# Patient Record
Sex: Male | Born: 1960
Health system: Southern US, Community
[De-identification: ages and names within clinical notes are randomized; demographics above are authoritative.]

## PROBLEM LIST (undated history)

## (undated) DIAGNOSIS — K219 Gastro-esophageal reflux disease without esophagitis: Secondary | ICD-10-CM

## (undated) DIAGNOSIS — M654 Radial styloid tenosynovitis [de Quervain]: Secondary | ICD-10-CM

## (undated) DIAGNOSIS — G43909 Migraine, unspecified, not intractable, without status migrainosus: Secondary | ICD-10-CM

## (undated) DIAGNOSIS — I739 Peripheral vascular disease, unspecified: Secondary | ICD-10-CM

## (undated) DIAGNOSIS — E785 Hyperlipidemia, unspecified: Secondary | ICD-10-CM

## (undated) DIAGNOSIS — N529 Male erectile dysfunction, unspecified: Secondary | ICD-10-CM

## (undated) DIAGNOSIS — I10 Essential (primary) hypertension: Secondary | ICD-10-CM

## (undated) DIAGNOSIS — S42302A Unspecified fracture of shaft of humerus, left arm, initial encounter for closed fracture: Secondary | ICD-10-CM

## (undated) DIAGNOSIS — G4730 Sleep apnea, unspecified: Secondary | ICD-10-CM

## (undated) HISTORY — DX: Unspecified fracture of shaft of humerus, left arm, initial encounter for closed fracture: S42.302A

## (undated) HISTORY — DX: Male erectile dysfunction, unspecified: N52.9

## (undated) HISTORY — DX: Migraine, unspecified, not intractable, without status migrainosus: G43.909

## (undated) HISTORY — DX: Hyperlipidemia, unspecified: E78.5

## (undated) HISTORY — DX: Sleep apnea, unspecified: G47.30

## (undated) HISTORY — DX: Radial styloid tenosynovitis (de quervain): M65.4

## (undated) HISTORY — PX: FRACTURE SURGERY: SHX138

## (undated) HISTORY — DX: Essential (primary) hypertension: I10

---

## 1998-08-11 ENCOUNTER — Encounter: Payer: Self-pay | Admitting: Internal Medicine

## 2002-04-07 HISTORY — PX: CARPAL TUNNEL RELEASE: SHX101

## 2002-08-07 HISTORY — PX: WRIST SURGERY: SHX841

## 2004-05-20 ENCOUNTER — Other Ambulatory Visit: Payer: Self-pay

## 2004-05-23 ENCOUNTER — Ambulatory Visit: Payer: Self-pay | Admitting: Orthopaedic Surgery

## 2004-06-13 ENCOUNTER — Ambulatory Visit: Payer: Self-pay | Admitting: Internal Medicine

## 2004-08-10 ENCOUNTER — Ambulatory Visit: Payer: Self-pay | Admitting: Family Medicine

## 2004-08-23 ENCOUNTER — Ambulatory Visit: Payer: Self-pay | Admitting: Internal Medicine

## 2004-08-24 ENCOUNTER — Ambulatory Visit: Payer: Self-pay | Admitting: Internal Medicine

## 2004-12-05 HISTORY — PX: OTHER SURGICAL HISTORY: SHX169

## 2004-12-09 ENCOUNTER — Ambulatory Visit: Payer: Self-pay | Admitting: Internal Medicine

## 2004-12-12 ENCOUNTER — Ambulatory Visit: Payer: Self-pay

## 2005-05-17 ENCOUNTER — Ambulatory Visit: Payer: Self-pay | Admitting: Internal Medicine

## 2005-05-30 ENCOUNTER — Ambulatory Visit: Payer: Self-pay | Admitting: Internal Medicine

## 2005-09-19 ENCOUNTER — Ambulatory Visit: Payer: Self-pay | Admitting: Family Medicine

## 2005-12-14 ENCOUNTER — Ambulatory Visit: Payer: Self-pay | Admitting: Internal Medicine

## 2005-12-22 ENCOUNTER — Encounter: Payer: Self-pay | Admitting: Internal Medicine

## 2006-08-07 HISTORY — PX: WRIST SURGERY: SHX841

## 2006-08-28 ENCOUNTER — Emergency Department: Payer: Self-pay | Admitting: Emergency Medicine

## 2006-08-29 ENCOUNTER — Ambulatory Visit: Payer: Self-pay | Admitting: Orthopaedic Surgery

## 2006-08-29 ENCOUNTER — Other Ambulatory Visit: Payer: Self-pay

## 2006-08-30 ENCOUNTER — Ambulatory Visit: Payer: Self-pay | Admitting: Orthopaedic Surgery

## 2006-10-17 ENCOUNTER — Inpatient Hospital Stay (HOSPITAL_COMMUNITY): Admission: RE | Admit: 2006-10-17 | Discharge: 2006-10-20 | Payer: Self-pay | Admitting: Orthopedic Surgery

## 2006-12-12 ENCOUNTER — Encounter: Payer: Self-pay | Admitting: Internal Medicine

## 2007-02-06 ENCOUNTER — Encounter: Payer: Self-pay | Admitting: Internal Medicine

## 2007-02-15 ENCOUNTER — Telehealth (INDEPENDENT_AMBULATORY_CARE_PROVIDER_SITE_OTHER): Payer: Self-pay | Admitting: *Deleted

## 2007-02-15 ENCOUNTER — Ambulatory Visit: Payer: Self-pay | Admitting: Internal Medicine

## 2007-02-15 DIAGNOSIS — J019 Acute sinusitis, unspecified: Secondary | ICD-10-CM

## 2007-03-01 ENCOUNTER — Encounter: Payer: Self-pay | Admitting: Internal Medicine

## 2007-05-15 ENCOUNTER — Ambulatory Visit: Payer: Self-pay | Admitting: Internal Medicine

## 2007-05-20 ENCOUNTER — Telehealth (INDEPENDENT_AMBULATORY_CARE_PROVIDER_SITE_OTHER): Payer: Self-pay | Admitting: *Deleted

## 2007-05-20 DIAGNOSIS — E785 Hyperlipidemia, unspecified: Secondary | ICD-10-CM | POA: Insufficient documentation

## 2007-05-20 DIAGNOSIS — J309 Allergic rhinitis, unspecified: Secondary | ICD-10-CM | POA: Insufficient documentation

## 2007-05-20 DIAGNOSIS — I10 Essential (primary) hypertension: Secondary | ICD-10-CM | POA: Insufficient documentation

## 2007-05-20 DIAGNOSIS — G43909 Migraine, unspecified, not intractable, without status migrainosus: Secondary | ICD-10-CM | POA: Insufficient documentation

## 2007-05-20 DIAGNOSIS — G4733 Obstructive sleep apnea (adult) (pediatric): Secondary | ICD-10-CM | POA: Insufficient documentation

## 2007-06-07 ENCOUNTER — Ambulatory Visit: Payer: Self-pay | Admitting: Internal Medicine

## 2007-06-07 DIAGNOSIS — N529 Male erectile dysfunction, unspecified: Secondary | ICD-10-CM

## 2007-06-12 ENCOUNTER — Ambulatory Visit: Payer: Self-pay | Admitting: Internal Medicine

## 2007-06-12 ENCOUNTER — Telehealth: Payer: Self-pay | Admitting: Internal Medicine

## 2007-06-12 DIAGNOSIS — R7989 Other specified abnormal findings of blood chemistry: Secondary | ICD-10-CM | POA: Insufficient documentation

## 2007-06-13 ENCOUNTER — Encounter: Payer: Self-pay | Admitting: Internal Medicine

## 2007-06-19 ENCOUNTER — Ambulatory Visit: Payer: Self-pay | Admitting: Internal Medicine

## 2007-06-27 ENCOUNTER — Telehealth: Payer: Self-pay | Admitting: Internal Medicine

## 2007-06-27 ENCOUNTER — Encounter: Payer: Self-pay | Admitting: Internal Medicine

## 2007-06-27 ENCOUNTER — Ambulatory Visit: Payer: Self-pay | Admitting: Internal Medicine

## 2007-07-01 ENCOUNTER — Telehealth (INDEPENDENT_AMBULATORY_CARE_PROVIDER_SITE_OTHER): Payer: Self-pay | Admitting: *Deleted

## 2007-07-11 ENCOUNTER — Telehealth (INDEPENDENT_AMBULATORY_CARE_PROVIDER_SITE_OTHER): Payer: Self-pay | Admitting: *Deleted

## 2007-08-09 ENCOUNTER — Encounter: Payer: Self-pay | Admitting: Internal Medicine

## 2007-10-23 ENCOUNTER — Ambulatory Visit: Payer: Self-pay | Admitting: Internal Medicine

## 2008-03-02 ENCOUNTER — Telehealth (INDEPENDENT_AMBULATORY_CARE_PROVIDER_SITE_OTHER): Payer: Self-pay | Admitting: *Deleted

## 2008-09-25 ENCOUNTER — Telehealth: Payer: Self-pay | Admitting: Family Medicine

## 2008-09-29 ENCOUNTER — Telehealth: Payer: Self-pay | Admitting: Internal Medicine

## 2008-09-29 ENCOUNTER — Ambulatory Visit: Payer: Self-pay | Admitting: Internal Medicine

## 2008-11-18 ENCOUNTER — Encounter: Payer: Self-pay | Admitting: Internal Medicine

## 2008-12-14 ENCOUNTER — Ambulatory Visit: Payer: Self-pay | Admitting: Internal Medicine

## 2008-12-16 ENCOUNTER — Telehealth: Payer: Self-pay | Admitting: Internal Medicine

## 2008-12-30 ENCOUNTER — Telehealth: Payer: Self-pay | Admitting: Internal Medicine

## 2009-08-24 ENCOUNTER — Telehealth: Payer: Self-pay | Admitting: Internal Medicine

## 2009-09-03 ENCOUNTER — Telehealth: Payer: Self-pay | Admitting: Internal Medicine

## 2009-09-03 ENCOUNTER — Ambulatory Visit: Payer: Self-pay | Admitting: Internal Medicine

## 2009-09-23 ENCOUNTER — Telehealth: Payer: Self-pay | Admitting: Internal Medicine

## 2010-01-11 ENCOUNTER — Ambulatory Visit: Payer: Self-pay | Admitting: Family Medicine

## 2010-01-26 ENCOUNTER — Telehealth: Payer: Self-pay | Admitting: Internal Medicine

## 2010-05-12 ENCOUNTER — Ambulatory Visit: Payer: Self-pay | Admitting: Internal Medicine

## 2010-05-12 LAB — HM DIABETES FOOT EXAM

## 2010-08-31 ENCOUNTER — Encounter: Payer: Self-pay | Admitting: Internal Medicine

## 2010-08-31 ENCOUNTER — Ambulatory Visit
Admission: RE | Admit: 2010-08-31 | Discharge: 2010-08-31 | Payer: Self-pay | Source: Home / Self Care | Attending: Internal Medicine | Admitting: Internal Medicine

## 2010-08-31 DIAGNOSIS — E291 Testicular hypofunction: Secondary | ICD-10-CM | POA: Insufficient documentation

## 2010-09-05 ENCOUNTER — Ambulatory Visit
Admission: RE | Admit: 2010-09-05 | Discharge: 2010-09-05 | Payer: Self-pay | Source: Home / Self Care | Attending: Family Medicine | Admitting: Family Medicine

## 2010-09-06 ENCOUNTER — Telehealth: Payer: Self-pay | Admitting: Family Medicine

## 2010-09-07 NOTE — Progress Notes (Signed)
Summary: cough, head and chest congestion  Phone Note Call from Patient Call back at [phone] c or pt's [phone]   Caller: Spouse Call For: [name] MD Summary of Call: Started 08/31/09 with productive cough with yellow phlegm, head and chest congestion. Pt cannot sleep at night due to congestion and cough. No shortness of breath, no fever, no wheezing, no sorethroat or earache. Been taking Mucines and OTC sinus and cold med which does not seem to be helping. Pt is also using cool air humidifier. Pt uses [facility] [phone]. Pt made appt to see Dr Dayton Martes on Mon 09/06/09@ 9:00am. Pt to call back if symptoms worsen or change prior to appt and sending note to Dr. Alphonsus Sias to see if any other suggestions until pt seen on 09/06/09. If pt does not hear back pt will keep appt on 09/06/09.Please advise.  Initial call taken by: Lewanda Rife LPN,  September 03, 2009 12:32 PM  Follow-up for Phone Call        can't phone in antibiotics Okay to have him come in at [facility] today if she doesn't think he can wait Follow-up by: [name] MD,  September 03, 2009 1:53 PM  Additional Follow-up for Phone Call Additional follow up Details #1::        pt coming at 3:00 Additional Follow-up by: Mervin Hack CMA Duncan Dull),  September 03, 2009 2:12 PM

## 2010-09-07 NOTE — Assessment & Plan Note (Signed)
Summary: TICK BITE   Vital Signs:  Patient profile:   50 year old male Height:      68 inches Weight:      208.6 pounds BMI:     31.83 Temp:     98.3 degrees F oral Pulse rate:   100 / minute Pulse rhythm:   regular BP sitting:   120 / 76  (left arm) Cuff size:   large  Vitals Entered By: Benny Lennert CMA Duncan Dull) (January 11, 2010 8:05 AM)  History of Present Illness: Chief complaint tick bite   5 days ago noted tick on right shoulder blade. Not sure how long it was attached...Marland Kitchenat least over night. Not sure what kind. Area remains red, gradually improveing.  No fever, no joint pain, no neck stiffness, no headache.  No rash.  Problems Prior to Update: 1)  Diabetes Mellitus, Type II  (ICD-250.00) 2)  Other Abnormal Blood Chemistry  (ICD-790.6) 3)  Erectile Dysfunction, Organic  (ICD-607.84) 4)  Preventive Health Care  (ICD-V70.0) 5)  Sleep Apnea  (ICD-780.57) 6)  Migraine Headache  (ICD-346.90) 7)  Hypertension  (ICD-401.9) 8)  Hyperlipidemia  (ICD-272.4) 9)  Allergic Rhinitis  (ICD-477.9) 10)  Sinusitis, Acute  (ICD-461.9)  Current Medications (verified): 1)  Lisinopril 20 Mg  Tabs (Lisinopril) .... Take 1 Tablet By Mouth Once A Day 2)  Maxalt 10 Mg  Tabs (Rizatriptan Benzoate) .... Prn 3)  Metformin Hcl 1000 Mg  Tabs (Metformin Hcl) .... Two Times A Day 4)  Onetouch Ultra Test   Strp (Glucose Blood) .... Use To Test Blood Sugar Bid 5)  Onetouch Ultrasoft Lancets   Misc (Lancets) .... Use To Test Blood Sugar Bid 6)  Alcohol Wipes 70 %  Pads (Alcohol Swabs) .... Use Daily As Directed When Checking Bs 7)  Cialis 20 Mg Tabs (Tadalafil) .... 1/2 - 1 Tab About 1 Hour Before Sex 8)  Glipizide Xl 5 Mg Xr24h-Tab (Glipizide) .... Take 1 Tablet By Mouth Once Daily  Allergies: 1)  ! Ziac (Bisoprolol-Hydrochlorothiazide)  Past History:  Past medical, surgical, family and social histories (including risk factors) reviewed, and no changes noted (except as noted below).  Past  Medical History: Reviewed history from 06/19/2007 and no changes required. Allergic rhinitis Hyperlipidemia Hypertension Migraines Sleep apnea Erectile dysfunction Diabetes mellitus, type II  Past Surgical History: Reviewed history from 05/20/2007 and no changes required. Left arm fx as a child DeQuervain's release - right wrist 01/04 Left carpal tunnel realse 09/03 Cardiolite negative EF 66%, 05/06 ORIF left wrist DVR plate screwhead autologous 01/08  Family History: Reviewed history from 06/07/2007 and no changes required. HTN in Dad and Carmelina Dane Dad died of Non-Hodgkin's lymphoma Pat aunt deid of lung cancer CAD in pat uncles Colon cancer in 1 relative (?aunt) Mat GM died of uterine cancer  Social History: Reviewed history from 05/20/2007 and no changes required. Occupation: Database administrator Married Never Smoked Alcohol use-rare  Review of Systems General:  Denies fatigue and fever. CV:  Denies chest pain or discomfort. Resp:  Denies shortness of breath. GI:  Denies abdominal pain. GU:  Denies dysuria.  Physical Exam  General:  Well-developed,well-nourished,in no acute distress; alert,appropriate and cooperative throughout examination Mouth:  Oral mucosa and oropharynx without lesions or exudates.  Teeth in good repair. Neck:  no carotid bruit or thyromegaly no cervical or supraclavicular lymphadenopathy  Lungs:  Normal respiratory effort, chest expands symmetrically. Lungs are clear to auscultation, no crackles or wheezes. Heart:  Normal rate and  regular rhythm. S1 and S2 normal without gallop, murmur, click, rub or other extra sounds. Abdomen:  Bowel sounds positive,abdomen soft and non-tender without masses, organomegaly or hernias noted. Skin:  mild erytthema aorund healing bite..no pus. No other rash, no bulls eye lesion   Impression & Recommendations:  Problem # 1:  TICK BITE (ICD-E906.4) Expected allergic response and local inflammatory  reaction...no suggestion of infection in this diabeic. No s/s of tick borne illness. Reviewed s/s..for pt to be aware of..return if any occur.   Complete Medication List: 1)  Lisinopril 20 Mg Tabs (Lisinopril) .... Take 1 tablet by mouth once a day 2)  Maxalt 10 Mg Tabs (Rizatriptan benzoate) .... Prn 3)  Metformin Hcl 1000 Mg Tabs (Metformin hcl) .... Two times a day 4)  Onetouch Ultra Test Strp (Glucose blood) .... Use to test blood sugar bid 5)  Onetouch Ultrasoft Lancets Misc (Lancets) .... Use to test blood sugar bid 6)  Alcohol Wipes 70 % Pads (Alcohol swabs) .... Use daily as directed when checking bs 7)  Cialis 20 Mg Tabs (Tadalafil) .... 1/2 - 1 tab about 1 hour before sex 8)  Glipizide Xl 5 Mg Xr24h-tab (Glipizide) .... Take 1 tablet by mouth once daily  Current Allergies (reviewed today): ! ZIAC (BISOPROLOL-HYDROCHLOROTHIAZIDE)

## 2010-09-07 NOTE — Progress Notes (Signed)
Summary: Needs appt for refills  Phone Note Outgoing Call Call back at Home Phone [phone]   Call placed by: DeShannon Katrinka Blazing CMA Duncan Dull),  January 26, 2010 9:34 AM Call placed to: Patient Summary of Call: calling pt to advise that he needs appt for refills. Per last office visit, pt should have followed up in . Initial call taken by: Mervin Hack CMA Duncan Dull),  January 26, 2010 9:34 AM  Follow-up for Phone Call        left message on machine that pt needs appt for refills. Follow-up by: Mervin Hack CMA Duncan Dull),  January 26, 2010 9:35 AM

## 2010-09-07 NOTE — Assessment & Plan Note (Signed)
Summary: cold/ds   Vital Signs:  Patient profile:   50 year old male Weight:      210 pounds O2 Sat:      96 % on Room air Temp:     98.4 degrees F oral Pulse rate:   108 / minute Pulse rhythm:   regular Resp:     18 per minute BP sitting:   120 / 80  (left arm) Cuff size:   large  Vitals Entered By: Mervin Hack CMA Duncan Dull) (September 03, 2009 3:13 PM)  O2 Flow:  Room air CC: cold/ congestion   History of Present Illness: has been sick for about 3-4 days Quickly worsening Bad nasal congestion and drainage Lots of cough productive of clear to yellow phlegm Sneezing  No clear fever Feels bad and hard time even thinking No SOB No sore throat  Has head pressure No sig ear pain  Had flu shot no sig aches  Allergies: 1)  ! Ziac (Bisoprolol-Hydrochlorothiazide)  Past History:  Past medical, surgical, family and social histories (including risk factors) reviewed for relevance to current acute and chronic problems.  Past Medical History: Reviewed history from 06/19/2007 and no changes required. Allergic rhinitis Hyperlipidemia Hypertension Migraines Sleep apnea Erectile dysfunction Diabetes mellitus, type II  Past Surgical History: Reviewed history from 05/20/2007 and no changes required. Left arm fx as a child DeQuervain's release - right wrist 01/04 Left carpal tunnel realse 09/03 Cardiolite negative EF 66%, 05/06 ORIF left wrist DVR plate screwhead autologous 01/08  Family History: Reviewed history from 06/07/2007 and no changes required. HTN in Dad and Carmelina Dane Dad died of Non-Hodgkin's lymphoma Pat aunt deid of lung cancer CAD in pat uncles Colon cancer in 1 relative (?aunt) Mat GM died of uterine cancer  Social History: Reviewed history from 05/20/2007 and no changes required. 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  Looks uncomfortable but NAD Head:  no frontal or maxillary tenderness Ears:  R ear normal and L ear normal.   Nose:  marked congestion on right, mod inflammation on left Mouth:  no erythema and no exudates.   Neck:  supple, no masses, and no cervical lymphadenopathy.   Lungs:  normal respiratory effort, no intercostal retractions, no accessory muscle use, and normal breath sounds.     Impression & Recommendations:  Problem # 1:  SINUSITIS, ACUTE (ICD-461.9) Assessment Comment Only  Has sinus infection but likely viral will continue analgesics tramadol for cough start amoxil if worsens  His updated medication list for this problem includes:    Amoxicillin 500 Mg Tabs (Amoxicillin) .Marland Kitchen... 2 tabs by mouth two times a day for sinus infection  Complete Medication List: 1)  Lisinopril 20 Mg Tabs (Lisinopril) .... Take 1 tablet by mouth once a day 2)  Maxalt 10 Mg Tabs (Rizatriptan benzoate) .... Prn 3)  Metformin Hcl 1000 Mg Tabs (Metformin hcl) .... Two times a day 4)  Onetouch Ultra Test Strp (Glucose blood) .... Use to test blood sugar bid 5)  Onetouch Ultrasoft Lancets Misc (Lancets) .... Use to test blood sugar bid 6)  Alcohol Wipes 70 % Pads (Alcohol swabs) .... Use daily as directed when checking bs 7)  Cialis 20 Mg Tabs (Tadalafil) .... 1/2 - 1 tab about 1 hour before sex 8)  Glipizide  Xl 5 Mg Xr24h-tab (Glipizide) .... Take 1 tablet by mouth once daily 9)  Tramadol Hcl 50 Mg Tabs (Tramadol hcl) .Marland Kitchen.. 1 tab by mouth three times a day as needed for severe cough 10)  Amoxicillin 500 Mg Tabs (Amoxicillin) .... 2 tabs by mouth two times a day for sinus infection  Patient Instructions: 1)  Keep regular follow up 2)  Please start the amoxicillin if more nasty nasal drainage or you are worsening overall Prescriptions: AMOXICILLIN 500 MG TABS (AMOXICILLIN) 2 tabs by mouth two times a day for sinus infection  #40 x 0   Entered  and Authorized by:   [name] MD   Signed by:   [name] MD on 09/03/2009   Method used:   Print then Give to Patient   RxID:   [id number] TRAMADOL HCL 50 MG TABS (TRAMADOL HCL) 1 tab by mouth three times a day as needed for severe cough  #40 x 0   Entered and Authorized by:   [name] MD   Signed by:   [name] MD on 09/03/2009   Method used:   Electronically to        [facility] [address] (retail)       [address], [address]       [place], [place]  [address]       Ph: [phone]       Fax: [phone]   RxID:   [id number]   Current Allergies (reviewed today): ! ZIAC (BISOPROLOL-HYDROCHLOROTHIAZIDE)

## 2010-09-07 NOTE — Progress Notes (Signed)
Summary: MAXALT  Phone Note Refill Request Message from:  [facility] [phone] [facility][id number] on September 23, 2009 3:41 PM  Refills Requested: Medication #1:  MAXALT 10 MG  TABS prn E-Scribe Request no last refill date sent   Method Requested: Electronic Initial call taken by: Mervin Hack CMA Duncan Dull),  September 23, 2009 3:41 PM  Follow-up for Phone Call        okay #12  x 3 Follow-up by: [name] MD,  September 23, 2009 5:36 PM  Additional Follow-up for Phone Call Additional follow up Details #1::        Rx faxed to pharmacy Additional Follow-up by: DeShannon Smith CMA Duncan Dull),  September 24, 2009 8:02 AM    Prescriptions: MAXALT 10 MG  TABS (RIZATRIPTAN BENZOATE) prn  #12 Tablet x 3   Entered by:   Mervin Hack CMA (AAMA)   Authorized by:   [name] MD   Signed by:   Mervin Hack CMA (AAMA) on 09/24/2009   Method used:   [facility]ronically to        [phone][facility] (retail)       [address] [place], [place]  [address]       Ph: [phone] or [phone]       Fax: [phone]   RxID:   [id number]

## 2010-09-07 NOTE — Progress Notes (Signed)
Summary: needs written scripsts  Phone Note Refill Request Call back at Home Phone [phone] Message from:  Patient on August 24, 2009 11:44 AM  Refills Requested: Medication #1:  METFORMIN HCL 1000 MG  TABS two times a day  Medication #2:  LISINOPRIL 20 MG  TABS Take 1 tablet by mouth once a day  Medication #3:  GLIPIZIDE XL 5 MG XR24H-TAB take 1 tablet by mouth once daily. Patient needs 90 day written prescriptions for his mail order pharmacy. Please call patient when ready for pick up.   Method Requested: Pick up at Office Initial call taken by: Melody Comas,  August 24, 2009 11:46 AM Caller: Patient Call For: [name] MD  Follow-up for Phone Call        left message on machine for patient to return call. Per Dr. Alphonsus Sias pt needs to schedule follow-up appt. Pt can have 1 refill enough to last until appt. DeShannon Smith CMA Duncan Dull)  August 24, 2009 4:44 PM   spoke with patient and advised he needs follow-up appt and pt states he will call back and schedule. DeShannon Smith CMA Duncan Dull)  August 26, 2009 5:08 PM

## 2010-09-07 NOTE — Assessment & Plan Note (Signed)
Summary: CPX/CLE   Vital Signs:  Patient profile:   50 year old male Height:      66 inches Weight:      208 pounds Temp:     98.2 degrees F oral Pulse rate:   76 / minute Pulse rhythm:   regular BP sitting:   100 / 70  (left arm) Cuff size:   large  Vitals Entered By: Mervin Hack CMA Duncan Dull) (May 12, 2010 2:08 PM) CC: adult physical   History of Present Illness: Here for physical  concerned that his A1c is back up again Did have it done at work after starting glipizide and it was better (he doesn't remember the numbers) Fasting sugars creeping up to 160-180 No other checks No hypoglycemic reactions  Compliance with diet is not great but not that bad, he feels Very little exercise--- he isn't motivated for this discussed the probablility that he will need more meds if he doesn't change  overall, he feels good disturbed by ED---cialis not helping much  Allergies: 1)  ! Ziac (Bisoprolol-Hydrochlorothiazide)  Past History:  Past medical, surgical, family and social histories (including risk factors) reviewed for relevance to current acute and chronic problems.  Past Medical History: Reviewed history from 06/19/2007 and no changes required. Allergic rhinitis Hyperlipidemia Hypertension Migraines Sleep apnea Erectile dysfunction Diabetes mellitus, type II  Past Surgical History: Reviewed history from 05/20/2007 and no changes required. Left arm fx as a child DeQuervain's release - right wrist 01/04 Left carpal tunnel realse 09/03 Cardiolite negative EF 66%, 05/06 ORIF left wrist DVR plate screwhead autologous 01/08  Family History: Reviewed history from 06/07/2007 and no changes required. HTN in Dad and Carmelina Dane Dad died of Non-Hodgkin's lymphoma Pat aunt deid of lung cancer CAD in pat uncles Colon cancer in 1 relative (?aunt) Mat GM died of uterine cancer  Social History: Reviewed history from 05/20/2007 and no changes required. 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 Eyes:  Denies double vision and vision loss-1 eye. ENT:  Denies decreased hearing and ringing in ears; teeth okay--regular with dentist. CV:  Complains of shortness of breath with exertion; denies chest pain or discomfort, difficulty breathing at night, difficulty breathing while lying down, fainting, lightheadness, and palpitations; stable DOE. Resp:  Denies cough and shortness of breath. GI:  Denies abdominal pain, bloody stools, change in bowel habits, dark tarry stools, indigestion, nausea, and vomiting. GU:  Complains of erectile dysfunction; denies urinary frequency and urinary hesitancy; only occ nocturia. MS:  Denies joint pain and joint swelling. Derm:  Complains of dryness; denies lesion(s) and rash. Neuro:  Complains of headaches; denies numbness, tingling, and weakness; occ headaches--better than in the past. Psych:  Denies anxiety and depression. Heme:  Denies abnormal bruising and enlarge lymph nodes. Allergy:  Complains of seasonal allergies and sneezing; mild allergies ---no meds.  Physical Exam  General:  alert and normal appearance.   Eyes:  pupils equal, pupils round, pupils reactive to light, and no optic disk abnormalities.   Ears:  R ear normal and L ear normal.   Mouth:  no erythema, no exudates, and no lesions.   Neck:  supple, no masses, no thyromegaly, no carotid bruits, and no cervical lymphadenopathy.   Lungs:  normal respiratory effort, no intercostal retractions, no accessory muscle use, and normal breath sounds.   Heart:  normal rate, regular rhythm, no murmur, and  no gallop.   Abdomen:  soft and non-tender.   Msk:  no joint tenderness and no joint swelling.   Pulses:  1+ in feet Extremities:  no edema Neurologic:  alert & oriented X3, strength normal in all extremities, and gait normal.   Skin:  no  rashes and no suspicious lesions.   Axillary Nodes:  No palpable lymphadenopathy Psych:  normally interactive, good eye contact, not anxious appearing, and not depressed appearing.    Diabetes Management Exam:    Foot Exam (with socks and/or shoes not present):       Sensory-Pinprick/Light touch:          Left medial foot (L-4): normal          Left dorsal foot (L-5): normal          Left lateral foot (S-1): normal          Right medial foot (L-4): normal          Right dorsal foot (L-5): normal          Right lateral foot (S-1): normal       Inspection:          Left foot: normal          Right foot: normal       Nails:          Left foot: normal          Right foot: normal    Eye Exam:       Eye Exam done elsewhere          Date: 09/07/2009          Results: no retinopathy          Done by: Dr Eldridge Abrahams   Impression & Recommendations:  Problem # 1:  PREVENTIVE HEALTH CARE (ICD-V70.0) Assessment Comment Only will get flu shot at [facility] cancer screening starting at 50 needs to work on fitness  Problem # 2:  DIABETES MELLITUS, TYPE II (ICD-250.00) Assessment: Deteriorated last A1c 8.3% May need more meds---he prefers pills Alma Friendly) discussed fitness  His updated medication list for this problem includes:    Lisinopril 20 Mg Tabs (Lisinopril) .Marland Kitchen... Take 1 tablet by mouth once a day    Metformin Hcl 1000 Mg Tabs (Metformin hcl) .Marland Kitchen..Marland Kitchen Two times a day    Glipizide Xl 5 Mg Xr24h-tab (Glipizide) .Marland Kitchen... Take 1 tablet by mouth once daily  Problem # 3:  HYPERTENSION (ICD-401.9) Assessment: Unchanged good control  His updated medication list for this problem includes:    Lisinopril 20 Mg Tabs (Lisinopril) .Marland Kitchen... Take 1 tablet by mouth once a day  BP today: 100/70 Prior BP: 120/76 (01/11/2010)  Problem # 4:  HYPERLIPIDEMIA (ICD-272.4) Assessment: Comment Only LDL last  ~128 LFTs mildly abnormal no statin for now  Complete Medication List: 1)  Lisinopril 20 Mg Tabs  (Lisinopril) .... Take 1 tablet by mouth once a day 2)  Metformin Hcl 1000 Mg Tabs (Metformin hcl) .... Two times a day 3)  Glipizide Xl 5 Mg Xr24h-tab (Glipizide) .... Take 1 tablet by mouth once daily 4)  Cialis 20 Mg Tabs (Tadalafil) .... 1/2 - 1 tab about 1 hour before sex 5)  Maxalt 10 Mg Tabs (Rizatriptan benzoate) .... As needed 6)  Onetouch Ultra Test Strp (Glucose blood) .... Use to test blood sugar two times a day dx:250.00 7)  Onetouch Ultrasoft Lancets Misc (Lancets) .... Use to test blood sugar two times a day dx:250.00 8)  Alcohol  Wipes 70 % Pads (Alcohol swabs) .... Use daily as directed when checking bs  Patient Instructions: 1)  Please schedule a follow-up appointment in 6 months .  2)  Labs today to [facility]: 3)  HgbA1c, urine microal  250.00 4)  free testosterone  607.84 5)  lipid, hepatic   272.4 6)  CBC with diff, TSH, renal   401.9 Prescriptions: ONETOUCH ULTRASOFT LANCETS   MISC (LANCETS) use to test blood sugar two times a day dx:250.00  #200 x 3   Entered by:   Mervin Hack CMA (AAMA)   Authorized by:   [name] MD   Signed by:   Mervin Hack CMA (AAMA) on 05/12/2010   Method used:   Print then Give to Patient   RxID:   [id number] ONETOUCH ULTRA TEST   STRP (GLUCOSE BLOOD) use to test blood sugar two times a day dx:250.00  #200 x 3   Entered by:   Mervin Hack CMA (AAMA)   Authorized by:   [name] MD   Signed by:   Mervin Hack CMA (AAMA) on 05/12/2010   Method used:   Print then Give to Patient   RxID:   [id number] GLIPIZIDE XL 5 MG XR24H-TAB (GLIPIZIDE) take 1 tablet by mouth once daily  #90 x 3   Entered by:   Mervin Hack CMA (AAMA)   Authorized by:   [name] MD   Signed by:   Mervin Hack CMA (AAMA) on 05/12/2010   Method used:   Print then Give to Patient   RxID:   [id number] ALCOHOL WIPES 70 %  PADS (ALCOHOL SWABS) use daily as directed when checking bs  #200 x 3   Entered by:    Mervin Hack CMA (AAMA)   Authorized by:   [name] MD   Signed by:   Mervin Hack CMA (AAMA) on 05/12/2010   Method used:   Print then Give to Patient   RxID:   [id number] METFORMIN HCL 1000 MG  TABS (METFORMIN HCL) two times a day  #180 x 3   Entered by:   Mervin Hack CMA (AAMA)   Authorized by:   [name] MD   Signed by:   Mervin Hack CMA (AAMA) on 05/12/2010   Method used:   Print then Give to Patient   RxID:   [id number] LISINOPRIL 20 MG  TABS (LISINOPRIL) Take 1 tablet by mouth once a day  #90 x 3   Entered by:   Mervin Hack CMA (AAMA)   Authorized by:   [name] MD   Signed by:   Mervin Hack CMA (AAMA) on 05/12/2010   Method used:   Print then Give to Patient   RxID:   [id number]   Current Allergies (reviewed today): ! ZIAC (BISOPROLOL-HYDROCHLOROTHIAZIDE)

## 2010-09-08 NOTE — Assessment & Plan Note (Signed)
Summary: 8:15 3 MONTH FOLLOW UP   Vital Signs:  Patient profile:   50 year old male Weight:      204 pounds Temp:     98.9 degrees F oral Pulse rate:   91 / minute Pulse rhythm:   regular BP sitting:   119 / 78  (left arm) Cuff size:   large  Vitals Entered By: Mervin Hack CMA [facility] Dull) (August 31, 2010 8:12 AM) CC: 3 mth follow-up   History of Present Illness: Has been trying to improve  Going to gym for 45 minutes -- 3 times per week Does treadmill and elliptical Occ gets hip pain on treadmill  Never bad with diet Fairly careful most of the time  Fasting sugar 188 this AM Has been up every day  Very frustrated by ED Cialis not helping now Has tried the others as well and no better    Allergies: 1)  ! Ziac (Bisoprolol-Hydrochlorothiazide)  Past History:  Past medical, surgical, family and social histories (including risk factors) reviewed for relevance to current acute and chronic problems.  Past Medical History: Reviewed history from 06/19/2007 and no changes required. Allergic rhinitis Hyperlipidemia Hypertension Migraines Sleep apnea Erectile dysfunction Diabetes mellitus, type II  Past Surgical History: Reviewed history from 05/20/2007 and no changes required. Left arm fx as a child DeQuervain's release - right wrist 01/04 Left carpal tunnel realse 09/03 Cardiolite negative EF 66%, 05/06 ORIF left wrist DVR plate screwhead autologous 01/08  Family History: Reviewed history from 06/07/2007 and no changes required. HTN in Dad and Carmelina Dane Dad died of Non-Hodgkin's lymphoma Pat aunt deid of lung cancer CAD in pat uncles Colon cancer in 1 relative (?aunt) Mat GM died of uterine cancer  Social History: Reviewed history from 05/20/2007 and no changes required. Occupation: Database administrator Married Never Smoked Alcohol use-rare  Review of Systems       weight is down 4#   Impression & Recommendations:  Problem # 1:  DIABETES  MELLITUS, TYPE II (ICD-250.00) Assessment Improved  has improved with exercise still high fastings he is resistant to lantus will let him continue his lifestyle changes unless his A1c is higher Januvia if higher Counselled more than half of 15 minute visit  His updated medication list for this problem includes:    Lisinopril 20 Mg Tabs (Lisinopril) .Marland Kitchen... Take 1 tablet by mouth once a day    Metformin Hcl 1000 Mg Tabs (Metformin hcl) .Marland Kitchen..Marland Kitchen Two times a day    Glipizide Xl 5 Mg Xr24h-tab (Glipizide) .Marland Kitchen... Take 1 tablet by mouth once daily  Orders: Venipuncture (64403)  Problem # 2:  TESTICULAR HYPOFUNCTION (ICD-257.2) Assessment: New really bothered by impotence discussed options and concerns (like possibly increased risk of prostate cancer) will try testosterone supplement  Complete Medication List: 1)  Lisinopril 20 Mg Tabs (Lisinopril) .... Take 1 tablet by mouth once a day 2)  Metformin Hcl 1000 Mg Tabs (Metformin hcl) .... Two times a day 3)  Glipizide Xl 5 Mg Xr24h-tab (Glipizide) .... Take 1 tablet by mouth once daily 4)  Cialis 20 Mg Tabs (Tadalafil) .... 1/2 - 1 tab about 1 hour before sex 5)  Maxalt 10 Mg Tabs (Rizatriptan benzoate) .... As needed 6)  Onetouch Ultra Test Strp (Glucose blood) .... Use to test blood sugar two times a day dx:250.00 7)  Onetouch Ultrasoft Lancets Misc (Lancets) .... Use to test blood sugar two times a day dx:250.00 8)  Alcohol Wipes 70 % Pads (Alcohol swabs) .Marland KitchenMarland KitchenMarland Kitchen  Use daily as directed when checking bs 9)  Androgel 50 Mg/5gm Gel (Testosterone) .... Apply 5gm to skin daily as directed  Patient Instructions: 1)  Please schedule a follow-up appointment in 6 months .  2)  Please send HgbA1c to [facility] today Prescriptions: ANDROGEL 50 MG/5GM GEL (TESTOSTERONE) Apply 5gm to skin daily as directed  #1 month x 5   Entered and Authorized by:   [name] MD   Signed by:   [name] MD on 08/31/2010   Method used:   Print then Give  to Patient   RxID:   [id number]    Orders Added: 1)  Est. Patient Level III [56213] 2)  Venipuncture [08657]    Current Allergies (reviewed today): ! ZIAC (BISOPROLOL-HYDROCHLOROTHIAZIDE)  Appended Document: 8:15 3 MONTH FOLLOW UP

## 2010-09-14 NOTE — Assessment & Plan Note (Signed)
Summary: HEAD and CHEST CONGESTION and COUGH / LFW   Vital Signs:  Patient profile:   50 year old male Weight:      208.25 pounds O2 Sat:      94 % on Room air Temp:     98.5 degrees F oral Pulse rate:   92 / minute Pulse rhythm:   regular BP sitting:   110 / 70  (left arm) Cuff size:   large  Vitals Entered By: Selena Batten Dance CMA (AAMA) (September 05, 2010 3:32 PM)  O2 Flow:  Room air CC: Cough and congestion x2 weeks   History of Present Illness: CC: bad chest congestion  2 wk h/o bad head and chest congestion.  Tried amox had leftover at home x 4 days, mucinex, alka seltzer night/day, robitussin cough syrup.  Started 2 wks ago, chest tightness, cough productive of creamy sputum.  Some nasal congestion but not as much.  + HA.  body soreness.  cough worse at night, gets into coughing fits.  no sweating.  No ST, abd pain, n/v/d, rashes, myalgias, arthralgias, fevers/chills.  No sick contacts around him.  No smokers at home.  No h/o asthma.  Current Medications (verified): 1)  Lisinopril 20 Mg  Tabs (Lisinopril) .... Take 1 Tablet By Mouth Once A Day 2)  Metformin Hcl 1000 Mg  Tabs (Metformin Hcl) .... Two Times A Day 3)  Glipizide Xl 5 Mg Xr24h-Tab (Glipizide) .... Take 1 Tablet By Mouth Once Daily 4)  Cialis 20 Mg Tabs (Tadalafil) .... 1/2 - 1 Tab About 1 Hour Before Sex 5)  Maxalt 10 Mg  Tabs (Rizatriptan Benzoate) .... As Needed 6)  Onetouch Ultra Test   Strp (Glucose Blood) .... Use To Test Blood Sugar Two Times A Day Dx:250.00 7)  Onetouch Ultrasoft Lancets   Misc (Lancets) .... Use To Test Blood Sugar Two Times A Day Dx:250.00 8)  Alcohol Wipes 70 %  Pads (Alcohol Swabs) .... Use Daily As Directed When Checking Bs 9)  Androgel 50 Mg/5gm Gel (Testosterone) .... Apply 5gm To Skin Daily As Directed 10)  Januvia 100 Mg Tabs (Sitagliptin Phosphate) .... Take 1 By Mouth Once Daily  Allergies: 1)  ! Ziac (Bisoprolol-Hydrochlorothiazide)  Past History:  Past Medical  History: Last updated: 06/19/2007 Allergic rhinitis Hyperlipidemia Hypertension Migraines Sleep apnea Erectile dysfunction Diabetes mellitus, type II  Social History: Last updated: 05/20/2007 Occupation: [employer] Married Never Smoked Alcohol use-rare  Review of Systems       per HPI  Physical Exam  General:  alert and normal appearance.   Head:  no frontal or maxillary tenderness Eyes:  pupils equal, pupils round, pupils reactive to light Ears:  R ear normal and L ear normal.   Nose:  nares clear bilaterally Mouth:  no erythema, no exudates, and no lesions.   Neck:  supple, no masses, no thyromegaly, no carotid bruits, and no cervical lymphadenopathy.   Lungs:  normal respiratory effort, no intercostal retractions, no accessory muscle use, and normal breath sounds.   Heart:  normal rate, regular rhythm, no murmur, and no gallop.     Impression & Recommendations:  Problem # 1:  ACUTE BRONCHITIS (ICD-466.0) supportive care as per instructions.  zpack to take as going on for 2+ wks, treat possible atypicals in pt with comorbidities, recent self prescription of wife's left over abx.  His updated medication list for this problem includes:    Zithromax Z-pak 250 Mg Tabs (Azithromycin) ..... Use as directed  Tussionex Pennkinetic Er 10-8 Mg/66ml Lqcr (Hydrocod polst-chlorphen polst) ..... One teaspoon two times a day as needed cough, sedation precautions  Complete Medication List: 1)  Lisinopril 20 Mg Tabs (Lisinopril) .... Take 1 tablet by mouth once a day 2)  Metformin Hcl 1000 Mg Tabs (Metformin hcl) .... Two times a day 3)  Glipizide Xl 5 Mg Xr24h-tab (Glipizide) .... Take 1 tablet by mouth once daily 4)  Cialis 20 Mg Tabs (Tadalafil) .... 1/2 - 1 tab about 1 hour before sex 5)  Maxalt 10 Mg Tabs (Rizatriptan benzoate) .... As needed 6)  Onetouch Ultra Test Strp (Glucose blood) .... Use to test blood sugar two times a day dx:250.00 7)  Onetouch  Ultrasoft Lancets Misc (Lancets) .... Use to test blood sugar two times a day dx:250.00 8)  Alcohol Wipes 70 % Pads (Alcohol swabs) .... Use daily as directed when checking bs 9)  Androgel 50 Mg/5gm Gel (Testosterone) .... Apply 5gm to skin daily as directed 10)  Januvia 100 Mg Tabs (Sitagliptin phosphate) .... Take 1 by mouth once daily 11)  Zithromax Z-pak 250 Mg Tabs (Azithromycin) .... Use as directed 12)  Tussionex Pennkinetic Er 10-8 Mg/19ml Lqcr (Hydrocod polst-chlorphen polst) .... One teaspoon two times a day as needed cough, sedation precautions  Patient Instructions: 1)  Sounds like a bronchitis.  treat with course of azithromycin. 2)  Tussionex for cough, caution can make you sleepy. 3)  Try nasal saline spray or neti pot.   4)  Please return if you are not improving as expected, or if you have high fevers (>101.5) or difficulty swallowing. 5)  Call clinic with questions.  Pleasure to see you today Prescriptions: TUSSIONEX PENNKINETIC ER 10-8 MG/5ML LQCR (HYDROCOD POLST-CHLORPHEN POLST) one teaspoon two times a day as needed cough, sedation precautions  #100cc x 0   Entered and Authorized by:   Eustaquio Boyden  MD   Signed by:   Eustaquio Boyden  MD on 09/05/2010   Method used:   Print then Give to Patient   RxID:   [id number] Christena Deem Z-PAK 250 MG TABS (AZITHROMYCIN) use as directed  #1 x 0   Entered and Authorized by:   Eustaquio Boyden  MD   Signed by:   Eustaquio Boyden  MD on 09/05/2010   Method used:   [facility] to        [facility] [phone]* (retail)       [address] [place], [place]  [address]       Ph: [phone] or [phone]       Fax: [phone]   RxID:   [id number]    Orders Added: 1)  Est. Patient Level III [87564]    Current Allergies (reviewed today): ! ZIAC (BISOPROLOL-HYDROCHLOROTHIAZIDE)

## 2010-09-14 NOTE — Progress Notes (Signed)
Summary: cant take tussionex  Phone Note Call from Patient Call back at Work Phone [phone]   Caller: Patient Summary of Call: Pt didnt fill the tussionex script that he was given  yesterday because he has an allergy to codeine, it makes him itch.  He is asking if something else can be called to [facility] [address].              Lowella Petties CMA, AAMA  September 06, 2010 9:10 AM   Follow-up for Phone Call        added codeine to allergies.  change to benzonatate.  plz call. Follow-up by: Eustaquio Boyden  MD,  September 06, 2010 11:08 AM  Additional Follow-up for Phone Call Additional follow up Details #1::        Patient notified. Additional Follow-up by: Janee Morn CMA (AAMA),  September 06, 2010 11:11 AM   New Allergies: ! CODEINE New/Updated Medications: BENZONATATE 100 MG CAPS (BENZONATATE) take one by mouth three times a day as needed cough, swallow don't chew New Allergies: ! CODEINEPrescriptions: BENZONATATE 100 MG CAPS (BENZONATATE) take one by mouth three times a day as needed cough, swallow don't chew  #40 x 0   Entered and Authorized by:   Eustaquio Boyden  MD   Signed by:   Eustaquio Boyden  MD on 09/06/2010   Method used:   [facility] to        [facility] [address] (retail)       [address], [address]       [place], [place]  [address]       Ph: [phone]       Fax: [phone]   RxID:   [id number]

## 2010-10-14 ENCOUNTER — Telehealth: Payer: Self-pay | Admitting: Internal Medicine

## 2010-10-25 NOTE — Progress Notes (Signed)
Summary: Prior authorization  Phone Note Call from Patient Call back at Home Phone [phone]   Caller: Patient  [phone] Call For: [name] MD Summary of Call: Maxalt refill.  Insurance is requiring prior authorization.  [facility] [address]  [facility].  Form in your in box. Initial call taken by: Delilah Shan CMA [facility]),  October 14, 2010 1:36 PM  Follow-up for Phone Call        please find out what else he has used and if the insurance has another similar med they prefer [name] MD  October 17, 2010 1:19 PM   patient has only ever tried Maxalt, pt has tried all of the over the counter meds. Per wife they will check with insurance to see what is covered. DeShannon Smith CMA [facility])  October 17, 2010 3:28 PM   they may want him to try sumatriptan which is generic If so, okay to fill 100mg  1/2-1 at onset of migraine #9 x 3 [name] MD  October 17, 2010 3:38 PM   Left message on cell phone for patient to return call.  DeShannon Smith CMA [facility])  October 18, 2010 11:15 AM   Spoke with patient's wife and advised results. rx sent to pharmacy.  Follow-up by: Mervin Hack CMA [facility] Dull),  October 19, 2010 9:29 AM    New/Updated Medications: SUMATRIPTAN SUCCINATE 100 MG TABS (SUMATRIPTAN SUCCINATE) 1/2-1 at onset of migraine Prescriptions: SUMATRIPTAN SUCCINATE 100 MG TABS (SUMATRIPTAN SUCCINATE) 1/2-1 at onset of migraine  #9 x 3   Entered by:   Mervin Hack CMA (AAMA)   Authorized by:   [name] MD   Signed by:   Mervin Hack CMA (AAMA) on 10/19/2010   Method used:   [facility]        [facility] [phone]* (retail)       [address] [place], [place]  [address]       Ph: [phone] or [phone]       Fax: [phone]   RxID:   [id number]

## 2010-11-08 ENCOUNTER — Ambulatory Visit: Payer: Self-pay | Admitting: Internal Medicine

## 2010-11-10 ENCOUNTER — Other Ambulatory Visit: Payer: Self-pay | Admitting: *Deleted

## 2010-11-10 MED ORDER — SITAGLIPTIN PHOSPHATE 100 MG PO TABS: 100.0000 mg | ORAL_TABLET | Freq: Every day | ORAL | Status: DC

## 2010-11-15 ENCOUNTER — Other Ambulatory Visit: Payer: Self-pay | Admitting: Internal Medicine

## 2010-11-16 ENCOUNTER — Other Ambulatory Visit: Payer: Self-pay

## 2010-12-21 ENCOUNTER — Other Ambulatory Visit: Payer: Self-pay | Admitting: Internal Medicine

## 2010-12-23 NOTE — Discharge Summary (Signed)
NAME:  Calvin Smith, Calvin Smith NO.:  [id number]   MEDICAL RECORD NO.:  [id number]          PATIENT TYPE:  INP   LOCATION:  5007                         FACILITY:  [facility]   PHYSICIAN:  Dionne Ano. Gramig, M.D.DATE OF BIRTH:  Jun 28, 1961   DATE OF ADMISSION:  10/17/2006  DATE OF DISCHARGE:  10/20/2006                               DISCHARGE SUMMARY   ADMITTING DIAGNOSES:  1. 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  2. History of sleep apnea.  3. Intermittent of migraines.  4. History of exercise induced asthma.  5. Hypertension.   DISCHARGE DIAGNOSES:  1. 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  2. History of sleep apnea.  3. Intermittent of migraines.  4. History of exercise induced asthma.   SURGEON:  Dionne Ano. Amanda Pea, M.D.   PROCEDURE:  1. Hardware removal in left wrist.  2. Tenolysis left wrist.  3. Left wrist EPLD compression pin transfer.  4. Left brachioradialis tenotomy.  5. Stress radiography.  6. Open reduction internal fixation of malunion left wrist.  7. Iliac crest bone grafting from the left hip.  8. Allograft bone grafting.   CONSULTS:  None.   BRIEF HISTORY OF THE PRESENT ILLNESS:  Calvin Smith is a very pleasant 50-  year-old gentleman who sustained a prior distal radius fracture with  subsequent open reduction internal fixation from an outside practice.  He had continued pain difficulties and was referred to [facility] for evaluation by hand and upper extremity specialist,  Dr. Dionne Ano. Gramig, where he was noted to have a nascent malunion of  his left wrist with subsequent hardware failure.  The decision made,  given his findings, proceed with reconstruction as an elective  procedure.  Appropriate preoperative labs were obtained and the patient  was cleared for surgery.   LABORATORY DATA:  Included a hemogram, which was within normal  limits.  Routine chemistry, which was within normal limits other than a glucose  being slightly high at 109.  In addition, his AST was noted to be 45 and  ALT 74.  Chest x-ray showed normal cardiac silhouette, mediastinal  contours were within normal limits.  Visualized skeletal structures were  unremarkable with no active cardiopulmonary disease present.  Prior EKG  showed normal sinus rhythm, nonspecific T wave abnormality.   BRIEF HOSPITAL COURSE:  [name] underwent the above procedure on  October 17, 2006 without complications.  He tolerated this very well and  was admitted to the orthopedic unit with standard postoperative orders,  including pain management regimen, antibiotics and close observation,  including wound care elevation and therapeutic endeavors from  occupational and physical therapy.  A respiratory consult was obtained  for CPAP settings given his history of sleep apnea.  On postoperative  day number 1, he was doing fairly well.  He had somewhat of an increased  amount of pain not of the surgery, as well as nausea and vomiting early  that morning, but overall was improved with his medication regimen  on  postop day number 1.  He denied any fevers, chills, shortness of breath  or chest pain.  His vital signs were stable.  He was afebrile.  Slightly  tachycardic.  O2 saturation was noted to be 97%.  Left upper extremity  showed that the volar drain was removed without difficulty.  The dorsal  drains remained intact as there was not clots and this was still  actively draining.  His fingers had minimal edema.  Sensation was  intact.  Active range of motion was intact with minimal pain.  No signs  of compartment syndrome.  His hip incision was clean, dry and intact.  The patient continued IV antibiotic regimens and pain medications and  did very well postoperatively and on October 19, 2006, postoperative day  number 2, he was much improved.  The nausea was diminished.  He  was  voiding without difficulties, tolerating p.o.   Vital signs show a blood pressure of 152/85, temperature 97.3.  He  remained tachycardic at 124.  It was discussed, at length with the  patient, this was apparently not a new finding as he had been previously  seen and evaluated by his primary care physician.  The patient stated  that his family physician was knowledgeable of this, but that no  treatment had been implemented as he was relatively asymptomatic.  His  oxygen saturation was 91% at the time of the visit; however, with  encouragement on room air, he easily obtained 99%.  The patient was  noted to be somewhat sedated.  His chest was clear to auscultation  bilaterally.  Heart was S1, S2 tachycardic.  Abdomen:  Bowel sounds were  positive.  No guarding or rebound.  Splint was clean, dry and intact.  Extremities:  Moved dorsally without difficulty.  Range of motion  persists.  A neurovascular status was present.  Left hip remained  without infection.  Incision was clean, dry and intact.  The drain was  accidentally removed by the patient when ambulating in the hall earlier  in the morning hours.  His IV fluids were __________ well.  Strict  bedside incentive spirometry was ordered as the patient was noted  __________ sedated decreasing his respiratory drive.  Out of bed  ambulation to chair, etc. was encouraged.  Active and passive range of  motion of digits were encouraged with therapy.   The following day, on postoperative day number 3, he was doing well  overall.  Not having any difficulties and given his improved status and  the fact that he is tolerating p.o., voiding without difficulties and  not having to require IV pain medication, decision was made to discharge  him home.   ASSESSMENT/FINAL DIAGNOSIS:  Please see discharge diagnoses.   CONDITION ON DISCHARGE:  Improved.   ACTIVITY:  He will keep his splint clean, dry and intact.  Elevated and frequently move  fingers frequently.  He will keep his hip dresses clean,  dry and intact.  Wound care was discussed with him at length.   DISCHARGE MEDICATIONS:  Will include a resumption of his normal home  medication regimen, as well as:  1. Dilaudid 2 mg 1-2 p.o. q.4-6 hours p.r.n. pain.  2. Colace 100 mg 1 p.o. b.i.d.  3. Robaxin 500 mg 1 p.o. q.8 hours p.r.n. spasm.  It should be noted that his medication regimen did consist of  lisinopril, __________ and multivitamin.   We have also encouraged him to diligently use his home CPAP unit  given  his history of sleep apnea and have discouraged use of narcotics and  muscle relaxants in the evening hours.  He will follow up with in 8-10  days, call [phone] for an appointment.      Karie Chimera, P.A.-C.    ______________________________  Dionne Ano. Amanda Pea, M.D.    BB/MEDQ  D:  11/29/2006  T:  11/29/2006  Job:  [id number]

## 2010-12-23 NOTE — Op Note (Signed)
NAME:  Calvin Smith, Calvin Smith NO.:  [id number]   MEDICAL RECORD NO.:  [id number]          PATIENT TYPE:  INP   LOCATION:  5007                         FACILITY:  [facility]   PHYSICIAN:  Dionne Ano. Gramig III, M.D.DATE OF BIRTH:  Dec 07, 1960   DATE OF PROCEDURE:  10/17/2006  DATE OF DISCHARGE:                               OPERATIVE REPORT   PREOPERATIVE DIAGNOSIS:  Nascent malunion, left wrist, with hardware  failure collapse and progressive displacement of a comminuted complex  fracture pattern.  This patient has significant pain and ulnocarpal  abutment symptoms preoperatively.   POSTOPERATIVE DIAGNOSIS:  Nascent malunion, left wrist, with hardware  failure collapse and progressive displacement of a comminuted complex  fracture pattern.  This patient has significant pain and ulnocarpal  abutment symptoms preoperatively.   PROCEDURE:  1. Hardware, removal left wrist.  2. FCR tenolysis and tenosynovectomy, left wrist.  3. Flexor pollicis longus tenolysis and tenosynovectomy, left wrist.  4. Left wrist extensor pollicis longus decompression and tendon      transfer to the dorsal soft tissue.  5. Left brachioradialis tenotomy.  6. Open reduction internal fixation, malunion, left wrist, with      autologous bone graft and a large Synthes 2.4 spanning to serve as      internal fixation and bridge plating.  7. Left hip autologous bone graft used for a large defect about the      radius.  8. Allograft use of 5 mL utilizing Grafton bone graft.  9. Stress radiography.   SURGEONAmanda Pea, M.D.   ASSISTANT:  Karie Chimera, P.A.-C.   COMPLICATIONS:  None.   ANESTHESIA:  General tourniquet time 2 hours.   INDICATIONS FOR PROCEDURE:  This patient is a 50 year old male who  presents with the above-mentioned diagnosis.  I have counseled him in  regards to the risks and benefits of surgery including risk of  infection, bleeding, anesthesia, damage to normal structures and  failure  of surgery to accomplish its intended goals of relieving symptoms and  restoring function.  The patient unfortunately had a very comminuted  fracture with progressive collapse, and at present time, we have  discussed with him trying to restore his height and inclination to  reasonable expectations to give him pronation and supination abilities,  flexion and extension fingers and a more reasonable alignment.  Given  the comminuted nature, I have discussed with him the likely need for  wrist fusion secondary to degenerative/traumatic sequelae in the future.  He understands this.  I have discussed the risks and benefits of surgery  including bleeding, infection, anesthesia, damage to normal structures  and failure of surgery to accomplish its intended goals of relieving  symptoms and restoring function.  With this in mind, he desires to  proceed.   All questions have been encouraged answered preoperatively.  Unfortunately, the wrist is in quite a bit of disarray.  He understands  the progressive angulatory collapse issues and the proposed treatment  plans and meaningful expectations.   OPERATIVE PROCEDURE:  The patient was seen by myself and anesthesia.  Arm was marked in the holding  area.  He was counseled extensively, given  preoperative antibiotics and then taken to the operative suite where he  underwent a smooth induction of general anesthesia.  He was laid supine  and fully padded about the left upper extremity and left hip.  A bump  was placed under the hip.  Body parts were padded and checked, and  following securing a sterile field, the patient had the left arm  elevated, and tourniquet was inflated to 350 mmHg.  A volar radial  approach was made to the wrist.  The skin was incised.  He had dense  adhesions about the FCR and associated soft tissue investments.  I  performed a tenolysis and tenosynovectomy of the FCR tendon which was  encased in significant adhesive  tissue.  I then performed a fasciotomy  of the region.   Following this, the FPL tendon was identified and was densely adherent  to the volar forearm and also required a separate tenosynovectomy and  tenolysis.  Following FPL tenolysis and tenosynovectomy, I was able to  access the plate.   The plate had progressive loosening to some the distal screws, and the  proximal screws were intact.  The bone had fallen away from the screws.  The patient had the distal screws removed without difficulty in their  entirety, and these were removed from the operative field.  Following  this, I left the plate in place to serve as a volar buttress for the  malunion correction.   I turned attention at this point towards the dorsal aspect of the wrist  where a longus tendon incision and the mid third metacarpal to the  middle forearm was created.  Dissection was carried down.  The EPL  tendon was identified and transposed to the dorsal soft tissue.  I  released the brachioradialis tendon with tenotomy as this caused  significant loss of radial height.   The patient had brachioradialis tenotomy performed without difficulty  and EPL decompression and transposition performed without complicating  feature.   Following this, I very carefully took down the malunion, and with  osteotome and a combination of Freer elevator, I recreated the dorsal V  defect.  Once this was done, it was quite apparent the patient would  need a very large bone graft, and thus, attention was turned towards the  left hip.   The left wrist was treated with moistened laparotomy sponge and a large  iliac crest bone graft taken from the hip.  The patient had dissection  carried down about the skin with a knife blade.  Following this, sharp  and dull dissection with orthopedic  instrument was carried down to iliac crest where a large cortical cancellous bone graft was taken.  I  preserved the inner table and was very careful not to  puncture through  the inner wall.  The cortical cancellous bone graft was taken without  difficulty, and there no complicating features.  Following this, the  patient then underwent placement of Gelfoam with thrombin followed by  closure of the deep area with 0 Vicryl.  Eighth-inch Hemovac drain was  placed.  Two-0 Vicryl and staple clip was placed in the skin edge.   Following this, I then placed orthopedic instruments for sculpting  purposes about the bone graft region.  Copious amounts of cancellous  bone was placed in the area of void as well as a tricortical bone graft.  The tricortical graft was placed to my satisfaction without difficulty,  and there  were no complicating features with this placement.  This  preserved the height and recreated a semblance of the normal anatomy.   I should note that the patient did have very a comminuted area ulnarly  about the lunate facet.  The lunate and scaphoid facet were relatively  well in their regions, but certainly I noticed some depression of the  distal radioulnar joint.  I checked this multiple times, and actually  the distal radioulnar joint moved very nicely after reduction was  accomplished.  Thus, at this time, I placed all the bone graft to my  satisfaction to stabilize this provisionally and then performed  placement of a 2.4 Synthes spanning plate.  Screws were placed in the  radius and the third metacarpal, 5 proximally and 4 distally.  These  were locking screws in combination with one cortical screw.  This served  as an internal fixation device to span the radius and allow this to  heal.   I should note that this was placed out difficulty, and I took care to  make sure that this was not impinging against the pins or causing undue  problems.  With this performed, I then performed FiberWire suture of the  remaining fragments as necessary and packed additional bone graft in the  region.  There was still a small void, and I  packed a bit more with  allograft bone graft from Grafton.  This was a 5 mL mixture of  OrthoBlend.   Following this, I then left the EPL in a transposed state and deflated  the tourniquet and closed the dorsal wound with Vicryl followed by  Prolene.  A 1/8-inch Hemovac drain was placed dorsally.  The volar wound  was closed with Vicryl, a TLS drain and a Prolene skin closure.  The  patient had soft compartments.  No signs of infection, dystrophy or  neurovascular compromise at the end of the procedure.  Throughout the  course, I did identify the bony fragments, and I should note that there  was no evidence whatsoever of infection during the course of the  exploration and reconstruction.   I was very pleased overall with the height and inclination for the most  part.  The patient had excellent pronation and supination after the plate was placed.  The volar plate was left intact for a dorsal ledge.  I did loosen this up at one point in time during the case to insure that  it was providing adequate support and was necessary, and it was, in  fact, necessary.   The patient had Xeroform followed by gauze, sterile dressing and long-  arm plaster splint placed.  He tolerated this well.  There were no  complicating features.   Following placement of the splint, he was taken to recovery room where  he was noted to be in excellent condition.  He will be monitored for  pain management, be given IV antibiotics and will proceed according to  general postoperative protocol for this type of procedure.  I expect a 2-  to 3-day hospitalization.   We are going to watch his condition closely.  Certainly, I was happy  with the reconstruction but do feel this patient has a very high  propensity towards traumatic arthritis in the future.  I have discussed  with him and his wife at length.   It has been a pleasure to participate in the care of this very  interesting gentleman.  We will do our best to  return him to quiescence.           ______________________________  Dionne Ano. Everlene Other, M.D.     Nash Mantis  D:  10/17/2006  T:  10/19/2006  Job:  [id number]   cc:   Dionne Ano. Everlene Other, M.D.  M.D. [place]

## 2011-02-18 ENCOUNTER — Encounter: Payer: Self-pay | Admitting: Internal Medicine

## 2011-02-22 ENCOUNTER — Ambulatory Visit: Payer: Self-pay | Admitting: Internal Medicine

## 2011-02-22 DIAGNOSIS — Z0289 Encounter for other administrative examinations: Secondary | ICD-10-CM

## 2011-03-09 ENCOUNTER — Other Ambulatory Visit: Payer: Self-pay | Admitting: *Deleted

## 2011-03-10 MED ORDER — TESTOSTERONE 50 MG/5GM (1%) TD GEL: 5.0000 g | Freq: Every day | TRANSDERMAL | Status: DC

## 2011-03-10 NOTE — Telephone Encounter (Signed)
Rx called to pharmacy

## 2011-06-12 ENCOUNTER — Other Ambulatory Visit: Payer: Self-pay | Admitting: *Deleted

## 2011-06-12 MED ORDER — GLIPIZIDE ER 5 MG PO TB24: 5.0000 mg | ORAL_TABLET | Freq: Every day | ORAL | Status: DC

## 2011-06-12 NOTE — Telephone Encounter (Signed)
Pt is waiting for mail order shipment to arrive.

## 2011-06-14 ENCOUNTER — Telehealth: Payer: Self-pay | Admitting: *Deleted

## 2011-06-14 NOTE — Telephone Encounter (Signed)
Calling pt because we received refill request and pt hasn't been seen in over a year and needs an appt, .left message to have patient return my call.

## 2011-06-15 NOTE — Telephone Encounter (Signed)
.  left message to have patient return my call.  

## 2011-06-16 ENCOUNTER — Ambulatory Visit (INDEPENDENT_AMBULATORY_CARE_PROVIDER_SITE_OTHER): Payer: Self-pay | Admitting: Family Medicine

## 2011-06-16 ENCOUNTER — Encounter: Payer: Self-pay | Admitting: Family Medicine

## 2011-06-16 DIAGNOSIS — L989 Disorder of the skin and subcutaneous tissue, unspecified: Secondary | ICD-10-CM

## 2011-06-16 MED ORDER — CEPHALEXIN 500 MG PO CAPS: 500.0000 mg | ORAL_CAPSULE | Freq: Four times a day (QID) | ORAL | Status: AC

## 2011-06-16 NOTE — Patient Instructions (Signed)
Take the antibiotics 4 times a day, use warm compresses on the spot, and schedule a follow up with Dr. Alphonsus Sias about your sugar.  Take care.

## 2011-06-18 ENCOUNTER — Encounter: Payer: Self-pay | Admitting: Family Medicine

## 2011-06-18 DIAGNOSIS — L989 Disorder of the skin and subcutaneous tissue, unspecified: Secondary | ICD-10-CM | POA: Insufficient documentation

## 2011-06-18 NOTE — Progress Notes (Signed)
"  bump on back on neck"  Present for months, likely nicked at barbers and sore since then.  Had drained some.  No FCNAV.  Sugar was ~200 on prev checks, hasn't had f/u with PMD recently about DM2.  Feeling well o/w.   Meds, vitals, and allergies reviewed.   ROS: See HPI.  Otherwise, noncontributory.  nad ncat except for 2 lesions on back of neck.  1 is 0.5cm across and intact.  The other is 1.5cm across.  Both are fleshy papules w/o ulceration or fluctuance.  No rolled borders or telangectasia on either lesion.  The larger has 2 shallow defects, each defect is slightly irritated w/o pus draining.   No la Neck supple

## 2011-06-18 NOTE — Assessment & Plan Note (Signed)
Benign appearing skin lesion with irritation at the site of disruption.  I would start keflex given the irritation and h/o DM2.  Warm compress and f/u with PMD re: DM2.

## 2011-06-19 NOTE — Telephone Encounter (Signed)
Patient still not calling back, will leave message at the pharmacy

## 2011-06-28 ENCOUNTER — Other Ambulatory Visit: Payer: Self-pay | Admitting: *Deleted

## 2011-06-28 NOTE — Telephone Encounter (Signed)
Ok to refill? Last CPX 05/2010, pt canceled last 2 appts. Please advise

## 2011-06-29 NOTE — Telephone Encounter (Signed)
Okay to send 6 months of all and reschedule PE for soon

## 2011-06-30 MED ORDER — GLIPIZIDE ER 5 MG PO TB24: 5.0000 mg | ORAL_TABLET | Freq: Every day | ORAL | Status: DC

## 2011-06-30 MED ORDER — LISINOPRIL 20 MG PO TABS: 20.0000 mg | ORAL_TABLET | Freq: Every day | ORAL | Status: DC

## 2011-06-30 MED ORDER — METFORMIN HCL 1000 MG PO TABS: 1000.0000 mg | ORAL_TABLET | Freq: Two times a day (BID) | ORAL | Status: DC

## 2011-07-03 NOTE — Telephone Encounter (Signed)
See message below °

## 2011-08-14 ENCOUNTER — Other Ambulatory Visit: Payer: Self-pay | Admitting: *Deleted

## 2011-08-14 MED ORDER — GLIPIZIDE ER 5 MG PO TB24: 5.0000 mg | ORAL_TABLET | Freq: Every day | ORAL | Status: DC

## 2011-08-14 NOTE — Telephone Encounter (Signed)
rx sent to pharmacy by e-script Spoke with patient and advised that he needs to schedule an f/u appt and he states he's been seen in the last year, I advised from his appt schedule that he haven't been seen, per pt he will call back and sch appt

## 2011-08-14 NOTE — Telephone Encounter (Signed)
Patient called stating that all of his prescriptions should have been switched over to [facility]. Patient states that he has gotten some of his medications, but is some confusion with his Glipizide and he is trying to get this straightened out. Patient would like a 30 day supply sent to a local pharmacy until he can get all of this figured out. Pharmacy [facility].

## 2011-09-13 ENCOUNTER — Other Ambulatory Visit: Payer: Self-pay | Admitting: Internal Medicine

## 2011-09-22 ENCOUNTER — Other Ambulatory Visit: Payer: Self-pay | Admitting: Internal Medicine

## 2011-09-22 NOTE — Telephone Encounter (Signed)
Phone note 

## 2011-09-25 NOTE — Telephone Encounter (Signed)
Calling pt to see what refills he need. Marland Kitchenleft message to have patient return my call.

## 2011-09-28 NOTE — Telephone Encounter (Signed)
.  left message to have patient return my call.  

## 2011-09-29 ENCOUNTER — Telehealth: Payer: Self-pay | Admitting: Internal Medicine

## 2011-09-29 MED ORDER — SITAGLIPTIN PHOSPHATE 100 MG PO TABS: 100.0000 mg | ORAL_TABLET | Freq: Every day | ORAL | Status: DC

## 2011-09-29 MED ORDER — METFORMIN HCL 1000 MG PO TABS: 1000.0000 mg | ORAL_TABLET | Freq: Two times a day (BID) | ORAL | Status: DC

## 2011-09-29 MED ORDER — GLIPIZIDE ER 5 MG PO TB24: 5.0000 mg | ORAL_TABLET | Freq: Every day | ORAL | Status: DC

## 2011-09-29 MED ORDER — LISINOPRIL 20 MG PO TABS: 20.0000 mg | ORAL_TABLET | Freq: Every day | ORAL | Status: DC

## 2011-09-29 NOTE — Telephone Encounter (Signed)
Patient said he was returning Dee's call.

## 2011-09-29 NOTE — Telephone Encounter (Signed)
Spoke with patient and he needed all medications sent to [facility]. rx sent to pharmacy by e-script

## 2011-09-29 NOTE — Telephone Encounter (Signed)
.  left message to have patient return my call.  

## 2011-10-03 ENCOUNTER — Other Ambulatory Visit: Payer: Self-pay | Admitting: *Deleted

## 2011-10-03 MED ORDER — TESTOSTERONE 50 MG/5GM (1%) TD GEL: 5.0000 g | Freq: Every day | TRANSDERMAL | Status: DC

## 2011-10-03 NOTE — Telephone Encounter (Signed)
This is on his list but I don't remember prescribing this Did he get it from someone else first  Okay to fill only enough till his upcoming appt

## 2011-10-03 NOTE — Telephone Encounter (Signed)
rx called into pharmacy .left message to have patient return my call.  

## 2011-12-04 ENCOUNTER — Encounter: Payer: Self-pay | Admitting: Internal Medicine

## 2011-12-04 ENCOUNTER — Ambulatory Visit (INDEPENDENT_AMBULATORY_CARE_PROVIDER_SITE_OTHER): Payer: 59 | Admitting: Internal Medicine

## 2011-12-04 VITALS — BP 108/70 | HR 85 | Temp 98.2°F | Ht 67.0 in | Wt 197.0 lb

## 2011-12-04 DIAGNOSIS — E785 Hyperlipidemia, unspecified: Secondary | ICD-10-CM

## 2011-12-04 DIAGNOSIS — Z0000 Encounter for general adult medical examination without abnormal findings: Secondary | ICD-10-CM | POA: Insufficient documentation

## 2011-12-04 DIAGNOSIS — I10 Essential (primary) hypertension: Secondary | ICD-10-CM

## 2011-12-04 DIAGNOSIS — Z1211 Encounter for screening for malignant neoplasm of colon: Secondary | ICD-10-CM

## 2011-12-04 DIAGNOSIS — E291 Testicular hypofunction: Secondary | ICD-10-CM

## 2011-12-04 DIAGNOSIS — Z23 Encounter for immunization: Secondary | ICD-10-CM

## 2011-12-04 DIAGNOSIS — G43909 Migraine, unspecified, not intractable, without status migrainosus: Secondary | ICD-10-CM

## 2011-12-04 DIAGNOSIS — E119 Type 2 diabetes mellitus without complications: Secondary | ICD-10-CM

## 2011-12-04 MED ORDER — RIZATRIPTAN BENZOATE 10 MG PO TABS: 10.0000 mg | ORAL_TABLET | ORAL | Status: DC | PRN

## 2011-12-04 NOTE — Progress Notes (Signed)
Subjective:    Patient ID: Calvin Smith, male    DOB: 1960-10-12, 51 y.o.   MRN: [id number]  HPI Here for physical  Has worked on exercise and is in better shape Weight is down Diet better though not great---"too much bread". Has given up sugared drinks Checks sugars daily in AM----190-210 for several months. Does come down later  Still gets occ migraines maxalt helped but imitrex failed May get 4 bad headaches a year  Current Outpatient Prescriptions on File Prior to Visit  Medication Sig Dispense Refill  . Alcohol Swabs (ALCOHOL WIPES) 70 % PADS Use as instructed to test blood sugar two times a day dx 250.00       . CIALIS 20 MG tablet TAKE 1/2 TO 1 TABLET 1 HOUR BEFORE SEX  6 tablet  1  . glipiZIDE (GLUCOTROL XL) 5 MG 24 hr tablet Take 1 tablet (5 mg total) by mouth daily.  90 tablet  3  . glucose blood (ONE TOUCH TEST STRIPS) test strip Use as instructed to test blood sugar two times a day dx 250.00       . Lancets (ONETOUCH ULTRASOFT) lancets Use as instructed to test blood sugar two times a day dx 250.00       . lisinopril (PRINIVIL,ZESTRIL) 20 MG tablet Take 1 tablet (20 mg total) by mouth daily.  90 tablet  3  . metFORMIN (GLUCOPHAGE) 1000 MG tablet Take 1 tablet (1,000 mg total) by mouth 2 (two) times daily with a meal.  180 tablet  3  . sitaGLIPtin (JANUVIA) 100 MG tablet Take 1 tablet (100 mg total) by mouth daily.  90 tablet  3  . SUMAtriptan (IMITREX) 100 MG tablet 1/2 to one at onset of migraine       . testosterone (ANDROGEL) 50 MG/5GM GEL Place 5 g onto the skin daily. As directed  150 g  0  . rizatriptan (MAXALT) 10 MG tablet Take 1 tablet (10 mg total) by mouth as needed for migraine. May repeat in 2 hours if needed  6 tablet  2    Allergies  Allergen Reactions  . Bisoprolol-Hydrochlorothiazide     REACTION: E.D.  . Codeine     REACTION: itch    Past Medical History  Diagnosis Date  . Allergic rhinitis   . Hypertension   . Hyperlipidemia   . Migraines    . Sleep apnea   . Erectile dysfunction   . Diabetes mellitus     type 2  . Arm fracture, left as a child  . DeQuervain's disease (tenosynovitis)     right wrist    Past Surgical History  Procedure Date  . Wrist surgery 1/04    DeQuervain release  . Carpal tunnel release 9/03    left   . Cardiolite 5/06    Negative EF 66%  . Wrist surgery 1/08    ORIF l wrist DVR plate screwhead autologous    Family History  Problem Relation Age of Onset  . Hypertension Father   . Hypertension Paternal Grandfather   . Lymphoma Father     non-hodgkins  . Lung cancer Paternal Aunt   . Coronary artery disease Paternal Uncle   . Colon cancer      in 1 relative (?aunt)  . Uterine cancer Maternal Grandmother     History   Social History  . Marital Status: Married    Spouse Name: N/A    Number of Children: N/A  . Years of Education:  N/A   Occupational History  . 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 Smoking status: Never Smoker   . Smokeless tobacco: Never Used  . Alcohol Use: Yes     rare  . Drug Use: Not on file  . Sexually Active: Not on file   Other Topics Concern  . Not on file   Social History Narrative  . No narrative on file   Review of Systems  Constitutional: Positive for activity change. Negative for fatigue.       Weight is down Wears seat belt  HENT: Negative for hearing loss, congestion, rhinorrhea, dental problem and tinnitus.        Regular with dentist  Eyes: Negative for visual disturbance.       No diplopia or unilateral vision loss  Respiratory: Negative for cough, chest tightness and shortness of breath.   Cardiovascular: Negative for chest pain, palpitations and leg swelling.  Gastrointestinal: Negative for nausea, vomiting, abdominal pain, constipation and blood in stool.       No heartburn  Genitourinary: Negative for frequency and difficulty urinating.       Sexual function okay with testosterone and cialis    Musculoskeletal: Positive for arthralgias. Negative for back pain and joint swelling.       Some hip pain on rainy days  Skin: Negative for rash.       No suspicious areas  Neurological: Positive for headaches. Negative for dizziness, syncope, weakness, light-headedness and numbness.  Hematological: Negative for adenopathy. Does not bruise/bleed easily.  Psychiatric/Behavioral: Negative for sleep disturbance and dysphoric mood. The patient is not nervous/anxious.        Sleeping better Mask is broken but no AM grogginess and wife doesn't note apnea or snoring        Objective:   Physical Exam  Constitutional: He is oriented to person, place, and time. He appears well-developed and well-nourished. No distress.  HENT:  Head: Normocephalic and atraumatic.  Right Ear: External ear normal.  Left Ear: External ear normal.  Mouth/Throat: Oropharynx is clear and moist. No oropharyngeal exudate.  Eyes: Conjunctivae and EOM are normal. Pupils are equal, round, and reactive to light.  Neck: Normal range of motion. Neck supple. No thyromegaly present.  Cardiovascular: Normal rate, regular rhythm, normal heart sounds and intact distal pulses.  Exam reveals no gallop.   No murmur heard. Pulmonary/Chest: Effort normal and breath sounds normal. No respiratory distress. He has no wheezes. He has no rales.  Abdominal: Soft. There is no tenderness.  Musculoskeletal: Normal range of motion. He exhibits no edema and no tenderness.  Lymphadenopathy:    He has no cervical adenopathy.  Neurological: He is alert and oriented to person, place, and time. He exhibits normal muscle tone.  Skin: No rash noted. No erythema.       No foot lesions  Psychiatric: He has a normal mood and affect. His behavior is normal. Thought content normal.          Assessment & Plan:

## 2011-12-04 NOTE — Assessment & Plan Note (Signed)
Failed imitrex Rx sendt for maxalt again

## 2011-12-04 NOTE — Progress Notes (Signed)
Addended by: Sueanne Margarita on: 12/04/2011 04:11 PM   Modules accepted: Orders

## 2011-12-04 NOTE — Assessment & Plan Note (Signed)
BP Readings from Last 3 Encounters:  12/04/11 108/70  06/16/11 120/68  09/05/10 110/70   Will continue the med for now Could consider weaning it down but no side effects

## 2011-12-04 NOTE — Assessment & Plan Note (Signed)
Sugar high fasting Almost certainly will need lantus Will teach shots here if needed

## 2011-12-04 NOTE — Assessment & Plan Note (Signed)
Has worked on fitness and lost weight Will do stool immunoassay---prefers not to do colonoscopy if possible PSA ---since on testosterone

## 2011-12-04 NOTE — Assessment & Plan Note (Signed)
Will need to check levels Not excited about meds Will start statin if LDL >130

## 2011-12-04 NOTE — Assessment & Plan Note (Signed)
Satisfied with the testosterone Rx

## 2011-12-05 LAB — CBC WITH DIFFERENTIAL/PLATELET
Basophils Absolute: 0 10*3/uL (ref 0.0–0.2)
Immature Granulocytes: 0 % (ref 0–2)
Lymphocytes Absolute: 3.4 10*3/uL (ref 0.7–4.5)
Lymphs: 39 % (ref 14–46)
MCHC: 34.9 g/dL (ref 31.5–35.7)
Monocytes: 8 % (ref 4–13)
RDW: 13.1 % (ref 12.3–15.4)

## 2011-12-05 LAB — BASIC METABOLIC PANEL
CO2: 19 mmol/L — ABNORMAL LOW (ref 20–32)
Calcium: 9.5 mg/dL (ref 8.7–10.2)
Chloride: 95 mmol/L — ABNORMAL LOW (ref 97–108)
Potassium: 4.2 mmol/L (ref 3.5–5.2)
Sodium: 133 mmol/L — ABNORMAL LOW (ref 134–144)

## 2011-12-05 LAB — HEMOGLOBIN A1C: Hgb A1c MFr Bld: 10.5 % — ABNORMAL HIGH (ref 4.8–5.6)

## 2011-12-05 LAB — HEPATIC FUNCTION PANEL
Alkaline Phosphatase: 83 IU/L (ref 25–150)
Total Protein: 7.3 g/dL (ref 6.0–8.5)

## 2011-12-05 LAB — LIPID PANEL: Chol/HDL Ratio: 4.3 ratio units (ref 0.0–5.0)

## 2011-12-05 LAB — PSA: PSA: 0.5 ng/mL (ref 0.0–4.0)

## 2011-12-05 LAB — MICROALBUMIN / CREATININE URINE RATIO
Microalb Creat Ratio: 9.2 mg/g creat (ref 0.0–30.0)
Microalbumin, Urine: 6.5 ug/mL (ref 0.0–17.0)

## 2011-12-05 LAB — TSH: TSH: 4.85 u[IU]/mL — ABNORMAL HIGH (ref 0.450–4.500)

## 2011-12-19 ENCOUNTER — Other Ambulatory Visit: Payer: 59

## 2011-12-19 DIAGNOSIS — Z1211 Encounter for screening for malignant neoplasm of colon: Secondary | ICD-10-CM

## 2011-12-19 LAB — FECAL OCCULT BLOOD, IMMUNOCHEMICAL: Fecal Occult Bld: NEGATIVE

## 2011-12-20 ENCOUNTER — Encounter: Payer: Self-pay | Admitting: *Deleted

## 2011-12-25 ENCOUNTER — Telehealth: Payer: Self-pay | Admitting: Internal Medicine

## 2011-12-25 NOTE — Telephone Encounter (Signed)
Patient said he and Geraldine Contras have been trying to get in touch with each other and keep missing each other.  He asks for a return call and left his number and his wife's contact number.  His wife can be called at [phone] if you are unable to reach him on his phone number.

## 2011-12-26 ENCOUNTER — Encounter: Payer: Self-pay | Admitting: *Deleted

## 2011-12-26 MED ORDER — TADALAFIL 20 MG PO TABS: 20.0000 mg | ORAL_TABLET | Freq: Every day | ORAL | Status: DC | PRN

## 2011-12-26 MED ORDER — INSULIN GLARGINE 100 UNIT/ML ~~LOC~~ SOLN: 10.0000 [IU] | Freq: Every day | SUBCUTANEOUS | Status: DC

## 2011-12-26 MED ORDER — TESTOSTERONE 50 MG/5GM (1%) TD GEL: 5.0000 g | Freq: Every day | TRANSDERMAL | Status: DC

## 2011-12-26 NOTE — Telephone Encounter (Signed)
Spoke with patient and advised results   

## 2011-12-27 ENCOUNTER — Ambulatory Visit (INDEPENDENT_AMBULATORY_CARE_PROVIDER_SITE_OTHER): Payer: 59 | Admitting: *Deleted

## 2011-12-27 DIAGNOSIS — E119 Type 2 diabetes mellitus without complications: Secondary | ICD-10-CM

## 2011-12-27 MED ORDER — INSULIN GLARGINE 100 UNIT/ML ~~LOC~~ SOLN: 10.0000 [IU] | Freq: Every day | SUBCUTANEOUS | Status: DC

## 2011-12-27 NOTE — Progress Notes (Signed)
  Subjective:    Patient ID: Calvin Smith, male    DOB: [birth year], 51 y.o.   MRN: [id number]  HPI  Patient came in today for instructions on how to give insulin, pt also wanted rx for lantus [facility], which was done.  Review of Systems     Objective:   Physical Exam        Assessment & Plan:

## 2012-03-28 ENCOUNTER — Ambulatory Visit: Payer: 59 | Admitting: Internal Medicine

## 2012-03-29 ENCOUNTER — Encounter: Payer: Self-pay | Admitting: Internal Medicine

## 2012-03-29 ENCOUNTER — Ambulatory Visit (INDEPENDENT_AMBULATORY_CARE_PROVIDER_SITE_OTHER): Payer: 59 | Admitting: Internal Medicine

## 2012-03-29 VITALS — BP 110/68 | HR 82 | Temp 97.5°F | Ht 67.0 in | Wt 200.0 lb

## 2012-03-29 DIAGNOSIS — E785 Hyperlipidemia, unspecified: Secondary | ICD-10-CM

## 2012-03-29 DIAGNOSIS — E1140 Type 2 diabetes mellitus with diabetic neuropathy, unspecified: Secondary | ICD-10-CM | POA: Insufficient documentation

## 2012-03-29 MED ORDER — RIZATRIPTAN BENZOATE 10 MG PO TABS: 10.0000 mg | ORAL_TABLET | ORAL | Status: DC | PRN

## 2012-03-29 MED ORDER — ATORVASTATIN CALCIUM 10 MG PO TABS: 10.0000 mg | ORAL_TABLET | Freq: Every day | ORAL | Status: DC

## 2012-03-29 NOTE — Assessment & Plan Note (Signed)
Definitely better but not clearly at goal May need to titrate lantus still Will need new rx Counseled all of 15 minute visit

## 2012-03-29 NOTE — Progress Notes (Signed)
Subjective:    Patient ID: Calvin Smith, male    DOB: 28-Mar-1961, 51 y.o.   MRN: [id number]  HPI Did start the lantus Has titrated all the way up to 50 units daily  Checking sugars every morning Still hovering around 150 No low sugar reactions Still not entirely compliant with watching carbs Does some exercise---but inconsistent  Discussed cholesterol as well Current Outpatient Prescriptions on File Prior to Visit  Medication Sig Dispense Refill  . Alcohol Swabs (ALCOHOL WIPES) 70 % PADS Use as instructed to test blood sugar two times a day dx 250.00       . aspirin EC 81 MG tablet Take 81 mg by mouth daily.      Marland Kitchen glipiZIDE (GLUCOTROL XL) 5 MG 24 hr tablet Take 1 tablet (5 mg total) by mouth daily.  90 tablet  3  . glucose blood (ONE TOUCH TEST STRIPS) test strip Use as instructed to test blood sugar two times a day dx 250.00       . insulin glargine (LANTUS SOLOSTAR) 100 UNIT/ML injection Inject 10 Units into the skin at bedtime.  15 pen  3  . Lancets (ONETOUCH ULTRASOFT) lancets Use as instructed to test blood sugar two times a day dx 250.00       . lisinopril (PRINIVIL,ZESTRIL) 20 MG tablet Take 1 tablet (20 mg total) by mouth daily.  90 tablet  3  . metFORMIN (GLUCOPHAGE) 1000 MG tablet Take 1 tablet (1,000 mg total) by mouth 2 (two) times daily with a meal.  180 tablet  3  . sitaGLIPtin (JANUVIA) 100 MG tablet Take 1 tablet (100 mg total) by mouth daily.  90 tablet  3  . tadalafil (CIALIS) 20 MG tablet Take 1 tablet (20 mg total) by mouth daily as needed for erectile dysfunction.  6 tablet  6  . testosterone (ANDROGEL) 50 MG/5GM GEL Place 5 g onto the skin daily. As directed  150 g  0  . DISCONTD: rizatriptan (MAXALT) 10 MG tablet Take 1 tablet (10 mg total) by mouth as needed for migraine. May repeat in 2 hours if needed  6 tablet  2    Allergies  Allergen Reactions  . Bisoprolol-Hydrochlorothiazide     REACTION: E.D.  . Codeine     REACTION: itch    Past Medical  History  Diagnosis Date  . Allergic rhinitis   . Hypertension   . Hyperlipidemia   . Migraines   . Sleep apnea   . Erectile dysfunction   . Diabetes mellitus     type 2  . Arm fracture, left as a child  . DeQuervain's disease (tenosynovitis)     right wrist    Past Surgical History  Procedure Date  . Wrist surgery 1/04    DeQuervain release  . Carpal tunnel release 9/03    left   . Cardiolite 5/06    Negative EF 66%  . Wrist surgery 1/08    ORIF l wrist DVR plate screwhead autologous    Family History  Problem Relation Age of Onset  . Hypertension Father   . Hypertension Paternal Grandfather   . Lymphoma Father     non-hodgkins  . Lung cancer Paternal Aunt   . Coronary artery disease Paternal Uncle   . Colon cancer      in 1 relative (?aunt)  . Uterine cancer Maternal Grandmother     History   Social History  . Marital Status: Married    Spouse Name: N/A  Number of Children: N/A  . 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 Smoking status: Never Smoker   . Smokeless tobacco: Never Used  . Alcohol Use: Yes     rare  . Drug Use: Not on file  . Sexually Active: Not on file   Other Topics Concern  . Not on file   Social History Narrative  . No narrative on file   Review of Systems     Objective:   Physical Exam        Assessment & Plan:

## 2012-03-29 NOTE — Assessment & Plan Note (Signed)
Discussed treatment  He will start low dose atorvastatin [facility] at next appt  Lab Results  Component Value Date   LDLCALC 123* 12/04/2011

## 2012-03-30 LAB — HEMOGLOBIN A1C: Hgb A1c MFr Bld: 9.2 % — ABNORMAL HIGH (ref 4.8–5.6)

## 2012-04-01 ENCOUNTER — Telehealth: Payer: Self-pay | Admitting: *Deleted

## 2012-04-01 MED ORDER — "INSULIN SYRINGE-NEEDLE U-100 31G X 5/16"" 0.5 ML MISC": Status: DC

## 2012-04-01 NOTE — Telephone Encounter (Signed)
rx sent to pharmacy by e-script  

## 2012-04-01 NOTE — Telephone Encounter (Signed)
Patient stated that he was to call [facility] back with the information on his needles;; BD 0.25 X 8mm, 31G X 5/16. Patient states that he uses them twice a day and a script needs to be sent to [facility].

## 2012-04-02 ENCOUNTER — Encounter: Payer: Self-pay | Admitting: *Deleted

## 2012-04-10 ENCOUNTER — Telehealth: Payer: Self-pay

## 2012-04-10 NOTE — Telephone Encounter (Signed)
Jeanine with [facility] request call back to confirm if pt needs syringes or pen needles; Jeanine cannot reach pt for verification. Call [phone] with ref # [id number].

## 2012-04-11 ENCOUNTER — Other Ambulatory Visit: Payer: Self-pay | Admitting: *Deleted

## 2012-04-11 MED ORDER — TESTOSTERONE 50 MG/5GM (1%) TD GEL: 5.0000 g | Freq: Every day | TRANSDERMAL | Status: DC

## 2012-04-11 NOTE — Telephone Encounter (Signed)
.  rx faxed to pharmacy, manually.  

## 2012-04-12 NOTE — Telephone Encounter (Signed)
Those was just ordered

## 2012-04-26 ENCOUNTER — Other Ambulatory Visit: Payer: Self-pay

## 2012-04-26 MED ORDER — INSULIN GLARGINE 100 UNIT/ML ~~LOC~~ SOLN: 58.0000 [IU] | Freq: Every day | SUBCUTANEOUS | Status: DC

## 2012-04-26 MED ORDER — INSULIN PEN NEEDLE 31G X 8 MM MISC: Status: DC

## 2012-04-26 NOTE — Telephone Encounter (Signed)
Patient walked in to discuss refills with [facility] and to correct his pen needles, discussed with patient and corrected rx, [facility]

## 2012-05-15 ENCOUNTER — Other Ambulatory Visit: Payer: Self-pay

## 2012-05-15 MED ORDER — LISINOPRIL 20 MG PO TABS: 20.0000 mg | ORAL_TABLET | Freq: Every day | ORAL | Status: DC

## 2012-05-15 NOTE — Telephone Encounter (Signed)
Pt request refill for # [facility] x 2 to [facility] for [facility]; also # [facility] to [facility] [address] until [facility] arrives.pt notified done.

## 2012-06-04 ENCOUNTER — Ambulatory Visit: Payer: 59 | Admitting: Internal Medicine

## 2012-06-26 ENCOUNTER — Ambulatory Visit (INDEPENDENT_AMBULATORY_CARE_PROVIDER_SITE_OTHER): Payer: 59 | Admitting: Internal Medicine

## 2012-06-26 ENCOUNTER — Encounter: Payer: Self-pay | Admitting: Internal Medicine

## 2012-06-26 VITALS — BP 120/80 | HR 89 | Temp 98.5°F | Wt 203.0 lb

## 2012-06-26 DIAGNOSIS — I10 Essential (primary) hypertension: Secondary | ICD-10-CM

## 2012-06-26 DIAGNOSIS — E785 Hyperlipidemia, unspecified: Secondary | ICD-10-CM

## 2012-06-26 NOTE — Assessment & Plan Note (Signed)
No problems on the med Will check labs

## 2012-06-26 NOTE — Progress Notes (Signed)
Subjective:    Patient ID: Calvin Smith, male    DOB: 1961/06/04, 51 y.o.   MRN: [id number]  HPI Doing about the same Fasting sugars 125-175--average probably ~150 Tests most days  lantus up to 58 No hypoglycemic reactions  Has been walking a lot Spends time walking at work, then 1.5-2 miles when he gets home  Eating has been the same Weight is up slightly  Has been on the atorvastatin Did notice some "sick" feeling after eating fried food No myalgias  No chest pain  No SOB No edema No dizziness or syncope  Current Outpatient Prescriptions on File Prior to Visit  Medication Sig Dispense Refill  . Alcohol Swabs (ALCOHOL WIPES) 70 % PADS Use as instructed to test blood sugar two times a day dx 250.00       . aspirin EC 81 MG tablet Take 81 mg by mouth daily.      Marland Kitchen atorvastatin (LIPITOR) 10 MG tablet Take 1 tablet (10 mg total) by mouth daily.  90 tablet  3  . GLIPIZIDE XL 5 MG 24 hr tablet Take 5 mg by mouth daily.       Marland Kitchen glucose blood (ONE TOUCH TEST STRIPS) test strip Use as instructed to test blood sugar two times a day dx 250.00       . insulin glargine (LANTUS SOLOSTAR) 100 UNIT/ML injection Inject 58 Units into the skin at bedtime. Dx: 250.00  10 pen  0  . Insulin Pen Needle (B-D ULTRAFINE III SHORT PEN) 31G X 8 MM MISC Use to inject insulin once daily dx:250.00  50 each  0  . JANUVIA 100 MG tablet Take 100 mg by mouth daily.       . Lancets (ONETOUCH ULTRASOFT) lancets Use as instructed to test blood sugar two times a day dx 250.00       . lisinopril (PRINIVIL,ZESTRIL) 20 MG tablet Take 1 tablet (20 mg total) by mouth daily.  14 tablet  0  . metFORMIN (GLUCOPHAGE) 1000 MG tablet Take 1,000 mg by mouth 2 (two) times daily with a meal.       . rizatriptan (MAXALT) 10 MG tablet Take 1 tablet (10 mg total) by mouth as needed for migraine. May repeat in 2 hours if needed  6 tablet  2  . tadalafil (CIALIS) 20 MG tablet Take 1 tablet (20 mg total) by mouth daily as needed  for erectile dysfunction.  6 tablet  6  . testosterone (ANDROGEL) 50 MG/5GM GEL Place 5 g onto the skin daily.  30 Tube  5    Allergies  Allergen Reactions  . Bisoprolol-Hydrochlorothiazide     REACTION: E.D.  . Codeine     REACTION: itch    Past Medical History  Diagnosis Date  . Allergic rhinitis   . Hypertension   . Hyperlipidemia   . Migraines   . Sleep apnea   . Erectile dysfunction   . Diabetes mellitus     type 2  . Arm fracture, left as a child  . DeQuervain's disease (tenosynovitis)     right wrist    Past Surgical History  Procedure Date  . Wrist surgery 1/04    DeQuervain release  . Carpal tunnel release 9/03    left   . Cardiolite 5/06    Negative EF 66%  . Wrist surgery 1/08    ORIF l wrist DVR plate screwhead autologous    Family History  Problem Relation Age of Onset  .  Hypertension Father   . Hypertension Paternal Grandfather   . Lymphoma Father     non-hodgkins  . Lung cancer Paternal Aunt   . Coronary artery disease Paternal Uncle   . Colon cancer      in 1 relative (?aunt)  . Uterine cancer Maternal Grandmother     History   Social History  . Marital Status: Married    Spouse Name: N/A    Number of Children: N/A  . 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 Smoking status: Never Smoker   . Smokeless tobacco: Never Used  . Alcohol Use: Yes     Comment: rare  . Drug Use: Not on file  . Sexually Active: Not on file   Other Topics Concern  . Not on file   Social History Narrative  . No narrative on file   Review of Systems Sleeping well Mood is good     Objective:   Physical Exam  Constitutional: He appears well-developed and well-nourished. No distress.  Neck: Normal range of motion. Neck supple. No thyromegaly present.  Cardiovascular: Normal rate, regular rhythm, normal heart sounds and intact distal pulses.  Exam reveals no gallop.     No murmur heard. Pulmonary/Chest: Effort normal and breath sounds normal. No respiratory distress. He has no wheezes. He has no rales.  Musculoskeletal: He exhibits no edema and no tenderness.  Lymphadenopathy:    He has no cervical adenopathy.  Psychiatric: He has a normal mood and affect. His behavior is normal.          Assessment & Plan:

## 2012-06-26 NOTE — Assessment & Plan Note (Signed)
May be some better Discussed lifestyle and weight loss If under 9%, will at least wait till next visit before adding meds

## 2012-06-26 NOTE — Assessment & Plan Note (Signed)
BP Readings from Last 3 Encounters:  06/26/12 120/80  03/29/12 110/68  12/04/11 108/70   Good control No changes needed

## 2012-06-27 LAB — LIPID PANEL
Chol/HDL Ratio: 3.2 ratio units (ref 0.0–5.0)
HDL: 49 mg/dL (ref >39)
LDL Calculated: 88 mg/dL (ref 0–99)
Triglycerides: 92 mg/dL (ref 0–149)

## 2012-06-27 LAB — HEPATIC FUNCTION PANEL: ALT: 59 IU/L — ABNORMAL HIGH (ref 0–44)

## 2012-09-21 ENCOUNTER — Other Ambulatory Visit: Payer: Self-pay

## 2012-10-05 ENCOUNTER — Other Ambulatory Visit: Payer: Self-pay | Admitting: Internal Medicine

## 2012-10-07 MED ORDER — INSULIN PEN NEEDLE 31G X 8 MM MISC: Status: DC

## 2012-10-07 MED ORDER — INSULIN GLARGINE 100 UNIT/ML ~~LOC~~ SOLN: 58.0000 [IU] | Freq: Every day | SUBCUTANEOUS | Status: DC

## 2012-10-07 MED ORDER — LISINOPRIL 20 MG PO TABS: 20.0000 mg | ORAL_TABLET | Freq: Every day | ORAL | Status: DC

## 2012-10-09 ENCOUNTER — Encounter: Payer: Self-pay | Admitting: Internal Medicine

## 2012-10-14 ENCOUNTER — Other Ambulatory Visit: Payer: Self-pay | Admitting: *Deleted

## 2012-10-14 MED ORDER — METFORMIN HCL 1000 MG PO TABS: 1000.0000 mg | ORAL_TABLET | Freq: Two times a day (BID) | ORAL | Status: DC

## 2012-10-14 MED ORDER — GLIPIZIDE ER 5 MG PO TB24: 5.0000 mg | ORAL_TABLET | Freq: Every day | ORAL | Status: DC

## 2012-10-14 MED ORDER — SITAGLIPTIN PHOSPHATE 100 MG PO TABS: 100.0000 mg | ORAL_TABLET | Freq: Every day | ORAL | Status: DC

## 2012-10-15 ENCOUNTER — Other Ambulatory Visit: Payer: Self-pay

## 2012-10-15 MED ORDER — GLIPIZIDE ER 5 MG PO TB24: 5.0000 mg | ORAL_TABLET | Freq: Every day | ORAL | Status: DC

## 2012-10-15 MED ORDER — ALCOHOL WIPES 70 % PADS: MEDICATED_PAD | Status: DC

## 2012-10-15 MED ORDER — TESTOSTERONE 50 MG/5GM (1%) TD GEL: 5.0000 g | Freq: Every day | TRANSDERMAL | Status: DC

## 2012-10-15 MED ORDER — METFORMIN HCL 1000 MG PO TABS: 1000.0000 mg | ORAL_TABLET | Freq: Two times a day (BID) | ORAL | Status: DC

## 2012-10-15 NOTE — Telephone Encounter (Signed)
[facility] not [facility] refill of glip[facility]ide on 10/14/12. Pt request resend [facility] for [facility]ide and pt is out of metformin and glipizide and request 30 day rx to [facility] [address]. Advised pt done.

## 2012-10-25 ENCOUNTER — Telehealth: Payer: Self-pay | Admitting: *Deleted

## 2012-10-25 NOTE — Telephone Encounter (Signed)
Form on your desk to be filled out for prior auth for Androgel

## 2012-10-25 NOTE — Telephone Encounter (Signed)
I need more information because it asks me why he can't use a formulary drug Please find out what is on their formulary and if he has used other meds  Also ask him who originally prescribed this---because I don't think it was me

## 2012-10-25 NOTE — Telephone Encounter (Signed)
.  left message to have patient return my call.  

## 2012-10-28 NOTE — Telephone Encounter (Signed)
Per fax I received today, prior authorization is not required at this time for ANDROGEL, this has been cancelled.  Form scanned in chart.

## 2012-10-29 NOTE — Telephone Encounter (Signed)
Pt left v/m returning call; request call back at [phone].

## 2012-10-29 NOTE — Telephone Encounter (Signed)
Pt left v/m Androgel has to be sent for 3 month supply thru [facility] (only available thru mail order pharmacy)the patient request Androgel sent to [facility]. Pt request call back.

## 2012-10-29 NOTE — Telephone Encounter (Signed)
Spoke with patient and he will check with his insurance to see what's on his formulary and give [place] a call back.

## 2012-10-30 MED ORDER — TESTOSTERONE 50 MG/5GM (1%) TD GEL: 5.0000 g | Freq: Every day | TRANSDERMAL | Status: DC

## 2012-10-30 NOTE — Addendum Note (Signed)
Addended by: Sueanne Margarita on: 10/30/2012 04:35 PM   Modules accepted: Orders

## 2012-10-30 NOTE — Telephone Encounter (Signed)
rx sent to [facility] by manual fax

## 2012-10-30 NOTE — Telephone Encounter (Signed)
Okay to send 1 year supply if we can Not sure it will go electronically though, but try

## 2012-12-24 ENCOUNTER — Encounter: Payer: Self-pay | Admitting: Internal Medicine

## 2012-12-24 ENCOUNTER — Ambulatory Visit (INDEPENDENT_AMBULATORY_CARE_PROVIDER_SITE_OTHER): Payer: 59 | Admitting: Internal Medicine

## 2012-12-24 VITALS — BP 110/70 | HR 82 | Temp 98.6°F | Ht 66.5 in | Wt 200.8 lb

## 2012-12-24 DIAGNOSIS — I10 Essential (primary) hypertension: Secondary | ICD-10-CM

## 2012-12-24 DIAGNOSIS — E785 Hyperlipidemia, unspecified: Secondary | ICD-10-CM

## 2012-12-24 DIAGNOSIS — E291 Testicular hypofunction: Secondary | ICD-10-CM

## 2012-12-24 DIAGNOSIS — Z1211 Encounter for screening for malignant neoplasm of colon: Secondary | ICD-10-CM

## 2012-12-24 DIAGNOSIS — Z0000 Encounter for general adult medical examination without abnormal findings: Secondary | ICD-10-CM

## 2012-12-24 MED ORDER — GLIPIZIDE ER 5 MG PO TB24: 5.0000 mg | ORAL_TABLET | Freq: Every day | ORAL | Status: DC

## 2012-12-24 MED ORDER — ATORVASTATIN CALCIUM 10 MG PO TABS: 10.0000 mg | ORAL_TABLET | Freq: Every day | ORAL | Status: DC

## 2012-12-24 MED ORDER — METFORMIN HCL 1000 MG PO TABS: 1000.0000 mg | ORAL_TABLET | Freq: Two times a day (BID) | ORAL | Status: DC

## 2012-12-24 MED ORDER — SITAGLIPTIN PHOSPHATE 100 MG PO TABS: 100.0000 mg | ORAL_TABLET | Freq: Every day | ORAL | Status: DC

## 2012-12-24 NOTE — Assessment & Plan Note (Signed)
If still over 8%---will refer to Dr Elvera Lennox

## 2012-12-24 NOTE — Assessment & Plan Note (Signed)
Discussed fitness Will check PSA Fecal immunoassay

## 2012-12-24 NOTE — Assessment & Plan Note (Signed)
Happy with the androgel

## 2012-12-24 NOTE — Assessment & Plan Note (Signed)
BP Readings from Last 3 Encounters:  12/24/12 110/70  06/26/12 120/80  03/29/12 110/68   Excellent control

## 2012-12-24 NOTE — Progress Notes (Signed)
Subjective:    Patient ID: Calvin Smith, male    DOB: 06/26/1961, 52 y.o.   MRN: [id number]  HPI Here for physical  Had a rough stretch with weight gain and dietary noncompliance Weight is back down again Hadn't been checking sugars but is again Fasting 140-160 No hypoglycemic reactions Last eye exam last year  Walks 2 miles 3-4 times per week (18 minute mile)--at work Faster when at home  Was out of androgel but now back on Helps general feelings as well as sexual function More "energized"   Current Outpatient Prescriptions on File Prior to Visit  Medication Sig Dispense Refill  . Alcohol Swabs (ALCOHOL WIPES) 70 % PADS Use as instructed to test blood sugar two times a day dx 250.00  200 each  3  . aspirin EC 81 MG tablet Take 81 mg by mouth daily.      Marland Kitchen atorvastatin (LIPITOR) 10 MG tablet Take 1 tablet (10 mg total) by mouth daily.  90 tablet  3  . glipiZIDE (GLIPIZIDE XL) 5 MG 24 hr tablet Take 1 tablet (5 mg total) by mouth daily.  90 tablet  0  . glucose blood (ONE TOUCH TEST STRIPS) test strip Use as instructed to test blood sugar two times a day dx 250.00       . insulin glargine (LANTUS SOLOSTAR) 100 UNIT/ML injection Inject 58 Units into the skin at bedtime. Dx: 250.00  10 pen  3  . Insulin Pen Needle (B-D ULTRAFINE III SHORT PEN) 31G X 8 MM MISC Use to inject insulin once daily dx:250.00  100 each  3  . Lancets (ONETOUCH ULTRASOFT) lancets Use as instructed to test blood sugar two times a day dx 250.00       . lisinopril (PRINIVIL,ZESTRIL) 20 MG tablet Take 1 tablet (20 mg total) by mouth daily.  90 tablet  3  . metFORMIN (GLUCOPHAGE) 1000 MG tablet Take 1 tablet (1,000 mg total) by mouth 2 (two) times daily with a meal.  60 tablet  0  . sitaGLIPtin (JANUVIA) 100 MG tablet Take 1 tablet (100 mg total) by mouth daily.  90 tablet  0  . testosterone (ANDROGEL) 50 MG/5GM GEL Place 5 g onto the skin daily.  3 Tube  3  . rizatriptan (MAXALT) 10 MG tablet Take 1 tablet (10  mg total) by mouth as needed for migraine. May repeat in 2 hours if needed  6 tablet  2  . tadalafil (CIALIS) 20 MG tablet Take 1 tablet (20 mg total) by mouth daily as needed for erectile dysfunction.  6 tablet  6   No current facility-administered medications on file prior to visit.    Allergies  Allergen Reactions  . Bisoprolol-Hydrochlorothiazide     REACTION: E.D.  . Codeine     REACTION: itch    Past Medical History  Diagnosis Date  . Allergic rhinitis   . Hypertension   . Hyperlipidemia   . Migraines   . Sleep apnea   . Erectile dysfunction   . Diabetes mellitus     type 2  . Arm fracture, left as a child  . DeQuervain's disease (tenosynovitis)     right wrist    Past Surgical History  Procedure Laterality Date  . Wrist surgery  1/04    DeQuervain release  . Carpal tunnel release  9/03    left   . Cardiolite  5/06    Negative EF 66%  . Wrist surgery  1/08  ORIF l wrist DVR plate screwhead autologous    Family History  Problem Relation Age of Onset  . Hypertension Father   . Hypertension Paternal Grandfather   . Lymphoma Father     non-hodgkins  . Lung cancer Paternal Aunt   . Coronary artery disease Paternal Uncle   . Colon cancer      in 1 relative (?aunt)  . Uterine cancer Maternal Grandmother     History   Social History  . Marital Status: Married    Spouse Name: N/A    Number of Children: N/A  . 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 Smoking status: Never Smoker   . Smokeless tobacco: Never Used  . Alcohol Use: Yes     Comment: rare  . Drug Use: Not on file  . Sexually Active: Not on file   Other Topics Concern  . Not on file   Social History Narrative  . No narrative on file   Review of Systems  Constitutional: Negative for fatigue and unexpected weight change.       Wears seat belt  HENT: Positive for congestion and rhinorrhea. Negative  for hearing loss, dental problem and tinnitus.        Regular with dentist  Eyes: Negative for visual disturbance.       No diplopia or unilateral vision loss  Respiratory: Negative for cough, chest tightness and shortness of breath.   Cardiovascular: Negative for chest pain, palpitations and leg swelling.  Gastrointestinal: Negative for nausea, vomiting, abdominal pain, constipation and blood in stool.       No heartburn  Endocrine: Negative for cold intolerance and heat intolerance.  Genitourinary: Positive for frequency. Negative for urgency and difficulty urinating.       Only rarely needs the cialis now on androgel  Musculoskeletal: Positive for arthralgias. Negative for back pain and joint swelling.       Occ hip pain  Skin: Negative for pallor and rash.       No suspicious lesions  Allergic/Immunologic: Positive for environmental allergies. Negative for immunocompromised state.       Mild allergies --no meds  Neurological: Positive for numbness and headaches. Negative for dizziness, syncope, weakness and light-headedness.       Occ migraines--axert helps if he gets it early enough  Hematological: Negative for adenopathy. Does not bruise/bleed easily.  Psychiatric/Behavioral: Negative for sleep disturbance and dysphoric mood. The patient is not nervous/anxious.        Objective:   Physical Exam  Constitutional: He is oriented to person, place, and time. He appears well-developed and well-nourished. No distress.  HENT:  Head: Normocephalic and atraumatic.  Right Ear: External ear normal.  Left Ear: External ear normal.  Mouth/Throat: Oropharynx is clear and moist. No oropharyngeal exudate.  Eyes: Conjunctivae and EOM are normal. Pupils are equal, round, and reactive to light.  Neck: Normal range of motion. Neck supple. No thyromegaly present.  Cardiovascular: Normal rate, regular rhythm, normal heart sounds and intact distal pulses.  Exam reveals no gallop.   No murmur  heard. Pulmonary/Chest: Effort normal and breath sounds normal. No respiratory distress. He has no wheezes. He has no rales.  Abdominal: Soft. There is no tenderness.  Musculoskeletal: He exhibits no edema and no tenderness.  Lymphadenopathy:    He has no cervical adenopathy.  Neurological: He is alert and oriented to person, place, and  time.  Skin: No rash noted. No erythema.  Psychiatric: He has a normal mood and affect. His behavior is normal.          Assessment & Plan:

## 2012-12-24 NOTE — Assessment & Plan Note (Signed)
No problems with the statin 

## 2012-12-25 ENCOUNTER — Encounter: Payer: Self-pay | Admitting: Internal Medicine

## 2012-12-25 ENCOUNTER — Other Ambulatory Visit: Payer: Self-pay | Admitting: Internal Medicine

## 2012-12-25 LAB — CBC WITH DIFFERENTIAL/PLATELET
Eos: 1 % (ref 0–5)
Immature Grans (Abs): 0 10*3/uL (ref 0.0–0.1)
Immature Granulocytes: 0 % (ref 0–2)
Lymphs: 27 % (ref 14–46)
MCV: 90 fL (ref 79–97)
Monocytes Absolute: 0.7 10*3/uL (ref 0.1–0.9)
Monocytes: 8 % (ref 4–12)
Neutrophils Relative %: 64 % (ref 40–74)
RDW: 12.7 % (ref 12.3–15.4)
WBC: 9.3 10*3/uL (ref 3.4–10.8)

## 2012-12-25 LAB — HEPATIC FUNCTION PANEL
AST: 33 IU/L (ref 0–40)
Albumin: 4.3 g/dL (ref 3.5–5.5)

## 2012-12-25 LAB — BASIC METABOLIC PANEL
CO2: 24 mmol/L (ref 19–28)
Calcium: 9.6 mg/dL (ref 8.7–10.2)
Chloride: 98 mmol/L (ref 97–108)
Glucose: 101 mg/dL — ABNORMAL HIGH (ref 65–99)
Potassium: 4.4 mmol/L (ref 3.5–5.2)

## 2012-12-25 LAB — LIPID PANEL: Cholesterol, Total: 143 mg/dL (ref 100–199)

## 2012-12-25 LAB — HEMOGLOBIN A1C
Est. average glucose Bld gHb Est-mCnc: 217 mg/dL
Hgb A1c MFr Bld: 9.2 % — ABNORMAL HIGH (ref 4.8–5.6)

## 2013-01-07 ENCOUNTER — Other Ambulatory Visit (INDEPENDENT_AMBULATORY_CARE_PROVIDER_SITE_OTHER): Payer: 59

## 2013-01-07 DIAGNOSIS — Z1211 Encounter for screening for malignant neoplasm of colon: Secondary | ICD-10-CM

## 2013-01-24 ENCOUNTER — Encounter: Payer: Self-pay | Admitting: Internal Medicine

## 2013-01-27 ENCOUNTER — Telehealth: Payer: Self-pay | Admitting: *Deleted

## 2013-01-27 NOTE — Telephone Encounter (Signed)
Pt is unable to come to their July 30th appt, due to family vacation. Please re-schedule and call pt.

## 2013-02-01 ENCOUNTER — Other Ambulatory Visit: Payer: Self-pay | Admitting: Internal Medicine

## 2013-02-05 ENCOUNTER — Ambulatory Visit: Payer: 59 | Admitting: Internal Medicine

## 2013-02-14 ENCOUNTER — Other Ambulatory Visit: Payer: Self-pay | Admitting: Internal Medicine

## 2013-02-14 NOTE — Telephone Encounter (Signed)
Okay #6 x 5

## 2013-03-05 ENCOUNTER — Ambulatory Visit: Payer: 59 | Admitting: Internal Medicine

## 2013-03-19 ENCOUNTER — Encounter: Payer: Self-pay | Admitting: Internal Medicine

## 2013-03-19 ENCOUNTER — Ambulatory Visit (INDEPENDENT_AMBULATORY_CARE_PROVIDER_SITE_OTHER): Payer: 59 | Admitting: Internal Medicine

## 2013-03-19 VITALS — BP 114/68 | HR 98 | Temp 98.2°F | Resp 12 | Ht 67.25 in | Wt 201.0 lb

## 2013-03-19 LAB — HM DIABETES FOOT EXAM

## 2013-03-19 MED ORDER — "PEN NEEDLES 3/16"" 31G X 5 MM MISC": 1.0000 | Freq: Every day | Status: DC

## 2013-03-19 MED ORDER — INSULIN PEN NEEDLE 32G X 4 MM MISC: Status: DC

## 2013-03-19 MED ORDER — CANAGLIFLOZIN 100 MG PO TABS: 1.0000 | ORAL_TABLET | Freq: Every day | ORAL | Status: DC

## 2013-03-19 NOTE — Patient Instructions (Addendum)
Please start checking sugars 1-2x a day and write them down - Please return in 1 month with your sugar log.  Stop Glipizide. Start Invokana 100 mg daily before breakfast. Inject Lantus in the abdomen - use smaller pen needles (sent to your pharmacy). Continue Januvia and Metformin. Try to skip the starch at dinner.  Eat a breakfast (however small) twice a day.  E.g. of snacks: * soy or almond milk * veggies with humus or other low calorie/low fat dip * low glycemic index fruits (higher glycemic index = higher risk to increase your sugars):          http://www.health.https://www.brown.info/ * fruit/veggie smoothies          Ninja blender recipes:          [email or web address] * unsalted nuts Etc.  PATIENT INSTRUCTIONS FOR TYPE 2 DIABETES:  DIET AND EXERCISE Diet and exercise is an important part of diabetic treatment.  We recommended aerobic exercise in the form of brisk walking (working between 40-60% of maximal aerobic capacity, similar to brisk walking) for 150 minutes per week (such as 30 minutes five days per week) along with 3 times per week performing 'resistance' training (using various gauge rubber tubes with handles) 5-10 exercises involving the major muscle groups (upper body, lower body and core) performing 10-15 repetitions (or near fatigue) each exercise. Start at half the above goal but build slowly to reach the above goals. If limited by weight, joint pain, or disability, we recommend daily walking in a swimming pool with water up to waist to reduce pressure from joints while allow for adequate exercise.    BLOOD GLUCOSES Monitoring your blood glucoses is important for continued management of your diabetes. Please check your blood glucoses 2-4 times a day: fasting, before meals and at bedtime (you can rotate these measurements - e.g. one day check before the 3 meals, the next day  check before 2 of the meals and before bedtime, etc.   HYPOGLYCEMIA (low blood sugar) Hypoglycemia is usually a reaction to not eating, exercising, or taking too much insulin/ other diabetes drugs.  Symptoms include tremors, sweating, hunger, confusion, headache, etc. Treat IMMEDIATELY with 15 grams of Carbs:   4 glucose tablets    cup regular juice/soda   2 tablespoons raisins   4 teaspoons sugar   1 tablespoon honey Recheck blood glucose in 15 mins and repeat above if still symptomatic/blood glucose <100. Please contact our office at [phone] if you have questions about how to next handle your insulin.  RECOMMENDATIONS TO REDUCE YOUR RISK OF DIABETIC COMPLICATIONS: * Take your prescribed MEDICATION(S). * Follow a DIABETIC diet: Complex carbs, fiber rich foods, heart healthy fish twice weekly, (monounsaturated and polyunsaturated) fats * AVOID saturated/trans fats, high fat foods, >2,300 mg salt per day. * EXERCISE at least 5 times a week for 30 minutes or preferably daily.  * DO NOT SMOKE OR DRINK more than 1 drink a day. * Check your FEET every day. Do not wear tightfitting shoes. Contact us if you develop an ulcer * See your EYE doctor once a year or more if needed * Get a FLU shot once a year * Get a PNEUMONIA vaccine once before and once after age 47 years  GOALS:  * Your Hemoglobin A1c of <7%  * fasting sugars need to be <130 * after meals sugars need to be <180 (2h after you start eating) * Your Systolic BP should be 140 or lower  * Your Diastolic BP should  be 80 or lower  * Your HDL (Good Cholesterol) should be 40 or higher  * Your LDL (Bad Cholesterol) should be 100 or lower  * Your Triglycerides should be 150 or lower  * Your Urine microalbumin (kidney function) should be <30 * Your Body Mass Index should be 25 or lower   We will be glad to help you achieve these goals. Our telephone number is: [phone].

## 2013-03-19 NOTE — Progress Notes (Signed)
Patient ID: Calvin Smith, male   DOB: July 25, 1961, 52 y.o.   MRN: [id number]  HPI: Calvin Smith is a 52 y.o.-year-old male, referred by his PCP, Dr. Alphonsus Sias, for management of DM2, insulin-dependent, uncontrolled, with complications (ED).  Patient has been diagnosed with diabetes in ~2010; he started insulin in past year. Last hemoglobin A1c was: Lab Results  Component Value Date   HGBA1C 9.2* 12/24/2012  at last visit with PCP. Before this check, he had a long period of dietary noncompliance with subsequent increased sugars and weigh gain, and the started to lose weight and sugars improved soon before the appt in 12/2012. His A1C was prev 8.6%, prev 9.2%, prev 10.2%.   Pt is on a regimen of: - Metformin 1000 mg po bid - Lantus 58 units qhs - pen - injects in upper arms - Januvia 100 mg in am - Glipizide XL 5 mg daily  Pt checks his sugars 0-1x a day and they are: - am: 150-175. Highest (seldom) 200. No lows. Lowest sugar was 120; he has hypoglycemia awareness at 100. Highest sugar was 200.   Pt's meals are: - Breakfast: skips usually - Lunch: sandwich, he takes this from home; occasionally leftovers - Dinner: meat + veggies + starch - Snacks: crackers/protein bars - 10 am, normally no snack in pm., but second one at 9 pm He is walking 2 mi a day, 3-4 x a week.  - mild CKD, last BUN/creatinine:  Lab Results  Component Value Date   BUN 13 12/24/2012   CREATININE 1.04 12/24/2012  He is on Lisinopril 20.   - last set of lipids: Lab Results  Component Value Date   HDL 46 12/24/2012   LDLCALC 82 12/24/2012   TRIG 75 12/24/2012   CHOLHDL 3.1 12/24/2012  He is on Lipitor 10.  - last eye exam was last 05-01/2012. No DR.  - no numbness and tingling in his feet. He is on ASA 81.  I reviewed his chart and he also has a history of hypogonadism/ED (on Androgel), HTN, HL, OSA, HA.  Pt has no FH of DM.  ROS: Constitutional: no weight gain/loss, + fatigue, no subjective  hyperthermia/hypothermia; nocturia x1 Eyes: no blurry vision, no xerophthalmia ENT: no sore throat, no nodules palpated in throat, no dysphagia/odynophagia, no hoarseness Cardiovascular: no CP/SOB/palpitations/leg swelling Respiratory: no cough/SOB Gastrointestinal: no N/V/D/C Musculoskeletal: no muscle/joint aches Skin: no rashes Neurological: no tremors/numbness/tingling/dizziness, + HA Psychiatric: no depression/anxiety diffic with erections  Past Medical History  Diagnosis Date  . Allergic rhinitis   . Hypertension   . Hyperlipidemia   . Migraines   . Sleep apnea   . Erectile dysfunction   . Diabetes mellitus     type 2  . Arm fracture, left as a child  . DeQuervain's disease (tenosynovitis)     right wrist   Past Surgical History  Procedure Laterality Date  . Wrist surgery  1/04    DeQuervain release  . Carpal tunnel release  9/03    left   . Cardiolite  5/06    Negative EF 66%  . Wrist surgery  1/08    ORIF l wrist DVR plate screwhead autologous   History   Social History  . Marital Status: Married    Spouse Name: N/A    Number of Children: N/A  . 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 Smoking  status: Never Smoker   . Smokeless tobacco: Never Used  . Alcohol Use: Yes     Comment: rare  . Drug Use: No  . Sexual Activity: Yes    Partners: Female   Other Topics Concern  . Not on file   Social History Narrative   Regular exercise: light to moderate   Caffeine use: coffee daily   Current Outpatient Prescriptions on File Prior to Visit  Medication Sig Dispense Refill  . Alcohol Swabs (ALCOHOL WIPES) 70 % PADS Use as instructed to test blood sugar two times a day dx 250.00  200 each  3  . aspirin EC 81 MG tablet Take 81 mg by mouth daily.      Marland Kitchen atorvastatin (LIPITOR) 10 MG tablet Take 1 tablet (10 mg total) by mouth daily.  90 tablet  3  . glipiZIDE (GLIPIZIDE XL) 5  MG 24 hr tablet Take 1 tablet (5 mg total) by mouth daily.  90 tablet  3  . glucose blood (ONE TOUCH TEST STRIPS) test strip Use as instructed to test blood sugar two times a day dx 250.00       . Lancets (ONETOUCH ULTRASOFT) lancets Use as instructed to test blood sugar two times a day dx 250.00       . LANTUS SOLOSTAR 100 UNIT/ML SOPN Inject subcutaneously 58  units at bedtime  19 pen  3  . lisinopril (PRINIVIL,ZESTRIL) 20 MG tablet Take 1 tablet (20 mg total) by mouth daily.  90 tablet  3  . metFORMIN (GLUCOPHAGE) 1000 MG tablet Take 1 tablet (1,000 mg total) by mouth 2 (two) times daily with a meal.  180 tablet  3  . rizatriptan (MAXALT) 10 MG tablet TAKE ONE TABLET BY MOUTH AS NEEDED FOR MIGRAINE. MAY REPEAT IN TWO HOURS IF NEEDED  6 tablet  0  . sitaGLIPtin (JANUVIA) 100 MG tablet Take 1 tablet (100 mg total) by mouth daily.  90 tablet  3  . tadalafil (CIALIS) 20 MG tablet Take 1 tablet (20 mg total) by mouth daily as needed for erectile dysfunction.  6 tablet  6  . testosterone (ANDROGEL) 50 MG/5GM GEL Place 5 g onto the skin daily.  3 Tube  3   No current facility-administered medications on file prior to visit.   Allergies  Allergen Reactions  . Bisoprolol-Hydrochlorothiazide     REACTION: E.D.  . Codeine     REACTION: itch   Family History  Problem Relation Age of Onset  . Hypertension Father   . Hypertension Paternal Grandfather   . Lymphoma Father     non-hodgkins  . Lung cancer Paternal Aunt   . Coronary artery disease Paternal Uncle   . Colon cancer      in 1 relative (?aunt)  . Uterine cancer Maternal Grandmother    PE: BP 114/68  Pulse 98  Temp(Src) 98.2 F (36.8 C) (Oral)  Resp 12  Ht 5' 7.25" (1.708 m)  Wt 201 lb (91.173 kg)  BMI 31.25 kg/m2  SpO2 95% Wt Readings from Last 3 Encounters:  03/19/13 201 lb (91.173 kg)  12/24/12 200 lb 12 oz (91.06 kg)  06/26/12 203 lb (92.08 kg)   Constitutional: overweight, in NAD Eyes: PERRLA, EOMI, no  exophthalmos ENT: moist mucous membranes, no thyromegaly, no cervical lymphadenopathy Cardiovascular: RRR, No MRG Respiratory: CTA B Gastrointestinal: abdomen soft, NT, ND, BS+ Musculoskeletal: no deformities, strength intact in all 4 Skin: moist, warm, no rashes Neurological: no tremor with outstretched hands, DTR normal  in all 4 Foot exam performed today  ASSESSMENT: 1. DM2, insulin-dependent, uncontrolled, with complications - ED  PLAN:  1. Patient with uncontrolled diabetes, on oral antidiabetic regimen + basal insulin, which became insufficient - we discussed first about proper injection techniques, to make sure he gets the max benefit from his Lantus. He injects in the arms, which is not ideal, and he also uses long needles, which are not ideal, since they can promote inj in the mm.he also does not keep in needle for >6 sec after last drop of insulin is in. I advised him proper inj technique and called in shorter needles. - We discussed about options for treatment, and I suggested to:  Continue Metfomin 1000 mg bid Continue januvia 100 mg daily Continue Lantus 58 unit in hs but adjust injections as above Stop Glipizide XL Start Invokana 100 mg daily; we discussed about SEs of Invokana, which are: dizziness (advised to be careful when stands from sitting position), decreased BP - usually not < normal (BP today is not low), and fungal UTIs (advised to let me know if develops one). Given discount card for Invokana. - we also talked at length about diet, given specific recs and examples about how to improve his diet - partially in the pt instructions section - advised him to start checking sugars at different times of the day - check 1-2 times a day, rotating checks - given sugar log and advised how to fill it and to bring it at next appt  - given foot care handout and explained the principles  - given instructions for hypoglycemia management "15-15 rule"  - advised for yearly eye  exams, he has one coming up - Return to clinic in one month with sugar log

## 2013-03-20 ENCOUNTER — Other Ambulatory Visit: Payer: Self-pay | Admitting: Internal Medicine

## 2013-03-20 ENCOUNTER — Encounter: Payer: Self-pay | Admitting: Internal Medicine

## 2013-03-20 MED ORDER — INSULIN GLARGINE 100 UNIT/ML SOLOSTAR PEN: 58.0000 [IU] | PEN_INJECTOR | Freq: Every day | SUBCUTANEOUS | Status: DC

## 2013-04-30 ENCOUNTER — Encounter: Payer: Self-pay | Admitting: Internal Medicine

## 2013-04-30 ENCOUNTER — Ambulatory Visit (INDEPENDENT_AMBULATORY_CARE_PROVIDER_SITE_OTHER): Payer: 59 | Admitting: Internal Medicine

## 2013-04-30 ENCOUNTER — Other Ambulatory Visit: Payer: Self-pay | Admitting: *Deleted

## 2013-04-30 VITALS — BP 118/64 | HR 103 | Temp 98.5°F | Resp 12 | Wt 193.0 lb

## 2013-04-30 LAB — HEMOGLOBIN A1C: Hgb A1c MFr Bld: 7.7 % — ABNORMAL HIGH (ref 4.6–6.5)

## 2013-04-30 MED ORDER — INSULIN PEN NEEDLE 32G X 4 MM MISC: Status: DC

## 2013-04-30 MED ORDER — CANAGLIFLOZIN 100 MG PO TABS: 1.0000 | ORAL_TABLET | Freq: Every day | ORAL | Status: DC

## 2013-04-30 NOTE — Patient Instructions (Signed)
Keep up the great work! Please return in 3 months with your sugar log.  Continue current diabetic regimen.

## 2013-04-30 NOTE — Progress Notes (Signed)
Patient ID: Calvin Smith, male   DOB: Jul 12, 1961, 51 y.o.   MRN: [id number]  HPI: Calvin Smith is a 52 y.o.-year-old male, referred by his PCP, Dr. Alphonsus Sias, for management of DM2, dx 2010, insulin-dependent since 2014, uncontrolled, with complications (ED). Last visit 1.5 mo ago.  Last hemoglobin A1c was: Lab Results  Component Value Date   HGBA1C 9.2* 12/24/2012  at last visit with PCP. Before this check, he had a long period of dietary noncompliance with subsequent increased sugars and weigh gain, and the started to lose weight and sugars improved soon before the appt in 12/2012. His A1C was prev 8.6%, prev 9.2%, prev 10.2%.   Pt is on a regimen of: - Metformin 1000 mg po bid - Lantus 58 units qhs - pen - used to inject in upper arms, now in abdomen - Januvia 100 mg in am - Glipizide XL 5 mg daily  Pt checks his sugars 0-1x a day and they are: - am: 150-175. Highest (seldom) 200 >> ~130 - before lunch: ~130 - before dinner: <130 - after dinner: 170s No lows; he has hypoglycemia awareness at 100. Highest sugar was <200.  Pt's meals are: - Breakfast: does not skip anymore - Lunch: sandwich, he takes this from home; occasionally leftovers - Dinner: meat + veggies + starch >> decreased portions - Snacks: crackers/protein bars - 10 am, usually no more snacks after dinner He is still walking 2 mi a day, 3-4 x a week.  - mild CKD, last BUN/creatinine:  Lab Results  Component Value Date   BUN 13 12/24/2012   CREATININE 1.04 12/24/2012  He is on Lisinopril 20.   - last set of lipids: Lab Results  Component Value Date   HDL 46 12/24/2012   LDLCALC 82 12/24/2012   TRIG 75 12/24/2012   CHOLHDL 3.1 12/24/2012  He is on Lipitor 10.  - last eye exam was last 04/22/2013. No DR.  - no numbness and tingling in his feet. He is on ASA 81.  ROS: Constitutional: no weight gain/loss, NO fatigue, no subjective hyperthermia/hypothermia Eyes: no blurry vision, no xerophthalmia ENT: no sore  throat, no nodules palpated in throat, no dysphagia/odynophagia, no hoarseness Cardiovascular: no CP/SOB/palpitations/leg swelling Respiratory: + cough/NO SOB Gastrointestinal: no N/V/D/C Musculoskeletal: no muscle/joint aches Skin: no rashes Neurological: no tremors/numbness/tingling/dizziness Psychiatric: no depression/anxiety diffic with erections  I reviewed pt's medications, allergies, PMH, social hx, family hx and no changes required, except as mentioned above.  PE: BP 118/64  Pulse 103  Temp(Src) 98.5 F (36.9 C) (Oral)  Resp 12  Wt 193 lb (87.544 kg)  BMI 30.01 kg/m2  SpO2 95% Wt Readings from Last 3 Encounters:  04/30/13 193 lb (87.544 kg)  03/19/13 201 lb (91.173 kg)  12/24/12 200 lb 12 oz (91.06 kg)   Constitutional: overweight, in NAD Eyes: PERRLA, EOMI, no exophthalmos ENT: moist mucous membranes, no thyromegaly, no cervical lymphadenopathy Cardiovascular: RRR, No MRG Respiratory: CTA B Gastrointestinal: abdomen soft, NT, ND, BS+ Musculoskeletal: no deformities, strength intact in all 4 Skin: moist, warm, no rashes Neurological: no tremor with outstretched hands, DTR normal in all 4  ASSESSMENT: 1. DM2, insulin-dependent, uncontrolled, with complications - ED  PLAN:  1. Patient with uncontrolled diabetes, with better control of sugars after improving diet and adding Invokana at last visit. He tolerates it well, no dizziness or polyuria. - at last visit, we discussed about proper injection techniques - I advised him about proper inj technique and called in shorter needles >>  he did not get the new needles yet and we will look into this - We discussed about options for treatment, and I suggested to:  Continue Metfomin 1000 mg bid Continue januvia 100 mg daily Continue Lantus 58 unit in hs   Continue Invokana 100 mg daily; (refilled for 90 days) - continue checking sugars at different times of the day - check 1-2 times a day, rotating checks - bring log next  time - up to date with eye exams - no DR - check HbA1C today - Will get flu vaccine at work in 05/2013 - Return to clinic in 3 months with sugar log   Office Visit on 04/30/2013  Component Date Value Range Status  . HM Diabetic Eye Exam 04/22/2013  04/22/2013.  No DR.   Final  . HM Diabetic Foot Exam 03/19/2013 yes   Final  . Hemoglobin A1C 04/30/2013 7.7* 4.6 - 6.5 % Final   Glycemic Control Guidelines for People with Diabetes:Non Diabetic:  <6%Goal of Therapy: <7%Additional Action Suggested:  >8%   HbA1c improved. Sent msg.

## 2013-04-30 NOTE — Telephone Encounter (Signed)
Pt stated he never received his rx for pen needles. Asked to send to [facility].

## 2013-05-18 ENCOUNTER — Other Ambulatory Visit: Payer: Self-pay | Admitting: Internal Medicine

## 2013-05-19 ENCOUNTER — Other Ambulatory Visit: Payer: Self-pay | Admitting: *Deleted

## 2013-05-19 MED ORDER — INSULIN GLARGINE 100 UNIT/ML SOLOSTAR PEN: 58.0000 [IU] | PEN_INJECTOR | Freq: Every day | SUBCUTANEOUS | Status: DC

## 2013-05-22 ENCOUNTER — Encounter: Payer: Self-pay | Admitting: Internal Medicine

## 2013-06-23 ENCOUNTER — Other Ambulatory Visit: Payer: Self-pay | Admitting: *Deleted

## 2013-06-23 MED ORDER — CANAGLIFLOZIN 100 MG PO TABS: 1.0000 | ORAL_TABLET | Freq: Every day | ORAL | Status: DC

## 2013-06-24 ENCOUNTER — Encounter: Payer: Self-pay | Admitting: Internal Medicine

## 2013-06-27 ENCOUNTER — Ambulatory Visit: Payer: 59 | Admitting: Internal Medicine

## 2013-07-21 ENCOUNTER — Other Ambulatory Visit: Payer: Self-pay | Admitting: Internal Medicine

## 2013-07-22 ENCOUNTER — Other Ambulatory Visit: Payer: Self-pay | Admitting: *Deleted

## 2013-07-22 MED ORDER — TESTOSTERONE 50 MG/5GM (1%) TD GEL: 5.0000 g | Freq: Every day | TRANSDERMAL | Status: DC

## 2013-07-22 NOTE — Telephone Encounter (Signed)
rx faxed to pharmacy [facility] 

## 2013-07-22 NOTE — Telephone Encounter (Signed)
Okay to refill for 6 months 

## 2013-07-23 ENCOUNTER — Encounter: Payer: Self-pay | Admitting: Internal Medicine

## 2013-07-23 ENCOUNTER — Ambulatory Visit (INDEPENDENT_AMBULATORY_CARE_PROVIDER_SITE_OTHER): Payer: 59 | Admitting: Internal Medicine

## 2013-07-23 VITALS — BP 114/64 | HR 93 | Temp 98.0°F | Resp 10 | Wt 193.8 lb

## 2013-07-23 MED ORDER — CANAGLIFLOZIN 300 MG PO TABS: 300.0000 mg | ORAL_TABLET | Freq: Every day | ORAL | Status: DC

## 2013-07-23 NOTE — Progress Notes (Signed)
Patient ID: Calvin Smith, male   DOB: 1960/08/26, 52 y.o.   MRN: [id number]  HPI: Calvin Smith is a 52 y.o.-year-old male, returning for f/u for DM2, dx 2010, insulin-dependent since 2014, uncontrolled, with complications (ED). Last visit 3 mo ago.  Last hemoglobin A1c was: Lab Results  Component Value Date   HGBA1C 7.7* 04/30/2013   HGBA1C 9.2* 12/24/2012   HGBA1C 8.6* 06/26/2012   Pt is on a regimen of: - Metformin 1000 mg po bid - Lantus 58 units qhs - pen - used to inject in upper arms, now in abdomen; does better with the shorter needles - Januvia 100 mg in am - Invokana 100 mg in am >> tolerates it well He was on Glipizide XL.   Pt checks his sugars 2x a day and they are: - am: 150-175. Highest (seldom) 200 >> ~130 >> 120-180 - before lunch: ~130 >> 150 - before dinner: <130 >> n/c - after dinner: 170s >> 160-170s, rarely higher No lows; he has hypoglycemia awareness at 100. Highest sugar was <200.  Pt's meals are: - Breakfast: eats it sometimes - Lunch: sandwich, he takes this from home; occasionally leftovers - Dinner: meat + veggies + starch >> decreased portions - Snacks: crackers/protein bars - 10 am, usually no more snacks after dinner He was still walking 2 mi a day, 3-4 x a week. >> now stopped in last month  - mild CKD, last BUN/creatinine:  Lab Results  Component Value Date   BUN 13 12/24/2012   CREATININE 1.04 12/24/2012  He is on Lisinopril 20.   - last set of lipids: Lab Results  Component Value Date   HDL 46 12/24/2012   LDLCALC 82 12/24/2012   TRIG 75 12/24/2012   CHOLHDL 3.1 12/24/2012  He is on Lipitor 10.  - last eye exam was last 04/22/2013. No DR.  - no numbness and tingling in his feet. He is on ASA 81.  ROS: Constitutional: no weight gain/loss, NO fatigue, no subjective hyperthermia/hypothermia Eyes: no blurry vision, no xerophthalmia ENT: no sore throat, no nodules palpated in throat, no dysphagia/odynophagia, no  hoarseness Cardiovascular: no CP/SOB/palpitations/leg swelling Respiratory: + cough and some congestion/NO SOB Gastrointestinal: no N/V/D/C Musculoskeletal: no muscle/joint aches Skin: no rashes Neurological: no tremors/numbness/tingling/dizziness  I reviewed pt's medications, allergies, PMH, social hx, family hx and no changes required, except as mentioned above.  PE: BP 114/64  Pulse 93  Temp(Src) 98 F (36.7 C) (Oral)  Resp 10  Wt 193 lb 12.8 oz (87.907 kg)  SpO2 95% Wt Readings from Last 3 Encounters:  07/23/13 193 lb 12.8 oz (87.907 kg)  04/30/13 193 lb (87.544 kg)  03/19/13 201 lb (91.173 kg)   Constitutional: overweight, in NAD Eyes: PERRLA, EOMI, no exophthalmos ENT: moist mucous membranes, no thyromegaly, no cervical lymphadenopathy Cardiovascular: RRR, No MRG Respiratory: CTA B Gastrointestinal: abdomen soft, NT, ND, BS+ Musculoskeletal: no deformities, strength intact in all 4 Skin: moist, warm, no rashes  ASSESSMENT: 1. DM2, insulin-dependent, uncontrolled, with complications - ED  PLAN:  1. Patient with uncontrolled diabetes, with better control of sugars after improving diet and adding Invokana, but now with worsened control after Thanksgiving after relaxing diet and stopping exercise. He tolerates it well, no dizziness or polyuria. - strongly advised him to restart exercising  -  I suggested to:  Continue Metfomin 1000 mg bid Continue januvia 100 mg daily Continue Lantus 58 unit in hs   Invokana Invokana 300 mg daily; (refilled for 90 days) -  continue checking sugars at different times of the day - check 2-3 times a day, rotating checks - bring log next time - up to date with eye exams - no DR - check HbA1C at next visit - he got flu vaccine at work in 05/2013 - Return to clinic in 2 months with sugar log

## 2013-07-23 NOTE — Patient Instructions (Signed)
Keep on the same regimen, but increase Invokana to 300 mg daily. Please return in 2 months with your sugar log.

## 2013-08-23 ENCOUNTER — Other Ambulatory Visit: Payer: Self-pay | Admitting: Internal Medicine

## 2013-08-25 ENCOUNTER — Other Ambulatory Visit: Payer: Self-pay | Admitting: *Deleted

## 2013-08-25 MED ORDER — INSULIN GLARGINE 100 UNIT/ML SOLOSTAR PEN: 58.0000 [IU] | PEN_INJECTOR | Freq: Every day | SUBCUTANEOUS | Status: DC

## 2013-10-23 ENCOUNTER — Other Ambulatory Visit: Payer: Self-pay | Admitting: Internal Medicine

## 2014-01-05 ENCOUNTER — Other Ambulatory Visit: Payer: Self-pay | Admitting: Internal Medicine

## 2014-02-17 ENCOUNTER — Telehealth: Payer: Self-pay | Admitting: Family Medicine

## 2014-02-17 NOTE — Telephone Encounter (Signed)
Diabetic Bundle.  Pt needs LDL check.  Pt declines appt at this time and will call back to schedule.

## 2014-03-06 ENCOUNTER — Other Ambulatory Visit: Payer: Self-pay | Admitting: Internal Medicine

## 2014-03-18 ENCOUNTER — Other Ambulatory Visit: Payer: Self-pay | Admitting: Internal Medicine

## 2014-03-18 ENCOUNTER — Other Ambulatory Visit: Payer: Self-pay | Admitting: *Deleted

## 2014-03-18 MED ORDER — INSULIN PEN NEEDLE 32G X 4 MM MISC: Status: DC

## 2014-04-06 ENCOUNTER — Other Ambulatory Visit: Payer: Self-pay | Admitting: Internal Medicine

## 2014-04-06 MED ORDER — BD SWAB SINGLE USE REGULAR PADS: MEDICATED_PAD | Status: DC

## 2014-04-06 MED ORDER — GLUCOSE BLOOD VI STRP: ORAL_STRIP | Status: DC

## 2014-04-06 NOTE — Telephone Encounter (Signed)
Sent patient message back thru my-chart, advised to call or resend message if pt has any questions. rx sent to pharmacy by e-script

## 2014-04-12 ENCOUNTER — Other Ambulatory Visit: Payer: Self-pay | Admitting: Internal Medicine

## 2014-04-14 ENCOUNTER — Encounter: Payer: Self-pay | Admitting: Internal Medicine

## 2014-04-14 ENCOUNTER — Ambulatory Visit (INDEPENDENT_AMBULATORY_CARE_PROVIDER_SITE_OTHER): Payer: 59 | Admitting: Internal Medicine

## 2014-04-14 VITALS — BP 110/70 | HR 75 | Temp 97.8°F | Wt 192.0 lb

## 2014-04-14 DIAGNOSIS — S20212A Contusion of left front wall of thorax, initial encounter: Secondary | ICD-10-CM | POA: Insufficient documentation

## 2014-04-14 DIAGNOSIS — S298XXA Other specified injuries of thorax, initial encounter: Secondary | ICD-10-CM | POA: Insufficient documentation

## 2014-04-14 DIAGNOSIS — S20219A Contusion of unspecified front wall of thorax, initial encounter: Secondary | ICD-10-CM

## 2014-04-14 NOTE — Progress Notes (Signed)
Subjective:    Patient ID: Calvin Smith, male    DOB: 1961/03/31, 53 y.o.   MRN: [id number]  HPI Broke a limb out of a tree--probably 5-6 inches in diameter Hit ground and bounced back and hit him in the right ribs Occurred 2 days ago [place] over the tree limb--knocked his breath out briefly Did try ice pack after this  He is very sore there Able to breath but it hurts with cough or sneeze Really hard getting in and out of bed  400mg  ibuprofen bid since then  Current Outpatient Prescriptions on File Prior to Visit  Medication Sig Dispense Refill  . Alcohol Swabs (B-D SINGLE USE SWABS REGULAR) PADS Use as instructed to test  blood sugar two times a day  200 each  3  . aspirin EC 81 MG tablet Take 81 mg by mouth daily.      Marland Kitchen atorvastatin (LIPITOR) 10 MG tablet Take one tablet daily. **MUST HAVE FOLLOW UP FOR FURTHER REFILLS**  30 tablet  0  . Canagliflozin 300 MG TABS Take 1 tablet (300 mg total) by mouth daily before breakfast.  90 tablet  3  . glipiZIDE (GLIPIZIDE XL) 5 MG 24 hr tablet Take 1 tablet (5 mg total) by mouth daily.  90 tablet  3  . glucose blood (ONE TOUCH TEST STRIPS) test strip Use as instructed to test blood sugar two times a day dx 250.00  200 each  3  . Insulin Pen Needle (PEN NEEDLES 3/16") 31G X 5 MM MISC 1 each by Does not apply route daily.  100 each  2  . Insulin Pen Needle (RELION PEN NEEDLES) 32G X 4 MM MISC Inject once a day under skin  100 each  3  . Lancets (ONETOUCH ULTRASOFT) lancets Use as instructed to test blood sugar two times a day dx 250.00       . LANTUS SOLOSTAR 100 UNIT/ML Solostar Pen Inject 58 units  subcutaneously at bedtime  60 mL  3  . lisinopril (PRINIVIL,ZESTRIL) 20 MG tablet Take 1 tablet (20 mg total) by mouth daily.  90 tablet  3  . metFORMIN (GLUCOPHAGE) 1000 MG tablet Take 1 tablet twice daily with a meal **MUST HAVE FOLLOW UP FOR FURTHER REFILLS**  60 tablet  0  . rizatriptan (MAXALT) 10 MG tablet TAKE ONE TABLET BY MOUTH AS  NEEDED FOR MIGRAINE. MAY REPEAT IN TWO HOURS IF NEEDED  6 tablet  0  . sitaGLIPtin (JANUVIA) 100 MG tablet Take one tablet daily **MUST HAVE FOLLOW UP FOR FURTHER REFILLS**  30 tablet  0  . tadalafil (CIALIS) 20 MG tablet Take 1 tablet (20 mg total) by mouth daily as needed for erectile dysfunction.  6 tablet  6  . testosterone (ANDROGEL) 50 MG/5GM GEL Place 5 g onto the skin daily.  3 Tube  3   No current facility-administered medications on file prior to visit.    Allergies  Allergen Reactions  . Bisoprolol-Hydrochlorothiazide     REACTION: E.D.  . Codeine     REACTION: itch    Past Medical History  Diagnosis Date  . Allergic rhinitis   . Hypertension   . Hyperlipidemia   . Migraines   . Sleep apnea   . Erectile dysfunction   . Diabetes mellitus     type 2  . Arm fracture, left as a child  . DeQuervain's disease (tenosynovitis)     right wrist    Past Surgical History  Procedure Laterality  Date  . Wrist surgery  1/04    DeQuervain release  . Carpal tunnel release  9/03    left   . Cardiolite  5/06    Negative EF 66%  . Wrist surgery  1/08    ORIF l wrist DVR plate screwhead autologous    Family History  Problem Relation Age of Onset  . Hypertension Father   . Hypertension Paternal Grandfather   . Lymphoma Father     non-hodgkins  . Lung cancer Paternal Aunt   . Coronary artery disease Paternal Uncle   . Colon cancer      in 1 relative (?aunt)  . Uterine cancer Maternal Grandmother     History   Social History  . Marital Status: Married    Spouse Name: N/A    Number of Children: N/A  . 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 Smoking status: Never Smoker   . Smokeless tobacco: Never Used  . Alcohol Use: Yes     Comment: rare  . Drug Use: No  . Sexual Activity: Yes    Partners: Female   Other Topics Concern  . Not on file   Social History Narrative    Regular exercise: light to moderate   Caffeine use: coffee daily   Review of Systems No fever No SOB but taking shallow breaths    Objective:   Physical Exam  Constitutional: He appears well-developed and well-nourished. No distress.  Neck: Normal range of motion. Neck supple.  Cardiovascular: Normal rate, regular rhythm and normal heart sounds.  Exam reveals no gallop and no friction rub.   No murmur heard. Pulmonary/Chest: Effort normal and breath sounds normal. No respiratory distress. He has no wheezes. He has no rales. He exhibits tenderness.  No dullness to percussion Marked tenderness along left T6-9 or so in axilla and more so anteriorly  No obvious rib irregularity or break  Lymphadenopathy:    He has no cervical adenopathy.          Assessment & Plan:

## 2014-04-14 NOTE — Patient Instructions (Signed)
Please try ibuprofen 600mg  three times a day as needed till the pain is better.

## 2014-04-14 NOTE — Progress Notes (Signed)
Pre visit review using our clinic review tool, if applicable. No additional management support is needed unless otherwise documented below in the visit note. 

## 2014-04-14 NOTE — Assessment & Plan Note (Signed)
No evidence of fracture or lung involvement Discussed supportive care and likelihood of long standing symptoms Ibuprofen 600 tid prn

## 2014-05-06 ENCOUNTER — Encounter: Payer: Self-pay | Admitting: Internal Medicine

## 2014-05-11 ENCOUNTER — Encounter: Payer: Self-pay | Admitting: Internal Medicine

## 2014-05-11 ENCOUNTER — Ambulatory Visit (INDEPENDENT_AMBULATORY_CARE_PROVIDER_SITE_OTHER): Payer: 59 | Admitting: Internal Medicine

## 2014-05-11 ENCOUNTER — Other Ambulatory Visit: Payer: Self-pay

## 2014-05-11 VITALS — BP 118/68 | HR 92 | Temp 98.8°F | Resp 14 | Ht 66.3 in | Wt 188.8 lb

## 2014-05-11 DIAGNOSIS — E785 Hyperlipidemia, unspecified: Secondary | ICD-10-CM

## 2014-05-11 DIAGNOSIS — Z0000 Encounter for general adult medical examination without abnormal findings: Secondary | ICD-10-CM

## 2014-05-11 DIAGNOSIS — E119 Type 2 diabetes mellitus without complications: Secondary | ICD-10-CM

## 2014-05-11 DIAGNOSIS — N528 Other male erectile dysfunction: Secondary | ICD-10-CM

## 2014-05-11 DIAGNOSIS — N529 Male erectile dysfunction, unspecified: Secondary | ICD-10-CM

## 2014-05-11 DIAGNOSIS — I10 Essential (primary) hypertension: Secondary | ICD-10-CM

## 2014-05-11 MED ORDER — SILDENAFIL CITRATE 20 MG PO TABS: 60.0000 mg | ORAL_TABLET | Freq: Every day | ORAL | Status: DC | PRN

## 2014-05-11 MED ORDER — INSULIN GLARGINE 100 UNIT/ML SOLOSTAR PEN: PEN_INJECTOR | SUBCUTANEOUS | Status: DC

## 2014-05-11 MED ORDER — LISINOPRIL 20 MG PO TABS: ORAL_TABLET | ORAL | Status: DC

## 2014-05-11 MED ORDER — ATORVASTATIN CALCIUM 10 MG PO TABS: ORAL_TABLET | ORAL | Status: DC

## 2014-05-11 MED ORDER — CANAGLIFLOZIN 300 MG PO TABS: 300.0000 mg | ORAL_TABLET | Freq: Every day | ORAL | Status: DC

## 2014-05-11 MED ORDER — SITAGLIPTIN PHOSPHATE 100 MG PO TABS: ORAL_TABLET | ORAL | Status: DC

## 2014-05-11 MED ORDER — METFORMIN HCL 1000 MG PO TABS: ORAL_TABLET | ORAL | Status: DC

## 2014-05-11 MED ORDER — RIZATRIPTAN BENZOATE 10 MG PO TABS: 10.0000 mg | ORAL_TABLET | Freq: Every day | ORAL | Status: DC | PRN

## 2014-05-11 NOTE — Assessment & Plan Note (Signed)
Will try change to sildenafil

## 2014-05-11 NOTE — Progress Notes (Signed)
Subjective:    Patient ID: Calvin Smith, male    DOB: 25-Nov-1960, 53 y.o.   MRN: [id number]  HPI Here for physical Rib is better but he can still feel it---like with bending or lifting. No longer limiting him  Still seeing Calvin Cruzita Lederer but missed appt [place] said she wasn't on plan and not here anymore He will call to reschedule  Checking every morning Usually 130-150 Due for eye exam-- ready to schedule No sores or pain in feet No set exercise--but busy in yard in warm weather  Current Outpatient Prescriptions on File Prior to Visit  Medication Sig Dispense Refill  . Alcohol Swabs (B-D SINGLE USE SWABS REGULAR) PADS Use as instructed to test  blood sugar two times a day  200 each  3  . aspirin EC 81 MG tablet Take 81 mg by mouth daily.      Marland Kitchen atorvastatin (LIPITOR) 10 MG tablet Take 1 tablet by mouth  daily  90 tablet  0  . Canagliflozin 300 MG TABS Take 1 tablet (300 mg total) by mouth daily before breakfast.  90 tablet  3  . glipiZIDE (GLIPIZIDE XL) 5 MG 24 hr tablet Take 1 tablet (5 mg total) by mouth daily.  90 tablet  3  . glucose blood (ONE TOUCH TEST STRIPS) test strip Use as instructed to test blood sugar two times a day dx 250.00  200 each  3  . Insulin Pen Needle (PEN NEEDLES 3/16") 31G X 5 MM MISC 1 each by Does not apply route daily.  100 each  2  . Insulin Pen Needle (RELION PEN NEEDLES) 32G X 4 MM MISC Inject once a day under skin  100 each  3  . Lancets (ONETOUCH ULTRASOFT) lancets Use as instructed to test blood sugar two times a day dx 250.00       . LANTUS SOLOSTAR 100 UNIT/ML Solostar Pen Inject 58 units  subcutaneously at bedtime  60 mL  3  . lisinopril (PRINIVIL,ZESTRIL) 20 MG tablet Take 1 tablet (20 mg total) by mouth daily.  90 tablet  3  . metFORMIN (GLUCOPHAGE) 1000 MG tablet Take 1 tablet twice daily with a meal **MUST HAVE FOLLOW UP FOR FURTHER REFILLS**  60 tablet  0  . rizatriptan (MAXALT) 10 MG tablet TAKE ONE TABLET BY MOUTH AS NEEDED FOR MIGRAINE.  MAY REPEAT IN TWO HOURS IF NEEDED  6 tablet  0  . sitaGLIPtin (JANUVIA) 100 MG tablet Take one tablet daily **MUST HAVE FOLLOW UP FOR FURTHER REFILLS**  30 tablet  0  . tadalafil (CIALIS) 20 MG tablet Take 1 tablet (20 mg total) by mouth daily as needed for erectile dysfunction.  6 tablet  6  . testosterone (ANDROGEL) 50 MG/5GM GEL Place 5 g onto the skin daily.  3 Tube  3   No current facility-administered medications on file prior to visit.    Allergies  Allergen Reactions  . Bisoprolol-Hydrochlorothiazide     REACTION: E.D.  . Codeine     REACTION: itch    Past Medical History  Diagnosis Date  . Allergic rhinitis   . Hypertension   . Hyperlipidemia   . Migraines   . Sleep apnea   . Erectile dysfunction   . Diabetes mellitus     type 2  . Arm fracture, left as a child  . DeQuervain's disease (tenosynovitis)     right wrist    Past Surgical History  Procedure Laterality Date  . Wrist surgery  1/04    DeQuervain release  . Carpal tunnel release  9/03    left   . Cardiolite  5/06    Negative EF 66%  . Wrist surgery  1/08    ORIF l wrist DVR plate screwhead autologous    Family History  Problem Relation Age of Onset  . Hypertension Father   . Hypertension Paternal Grandfather   . Lymphoma Father     non-hodgkins  . Lung cancer Paternal Aunt   . Coronary artery disease Paternal Uncle   . Colon cancer      in 1 relative (?aunt)  . Uterine cancer Maternal Grandmother     History   Social History  . Marital Status: Married    Spouse Name: N/A    Number of Children: N/A  . 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 Smoking status: Never Smoker   . Smokeless tobacco: Never Used  . Alcohol Use: Yes     Comment: rare  . Drug Use: No  . Sexual Activity: Yes    Partners: Female   Other Topics Concern  . Not on file   Social History Narrative   Regular exercise: light to  moderate   Caffeine use: coffee daily   Review of Systems  Constitutional: Negative for fatigue and unexpected weight change.       Wears seat belt  HENT: Negative for dental problem, hearing loss and tinnitus.        Regular with dentist  Eyes: Negative for visual disturbance.       No diplopia or unilateral vision loss  Respiratory: Negative for cough, chest tightness and shortness of breath.   Cardiovascular: Negative for chest pain, palpitations and leg swelling.  Gastrointestinal: Negative for nausea, vomiting, abdominal pain, constipation and blood in stool.       No heartburn  Endocrine: Positive for polyuria. Negative for polydipsia.       From invokana  Genitourinary: Positive for frequency. Negative for dysuria and difficulty urinating.       Nausea with cialis--- even cutting in half. Wants to try something else  Musculoskeletal: Negative for arthralgias, back pain and joint swelling.  Skin: Positive for rash.       Itchy rash in summer---better now  Allergic/Immunologic: Negative for environmental allergies and immunocompromised state.  Neurological: Positive for headaches. Negative for dizziness, syncope, weakness and numbness.       Occasional mild headaches--migraines only rarely.  Hematological: Negative for adenopathy. Does not bruise/bleed easily.  Psychiatric/Behavioral: Negative for sleep disturbance and dysphoric mood. The patient is not nervous/anxious.        Objective:   Physical Exam  Constitutional: He appears well-developed and well-nourished. No distress.  HENT:  Head: Normocephalic and atraumatic.  Right Ear: External ear normal.  Left Ear: External ear normal.  Mouth/Throat: Oropharynx is clear and moist. No oropharyngeal exudate.  Eyes: Conjunctivae and EOM are normal. Pupils are equal, round, and reactive to light.  Neck: Normal range of motion. Neck supple. No thyromegaly present.  Cardiovascular: Normal rate, regular rhythm, normal heart  sounds and intact distal pulses.  Exam reveals no gallop.   No murmur heard. Pulmonary/Chest: Effort normal and breath sounds normal. No respiratory distress. He has no wheezes. He has no rales.  Abdominal: Soft. He exhibits no distension. There is no tenderness. There is no rebound and no guarding.  Musculoskeletal: He  exhibits no edema and no tenderness.  Lymphadenopathy:    He has no cervical adenopathy.  Neurological:  Normal sensation on plantar feet  Skin: No rash noted. No erythema.  No foot lesions  Psychiatric: He has a normal mood and affect. His behavior is normal.          Assessment & Plan:

## 2014-05-11 NOTE — Assessment & Plan Note (Signed)
No problems with statin 

## 2014-05-11 NOTE — Assessment & Plan Note (Signed)
Hopefully still controlled He will call to schedule with Dr Cruzita Lederer

## 2014-05-11 NOTE — Assessment & Plan Note (Signed)
Got flu shot Will check PSA after discussion Discussed fitness

## 2014-05-11 NOTE — Assessment & Plan Note (Signed)
BP Readings from Last 3 Encounters:  05/11/14 118/68  04/14/14 110/70  07/23/13 114/64   Good control Due for labs

## 2014-05-12 ENCOUNTER — Telehealth: Payer: Self-pay | Admitting: Internal Medicine

## 2014-05-12 LAB — COMPREHENSIVE METABOLIC PANEL
A/G RATIO: 1.9 (ref 1.1–2.5)
ALT: 37 IU/L (ref 0–44)
AST: 23 IU/L (ref 0–40)
Albumin: 5 g/dL (ref 3.5–5.5)
Alkaline Phosphatase: 77 IU/L (ref 39–117)
BILIRUBIN TOTAL: 0.7 mg/dL (ref 0.0–1.2)
BUN/Creatinine Ratio: 15 (ref 9–20)
BUN: 17 mg/dL (ref 6–24)
CO2: 23 mmol/L (ref 18–29)
Calcium: 9.8 mg/dL (ref 8.7–10.2)
Chloride: 93 mmol/L — ABNORMAL LOW (ref 97–108)
Creatinine, Ser: 1.15 mg/dL (ref 0.76–1.27)
GFR, EST AFRICAN AMERICAN: 84 mL/min/{1.73_m2} (ref >59)
GFR, EST NON AFRICAN AMERICAN: 72 mL/min/{1.73_m2} (ref >59)
Globulin, Total: 2.7 g/dL (ref 1.5–4.5)
Glucose: 116 mg/dL — ABNORMAL HIGH (ref 65–99)
POTASSIUM: 4.3 mmol/L (ref 3.5–5.2)
SODIUM: 138 mmol/L (ref 134–144)
Total Protein: 7.7 g/dL (ref 6.0–8.5)

## 2014-05-12 LAB — CBC WITH DIFFERENTIAL/PLATELET
BASOS ABS: 0 10*3/uL (ref 0.0–0.2)
Basos: 0 %
Eos: 1 %
Eosinophils Absolute: 0.1 10*3/uL (ref 0.0–0.4)
HCT: 47.8 % (ref 37.5–51.0)
Hemoglobin: 16.9 g/dL (ref 12.6–17.7)
Immature Grans (Abs): 0 10*3/uL (ref 0.0–0.1)
Immature Granulocytes: 0 %
LYMPHS: 25 %
Lymphocytes Absolute: 2.7 10*3/uL (ref 0.7–3.1)
MCH: 32.4 pg (ref 26.6–33.0)
MCHC: 35.4 g/dL (ref 31.5–35.7)
MCV: 92 fL (ref 79–97)
MONOCYTES: 7 %
Monocytes Absolute: 0.7 10*3/uL (ref 0.1–0.9)
Neutrophils Absolute: 7.3 10*3/uL — ABNORMAL HIGH (ref 1.4–7.0)
Neutrophils Relative %: 67 %
RBC: 5.22 x10E6/uL (ref 4.14–5.80)
RDW: 13.4 % (ref 12.3–15.4)
WBC: 10.9 10*3/uL — ABNORMAL HIGH (ref 3.4–10.8)

## 2014-05-12 LAB — LIPID PANEL
CHOL/HDL RATIO: 4 ratio (ref 0.0–5.0)
Cholesterol, Total: 187 mg/dL (ref 100–199)
HDL: 47 mg/dL (ref >39)
LDL Calculated: 116 mg/dL — ABNORMAL HIGH (ref 0–99)
Triglycerides: 118 mg/dL (ref 0–149)
VLDL Cholesterol Cal: 24 mg/dL (ref 5–40)

## 2014-05-12 LAB — T4, FREE: FREE T4: 1.36 ng/dL (ref 0.82–1.77)

## 2014-05-12 LAB — HEMOGLOBIN A1C
ESTIMATED AVERAGE GLUCOSE: 177 mg/dL
Hgb A1c MFr Bld: 7.8 % — ABNORMAL HIGH (ref 4.8–5.6)

## 2014-05-12 LAB — PSA: PSA: 0.5 ng/mL (ref 0.0–4.0)

## 2014-05-12 NOTE — Telephone Encounter (Signed)
emmi emailed °

## 2014-05-13 ENCOUNTER — Encounter: Payer: Self-pay | Admitting: Internal Medicine

## 2014-05-13 MED ORDER — ATORVASTATIN CALCIUM 20 MG PO TABS: 20.0000 mg | ORAL_TABLET | Freq: Every day | ORAL | Status: DC

## 2014-05-21 ENCOUNTER — Other Ambulatory Visit: Payer: Self-pay

## 2014-05-21 MED ORDER — SITAGLIPTIN PHOSPHATE 100 MG PO TABS: ORAL_TABLET | ORAL | Status: DC

## 2014-05-21 MED ORDER — METFORMIN HCL 1000 MG PO TABS: ORAL_TABLET | ORAL | Status: DC

## 2014-05-21 NOTE — Telephone Encounter (Signed)
Pt left note requesting 30 day refill metformin and januvia to [facility] [address] while waiting on mail order med. Pt notified done and he will ck with pharmacy.

## 2014-07-23 ENCOUNTER — Encounter: Payer: Self-pay | Admitting: Family Medicine

## 2014-07-23 ENCOUNTER — Ambulatory Visit (INDEPENDENT_AMBULATORY_CARE_PROVIDER_SITE_OTHER): Payer: 59 | Admitting: Family Medicine

## 2014-07-23 VITALS — BP 100/71 | HR 104 | Temp 98.8°F | Ht 66.3 in | Wt 193.8 lb

## 2014-07-23 DIAGNOSIS — M25552 Pain in left hip: Secondary | ICD-10-CM | POA: Insufficient documentation

## 2014-07-23 MED ORDER — DICLOFENAC SODIUM 75 MG PO TBEC: 75.0000 mg | DELAYED_RELEASE_TABLET | Freq: Two times a day (BID) | ORAL | Status: DC

## 2014-07-23 NOTE — Progress Notes (Signed)
Pre visit review using our clinic review tool, if applicable. No additional management support is needed unless otherwise documented below in the visit note. 

## 2014-07-23 NOTE — Patient Instructions (Addendum)
Start diclofenac 75 mg twice daily. Start home PT exercises.  Follow up in 2 week if not feeling better.

## 2014-07-23 NOTE — Assessment & Plan Note (Signed)
No sign of lumbar radiculopathy or trochanteric bursitis.  Pain focal over area of previous surgery. Treat with home PT, NSAIDs. If not better in 2 week, follow up for further eval with X-ray etc.

## 2014-07-23 NOTE — Progress Notes (Signed)
   Subjective:    Patient ID: Calvin Smith, male    DOB: 12-Oct-1960, 53 y.o.   MRN: [id number]  HPI   53 year old pt of Dr. Silvio Pate presents with worsening  pain in leftt hip in last week. Hx of reconstructive surgery  For left radius fracture, used left hip bone ( iliac crest) to reconstruct, 6 years ago.  Since  Then he has had some mild pain with bad weather or strenuous activity.  Pain has increased  In intensity and freq. He has been doing more work around the house.   Pain is in left buttock,  No radiation to leg. Does have pain in low back.   No numbness, no weakness. Less strength due to pain at times. Worse after stitting a while, stiff getting up. Better with movement although lately pain constant.   Pain 6/10 , helped minimally.on pain scale.  Using ibuprofen 600 mg 2 times a day.  No recent  Falls.    Review of Systems  Constitutional: Negative for fever and fatigue.  HENT: Negative for ear pain.   Eyes: Negative for pain.  Respiratory: Negative for shortness of breath.   Cardiovascular: Negative for leg swelling.  Gastrointestinal: Negative for abdominal pain.       Objective:   Physical Exam  Constitutional: Vital signs are normal. He appears well-developed and well-nourished.  HENT:  Head: Normocephalic.  Right Ear: Hearing normal.  Left Ear: Hearing normal.  Nose: Nose normal.  Mouth/Throat: Oropharynx is clear and moist and mucous membranes are normal.  Neck: Trachea normal. Carotid bruit is not present. No thyroid mass and no thyromegaly present.  Cardiovascular: Normal rate, regular rhythm and normal pulses.  Exam reveals no gallop, no distant heart sounds and no friction rub.   No murmur heard. No peripheral edema  Pulmonary/Chest: Effort normal and breath sounds normal. No respiratory distress.  Musculoskeletal:       Left hip: He exhibits tenderness and bony tenderness. He exhibits normal range of motion and normal strength.       Lumbar back:  Normal. He exhibits normal range of motion, no tenderness and no bony tenderness.  No anterior pain in left hip, no low back apin, no sciatic nothc pain.  Pain is focal over upper lateral hip ( not at bursa) Neg faber's, neg SLR    Skin: Skin is warm, dry and intact. No rash noted.  Psychiatric: He has a normal mood and affect. His speech is normal and behavior is normal. Thought content normal.          Assessment & Plan:

## 2014-12-07 LAB — HM DIABETES EYE EXAM

## 2015-02-01 ENCOUNTER — Other Ambulatory Visit: Payer: Self-pay

## 2015-02-23 ENCOUNTER — Telehealth: Payer: Self-pay | Admitting: Internal Medicine

## 2015-02-23 NOTE — Telephone Encounter (Signed)
Pt sent my chart message see below  cpx schedule for 09/03/15  Is there any way I can get an appointment this year? It is one of my annual requirements for my health insurance.    Thanks,   Everlene Farrier

## 2015-02-24 NOTE — Telephone Encounter (Signed)
Just find someplace for him that works--- should not be a problem with this much notice. Just needs 30 minutes

## 2015-02-24 NOTE — Telephone Encounter (Signed)
Appointment 11/10 sent my chart mesage

## 2015-04-05 ENCOUNTER — Other Ambulatory Visit: Payer: Self-pay | Admitting: Internal Medicine

## 2015-04-07 ENCOUNTER — Other Ambulatory Visit: Payer: Self-pay | Admitting: Internal Medicine

## 2015-05-11 ENCOUNTER — Other Ambulatory Visit: Payer: Self-pay | Admitting: Internal Medicine

## 2015-05-18 ENCOUNTER — Other Ambulatory Visit: Payer: Self-pay | Admitting: Internal Medicine

## 2015-05-21 ENCOUNTER — Other Ambulatory Visit: Payer: Self-pay | Admitting: Internal Medicine

## 2015-06-17 ENCOUNTER — Encounter: Payer: Self-pay | Admitting: Internal Medicine

## 2015-06-17 ENCOUNTER — Ambulatory Visit (INDEPENDENT_AMBULATORY_CARE_PROVIDER_SITE_OTHER): Payer: 59 | Admitting: Internal Medicine

## 2015-06-17 VITALS — BP 110/60 | HR 100 | Temp 98.3°F | Ht 66.0 in | Wt 186.0 lb

## 2015-06-17 DIAGNOSIS — Z0000 Encounter for general adult medical examination without abnormal findings: Secondary | ICD-10-CM

## 2015-06-17 DIAGNOSIS — Z1211 Encounter for screening for malignant neoplasm of colon: Secondary | ICD-10-CM

## 2015-06-17 DIAGNOSIS — E119 Type 2 diabetes mellitus without complications: Secondary | ICD-10-CM

## 2015-06-17 DIAGNOSIS — G4733 Obstructive sleep apnea (adult) (pediatric): Secondary | ICD-10-CM | POA: Diagnosis not present

## 2015-06-17 DIAGNOSIS — I10 Essential (primary) hypertension: Secondary | ICD-10-CM | POA: Diagnosis not present

## 2015-06-17 DIAGNOSIS — Z794 Long term (current) use of insulin: Secondary | ICD-10-CM

## 2015-06-17 LAB — HM DIABETES FOOT EXAM

## 2015-06-17 MED ORDER — GLUCOSE BLOOD VI STRP: ORAL_STRIP | Status: DC

## 2015-06-17 NOTE — Assessment & Plan Note (Signed)
Needs new machine (or repeat testing if that is required)

## 2015-06-17 NOTE — Progress Notes (Signed)
Pre visit review using our clinic review tool, if applicable. No additional management support is needed unless otherwise documented below in the visit note. 

## 2015-06-17 NOTE — Patient Instructions (Signed)
Please check with your CPAP company--if they can replace it with just an order from me, I will do it. If you need a reevaluation, I will set this up.

## 2015-06-17 NOTE — Assessment & Plan Note (Signed)
Given high fastings, will increase lantus if A1c over 8%

## 2015-06-17 NOTE — Assessment & Plan Note (Signed)
Healthy Discussed lifestyle and watching weight Defer PSA till at least next year Fecal immunoassay

## 2015-06-17 NOTE — Progress Notes (Signed)
Subjective:    Patient ID: Calvin Smith, male    DOB: 1961-07-16, 54 y.o.   MRN: [id number]  HPI Here for physical  Did not get in with Dr Cruzita Lederer Doing well with his diabetic control Checking in AMs--- 145-150 usually, some lower. On lantus 60 No apparent hypoglycemic reactions Eye exam in May--- no retinopathy No sores, pain or numbness in feet  Has been very active in summer and fall Now back to the gym Weighs regularly  Feels well otherwise No other issues  Current Outpatient Prescriptions on File Prior to Visit  Medication Sig Dispense Refill  . Alcohol Swabs (B-D SINGLE USE SWABS REGULAR) PADS Use as instructed to test  blood sugar two times a day 200 each 0  . aspirin EC 81 MG tablet Take 81 mg by mouth daily.    Marland Kitchen atorvastatin (LIPITOR) 20 MG tablet Take 1 tablet by mouth  daily 90 tablet 3  . BD PEN NEEDLE NANO U/F 32G X 4 MM MISC Inject once a day under  skin 90 each 2  . diclofenac (VOLTAREN) 75 MG EC tablet Take 1 tablet (75 mg total) by mouth 2 (two) times daily. 30 tablet 0  . glipiZIDE (GLIPIZIDE XL) 5 MG 24 hr tablet Take 1 tablet (5 mg total) by mouth daily. 90 tablet 3  . glucose blood (ONE TOUCH TEST STRIPS) test strip Use as instructed to test blood sugar two times a day dx 250.00 200 each 3  . Insulin Pen Needle (PEN NEEDLES 3/16") 31G X 5 MM MISC 1 each by Does not apply route daily. 100 each 2  . INVOKANA 300 MG TABS tablet Take 1 tablet by mouth  daily before breakfast 90 tablet 0  . JANUVIA 100 MG tablet Take 1 tablet by mouth  daily 90 tablet 3  . Lancets (ONETOUCH ULTRASOFT) lancets Use as instructed to test blood sugar two times a day dx 250.00     . LANTUS SOLOSTAR 100 UNIT/ML Solostar Pen Inject 58 units  subcutaneously at bedtime 60 mL 0  . lisinopril (PRINIVIL,ZESTRIL) 20 MG tablet Take 1 tablet by mouth  daily 90 tablet 3  . metFORMIN (GLUCOPHAGE) 1000 MG tablet Take 1 tablet by mouth  twice a day with meals 180 tablet 3  . rizatriptan  (MAXALT) 10 MG tablet Take 1 tablet by mouth  daily as needed for  migraine. May repeat in 2  hours if needed 18 tablet 0  . sildenafil (REVATIO) 20 MG tablet Take 3-5 tablets (60-100 mg total) by mouth daily as needed. 50 tablet 11  . testosterone (ANDROGEL) 50 MG/5GM GEL Place 5 g onto the skin daily. 3 Tube 3   No current facility-administered medications on file prior to visit.    Allergies  Allergen Reactions  . Bisoprolol-Hydrochlorothiazide     REACTION: E.D.  . Codeine     REACTION: itch    Past Medical History  Diagnosis Date  . Allergic rhinitis   . Hypertension   . Hyperlipidemia   . Migraines   . Sleep apnea   . Erectile dysfunction   . Diabetes mellitus     type 2  . Arm fracture, left as a child  . DeQuervain's disease (tenosynovitis)     right wrist    Past Surgical History  Procedure Laterality Date  . Wrist surgery  1/04    DeQuervain release  . Carpal tunnel release  9/03    left   . Cardiolite  5/06  Negative EF 66%  . Wrist surgery  1/08    ORIF l wrist DVR plate screwhead autologous    Family History  Problem Relation Age of Onset  . Hypertension Father   . Hypertension Paternal Grandfather   . Lymphoma Father     non-hodgkins  . Lung cancer Paternal Aunt   . Coronary artery disease Paternal Uncle   . Colon cancer      in 1 relative (?aunt)  . Uterine cancer Maternal Grandmother     Social History   Social History  . Marital Status: Married    Spouse Name: N/A  . Number of Children: N/A  . 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 Smoking status: Never Smoker   . Smokeless tobacco: Never Used  . Alcohol Use: Yes     Comment: rare  . Drug Use: No  . Sexual Activity:    Partners: Female   Other Topics Concern  . Not on file   Social History Narrative   Regular exercise: light to moderate   Caffeine use: coffee daily   Review of Systems   Constitutional: Negative for fatigue and unexpected weight change.       Wears seat belt  HENT: Negative for dental problem, hearing loss and tinnitus.        Keeps up with the dentist  Eyes: Negative for visual disturbance.       No diplopia or unilateral vision loss  Respiratory: Negative for cough, chest tightness and shortness of breath.   Cardiovascular: Negative for chest pain, palpitations and leg swelling.  Gastrointestinal: Negative for nausea, abdominal pain, constipation and blood in stool.  Endocrine: Negative for polydipsia and polyuria.  Genitourinary: Negative for urgency and difficulty urinating.       Nausea with ED meds--but they work  Skin: Negative for rash.       Sees derm regularly  Allergic/Immunologic: Positive for environmental allergies. Negative for immunocompromised state.  Neurological: Negative for dizziness, syncope, weakness and light-headedness.       Occasional headaches but no bad migraines in a long time  Hematological: Negative for adenopathy. Does not bruise/bleed easily.  Psychiatric/Behavioral: Negative for dysphoric mood. The patient is not nervous/anxious.        Chronic sleep problems CPAP machine broken--wife still thinks he is symptomatic (snoring better but still some apnea)       Objective:   Physical Exam  Constitutional: He appears well-developed and well-nourished. No distress.  HENT:  Head: Normocephalic and atraumatic.  Right Ear: External ear normal.  Left Ear: External ear normal.  Mouth/Throat: Oropharynx is clear and moist.  Eyes: Conjunctivae are normal. Pupils are equal, round, and reactive to light.  Neck: Normal range of motion. Neck supple. No thyromegaly present.  Cardiovascular: Normal rate, regular rhythm, normal heart sounds and intact distal pulses.  Exam reveals no gallop.   No murmur heard. Pulmonary/Chest: Effort normal and breath sounds normal. No respiratory distress. He has no wheezes. He has no rales.    Abdominal: Soft. There is no tenderness.  Musculoskeletal: He exhibits no edema or tenderness.  Lymphadenopathy:    He has no cervical adenopathy.  Neurological:  Normal sensation in feet  Skin: No rash noted. No erythema.  No foot lesions  Psychiatric: He has a normal mood and affect. His behavior is normal.          Assessment &  Plan:

## 2015-06-17 NOTE — Assessment & Plan Note (Signed)
BP Readings from Last 3 Encounters:  06/17/15 110/60  07/23/14 100/71  05/11/14 118/68   Good control

## 2015-06-18 ENCOUNTER — Telehealth: Payer: Self-pay | Admitting: Internal Medicine

## 2015-06-18 LAB — COMPREHENSIVE METABOLIC PANEL
A/G RATIO: 1.5 (ref 1.1–2.5)
ALK PHOS: 56 IU/L (ref 39–117)
ALT: 32 IU/L (ref 0–44)
AST: 28 IU/L (ref 0–40)
Albumin: 4.5 g/dL (ref 3.5–5.5)
BUN/Creatinine Ratio: 11 (ref 9–20)
BUN: 14 mg/dL (ref 6–24)
Bilirubin Total: 1.1 mg/dL (ref 0.0–1.2)
CALCIUM: 9.1 mg/dL (ref 8.7–10.2)
CO2: 22 mmol/L (ref 18–29)
CREATININE: 1.24 mg/dL (ref 0.76–1.27)
Chloride: 91 mmol/L — ABNORMAL LOW (ref 97–106)
GFR calc Af Amer: 76 mL/min/{1.73_m2} (ref >59)
GFR calc non Af Amer: 65 mL/min/{1.73_m2} (ref >59)
GLOBULIN, TOTAL: 3 g/dL (ref 1.5–4.5)
Glucose: 100 mg/dL — ABNORMAL HIGH (ref 65–99)
POTASSIUM: 4.1 mmol/L (ref 3.5–5.2)
SODIUM: 132 mmol/L — AB (ref 136–144)
Total Protein: 7.5 g/dL (ref 6.0–8.5)

## 2015-06-18 LAB — CBC WITH DIFFERENTIAL/PLATELET
BASOS ABS: 0 10*3/uL (ref 0.0–0.2)
Basos: 0 %
EOS (ABSOLUTE): 0.1 10*3/uL (ref 0.0–0.4)
Eos: 1 %
Hematocrit: 46.5 % (ref 37.5–51.0)
Hemoglobin: 16.4 g/dL (ref 12.6–17.7)
IMMATURE GRANS (ABS): 0 10*3/uL (ref 0.0–0.1)
IMMATURE GRANULOCYTES: 0 %
LYMPHS: 26 %
Lymphocytes Absolute: 3 10*3/uL (ref 0.7–3.1)
MCH: 32.5 pg (ref 26.6–33.0)
MCHC: 35.3 g/dL (ref 31.5–35.7)
MCV: 92 fL (ref 79–97)
MONOS ABS: 0.8 10*3/uL (ref 0.1–0.9)
Monocytes: 7 %
NEUTROS PCT: 66 %
Neutrophils Absolute: 7.8 10*3/uL — ABNORMAL HIGH (ref 1.4–7.0)
PLATELETS: 238 10*3/uL (ref 150–379)
RBC: 5.05 x10E6/uL (ref 4.14–5.80)
RDW: 13 % (ref 12.3–15.4)
WBC: 11.7 10*3/uL — AB (ref 3.4–10.8)

## 2015-06-18 LAB — LIPID PANEL
CHOLESTEROL TOTAL: 154 mg/dL (ref 100–199)
Chol/HDL Ratio: 3.4 ratio units (ref 0.0–5.0)
HDL: 45 mg/dL (ref >39)
LDL Calculated: 88 mg/dL (ref 0–99)
TRIGLYCERIDES: 107 mg/dL (ref 0–149)
VLDL Cholesterol Cal: 21 mg/dL (ref 5–40)

## 2015-06-18 LAB — HEMOGLOBIN A1C
Est. average glucose Bld gHb Est-mCnc: 183 mg/dL
HEMOGLOBIN A1C: 8 % — AB (ref 4.8–5.6)

## 2015-06-18 NOTE — Telephone Encounter (Signed)
Pt returned call. Please call back at [phone] Thank you

## 2015-06-21 ENCOUNTER — Telehealth: Payer: Self-pay | Admitting: Internal Medicine

## 2015-06-21 ENCOUNTER — Encounter: Payer: Self-pay | Admitting: *Deleted

## 2015-06-21 NOTE — Telephone Encounter (Signed)
Patient returned Dee's call. °

## 2015-06-21 NOTE — Telephone Encounter (Signed)
See result note.  

## 2015-08-09 ENCOUNTER — Other Ambulatory Visit: Payer: Self-pay | Admitting: Internal Medicine

## 2015-08-16 ENCOUNTER — Other Ambulatory Visit: Payer: Self-pay | Admitting: Internal Medicine

## 2015-09-03 ENCOUNTER — Encounter: Payer: Self-pay | Admitting: Internal Medicine

## 2015-09-07 ENCOUNTER — Other Ambulatory Visit: Payer: Self-pay | Admitting: Internal Medicine

## 2015-09-09 ENCOUNTER — Encounter: Payer: Self-pay | Admitting: Internal Medicine

## 2015-09-10 MED ORDER — TESTOSTERONE 50 MG/5GM (1%) TD GEL: 5.0000 g | Freq: Every day | TRANSDERMAL | Status: DC

## 2015-09-10 NOTE — Telephone Encounter (Signed)
Okay to refill it for a year--- I did prescribe it in the past Let him know

## 2015-09-10 NOTE — Telephone Encounter (Signed)
rx faxed to pharmacy manually  

## 2015-09-10 NOTE — Addendum Note (Signed)
Addended by: Despina Hidden on: 09/10/2015 02:11 PM   Modules accepted: Orders

## 2015-09-17 NOTE — Telephone Encounter (Signed)
Calvin Smith with [facility] rx left v/m; received androgel rx and 3 tubes is only 3 day supply; do you want pt to have # 90 tubes or what quantity do you want pt to have. Request cb [id number];' ref # [id number].

## 2015-09-30 ENCOUNTER — Other Ambulatory Visit: Payer: Self-pay | Admitting: Internal Medicine

## 2015-12-11 ENCOUNTER — Other Ambulatory Visit: Payer: Self-pay | Admitting: Internal Medicine

## 2015-12-17 ENCOUNTER — Encounter: Payer: Self-pay | Admitting: Internal Medicine

## 2015-12-17 ENCOUNTER — Ambulatory Visit (INDEPENDENT_AMBULATORY_CARE_PROVIDER_SITE_OTHER): Payer: 59 | Admitting: Internal Medicine

## 2015-12-17 VITALS — BP 98/60 | HR 94 | Temp 98.0°F | Wt 186.0 lb

## 2015-12-17 DIAGNOSIS — E291 Testicular hypofunction: Secondary | ICD-10-CM | POA: Diagnosis not present

## 2015-12-17 DIAGNOSIS — E119 Type 2 diabetes mellitus without complications: Secondary | ICD-10-CM | POA: Diagnosis not present

## 2015-12-17 DIAGNOSIS — I10 Essential (primary) hypertension: Secondary | ICD-10-CM

## 2015-12-17 DIAGNOSIS — E785 Hyperlipidemia, unspecified: Secondary | ICD-10-CM

## 2015-12-17 DIAGNOSIS — Z794 Long term (current) use of insulin: Secondary | ICD-10-CM | POA: Diagnosis not present

## 2015-12-17 LAB — HEMOGLOBIN A1C: HEMOGLOBIN A1C: 7.9 % — AB (ref 4.6–6.5)

## 2015-12-17 MED ORDER — TESTOSTERONE 50 MG/5GM (1%) TD GEL: 5.0000 g | Freq: Every day | TRANSDERMAL | Status: DC

## 2015-12-17 MED ORDER — SILDENAFIL CITRATE 20 MG PO TABS: 60.0000 mg | ORAL_TABLET | Freq: Every day | ORAL | Status: DC | PRN

## 2015-12-17 NOTE — Assessment & Plan Note (Signed)
Discussed sildenafil only if enough Testosterone if needed

## 2015-12-17 NOTE — Assessment & Plan Note (Signed)
No problems with meds

## 2015-12-17 NOTE — Progress Notes (Signed)
Subjective:    Patient ID: Calvin Smith, male    DOB: 1960-11-10, 55 y.o.   MRN: [id number]  HPI Here for follow up on diabetes and other medical conditions  Had some trouble with sugar levels some months ago Better in the past month Doesn't seem to have changed eating or exercise fastings were above 150--- now under 125 and as low as 81. Usually 100-110 No hypoglycemic reactions Will be setting up eye exam  No chest pain No SOB No dizziness or syncope  Having trouble getting the androderm--trouble with the mail order Still using sildenafil  No trouble with statin No myalgias or GI problems  Current Outpatient Prescriptions on File Prior to Visit  Medication Sig Dispense Refill  . Alcohol Swabs (B-D SINGLE USE SWABS REGULAR) PADS Use as instructed to test  blood sugar two times a day 200 each 0  . aspirin EC 81 MG tablet Take 81 mg by mouth daily.    Marland Kitchen atorvastatin (LIPITOR) 20 MG tablet Take 1 tablet by mouth  daily 90 tablet 3  . BD PEN NEEDLE NANO U/F 32G X 4 MM MISC Inject once a day under  skin 90 each 2  . diclofenac (VOLTAREN) 75 MG EC tablet Take 1 tablet (75 mg total) by mouth 2 (two) times daily. 30 tablet 0  . glipiZIDE (GLIPIZIDE XL) 5 MG 24 hr tablet Take 1 tablet (5 mg total) by mouth daily. 90 tablet 3  . glucose blood (ONE TOUCH TEST STRIPS) test strip Use as instructed to test blood sugar two times a day dx 250.00 200 each 3  . INVOKANA 300 MG TABS tablet Take 1 tablet by mouth  daily before breakfast 90 tablet 3  . JANUVIA 100 MG tablet Take 1 tablet by mouth  daily 90 tablet 3  . LANTUS SOLOSTAR 100 UNIT/ML Solostar Pen Inject 58 units  subcutaneously at bedtime (Patient taking differently: Inject 70 units  subcutaneously at bedtime) 60 mL 2  . lisinopril (PRINIVIL,ZESTRIL) 20 MG tablet Take 1 tablet by mouth  daily 90 tablet 3  . metFORMIN (GLUCOPHAGE) 1000 MG tablet Take 1 tablet by mouth  twice a day with meals 180 tablet 3  . rizatriptan (MAXALT) 10  MG tablet Take 1 tablet by mouth  daily as needed for  migraine. May repeat in 2  hours if needed 18 tablet 0  . sildenafil (REVATIO) 20 MG tablet Take 3-5 tablets (60-100 mg total) by mouth daily as needed. 50 tablet 11  . testosterone (ANDROGEL) 50 MG/5GM (1%) GEL Place 5 g onto the skin daily. 3 Tube 3   No current facility-administered medications on file prior to visit.    Allergies  Allergen Reactions  . Bisoprolol-Hydrochlorothiazide     REACTION: E.D.  . Codeine     REACTION: itch    Past Medical History  Diagnosis Date  . Allergic rhinitis   . Hypertension   . Hyperlipidemia   . Migraines   . Sleep apnea   . Erectile dysfunction   . Diabetes mellitus     type 2  . Arm fracture, left as a child  . DeQuervain's disease (tenosynovitis)     right wrist    Past Surgical History  Procedure Laterality Date  . Wrist surgery  1/04    DeQuervain release  . Carpal tunnel release  9/03    left   . Cardiolite  5/06    Negative EF 66%  . Wrist surgery  1/08  ORIF l wrist DVR plate screwhead autologous    Family History  Problem Relation Age of Onset  . Hypertension Father   . Hypertension Paternal Grandfather   . Lymphoma Father     non-hodgkins  . Lung cancer Paternal Aunt   . Coronary artery disease Paternal Uncle   . Colon cancer      in 1 relative (?aunt)  . Uterine cancer Maternal Grandmother     Social History   Social History  . Marital Status: Married    Spouse Name: N/A  . Number of Children: N/A  . 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 Smoking status: Never Smoker   . Smokeless tobacco: Never Used  . Alcohol Use: Yes     Comment: rare  . Drug Use: No  . Sexual Activity:    Partners: Female   Other Topics Concern  . Not on file   Social History Narrative   Regular exercise: light to moderate   Caffeine use: coffee daily     Review of  Systems Weight fairly stable Walks when he can-- has started back in gym in past month 12-15K steps per day also Mild allergy symptoms--no meds    Objective:   Physical Exam  Constitutional: He appears well-developed and well-nourished. No distress.  Neck: Normal range of motion. Neck supple. No thyromegaly present.  Cardiovascular: Normal rate, regular rhythm, normal heart sounds and intact distal pulses.  Exam reveals no gallop.   No murmur heard. Pulmonary/Chest: Effort normal and breath sounds normal. No respiratory distress. He has no wheezes. He has no rales.  Abdominal: Soft. There is no tenderness.  Musculoskeletal: He exhibits no edema or tenderness.  Lymphadenopathy:    He has no cervical adenopathy.  Neurological:  Normal sensation in feet  Skin:  No foot lesions  Psychiatric: He has a normal mood and affect. His behavior is normal.          Assessment & Plan:

## 2015-12-17 NOTE — Assessment & Plan Note (Signed)
BP Readings from Last 3 Encounters:  12/17/15 98/60  06/17/15 110/60  07/23/14 100/71   Stays low but no symptoms so no change in meds

## 2015-12-17 NOTE — Progress Notes (Signed)
Pre visit review using our clinic review tool, if applicable. No additional management support is needed unless otherwise documented below in the visit note. 

## 2015-12-17 NOTE — Assessment & Plan Note (Signed)
May be higher from before--- better now If A1c up a little--will just recheck in 3 months before med changes On more insulin now

## 2016-01-26 LAB — HM DIABETES EYE EXAM

## 2016-02-03 ENCOUNTER — Encounter: Payer: Self-pay | Admitting: Internal Medicine

## 2016-05-22 ENCOUNTER — Other Ambulatory Visit: Payer: Self-pay | Admitting: Internal Medicine

## 2016-06-01 ENCOUNTER — Other Ambulatory Visit: Payer: Self-pay | Admitting: Internal Medicine

## 2016-07-12 ENCOUNTER — Encounter: Payer: Self-pay | Admitting: Internal Medicine

## 2016-07-12 ENCOUNTER — Ambulatory Visit (INDEPENDENT_AMBULATORY_CARE_PROVIDER_SITE_OTHER): Payer: 59 | Admitting: Internal Medicine

## 2016-07-12 VITALS — BP 98/70 | HR 96 | Temp 98.4°F | Ht 66.5 in | Wt 184.0 lb

## 2016-07-12 DIAGNOSIS — Z1211 Encounter for screening for malignant neoplasm of colon: Secondary | ICD-10-CM

## 2016-07-12 DIAGNOSIS — E119 Type 2 diabetes mellitus without complications: Secondary | ICD-10-CM | POA: Diagnosis not present

## 2016-07-12 DIAGNOSIS — Z794 Long term (current) use of insulin: Secondary | ICD-10-CM

## 2016-07-12 DIAGNOSIS — R200 Anesthesia of skin: Secondary | ICD-10-CM | POA: Insufficient documentation

## 2016-07-12 DIAGNOSIS — Z23 Encounter for immunization: Secondary | ICD-10-CM | POA: Diagnosis not present

## 2016-07-12 DIAGNOSIS — Z0000 Encounter for general adult medical examination without abnormal findings: Secondary | ICD-10-CM

## 2016-07-12 DIAGNOSIS — G4733 Obstructive sleep apnea (adult) (pediatric): Secondary | ICD-10-CM

## 2016-07-12 DIAGNOSIS — I10 Essential (primary) hypertension: Secondary | ICD-10-CM | POA: Diagnosis not present

## 2016-07-12 LAB — HM DIABETES FOOT EXAM

## 2016-07-12 MED ORDER — ONETOUCH DELICA LANCETS 33G MISC: 1.0000 [IU] | Freq: Two times a day (BID) | 4 refills | Status: DC

## 2016-07-12 MED ORDER — ATORVASTATIN CALCIUM 20 MG PO TABS: 20.0000 mg | ORAL_TABLET | Freq: Every day | ORAL | 3 refills | Status: DC

## 2016-07-12 MED ORDER — RIZATRIPTAN BENZOATE 10 MG PO TABS: ORAL_TABLET | ORAL | 3 refills | Status: DC

## 2016-07-12 MED ORDER — LISINOPRIL 20 MG PO TABS: 20.0000 mg | ORAL_TABLET | Freq: Every day | ORAL | 3 refills | Status: DC

## 2016-07-12 MED ORDER — INSULIN PEN NEEDLE 32G X 4 MM MISC: 3 refills | Status: DC

## 2016-07-12 NOTE — Addendum Note (Signed)
Addended by: Ellamae Sia on: 07/12/2016 10:35 AM   Modules accepted: Orders

## 2016-07-12 NOTE — Patient Instructions (Signed)
Please increase your lantus by 2 units every 3-4 days till your fasting sugars are consistently under 130 or so.

## 2016-07-12 NOTE — Assessment & Plan Note (Signed)
BP Readings from Last 3 Encounters:  07/12/16 98/70  12/17/15 98/60  06/17/15 110/60   Good control No dizziness

## 2016-07-12 NOTE — Addendum Note (Signed)
Addended by: Ellamae Sia on: 07/12/2016 10:34 AM   Modules accepted: Orders

## 2016-07-12 NOTE — Assessment & Plan Note (Signed)
Diabetic? Will check labs

## 2016-07-12 NOTE — Assessment & Plan Note (Signed)
Rx for mask and supplies May need new machine

## 2016-07-12 NOTE — Assessment & Plan Note (Signed)
?  early neuropathy Will titrate lantus Consider trulicity or comparable instead of [place]

## 2016-07-12 NOTE — Addendum Note (Signed)
Addended by: Pilar Grammes on: 07/12/2016 11:02 AM   Modules accepted: Orders

## 2016-07-12 NOTE — Progress Notes (Signed)
Pre visit review using our clinic review tool, if applicable. No additional management support is needed unless otherwise documented below in the visit note. 

## 2016-07-12 NOTE — Assessment & Plan Note (Signed)
Will get back to exercise PSA today FIT prevnar

## 2016-07-12 NOTE — Progress Notes (Signed)
Subjective:    Patient ID: Calvin Smith, male    DOB: Jan 30, 1961, 55 y.o.   MRN: [id number]  HPI Here for physical and follow up of chronic health conditions  Rough couple of weeks--headache daily last week. ?fighting something Some better now  Some numbness on right plantar foot No pain  Checks sugars irregularly Very variable-- 120-195. Mostly around 150 UTD with eye exam No sores in feet  Burning in right knee on outside Started several months ago No clear cut injury--but did spend some time clearing brush (well before symptoms) Seems better now  Uses CPAP but has not been consistent Seems to be having more symptoms Initial sleep study done at [facility]--many years ago  Current Outpatient Prescriptions on File Prior to Visit  Medication Sig Dispense Refill  . Alcohol Swabs (B-D SINGLE USE SWABS REGULAR) PADS USE AS INSTRUCTED TO TEST  BLOOD SUGAR TWO TIMES A DAY 200 each 4  . aspirin EC 81 MG tablet Take 81 mg by mouth daily.    Marland Kitchen atorvastatin (LIPITOR) 20 MG tablet TAKE 1 TABLET BY MOUTH  DAILY 90 tablet 0  . BD PEN NEEDLE NANO U/F 32G X 4 MM MISC Inject once a day under  skin 90 each 2  . diclofenac (VOLTAREN) 75 MG EC tablet Take 1 tablet (75 mg total) by mouth 2 (two) times daily. 30 tablet 0  . glipiZIDE (GLIPIZIDE XL) 5 MG 24 hr tablet Take 1 tablet (5 mg total) by mouth daily. 90 tablet 3  . glucose blood (ONE TOUCH TEST STRIPS) test strip Use as instructed to test blood sugar two times a day dx 250.00 200 each 3  . INVOKANA 300 MG TABS tablet Take 1 tablet by mouth  daily before breakfast 90 tablet 3  . JANUVIA 100 MG tablet TAKE 1 TABLET BY MOUTH  DAILY 90 tablet 0  . LANTUS SOLOSTAR 100 UNIT/ML Solostar Pen INJECT 58 UNITS  SUBCUTANEOUSLY AT BEDTIME (Patient taking differently: INJECT 70 UNITS  SUBCUTANEOUSLY AT BEDTIME) 60 mL 3  . lisinopril (PRINIVIL,ZESTRIL) 20 MG tablet TAKE 1 TABLET BY MOUTH  DAILY 90 tablet 0  . metFORMIN (GLUCOPHAGE) 1000 MG tablet TAKE 1  TABLET BY MOUTH  TWICE A DAY WITH MEALS 180 tablet 0  . ONETOUCH DELICA LANCETS 99991111 MISC 1 Units by Does not apply route 2 (two) times daily before a meal.    . rizatriptan (MAXALT) 10 MG tablet TAKE 1 TABLET BY MOUTH  DAILY AS NEEDED FOR  MIGRAINE. MAY REPEAT IN 2  HOURS IF NEEDED 18 tablet 0  . sildenafil (REVATIO) 20 MG tablet Take 3-5 tablets (60-100 mg total) by mouth daily as needed. 50 tablet 11   No current facility-administered medications on file prior to visit.     Allergies  Allergen Reactions  . Bisoprolol-Hydrochlorothiazide     REACTION: E.D.  . Codeine     REACTION: itch    Past Medical History:  Diagnosis Date  . Allergic rhinitis   . Arm fracture, left as a child  . DeQuervain's disease (tenosynovitis)    right wrist  . Diabetes mellitus    type 2  . Erectile dysfunction   . Hyperlipidemia   . Hypertension   . Migraines   . Sleep apnea     Past Surgical History:  Procedure Laterality Date  . Cardiolite  5/06   Negative EF 66%  . CARPAL TUNNEL RELEASE  9/03   left   . WRIST SURGERY  1/04  DeQuervain release  . WRIST SURGERY  1/08   ORIF l wrist DVR plate screwhead autologous    Family History  Problem Relation Age of Onset  . Hypertension Father   . Hypertension Paternal Grandfather   . Lymphoma Father     non-hodgkins  . Lung cancer Paternal Aunt   . Coronary artery disease Paternal Uncle   . Colon cancer      in 1 relative (?aunt)  . Uterine cancer Maternal Grandmother     Social History   Social History  . Marital status: Married    Spouse name: N/A  . Number of children: N/A  . Years of education: N/A   Occupational History  . 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 Smoking status: Never Smoker  . Smokeless tobacco: Never Used  . Alcohol use Yes     Comment: rare  . Drug use: No  . Sexual activity: Yes    Partners: Female   Other Topics Concern  . Not on file   Social History  Narrative   Regular exercise: light to moderate   Caffeine use: coffee daily   Review of Systems  Constitutional: Negative for fatigue and unexpected weight change.       Has cut back on exercise since Thanksgiving--didn't feel right Wears seat belt  HENT: Negative for dental problem, hearing loss and tinnitus.        Regular with dentist  Eyes: Negative for visual disturbance.       No diplopia or unilateral vision loss  Respiratory: Negative for cough, chest tightness and shortness of breath.   Cardiovascular: Negative for chest pain, palpitations and leg swelling.  Gastrointestinal: Negative for abdominal pain, blood in stool, constipation, nausea and vomiting.  Endocrine: Negative for polydipsia and polyuria.  Genitourinary: Negative for difficulty urinating and urgency.       Not on the testosterone Some ED-- sildenafil helps at times  Musculoskeletal: Negative for arthralgias, back pain and joint swelling.  Skin: Negative for rash.       No suspicious lesions--- Dr Kellie Moor  Allergic/Immunologic: Negative for environmental allergies and immunocompromised state.  Neurological: Negative for dizziness, syncope and light-headedness.       Still with chronic headaches  Hematological: Negative for adenopathy. Does not bruise/bleed easily.  Psychiatric/Behavioral: Positive for sleep disturbance. Negative for dysphoric mood. The patient is not nervous/anxious.        Objective:   Physical Exam  Constitutional: He is oriented to person, place, and time. He appears well-developed and well-nourished. No distress.  HENT:  Head: Normocephalic and atraumatic.  Right Ear: External ear normal.  Left Ear: External ear normal.  Mouth/Throat: Oropharynx is clear and moist. No oropharyngeal exudate.  Eyes: Conjunctivae are normal. Pupils are equal, round, and reactive to light.  Neck: Normal range of motion. Neck supple. No thyromegaly present.  Cardiovascular: Normal rate, regular rhythm,  normal heart sounds and intact distal pulses.  Exam reveals no gallop.   No murmur heard. Pulmonary/Chest: Effort normal and breath sounds normal. No respiratory distress. He has no wheezes. He has no rales.  Abdominal: Soft. There is no tenderness.  Musculoskeletal: He exhibits no edema or tenderness.  Lymphadenopathy:    He has no cervical adenopathy.  Neurological: He is alert and oriented to person, place, and time.  Sensation intact to monofilament in both feet  Skin:  Slight plantar callous but no sig foot lesions  Psychiatric: He has a  normal mood and affect. His behavior is normal.          Assessment & Plan:

## 2016-07-13 ENCOUNTER — Other Ambulatory Visit: Payer: Self-pay | Admitting: Internal Medicine

## 2016-07-13 DIAGNOSIS — IMO0001 Reserved for inherently not codable concepts without codable children: Secondary | ICD-10-CM

## 2016-07-13 DIAGNOSIS — E1165 Type 2 diabetes mellitus with hyperglycemia: Principal | ICD-10-CM

## 2016-07-13 DIAGNOSIS — Z794 Long term (current) use of insulin: Principal | ICD-10-CM

## 2016-07-17 LAB — CBC WITH DIFFERENTIAL/PLATELET
BASOS: 0 %
Basophils Absolute: 0 10*3/uL (ref 0.0–0.2)
EOS (ABSOLUTE): 0.1 10*3/uL (ref 0.0–0.4)
EOS: 1 %
HEMATOCRIT: 46.6 % (ref 37.5–51.0)
HEMOGLOBIN: 16.4 g/dL (ref 13.0–17.7)
IMMATURE GRANS (ABS): 0 10*3/uL (ref 0.0–0.1)
IMMATURE GRANULOCYTES: 1 %
LYMPHS: 25 %
Lymphocytes Absolute: 2 10*3/uL (ref 0.7–3.1)
MCH: 31.7 pg (ref 26.6–33.0)
MCHC: 35.2 g/dL (ref 31.5–35.7)
MCV: 90 fL (ref 79–97)
MONOCYTES: 9 %
Monocytes Absolute: 0.7 10*3/uL (ref 0.1–0.9)
NEUTROS PCT: 64 %
Neutrophils Absolute: 5.4 10*3/uL (ref 1.4–7.0)
Platelets: 241 10*3/uL (ref 150–379)
RBC: 5.18 x10E6/uL (ref 4.14–5.80)
RDW: 13.5 % (ref 12.3–15.4)
WBC: 8.2 10*3/uL (ref 3.4–10.8)

## 2016-07-17 LAB — COMPREHENSIVE METABOLIC PANEL
A/G RATIO: 1.8 (ref 1.2–2.2)
ALBUMIN: 4.6 g/dL (ref 3.5–5.5)
ALK PHOS: 70 IU/L (ref 39–117)
ALT: 51 IU/L — ABNORMAL HIGH (ref 0–44)
AST: 35 IU/L (ref 0–40)
BUN / CREAT RATIO: 14 (ref 9–20)
BUN: 16 mg/dL (ref 6–24)
Bilirubin Total: 0.9 mg/dL (ref 0.0–1.2)
CO2: 23 mmol/L (ref 18–29)
CREATININE: 1.15 mg/dL (ref 0.76–1.27)
Calcium: 9.6 mg/dL (ref 8.7–10.2)
Chloride: 95 mmol/L — ABNORMAL LOW (ref 96–106)
GFR calc Af Amer: 82 mL/min/{1.73_m2} (ref >59)
GFR calc non Af Amer: 71 mL/min/{1.73_m2} (ref >59)
GLOBULIN, TOTAL: 2.6 g/dL (ref 1.5–4.5)
Glucose: 144 mg/dL — ABNORMAL HIGH (ref 65–99)
POTASSIUM: 4.7 mmol/L (ref 3.5–5.2)
SODIUM: 135 mmol/L (ref 134–144)
Total Protein: 7.2 g/dL (ref 6.0–8.5)

## 2016-07-17 LAB — HEMOGLOBIN A1C
ESTIMATED AVERAGE GLUCOSE: 217 mg/dL
Hgb A1c MFr Bld: 9.2 % — ABNORMAL HIGH (ref 4.8–5.6)

## 2016-07-17 LAB — PROTEIN ELECTROPHORESIS, SERUM, WITH REFLEX
A/G RATIO SPE: 1.2 (ref 0.7–1.7)
Albumin ELP: 3.9 g/dL (ref 2.9–4.4)
Alpha 1: 0.2 g/dL (ref 0.0–0.4)
Alpha 2: 0.9 g/dL (ref 0.4–1.0)
BETA: 1.3 g/dL (ref 0.7–1.3)
GAMMA GLOBULIN: 0.9 g/dL (ref 0.4–1.8)
GLOBULIN, TOTAL: 3.3 g/dL (ref 2.2–3.9)

## 2016-07-17 LAB — LIPID PANEL
CHOLESTEROL TOTAL: 171 mg/dL (ref 100–199)
Chol/HDL Ratio: 4.2 ratio units (ref 0.0–5.0)
HDL: 41 mg/dL (ref >39)
LDL Calculated: 105 mg/dL — ABNORMAL HIGH (ref 0–99)
TRIGLYCERIDES: 124 mg/dL (ref 0–149)
VLDL CHOLESTEROL CAL: 25 mg/dL (ref 5–40)

## 2016-07-17 LAB — VITAMIN B12: Vitamin B-12: 492 pg/mL (ref 232–1245)

## 2016-07-17 LAB — T4, FREE: Free T4: 1.23 ng/dL (ref 0.82–1.77)

## 2016-07-17 LAB — PSA: PROSTATE SPECIFIC AG, SERUM: 0.5 ng/mL (ref 0.0–4.0)

## 2016-07-27 ENCOUNTER — Encounter: Payer: Self-pay | Admitting: Internal Medicine

## 2016-08-17 DIAGNOSIS — Z1211 Encounter for screening for malignant neoplasm of colon: Secondary | ICD-10-CM | POA: Diagnosis not present

## 2016-08-21 LAB — FECAL OCCULT BLOOD, IMMUNOCHEMICAL: FECAL OCCULT BLD: NEGATIVE

## 2016-10-04 ENCOUNTER — Other Ambulatory Visit: Payer: Self-pay | Admitting: Internal Medicine

## 2016-10-19 ENCOUNTER — Other Ambulatory Visit (INDEPENDENT_AMBULATORY_CARE_PROVIDER_SITE_OTHER): Payer: 59

## 2016-10-19 DIAGNOSIS — E1165 Type 2 diabetes mellitus with hyperglycemia: Secondary | ICD-10-CM | POA: Diagnosis not present

## 2016-10-19 DIAGNOSIS — Z794 Long term (current) use of insulin: Secondary | ICD-10-CM

## 2016-10-19 DIAGNOSIS — IMO0001 Reserved for inherently not codable concepts without codable children: Secondary | ICD-10-CM

## 2016-10-20 ENCOUNTER — Encounter: Payer: Self-pay | Admitting: Internal Medicine

## 2016-10-20 LAB — HEMOGLOBIN A1C
ESTIMATED AVERAGE GLUCOSE: 200 mg/dL
HEMOGLOBIN A1C: 8.6 % — AB (ref 4.8–5.6)

## 2016-11-14 ENCOUNTER — Other Ambulatory Visit: Payer: Self-pay | Admitting: Internal Medicine

## 2016-12-24 ENCOUNTER — Other Ambulatory Visit: Payer: Self-pay | Admitting: Family Medicine

## 2017-01-10 ENCOUNTER — Other Ambulatory Visit: Payer: Self-pay | Admitting: Internal Medicine

## 2017-01-10 ENCOUNTER — Other Ambulatory Visit (INDEPENDENT_AMBULATORY_CARE_PROVIDER_SITE_OTHER): Payer: 59

## 2017-01-10 DIAGNOSIS — E1165 Type 2 diabetes mellitus with hyperglycemia: Principal | ICD-10-CM

## 2017-01-10 DIAGNOSIS — IMO0001 Reserved for inherently not codable concepts without codable children: Secondary | ICD-10-CM

## 2017-01-10 DIAGNOSIS — Z794 Long term (current) use of insulin: Principal | ICD-10-CM

## 2017-01-11 LAB — HEMOGLOBIN A1C
ESTIMATED AVERAGE GLUCOSE: 189 mg/dL
HEMOGLOBIN A1C: 8.2 % — AB (ref 4.8–5.6)

## 2017-01-15 ENCOUNTER — Ambulatory Visit (INDEPENDENT_AMBULATORY_CARE_PROVIDER_SITE_OTHER): Payer: 59 | Admitting: Internal Medicine

## 2017-01-15 ENCOUNTER — Encounter: Payer: Self-pay | Admitting: Internal Medicine

## 2017-01-15 VITALS — BP 106/70 | HR 85 | Temp 98.3°F | Wt 189.0 lb

## 2017-01-15 DIAGNOSIS — IMO0001 Reserved for inherently not codable concepts without codable children: Secondary | ICD-10-CM

## 2017-01-15 DIAGNOSIS — E1165 Type 2 diabetes mellitus with hyperglycemia: Secondary | ICD-10-CM | POA: Diagnosis not present

## 2017-01-15 DIAGNOSIS — Z23 Encounter for immunization: Secondary | ICD-10-CM | POA: Diagnosis not present

## 2017-01-15 DIAGNOSIS — Z794 Long term (current) use of insulin: Secondary | ICD-10-CM | POA: Diagnosis not present

## 2017-01-15 MED ORDER — GLIPIZIDE ER 5 MG PO TB24: 5.0000 mg | ORAL_TABLET | Freq: Two times a day (BID) | ORAL | 3 refills | Status: DC

## 2017-01-15 MED ORDER — INSULIN GLARGINE 100 UNIT/ML SOLOSTAR PEN: 40.0000 [IU] | PEN_INJECTOR | Freq: Two times a day (BID) | SUBCUTANEOUS | 0 refills | Status: DC

## 2017-01-15 NOTE — Addendum Note (Signed)
Addended by: Pilar Grammes on: 01/15/2017 04:06 PM   Modules accepted: Orders

## 2017-01-15 NOTE — Assessment & Plan Note (Signed)
Has improved Down to 8.2% Discussed that we want it down under 8% Will increase glipizide to bid Change lantus also to bid---then increase evening dose prn to get fastings under 120-130

## 2017-01-15 NOTE — Progress Notes (Signed)
Subjective:    Patient ID: Calvin Smith, male    DOB: 1961-05-15, 56 y.o.   MRN: [id number]  HPI Here for follow up of diabetes and other chronic health conditions  He is trying to do better Works out when he can--but highly variable Diet is pretty good--- mostly does a good job Checks sugars most AMs---mostly around 150 Range 120-175 No hypoglycemic reactions  Takes lantus at night Glipizide in AM  No chest pain No SOB   Current Outpatient Prescriptions on File Prior to Visit  Medication Sig Dispense Refill  . Alcohol Swabs (B-D SINGLE USE SWABS REGULAR) PADS USE AS INSTRUCTED TO TEST  BLOOD SUGAR TWO TIMES A DAY 200 each 4  . aspirin EC 81 MG tablet Take 81 mg by mouth daily.    Marland Kitchen atorvastatin (LIPITOR) 20 MG tablet Take 1 tablet (20 mg total) by mouth daily. 90 tablet 3  . diclofenac (VOLTAREN) 75 MG EC tablet Take 1 tablet (75 mg total) by mouth 2 (two) times daily. 30 tablet 0  . glipiZIDE (GLIPIZIDE XL) 5 MG 24 hr tablet Take 1 tablet (5 mg total) by mouth daily. 90 tablet 3  . glucose blood (ONE TOUCH TEST STRIPS) test strip Use as instructed to test blood sugar two times a day dx 250.00 200 each 3  . Insulin Pen Needle (BD PEN NEEDLE NANO U/F) 32G X 4 MM MISC Inject once a day under  skin 90 each 3  . INVOKANA 300 MG TABS tablet TAKE 1 TABLET BY MOUTH  DAILY BEFORE BREAKFAST 90 tablet 0  . JANUVIA 100 MG tablet TAKE 1 TABLET BY MOUTH  DAILY 90 tablet 0  . LANTUS SOLOSTAR 100 UNIT/ML Solostar Pen INJECT 58 UNITS  SUBCUTANEOUSLY AT BEDTIME (Patient taking differently: INJECT 80 UNITS  SUBCUTANEOUSLY AT BEDTIME) 60 mL 3  . lisinopril (PRINIVIL,ZESTRIL) 20 MG tablet Take 1 tablet (20 mg total) by mouth daily. 90 tablet 3  . metFORMIN (GLUCOPHAGE) 1000 MG tablet TAKE 1 TABLET BY MOUTH  TWICE A DAY WITH MEALS 180 tablet 0  . ONETOUCH DELICA LANCETS 15B MISC 1 Units by Does not apply route 2 (two) times daily before a meal. 200 each 4  . rizatriptan (MAXALT) 10 MG tablet  TAKE 1 TABLET BY MOUTH  DAILY AS NEEDED FOR  MIGRAINE. MAY REPEAT IN 2  HOURS IF NEEDED 18 tablet 3  . sildenafil (REVATIO) 20 MG tablet Take 3-5 tablets (60-100 mg total) by mouth daily as needed. 50 tablet 11   No current facility-administered medications on file prior to visit.     Allergies  Allergen Reactions  . Bisoprolol-Hydrochlorothiazide     REACTION: E.D.  . Codeine     REACTION: itch    Past Medical History:  Diagnosis Date  . Allergic rhinitis   . Arm fracture, left as a child  . DeQuervain's disease (tenosynovitis)    right wrist  . Diabetes mellitus    type 2  . Erectile dysfunction   . Hyperlipidemia   . Hypertension   . Migraines   . Sleep apnea     Past Surgical History:  Procedure Laterality Date  . Cardiolite  5/06   Negative EF 66%  . CARPAL TUNNEL RELEASE  9/03   left   . WRIST SURGERY  1/04   DeQuervain release  . WRIST SURGERY  1/08   ORIF l wrist DVR plate screwhead autologous    Family History  Problem Relation Age of Onset  .  Hypertension Father   . Hypertension Paternal Grandfather   . Lymphoma Father        non-hodgkins  . Lung cancer Paternal Aunt   . Coronary artery disease Paternal Uncle   . Colon cancer Unknown        in 1 relative (?aunt)  . Uterine cancer Maternal Grandmother     Social History   Social History  . Marital status: Married    Spouse name: N/A  . Number of children: N/A  . Years of education: N/A   Occupational History  . 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 Smoking status: Never Smoker  . Smokeless tobacco: Never Used  . Alcohol use Yes     Comment: rare  . Drug use: No  . Sexual activity: Yes    Partners: Female   Other Topics Concern  . Not on file   Social History Narrative   Regular exercise: light to moderate   Caffeine use: coffee daily   Review of Systems Weight is up 5#---he thinks it may be related to some resistance training Not sleeping  very well-- no real change    Objective:   Physical Exam  Constitutional: He appears well-nourished. No distress.  Cardiovascular: Normal rate, regular rhythm, normal heart sounds and intact distal pulses.  Exam reveals no gallop.   No murmur heard. Pulmonary/Chest: Effort normal and breath sounds normal. No respiratory distress. He has no wheezes. He has no rales.  Musculoskeletal: He exhibits no edema.  Psychiatric: He has a normal mood and affect. His behavior is normal.          Assessment & Plan:

## 2017-01-15 NOTE — Patient Instructions (Signed)
Please increase the glipizide to 5mg  twice a day. These could be both in the morning, but try splitting the dose. Split the lantus and take 40 units twice a day. If your fasting sugars remain over 120, try increasing the evening dose by 2 units every few days.

## 2017-01-16 MED ORDER — GLIPIZIDE ER 5 MG PO TB24: 5.0000 mg | ORAL_TABLET | Freq: Two times a day (BID) | ORAL | 0 refills | Status: DC

## 2017-01-16 NOTE — Telephone Encounter (Signed)
Pt left v/m requesting cb about getting glipizide sent [facility] for short term script and 3 month script to [facility]. Pt request cb work [phone] and cell [phone].

## 2017-02-03 ENCOUNTER — Other Ambulatory Visit: Payer: Self-pay | Admitting: Internal Medicine

## 2017-03-14 ENCOUNTER — Ambulatory Visit (INDEPENDENT_AMBULATORY_CARE_PROVIDER_SITE_OTHER)
Admission: RE | Admit: 2017-03-14 | Discharge: 2017-03-14 | Disposition: A | Discharge status: Home / Self Care | Payer: 59 | Source: Ambulatory Visit | Attending: Primary Care | Admitting: Primary Care

## 2017-03-14 ENCOUNTER — Ambulatory Visit (INDEPENDENT_AMBULATORY_CARE_PROVIDER_SITE_OTHER): Payer: 59 | Admitting: Primary Care

## 2017-03-14 ENCOUNTER — Encounter: Payer: Self-pay | Admitting: Primary Care

## 2017-03-14 VITALS — BP 112/70 | HR 101 | Temp 98.1°F | Ht 66.5 in | Wt 192.8 lb

## 2017-03-14 DIAGNOSIS — R1012 Left upper quadrant pain: Secondary | ICD-10-CM

## 2017-03-14 DIAGNOSIS — R109 Unspecified abdominal pain: Secondary | ICD-10-CM | POA: Diagnosis not present

## 2017-03-14 NOTE — Progress Notes (Signed)
Subjective:    Patient ID: Calvin Smith, male    DOB: November 23, 1960, 56 y.o.   MRN: [id number]  HPI  Calvin Smith is a 56 year old male who presents today with a chief complaint of abdominal pain. His pain is located to the left lateral upper abdomen that he first noticed yesterday afternoon. His pain progressed during the day yesterday, worse during last night as he couldn't move his trunk. He coughed last night which made his pain 10/10.  His pain dose not radiate. He denies nausea, vomiting, diarrhea, fevers, bloody stools, heavy lifting/exertion.   He has noticed constipation with his last bowel movement being Monday evening. He typically has a bowel movement twice daily, everyday. His wife gave him two laxative pills last night. He had a very small bowel movement this morning which was much smaller than his usual morning bowel movement.   Review of Systems  Constitutional: Negative for fever.  Gastrointestinal: Positive for abdominal pain and constipation. Negative for blood in stool, diarrhea, nausea, rectal pain and vomiting.  Musculoskeletal: Negative for myalgias.       Past Medical History:  Diagnosis Date  . Allergic rhinitis   . Arm fracture, left as a child  . DeQuervain's disease (tenosynovitis)    right wrist  . Diabetes mellitus    type 2  . Erectile dysfunction   . Hyperlipidemia   . Hypertension   . Migraines   . Sleep apnea      Social History   Social History  . Marital status: Married    Spouse name: N/A  . Number of children: N/A  . Years of education: N/A   Occupational History  . 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 Smoking status: Never Smoker  . Smokeless tobacco: Never Used  . Alcohol use Yes     Comment: rare  . Drug use: No  . Sexual activity: Yes    Partners: Female   Other Topics Concern  . Not on file   Social History Narrative   Regular exercise: light to moderate   Caffeine use: coffee daily     Past Surgical History:  Procedure Laterality Date  . Cardiolite  5/06   Negative EF 66%  . CARPAL TUNNEL RELEASE  9/03   left   . WRIST SURGERY  1/04   DeQuervain release  . WRIST SURGERY  1/08   ORIF l wrist DVR plate screwhead autologous    Family History  Problem Relation Age of Onset  . Hypertension Father   . Hypertension Paternal Grandfather   . Lymphoma Father        non-hodgkins  . Lung cancer Paternal Aunt   . Coronary artery disease Paternal Uncle   . Colon cancer Unknown        in 1 relative (?aunt)  . Uterine cancer Maternal Grandmother     Allergies  Allergen Reactions  . Bisoprolol-Hydrochlorothiazide     REACTION: E.D.  . Codeine     REACTION: itch    Current Outpatient Prescriptions on File Prior to Visit  Medication Sig Dispense Refill  . Alcohol Swabs (B-D SINGLE USE SWABS REGULAR) PADS USE AS INSTRUCTED TO TEST  BLOOD SUGAR TWO TIMES A DAY 200 each 4  . aspirin EC 81 MG tablet Take 81 mg by mouth daily.    Marland Kitchen atorvastatin (LIPITOR) 20 MG tablet Take 1 tablet (20 mg total) by mouth daily. 90 tablet 3  .  diclofenac (VOLTAREN) 75 MG EC tablet Take 1 tablet (75 mg total) by mouth 2 (two) times daily. 30 tablet 0  . glipiZIDE (GLIPIZIDE XL) 5 MG 24 hr tablet Take 1 tablet (5 mg total) by mouth 2 (two) times daily. 60 tablet 0  . glucose blood (ONE TOUCH TEST STRIPS) test strip Use as instructed to test blood sugar two times a day dx 250.00 200 each 3  . Insulin Glargine (LANTUS SOLOSTAR) 100 UNIT/ML Solostar Pen Inject 40 Units into the skin 2 (two) times daily. 1 mL 0  . Insulin Pen Needle (BD PEN NEEDLE NANO U/F) 32G X 4 MM MISC Inject once a day under  skin 90 each 3  . INVOKANA 300 MG TABS tablet TAKE 1 TABLET BY MOUTH  DAILY BEFORE BREAKFAST 90 tablet 1  . JANUVIA 100 MG tablet TAKE 1 TABLET BY MOUTH  DAILY 90 tablet 0  . lisinopril (PRINIVIL,ZESTRIL) 20 MG tablet Take 1 tablet (20 mg total) by mouth daily. 90 tablet 3  . metFORMIN (GLUCOPHAGE)  1000 MG tablet TAKE 1 TABLET BY MOUTH  TWICE A DAY WITH MEALS 180 tablet 0  . ONETOUCH DELICA LANCETS 08X MISC 1 Units by Does not apply route 2 (two) times daily before a meal. 200 each 4  . rizatriptan (MAXALT) 10 MG tablet TAKE 1 TABLET BY MOUTH  DAILY AS NEEDED FOR  MIGRAINE. MAY REPEAT IN 2  HOURS IF NEEDED 18 tablet 3  . sildenafil (REVATIO) 20 MG tablet Take 3-5 tablets (60-100 mg total) by mouth daily as needed. 50 tablet 11   No current facility-administered medications on file prior to visit.     BP 112/70   Pulse (!) 101   Temp 98.1 F (36.7 C) (Oral)   Ht 5' 6.5" (1.689 m)   Wt 192 lb 12.8 oz (87.5 kg)   SpO2 98%   BMI 30.65 kg/m    Objective:   Physical Exam  Constitutional: He appears well-nourished.  Appears uncomfortable when moving  Neck: Neck supple.  Cardiovascular: Normal rate.   Pulmonary/Chest: Effort normal.  Abdominal: Soft. Normal appearance. Bowel sounds are increased. There is tenderness in the left upper quadrant. There is no rebound and no guarding.    Skin: Skin is warm and dry.          Assessment & Plan:  Abdominal Pain:  LLQ and Left upper lateral side x 24 hours. Also with constipation. Exam today suspicious for constipation given HPI and examination. Bowel sounds throughout so bowel obstruction is less likely. Abdominal Xray pending today. Also check CBC and CMP. No other alarm signs, does not appear toxic or ill. Will await results.  Sheral Flow, NP

## 2017-03-14 NOTE — Patient Instructions (Signed)
Complete lab work and xray prior to leaving today. I will notify you of your results once received.   It was a pleasure meeting you!

## 2017-03-14 NOTE — Addendum Note (Signed)
Addended by: Ellamae Sia on: 03/14/2017 11:19 AM   Modules accepted: Orders

## 2017-03-15 ENCOUNTER — Other Ambulatory Visit: Payer: Self-pay | Admitting: Internal Medicine

## 2017-03-15 LAB — COMPREHENSIVE METABOLIC PANEL
ALBUMIN: 4.6 g/dL (ref 3.5–5.5)
ALK PHOS: 61 IU/L (ref 39–117)
ALT: 34 IU/L (ref 0–44)
AST: 27 IU/L (ref 0–40)
Albumin/Globulin Ratio: 1.6 (ref 1.2–2.2)
BUN / CREAT RATIO: 13 (ref 9–20)
BUN: 15 mg/dL (ref 6–24)
Bilirubin Total: 0.8 mg/dL (ref 0.0–1.2)
CALCIUM: 9.8 mg/dL (ref 8.7–10.2)
CO2: 22 mmol/L (ref 20–29)
CREATININE: 1.18 mg/dL (ref 0.76–1.27)
Chloride: 98 mmol/L (ref 96–106)
GFR calc Af Amer: 79 mL/min/{1.73_m2} (ref >59)
GFR, EST NON AFRICAN AMERICAN: 69 mL/min/{1.73_m2} (ref >59)
GLUCOSE: 91 mg/dL (ref 65–99)
Globulin, Total: 2.8 g/dL (ref 1.5–4.5)
Potassium: 4.8 mmol/L (ref 3.5–5.2)
Sodium: 134 mmol/L (ref 134–144)
TOTAL PROTEIN: 7.4 g/dL (ref 6.0–8.5)

## 2017-03-15 LAB — CBC WITH DIFFERENTIAL/PLATELET
BASOS ABS: 0 10*3/uL (ref 0.0–0.2)
Basos: 0 %
EOS (ABSOLUTE): 0.1 10*3/uL (ref 0.0–0.4)
EOS: 1 %
HEMOGLOBIN: 16.2 g/dL (ref 13.0–17.7)
Hematocrit: 46.6 % (ref 37.5–51.0)
IMMATURE GRANS (ABS): 0 10*3/uL (ref 0.0–0.1)
IMMATURE GRANULOCYTES: 0 %
LYMPHS: 19 %
Lymphocytes Absolute: 2 10*3/uL (ref 0.7–3.1)
MCH: 32.2 pg (ref 26.6–33.0)
MCHC: 34.8 g/dL (ref 31.5–35.7)
MCV: 93 fL (ref 79–97)
MONOCYTES: 7 %
Monocytes Absolute: 0.8 10*3/uL (ref 0.1–0.9)
Neutrophils Absolute: 7.8 10*3/uL — ABNORMAL HIGH (ref 1.4–7.0)
Neutrophils: 73 %
Platelets: 230 10*3/uL (ref 150–379)
RBC: 5.03 x10E6/uL (ref 4.14–5.80)
RDW: 13.5 % (ref 12.3–15.4)
WBC: 10.6 10*3/uL (ref 3.4–10.8)

## 2017-04-24 ENCOUNTER — Other Ambulatory Visit: Payer: Self-pay | Admitting: Internal Medicine

## 2017-05-21 ENCOUNTER — Encounter: Payer: Self-pay | Admitting: Internal Medicine

## 2017-05-30 DIAGNOSIS — D485 Neoplasm of uncertain behavior of skin: Secondary | ICD-10-CM | POA: Diagnosis not present

## 2017-05-30 DIAGNOSIS — D225 Melanocytic nevi of trunk: Secondary | ICD-10-CM | POA: Diagnosis not present

## 2017-05-30 DIAGNOSIS — L570 Actinic keratosis: Secondary | ICD-10-CM | POA: Diagnosis not present

## 2017-06-01 ENCOUNTER — Encounter: Payer: Self-pay | Admitting: Internal Medicine

## 2017-07-20 ENCOUNTER — Telehealth: Payer: Self-pay

## 2017-07-20 ENCOUNTER — Other Ambulatory Visit: Payer: Self-pay | Admitting: Internal Medicine

## 2017-07-20 NOTE — Telephone Encounter (Signed)
Dr Silvio Pate is already gone for the day and the patient's appointment is Tuesday. It would be Monday before I had an answer for him and to get orders if he approves.  Left a message for pt to call office.

## 2017-07-20 NOTE — Telephone Encounter (Signed)
Copied from [place]. Topic: Appointment Scheduling - Prior Auth Required for Appointment >> Jul 20, 2017 12:57 PM Vernona Rieger wrote: Patient called and stated that when he came in in June that Dr Silvio Pate stated he need to do labs before his CPE on 12/18 so that they could discuss them at his appt. Per [facility], it says Dr Silvio Pate does labs the same day as the CPE, his appt is at 8:30. Please advise, call back is [phone] or [phone]. He said its fine either way if he does before or just after the CPE appt.

## 2017-07-24 ENCOUNTER — Ambulatory Visit (INDEPENDENT_AMBULATORY_CARE_PROVIDER_SITE_OTHER): Payer: 59 | Admitting: Internal Medicine

## 2017-07-24 ENCOUNTER — Encounter: Payer: Self-pay | Admitting: Internal Medicine

## 2017-07-24 VITALS — BP 122/70 | HR 89 | Temp 98.1°F | Ht 66.5 in | Wt 190.2 lb

## 2017-07-24 DIAGNOSIS — G4733 Obstructive sleep apnea (adult) (pediatric): Secondary | ICD-10-CM | POA: Diagnosis not present

## 2017-07-24 DIAGNOSIS — Z1211 Encounter for screening for malignant neoplasm of colon: Secondary | ICD-10-CM

## 2017-07-24 DIAGNOSIS — J019 Acute sinusitis, unspecified: Secondary | ICD-10-CM | POA: Insufficient documentation

## 2017-07-24 DIAGNOSIS — E1165 Type 2 diabetes mellitus with hyperglycemia: Secondary | ICD-10-CM

## 2017-07-24 DIAGNOSIS — I10 Essential (primary) hypertension: Secondary | ICD-10-CM | POA: Diagnosis not present

## 2017-07-24 DIAGNOSIS — Z0000 Encounter for general adult medical examination without abnormal findings: Secondary | ICD-10-CM | POA: Diagnosis not present

## 2017-07-24 DIAGNOSIS — IMO0002 Reserved for concepts with insufficient information to code with codable children: Secondary | ICD-10-CM

## 2017-07-24 DIAGNOSIS — E1142 Type 2 diabetes mellitus with diabetic polyneuropathy: Secondary | ICD-10-CM

## 2017-07-24 MED ORDER — AMOXICILLIN-POT CLAVULANATE 875-125 MG PO TABS
1.0000 | ORAL_TABLET | Freq: Two times a day (BID) | ORAL | 0 refills | Status: AC
Start: 2017-07-24 — End: 2017-08-03

## 2017-07-24 MED ORDER — TADALAFIL 20 MG PO TABS: 10.0000 mg | ORAL_TABLET | ORAL | 11 refills | Status: DC | PRN

## 2017-07-24 MED ORDER — GLUCOSE BLOOD VI STRP: ORAL_STRIP | 3 refills | Status: DC

## 2017-07-24 NOTE — Assessment & Plan Note (Signed)
BP Readings from Last 3 Encounters:  07/24/17 122/70  03/14/17 112/70  01/15/17 106/70   Good control

## 2017-07-24 NOTE — Assessment & Plan Note (Signed)
Sick for about 3 weeks Will give Rx for augmentin to start if he isn't clearing soon

## 2017-07-24 NOTE — Assessment & Plan Note (Signed)
Hopefully A1c will be below 8$% Reluctant to increase insulin further though (weight gain) Very early neuropathy symptoms

## 2017-07-24 NOTE — Assessment & Plan Note (Signed)
Increased symptoms now CPAP in past Will make referral again

## 2017-07-24 NOTE — Assessment & Plan Note (Signed)
Discussed fitness Defer PSA to next year at least FIT Had flu vaccine and both pneumonia vaccines

## 2017-07-24 NOTE — Progress Notes (Signed)
Subjective:    Patient ID: Calvin Smith, male    DOB: [birth year], 56 y.o.   MRN: [id number]  HPI Here for physical and follow up of chronic health conditions  Has respiratory infection since 2-3 weeks Ongoing cough, some sputum Head and chest congestion No fever, chills, shakes No SOB other than with cough Chronic headache---ongoing drainage  Checking sugars every morning Now bid with lantus---- 40/50 Generally some better Did have 1 early hypoglycemic spell Slight left foot numbness at times. No pain  Current Outpatient Medications on File Prior to Visit  Medication Sig Dispense Refill  . Alcohol Swabs (B-D SINGLE USE SWABS REGULAR) PADS USE AS INSTRUCTED TO TEST  BLOOD SUGAR TWO TIMES A DAY 200 each 4  . aspirin EC 81 MG tablet Take 81 mg by mouth daily.    Marland Kitchen atorvastatin (LIPITOR) 20 MG tablet Take 1 tablet (20 mg total) by mouth daily. 90 tablet 3  . diclofenac (VOLTAREN) 75 MG EC tablet Take 1 tablet (75 mg total) by mouth 2 (two) times daily. 30 tablet 0  . glipiZIDE (GLIPIZIDE XL) 5 MG 24 hr tablet Take 1 tablet (5 mg total) by mouth 2 (two) times daily. 60 tablet 0  . Insulin Pen Needle (BD PEN NEEDLE NANO U/F) 32G X 4 MM MISC Inject once a day under  skin 90 each 3  . INVOKANA 300 MG TABS tablet TAKE 1 TABLET BY MOUTH  DAILY BEFORE BREAKFAST 90 tablet 3  . JANUVIA 100 MG tablet TAKE 1 TABLET BY MOUTH  DAILY 90 tablet 3  . LANTUS SOLOSTAR 100 UNIT/ML Solostar Pen INJECT 58 UNITS  SUBCUTANEOUSLY AT BEDTIME 60 mL 2  . lisinopril (PRINIVIL,ZESTRIL) 20 MG tablet Take 1 tablet (20 mg total) by mouth daily. 90 tablet 3  . metFORMIN (GLUCOPHAGE) 1000 MG tablet TAKE 1 TABLET BY MOUTH  TWICE A DAY WITH MEALS 180 tablet 3  . ONETOUCH DELICA LANCETS 96V MISC 1 Units by Does not apply route 2 (two) times daily before a meal. 200 each 4  . rizatriptan (MAXALT) 10 MG tablet TAKE 1 TABLET BY MOUTH  DAILY AS NEEDED FOR  MIGRAINE. MAY REPEAT IN 2  HOURS IF NEEDED 18 tablet 3  .  sildenafil (REVATIO) 20 MG tablet Take 3-5 tablets (60-100 mg total) by mouth daily as needed. 50 tablet 11   No current facility-administered medications on file prior to visit.     Allergies  Allergen Reactions  . Bisoprolol-Hydrochlorothiazide     REACTION: E.D.  . Codeine     REACTION: itch    Past Medical History:  Diagnosis Date  . Allergic rhinitis   . Arm fracture, left as a child  . DeQuervain's disease (tenosynovitis)    right wrist  . Diabetes mellitus    type 2  . Erectile dysfunction   . Hyperlipidemia   . Hypertension   . Migraines   . Sleep apnea     Past Surgical History:  Procedure Laterality Date  . Cardiolite  5/06   Negative EF 66%  . CARPAL TUNNEL RELEASE  9/03   left   . WRIST SURGERY  1/04   DeQuervain release  . WRIST SURGERY  1/08   ORIF l wrist DVR plate screwhead autologous    Family History  Problem Relation Age of Onset  . Hypertension Father   . Hypertension Paternal Grandfather   . Lymphoma Father        non-hodgkins  . Lung cancer Paternal Aunt   .  Coronary artery disease Paternal Uncle   . Colon cancer Unknown        in 1 relative (?aunt)  . Uterine cancer Maternal Grandmother     Social History   Socioeconomic History  . Marital status: Married    Spouse name: Not on file  . Number of children: Not on file  . Years of education: Not on file  . Highest education level: Not on file  Social Needs  . Financial resource strain: Not on file  . Food insecurity - worry: Not on file  . Food insecurity - inability: Not on file  . Transportation needs - medical: Not on file  . Transportation needs - non-medical: Not on file  Occupational History  . 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 Smoking status: Never Smoker  . Smokeless tobacco: Never Used  Substance and Sexual Activity  . Alcohol use: Yes    Comment: rare  . Drug use: No  . Sexual activity: Yes    Partners:  Female  Other Topics Concern  . Not on file  Social History Narrative   Regular exercise: light to moderate   Caffeine use: coffee daily   Review of Systems  Constitutional:       Weight had gone down --but back up with the increased insulin Wears seat belt Trying to stay active  HENT: Negative for dental problem, hearing loss and tinnitus.        Keeps up with dentist  Eyes: Negative for visual disturbance.       No diplopia or unilateral vision loss  Respiratory: Positive for cough. Negative for chest tightness and shortness of breath.   Cardiovascular: Negative for chest pain, palpitations and leg swelling.  Gastrointestinal: Negative for blood in stool and constipation.       No regular indigestion  Endocrine: Negative for polydipsia and polyuria.  Genitourinary: Positive for frequency. Negative for difficulty urinating.       Still with ED--sick with the sildenafil (indigestion)  Musculoskeletal: Negative for arthralgias, back pain and joint swelling.  Skin: Negative for rash.       No suspicious lesions  Allergic/Immunologic: Positive for environmental allergies. Negative for immunocompromised state.       Mild only  Neurological: Positive for headaches. Negative for dizziness, syncope and light-headedness.  Hematological: Negative for adenopathy. Does not bruise/bleed easily.  Psychiatric/Behavioral: Positive for sleep disturbance. Negative for dysphoric mood. The patient is not nervous/anxious.        Chronic sleep problems--wife notes apnea       Objective:   Physical Exam  Constitutional: He is oriented to person, place, and time. He appears well-developed. No distress.  HENT:  Head: Normocephalic and atraumatic.  Right Ear: External ear normal.  Left Ear: External ear normal.  Mouth/Throat: Oropharynx is clear and moist. No oropharyngeal exudate.  No sinus tenderness Marked nasal inflammation on left  Eyes: Conjunctivae are normal. Pupils are equal, round, and  reactive to light.  Neck: No thyromegaly present.  Cardiovascular: Normal rate, regular rhythm, normal heart sounds and intact distal pulses. Exam reveals no gallop.  No murmur heard. Pulmonary/Chest: Effort normal and breath sounds normal. No respiratory distress. He has no wheezes. He has no rales.  Abdominal: Soft. He exhibits no distension. There is no tenderness. There is no rebound and no guarding.  Musculoskeletal: He exhibits no edema or tenderness.  Lymphadenopathy:    He has no cervical  adenopathy.  Neurological: He is alert and oriented to person, place, and time.  Normal sensation in feet  Skin: No rash noted. No erythema.  Psychiatric: He has a normal mood and affect. His behavior is normal.          Assessment & Plan:

## 2017-07-25 LAB — LIPID PANEL
CHOLESTEROL TOTAL: 153 mg/dL (ref 100–199)
Chol/HDL Ratio: 3.7 ratio (ref 0.0–5.0)
HDL: 41 mg/dL (ref >39)
LDL CALC: 88 mg/dL (ref 0–99)
TRIGLYCERIDES: 120 mg/dL (ref 0–149)
VLDL CHOLESTEROL CAL: 24 mg/dL (ref 5–40)

## 2017-07-25 LAB — HEMOGLOBIN A1C
Est. average glucose Bld gHb Est-mCnc: 171 mg/dL
Hgb A1c MFr Bld: 7.6 % — ABNORMAL HIGH (ref 4.8–5.6)

## 2017-08-09 ENCOUNTER — Encounter: Payer: Self-pay | Admitting: Internal Medicine

## 2017-08-09 ENCOUNTER — Ambulatory Visit: Payer: 59 | Admitting: Internal Medicine

## 2017-08-09 VITALS — BP 104/62 | HR 104 | Resp 16 | Ht 66.5 in | Wt 193.0 lb

## 2017-08-09 DIAGNOSIS — G4733 Obstructive sleep apnea (adult) (pediatric): Secondary | ICD-10-CM | POA: Diagnosis not present

## 2017-08-09 NOTE — Patient Instructions (Signed)
--

## 2017-08-09 NOTE — Progress Notes (Signed)
[facility] Consultation      Assessment and Plan:  Obstructive sleep apnea. -The patient has a remote history of obstructive sleep apnea, going back more than 20 years ago.  He had been using CPAP but then stopped approximately 5 years ago, he has recurrent symptoms of excessive daytime sleepiness. -We will send for a new sleep study, start on CPAP as appropriate.  Diabetes mellitus, essential hypertension. -Obstructive sleep apnea can contribute to both diabetes and hypertension, therefore it is important the patient has his obstructive sleep apnea adequately treated to help contribute to better hypertension and diabetes control.   Date: 08/09/2017  MRN# [id number] Calvin Smith [birth year]   Calvin Smith is a 57 y.o. old male seen in consultation for chief complaint of:    Chief Complaint  Patient presents with  . Advice Only    referred by Dr. Silvio Pate for evaluation of sleep apnea.Pt c/o daytime fatigue,snoring and witnessed apnea.    HPI:   Patient presents with complaints of loud snoring, witnessed apneas.  He notes that he is very tired during the day.  He usually goes to bed between 11 PM and midnight.  He falls asleep quickly, he usually gets out of bed at 6 or 7 AM, feeling tired and unrested.  Epworth score is elevated at 12 today. He was diagnosed with OSA at least 20 years ago. He used cpap for years, but started having trouble with it and stopped using it about 5 years ago.  When he stopped using it his wife noted that he was no longer snoring because he has lost some weight. However recently he is snoring and having apneas again.    PMHX:   Past Medical History:  Diagnosis Date  . Allergic rhinitis   . Arm fracture, left as a child  . DeQuervain's disease (tenosynovitis)    right wrist  . Diabetes mellitus    type 2  . Erectile dysfunction   . Hyperlipidemia   . Hypertension   . Migraines   . Sleep apnea    Surgical Hx:  Past  Surgical History:  Procedure Laterality Date  . Cardiolite  5/06   Negative EF 66%  . CARPAL TUNNEL RELEASE  9/03   left   . WRIST SURGERY  1/04   DeQuervain release  . WRIST SURGERY  1/08   ORIF l wrist DVR plate screwhead autologous   Family Hx:  Family History  Problem Relation Age of Onset  . Hypertension Father   . Lymphoma Father        non-hodgkins  . Uterine cancer Maternal Grandmother   . Hypertension Paternal Grandfather   . Lung cancer Paternal Aunt   . Coronary artery disease Paternal Uncle   . Colon cancer Unknown        in 1 relative (?aunt)   Social Hx:   Social History   Tobacco Use  . Smoking status: Never Smoker  . Smokeless tobacco: Never Used  Substance Use Topics  . Alcohol use: Yes    Comment: rare  . Drug use: No   Medication:    Current Outpatient Medications:  .  Alcohol Swabs (B-D SINGLE USE SWABS REGULAR) PADS, USE AS INSTRUCTED TO TEST  BLOOD SUGAR TWO TIMES A DAY, Disp: 200 each, Rfl: 4 .  aspirin EC 81 MG tablet, Take 81 mg by mouth daily., Disp: , Rfl:  .  atorvastatin (LIPITOR) 20 MG tablet, Take 1 tablet (20 mg total) by mouth  daily., Disp: 90 tablet, Rfl: 3 .  diclofenac (VOLTAREN) 75 MG EC tablet, Take 1 tablet (75 mg total) by mouth 2 (two) times daily., Disp: 30 tablet, Rfl: 0 .  glipiZIDE (GLIPIZIDE XL) 5 MG 24 hr tablet, Take 1 tablet (5 mg total) by mouth 2 (two) times daily., Disp: 60 tablet, Rfl: 0 .  glucose blood (ONE TOUCH TEST STRIPS) test strip, Use as instructed to test blood sugar two times a day dx 250.00, Disp: 200 each, Rfl: 3 .  Insulin Pen Needle (BD PEN NEEDLE NANO U/F) 32G X 4 MM MISC, Inject once a day under  skin, Disp: 90 each, Rfl: 3 .  INVOKANA 300 MG TABS tablet, TAKE 1 TABLET BY MOUTH  DAILY BEFORE BREAKFAST, Disp: 90 tablet, Rfl: 3 .  JANUVIA 100 MG tablet, TAKE 1 TABLET BY MOUTH  DAILY, Disp: 90 tablet, Rfl: 3 .  LANTUS SOLOSTAR 100 UNIT/ML Solostar Pen, INJECT 58 UNITS  SUBCUTANEOUSLY AT BEDTIME, Disp: 60  mL, Rfl: 2 .  lisinopril (PRINIVIL,ZESTRIL) 20 MG tablet, Take 1 tablet (20 mg total) by mouth daily., Disp: 90 tablet, Rfl: 3 .  metFORMIN (GLUCOPHAGE) 1000 MG tablet, TAKE 1 TABLET BY MOUTH  TWICE A DAY WITH MEALS, Disp: 180 tablet, Rfl: 3 .  ONETOUCH DELICA LANCETS 88F MISC, 1 Units by Does not apply route 2 (two) times daily before a meal., Disp: 200 each, Rfl: 4 .  rizatriptan (MAXALT) 10 MG tablet, TAKE 1 TABLET BY MOUTH  DAILY AS NEEDED FOR  MIGRAINE. MAY REPEAT IN 2  HOURS IF NEEDED, Disp: 18 tablet, Rfl: 3 .  tadalafil (CIALIS) 20 MG tablet, Take 0.5-1 tablets (10-20 mg total) by mouth every other day as needed for erectile dysfunction., Disp: 5 tablet, Rfl: 11   Allergies:  Bisoprolol-hydrochlorothiazide and Codeine  Review of Systems: Gen:  Denies  fever, sweats, chills HEENT: Denies blurred vision, double vision. bleeds, sore throat Cvc:  No dizziness, chest pain. Resp:   Denies cough or sputum production, shortness of breath Gi: Denies swallowing difficulty, stomach pain. Gu:  Denies bladder incontinence, burning urine Ext:   No Joint pain, stiffness. Skin: No skin rash,  hives  Endoc:  No polyuria, polydipsia. Psych: No depression, insomnia. Other:  All other systems were reviewed with the patient and were negative other that what is mentioned in the HPI.   Physical Examination:   VS: BP 104/62 (BP Location: Left Arm, Cuff Size: Normal)   Pulse (!) 104   Resp 16   Ht 5' 6.5" (1.689 m)   Wt 193 lb (87.5 kg)   SpO2 96%   BMI 30.68 kg/m   General Appearance: No distress  Neuro:without focal findings,  speech normal,  HEENT: PERRLA, EOM intact.  Mallampati 4 Pulmonary: normal breath sounds, No wheezing.  CardiovascularNormal S1,S2.  No m/r/g.   Abdomen: Benign, Soft, non-tender. Renal:  No costovertebral tenderness  GU:  No performed at this time. Endoc: No evident thyromegaly, no signs of acromegaly. Skin:   warm, no rashes, no ecchymosis  Extremities: normal,  no cyanosis, clubbing.  Other findings:    LABORATORY PANEL:   CBC No results for input(s): WBC, HGB, HCT, PLT in the last 168 hours. ------------------------------------------------------------------------------------------------------------------  Chemistries  No results for input(s): NA, K, CL, CO2, GLUCOSE, BUN, CREATININE, CALCIUM, MG, AST, ALT, ALKPHOS, BILITOT in the last 168 hours.  Invalid input(s): GFRCGP ------------------------------------------------------------------------------------------------------------------  Cardiac Enzymes No results for input(s): TROPONINI in the last 168 hours. ------------------------------------------------------------  RADIOLOGY:  No  results found.     Thank  you for the consultation and for allowing [facility] to assist in the care of your patient. Our recommendations are noted above.  Please contact us if we can be of further service.   Marda Stalker, MD.  Board Certified in Internal Medicine, Pulmonary Medicine, [place], and Sleep Medicine.  [facility] Office Number: [phone]  Patricia Pesa, M.D.  Merton Border, M.D  08/09/2017

## 2017-08-18 ENCOUNTER — Other Ambulatory Visit: Payer: Self-pay | Admitting: Internal Medicine

## 2017-08-19 ENCOUNTER — Other Ambulatory Visit: Payer: Self-pay | Admitting: Internal Medicine

## 2017-08-29 ENCOUNTER — Encounter: Payer: Self-pay | Admitting: Internal Medicine

## 2017-08-29 DIAGNOSIS — G4733 Obstructive sleep apnea (adult) (pediatric): Secondary | ICD-10-CM

## 2017-08-31 DIAGNOSIS — G4733 Obstructive sleep apnea (adult) (pediatric): Secondary | ICD-10-CM | POA: Diagnosis not present

## 2017-09-07 ENCOUNTER — Telehealth: Payer: Self-pay | Admitting: *Deleted

## 2017-09-07 DIAGNOSIS — G4733 Obstructive sleep apnea (adult) (pediatric): Secondary | ICD-10-CM

## 2017-09-07 NOTE — Telephone Encounter (Signed)
Attempted to call patient on both home and cell #s. There is not a voicemail option on either. Calling with results of HST.  Severe OSA AHI 32. Auto-cpap with pressure range of 5-20 cm H20. 102 total apnea.

## 2017-09-10 NOTE — Telephone Encounter (Signed)
2nd attempt for patient to return call. There is no voicemail option.

## 2017-09-11 ENCOUNTER — Telehealth: Payer: Self-pay | Admitting: *Deleted

## 2017-09-11 ENCOUNTER — Encounter: Payer: Self-pay | Admitting: *Deleted

## 2017-09-11 DIAGNOSIS — G4733 Obstructive sleep apnea (adult) (pediatric): Secondary | ICD-10-CM

## 2017-09-11 NOTE — Telephone Encounter (Signed)
Pt aware of results of sleep study. Orders placed  Nothing further needed. 

## 2017-09-11 NOTE — Telephone Encounter (Signed)
3rd attempt will mail letter.

## 2017-10-02 DIAGNOSIS — G4733 Obstructive sleep apnea (adult) (pediatric): Secondary | ICD-10-CM | POA: Diagnosis not present

## 2017-10-31 ENCOUNTER — Encounter: Payer: Self-pay | Admitting: Internal Medicine

## 2017-11-09 NOTE — Progress Notes (Signed)
[facility] Consultation      Assessment and Plan:  Obstructive sleep apnea. -The patient has a remote history of obstructive sleep apnea, going back more than 20 years ago.  Most recent sleep study, HST on 08/29/17 showed severe OSA with AHI 32 -Currently on auto CPAP pressure range 5-20.  Will change pressure range to 7-14.  Diabetes mellitus, essential hypertension. -Obstructive sleep apnea can contribute to both diabetes and hypertension, therefore it is important the patient has his obstructive sleep apnea adequately treated to help contribute to better hypertension and diabetes control.   Date: 11/09/2017  MRN# [id number] Calvin Smith [birth year]   Calvin Smith is a 57 y.o. old male seen in consultation for chief complaint of:    Chief Complaint  Patient presents with  . Sleep Apnea    DME:[facility] pt states he wears cpap at least 6 hrs. He is not having any trouble at this time,.    HPI:  Patient was initially seen with symptoms of excessive daytime sleepiness. He was sent for an HST on 08/29/17 which found severe Severe OSA with AHI 32, Auto-cpap with pressure range of 5-20 cm H20 was started.  He is using it every night and feels more better during the day, his wife notes no more snoring. He is using a nasal mask. He is cleaning his supplies once per week and feels that overall he is doing great.   Review of download data, personally reviewed 30 days as of 11/11/17.  Uses greater than 4 hours; 30/30 days.  Average usage on days used 6 hours 35 minutes.  Pressure range 5-20.  Median pressure is 8, 95th percentile pressure is 9, maximum pressure is 10, residual AHI 0.7, showing excellent compliance and excellent control of obstructive sleep apnea with CPAP.  Return in about 1 year (around 11/13/2018).   Medication:    Current Outpatient Medications:  .  Alcohol Swabs (B-D SINGLE USE SWABS REGULAR) PADS, USE AS INSTRUCTED TO TEST  BLOOD SUGAR TWO TIMES A DAY,  Disp: 200 each, Rfl: 4 .  aspirin EC 81 MG tablet, Take 81 mg by mouth daily., Disp: , Rfl:  .  atorvastatin (LIPITOR) 20 MG tablet, TAKE 1 TABLET BY MOUTH  DAILY, Disp: 90 tablet, Rfl: 3 .  diclofenac (VOLTAREN) 75 MG EC tablet, Take 1 tablet (75 mg total) by mouth 2 (two) times daily., Disp: 30 tablet, Rfl: 0 .  glipiZIDE (GLIPIZIDE XL) 5 MG 24 hr tablet, Take 1 tablet (5 mg total) by mouth 2 (two) times daily., Disp: 60 tablet, Rfl: 0 .  glucose blood (ONE TOUCH TEST STRIPS) test strip, Use as instructed to test blood sugar two times a day dx 250.00, Disp: 200 each, Rfl: 3 .  Insulin Pen Needle (BD PEN NEEDLE NANO U/F) 32G X 4 MM MISC, Inject once a day under  skin, Disp: 90 each, Rfl: 3 .  INVOKANA 300 MG TABS tablet, TAKE 1 TABLET BY MOUTH  DAILY BEFORE BREAKFAST, Disp: 90 tablet, Rfl: 3 .  JANUVIA 100 MG tablet, TAKE 1 TABLET BY MOUTH  DAILY, Disp: 90 tablet, Rfl: 3 .  LANTUS SOLOSTAR 100 UNIT/ML Solostar Pen, INJECT 58 UNITS  SUBCUTANEOUSLY AT BEDTIME, Disp: 60 mL, Rfl: 2 .  lisinopril (PRINIVIL,ZESTRIL) 20 MG tablet, TAKE 1 TABLET BY MOUTH  DAILY, Disp: 90 tablet, Rfl: 3 .  metFORMIN (GLUCOPHAGE) 1000 MG tablet, TAKE 1 TABLET BY MOUTH  TWICE A DAY WITH MEALS, Disp: 180 tablet, Rfl: 3 .  ONETOUCH DELICA LANCETS 32P MISC, 1 Units by Does not apply route 2 (two) times daily before a meal., Disp: 200 each, Rfl: 4 .  rizatriptan (MAXALT) 10 MG tablet, TAKE 1 TABLET BY MOUTH  DAILY AS NEEDED FOR  MIGRAINE. MAY REPEAT IN 2  HOURS IF NEEDED, Disp: 18 tablet, Rfl: 3 .  tadalafil (CIALIS) 20 MG tablet, Take 0.5-1 tablets (10-20 mg total) by mouth every other day as needed for erectile dysfunction., Disp: 5 tablet, Rfl: 11   Allergies:  Bisoprolol-hydrochlorothiazide and Codeine  Review of Systems: Gen:  Denies  fever, sweats, chills HEENT: Denies blurred vision, double vision. bleeds, sore throat Cvc:  No dizziness, chest pain. Resp:   Denies cough or sputum production, shortness of breath Gi:  Denies swallowing difficulty, stomach pain. Gu:  Denies bladder incontinence, burning urine Ext:   No Joint pain, stiffness. Skin: No skin rash,  hives  Endoc:  No polyuria, polydipsia. Psych: No depression, insomnia. Other:  All other systems were reviewed with the patient and were negative other that what is mentioned in the HPI.   Physical Examination:   VS: BP 106/60 (BP Location: Left Arm, Cuff Size: Normal)   Pulse 95   Resp 16   Ht 5' 6.5" (1.689 m)   Wt 194 lb (88 kg)   SpO2 96%   BMI 30.84 kg/m   General Appearance: No distress  Neuro:without focal findings,  speech normal,  HEENT: PERRLA, EOM intact.  Mallampati 4 Pulmonary: normal breath sounds, No wheezing.  CardiovascularNormal S1,S2.  No m/r/g.   Abdomen: Benign, Soft, non-tender. Renal:  No costovertebral tenderness  GU:  No performed at this time. Endoc: No evident thyromegaly, no signs of acromegaly. Skin:   warm, no rashes, no ecchymosis  Extremities: normal, no cyanosis, clubbing.  Other findings:    LABORATORY PANEL:   CBC No results for input(s): WBC, HGB, HCT, PLT in the last 168 hours. ------------------------------------------------------------------------------------------------------------------  Chemistries  No results for input(s): NA, K, CL, CO2, GLUCOSE, BUN, CREATININE, CALCIUM, MG, AST, ALT, ALKPHOS, BILITOT in the last 168 hours.  Invalid input(s): GFRCGP ------------------------------------------------------------------------------------------------------------------  Cardiac Enzymes No results for input(s): TROPONINI in the last 168 hours. ------------------------------------------------------------  RADIOLOGY:  No results found.     Thank  you for the consultation and for allowing [facility] to assist in the care of your patient. Our recommendations are noted above.  Please contact Calvin Smith if we can be of further service.   Marda Stalker, MD.    Board Certified in Internal Medicine, Pulmonary Medicine, [place], and Sleep Medicine.  [facility] Office Number: [phone]  Patricia Pesa, M.D.  Merton Border, M.D  11/09/2017

## 2017-11-11 ENCOUNTER — Encounter: Payer: Self-pay | Admitting: Internal Medicine

## 2017-11-12 ENCOUNTER — Encounter: Payer: Self-pay | Admitting: Internal Medicine

## 2017-11-12 ENCOUNTER — Ambulatory Visit (INDEPENDENT_AMBULATORY_CARE_PROVIDER_SITE_OTHER): Payer: 59 | Admitting: Internal Medicine

## 2017-11-12 VITALS — BP 106/60 | HR 95 | Resp 16 | Ht 66.5 in | Wt 194.0 lb

## 2017-11-12 DIAGNOSIS — G4733 Obstructive sleep apnea (adult) (pediatric): Secondary | ICD-10-CM | POA: Diagnosis not present

## 2017-11-12 NOTE — Patient Instructions (Signed)
Will change pressure range to 7-14 cm H2O.  Continue using CPAP every night.

## 2017-11-15 DIAGNOSIS — J180 Bronchopneumonia, unspecified organism: Secondary | ICD-10-CM | POA: Diagnosis not present

## 2017-11-18 DIAGNOSIS — J180 Bronchopneumonia, unspecified organism: Secondary | ICD-10-CM | POA: Diagnosis not present

## 2017-12-09 IMAGING — DX DG ABDOMEN 2V
4 series · 4 of 4 positions shown · non-contrast
Comparison: None.

CLINICAL DATA: Abdominal pain

EXAM:
ABDOMEN - 2 VIEW

[abdomen erect (1 of 2)]
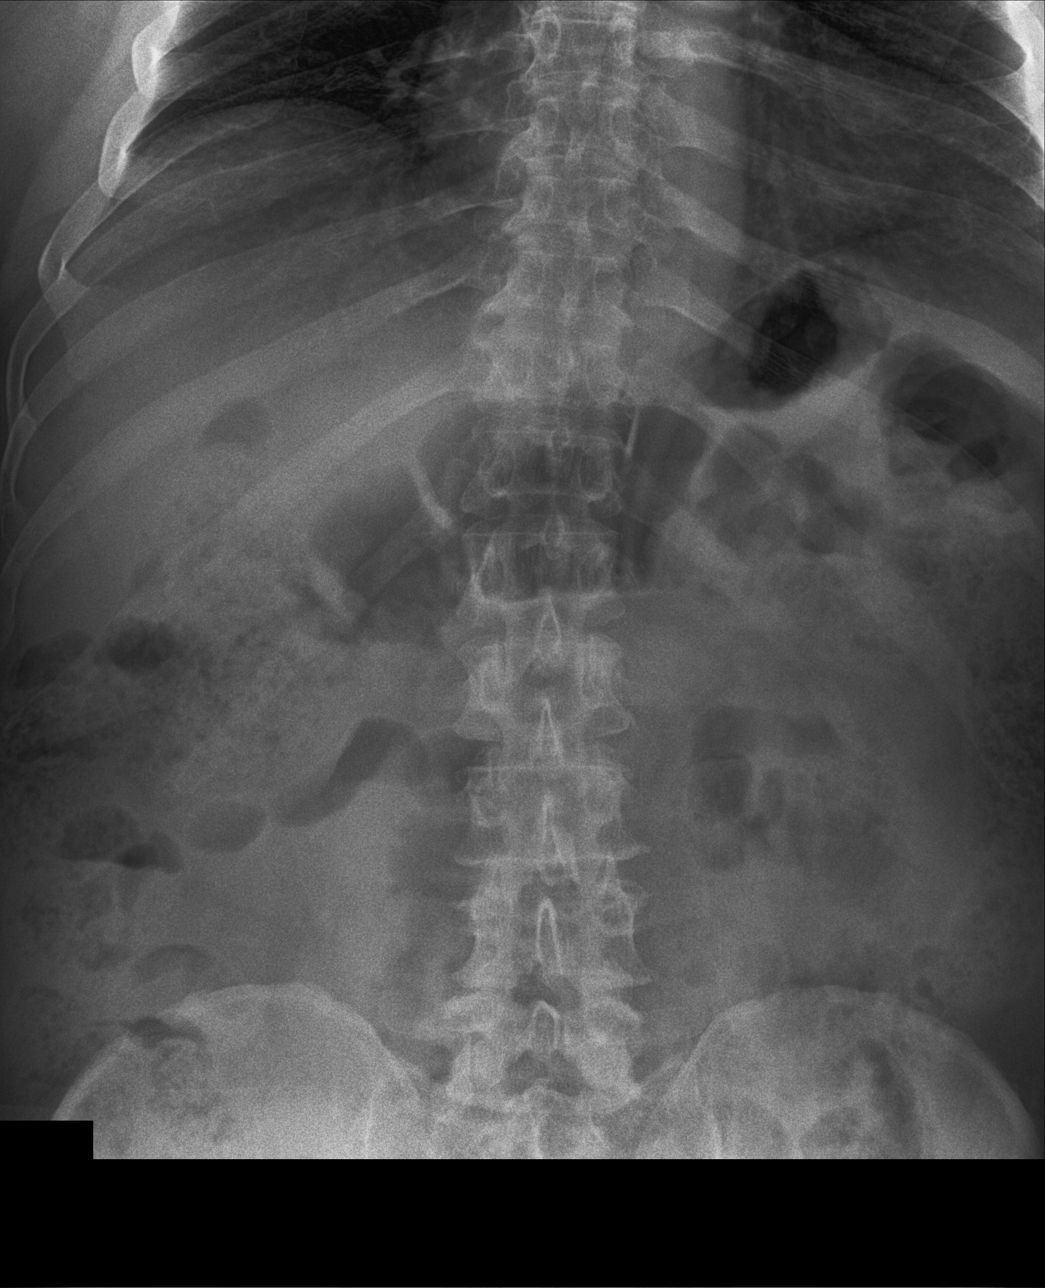

[abdomen supine (1 of 2)]
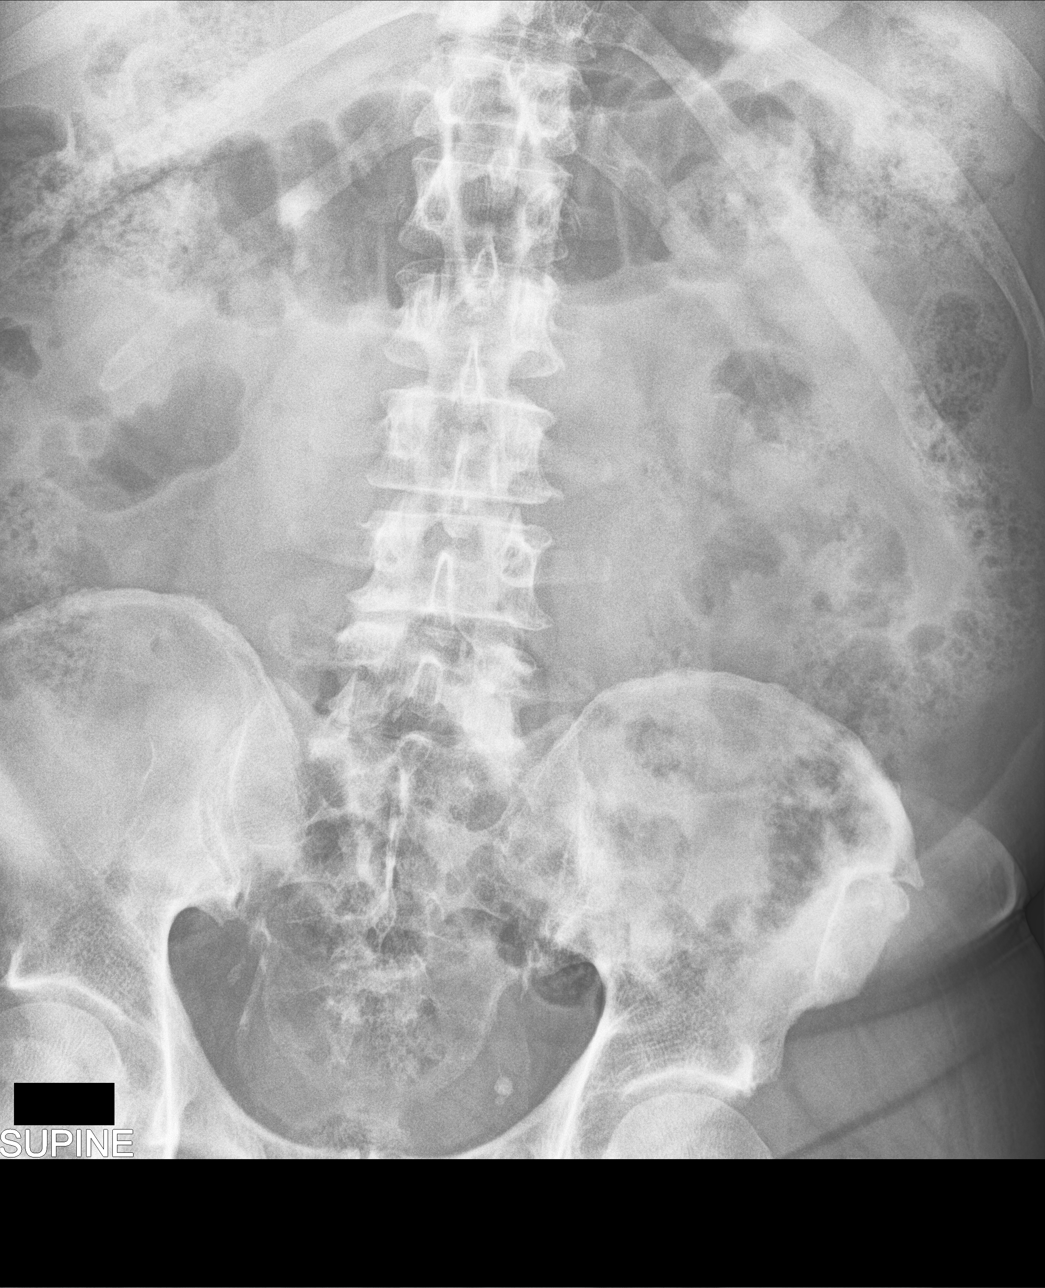

[abdomen supine (2 of 2)]
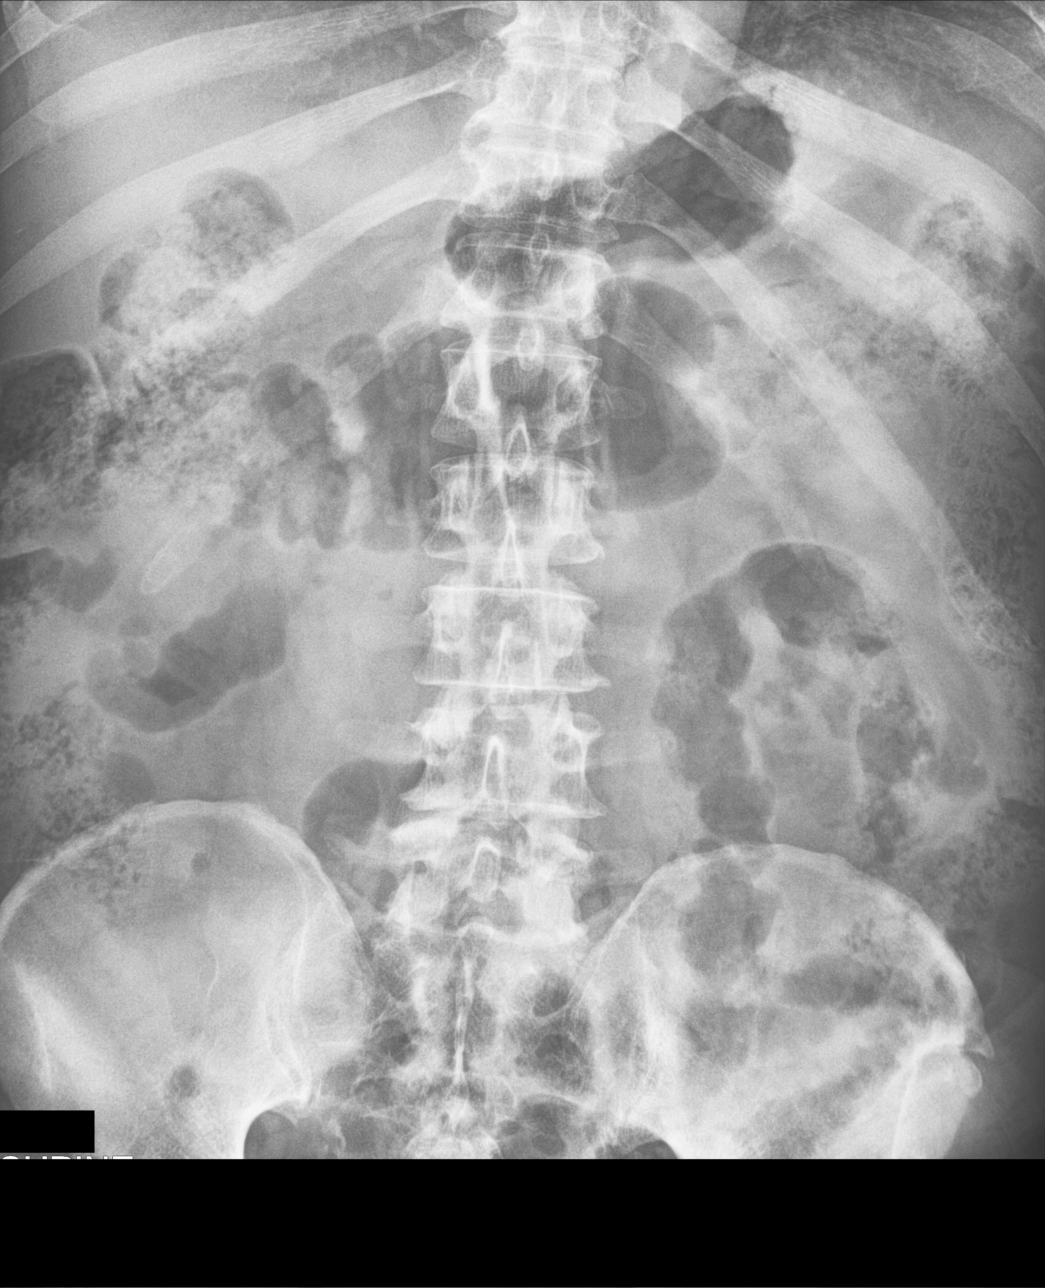

[abdomen erect (2 of 2)]
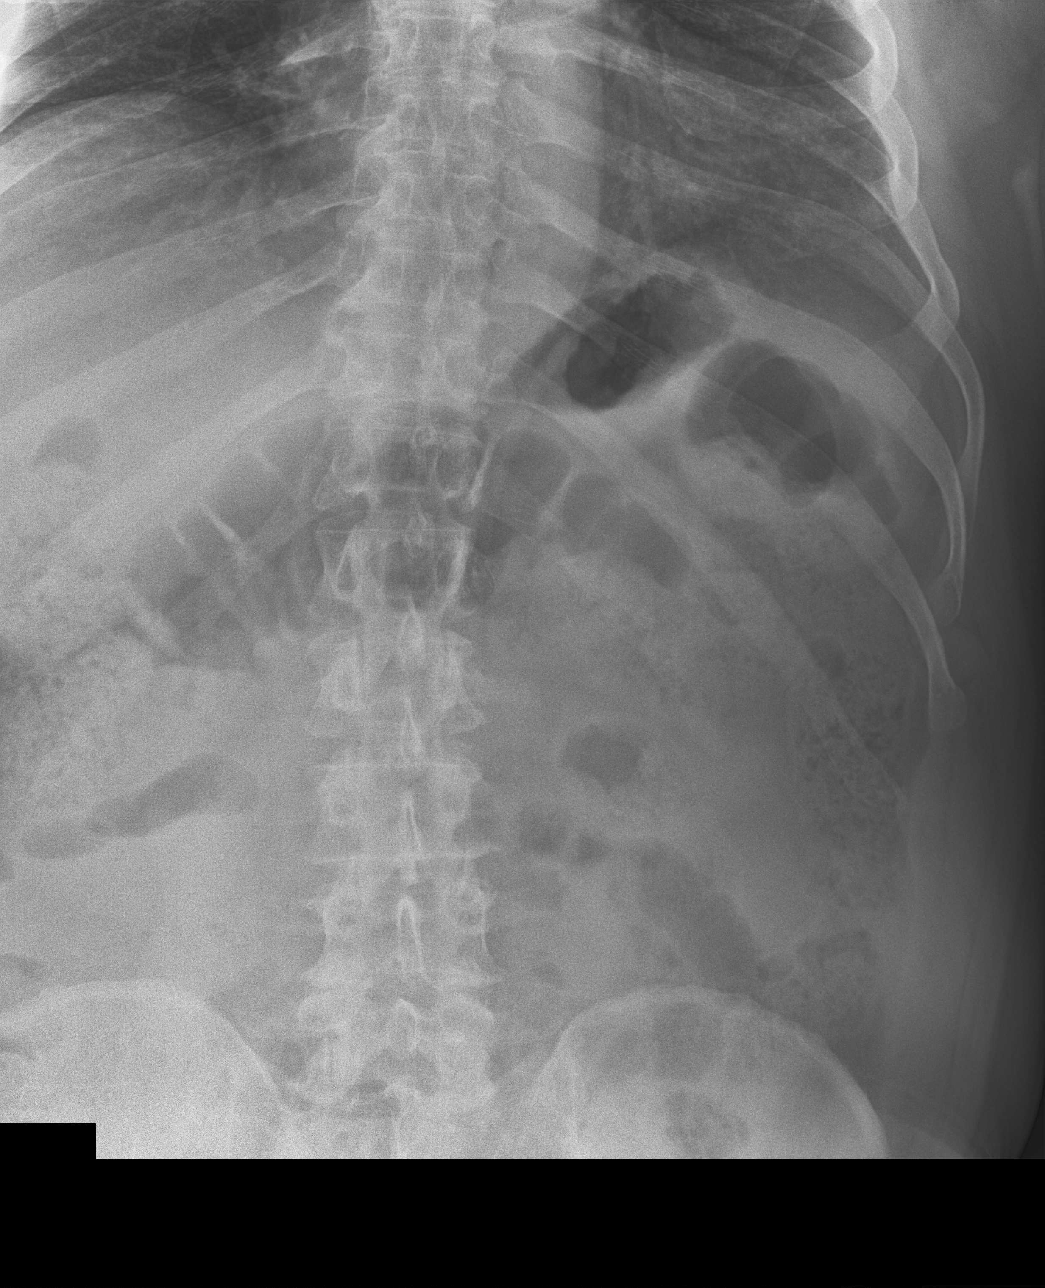

[4 of 4 positions shown; findings below may reference images not displayed]

FINDINGS: There are no disproportionally dilated loops of bowel. Gas-filled
colon containing fluid and stool is present. There is no free
intraperitoneal gas or pneumatosis. No pneumatosis. Pelvic
phleboliths.
IMPRESSION: Nonobstructive bowel gas pattern.

## 2017-12-22 ENCOUNTER — Other Ambulatory Visit: Payer: Self-pay | Admitting: Internal Medicine

## 2018-01-04 DIAGNOSIS — G4733 Obstructive sleep apnea (adult) (pediatric): Secondary | ICD-10-CM | POA: Diagnosis not present

## 2018-01-12 ENCOUNTER — Other Ambulatory Visit: Payer: Self-pay | Admitting: Internal Medicine

## 2018-01-22 ENCOUNTER — Encounter: Payer: Self-pay | Admitting: Internal Medicine

## 2018-01-22 ENCOUNTER — Ambulatory Visit: Payer: 59 | Admitting: Internal Medicine

## 2018-01-22 VITALS — BP 100/70 | HR 100 | Temp 98.6°F | Ht 67.0 in | Wt 190.0 lb

## 2018-01-22 DIAGNOSIS — E1140 Type 2 diabetes mellitus with diabetic neuropathy, unspecified: Secondary | ICD-10-CM

## 2018-01-22 DIAGNOSIS — Z125 Encounter for screening for malignant neoplasm of prostate: Secondary | ICD-10-CM

## 2018-01-22 LAB — HM DIABETES FOOT EXAM

## 2018-01-22 MED ORDER — INSULIN GLARGINE 100 UNIT/ML SOLOSTAR PEN: PEN_INJECTOR | SUBCUTANEOUS | 0 refills | Status: DC

## 2018-01-22 NOTE — Assessment & Plan Note (Signed)
Seems to still have acceptable control Will check labs He is working on lifestyle

## 2018-01-22 NOTE — Progress Notes (Signed)
Subjective:    Patient ID: Calvin Smith, male    DOB: [birth year], 57 y.o.   MRN: [id number]  HPI Here for follow up of diabetes  Continues on the same insulin dose Lifestyle is stable---"not great--moderate" Checks sugars most mornings----- 125-145 generally (occasionally on either side) Had slight symptoms with an 83 measurement last week Slight numbness in feet---this is stable  No chest pain No SOB Did have 1 dizzy spell --after working hard outside  Current Outpatient Medications on File Prior to Visit  Medication Sig Dispense Refill  . Alcohol Swabs (B-D SINGLE USE SWABS REGULAR) PADS USE AS INSTRUCTED TO TEST  BLOOD SUGAR TWO TIMES A DAY 200 each 4  . aspirin EC 81 MG tablet Take 81 mg by mouth daily.    Marland Kitchen atorvastatin (LIPITOR) 20 MG tablet TAKE 1 TABLET BY MOUTH  DAILY 90 tablet 3  . glipiZIDE (GLIPIZIDE XL) 5 MG 24 hr tablet Take 1 tablet (5 mg total) by mouth 2 (two) times daily. 60 tablet 0  . glucose blood (ONE TOUCH TEST STRIPS) test strip Use as instructed to test blood sugar two times a day dx 250.00 200 each 3  . Insulin Pen Needle (BD PEN NEEDLE NANO U/F) 32G X 4 MM MISC Inject once a day under  skin 90 each 3  . INVOKANA 300 MG TABS tablet TAKE 1 TABLET BY MOUTH  DAILY BEFORE BREAKFAST 90 tablet 3  . JANUVIA 100 MG tablet TAKE 1 TABLET BY MOUTH  DAILY 90 tablet 3  . LANTUS SOLOSTAR 100 UNIT/ML Solostar Pen INJECT 58 UNITS  SUBCUTANEOUSLY AT BEDTIME (Patient taking differently: inject 40 units in am 50 units in pm) 60 mL 2  . lisinopril (PRINIVIL,ZESTRIL) 20 MG tablet TAKE 1 TABLET BY MOUTH  DAILY 90 tablet 3  . metFORMIN (GLUCOPHAGE) 1000 MG tablet TAKE 1 TABLET BY MOUTH  TWICE A DAY WITH MEALS 180 tablet 3  . ONETOUCH DELICA LANCETS 78I MISC 1 Units by Does not apply route 2 (two) times daily before a meal. 200 each 4  . rizatriptan (MAXALT) 10 MG tablet TAKE 1 TABLET BY MOUTH  DAILY AS NEEDED FOR  MIGRAINE. MAY REPEAT IN 2  HOURS IF NEEDED 18 tablet 3  .  tadalafil (CIALIS) 20 MG tablet Take 0.5-1 tablets (10-20 mg total) by mouth every other day as needed for erectile dysfunction. 5 tablet 11   No current facility-administered medications on file prior to visit.     Allergies  Allergen Reactions  . Bisoprolol-Hydrochlorothiazide     REACTION: E.D.  . Codeine     REACTION: itch    Past Medical History:  Diagnosis Date  . Allergic rhinitis   . Arm fracture, left as a child  . DeQuervain's disease (tenosynovitis)    right wrist  . Diabetes mellitus    type 2  . Erectile dysfunction   . Hyperlipidemia   . Hypertension   . Migraines   . Sleep apnea     Past Surgical History:  Procedure Laterality Date  . Cardiolite  5/06   Negative EF 66%  . CARPAL TUNNEL RELEASE  9/03   left   . WRIST SURGERY  1/04   DeQuervain release  . WRIST SURGERY  1/08   ORIF l wrist DVR plate screwhead autologous    Family History  Problem Relation Age of Onset  . Hypertension Father   . Lymphoma Father        non-hodgkins  . Uterine cancer  Maternal Grandmother   . Hypertension Paternal Grandfather   . Lung cancer Paternal Aunt   . Coronary artery disease Paternal Uncle   . Colon cancer Unknown        in 1 relative (?aunt)    Social History   Socioeconomic History  . Marital status: Married    Spouse name: Not on file  . Number of children: Not on file  . Years of education: Not on file  . Highest education level: Not on file  Occupational History  . 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 Financial resource strain: Not on file  . Food insecurity:    Worry: Not on file    Inability: Not on file  . Transportation needs:    Medical: Not on file    Non-medical: Not on file  Tobacco Use  . Smoking status: Never Smoker  . Smokeless tobacco: Never Used  Substance and Sexual Activity  . Alcohol use: Yes    Comment: rare  . Drug use: No  . Sexual activity: Yes    Partners:  Female  Lifestyle  . Physical activity:    Days per week: Not on file    Minutes per session: Not on file  . Stress: Not on file  Relationships  . Social connections:    Talks on phone: Not on file    Gets together: Not on file    Attends religious service: Not on file    Active member of club or organization: Not on file    Attends meetings of clubs or organizations: Not on file    Relationship status: Not on file  . Intimate partner violence:    Fear of current or ex partner: Not on file    Emotionally abused: Not on file    Physically abused: Not on file    Forced sexual activity: Not on file  Other Topics Concern  . Not on file  Social History Narrative   Regular exercise: light to moderate   Caffeine use: coffee daily   Review of Systems  Sleeps well---new CPAP machine. Wears every night Appetite is fine Working on controlling his weight--had gone up some but has gotten it back down     Objective:   Physical Exam  Constitutional: He appears well-developed. No distress.  Neck: No thyromegaly present.  Cardiovascular: Normal rate, regular rhythm, normal heart sounds and intact distal pulses. Exam reveals no gallop.  No murmur heard. Respiratory: Effort normal and breath sounds normal. No respiratory distress. He has no wheezes. He has no rales.  Musculoskeletal: He exhibits no edema.  Lymphadenopathy:    He has no cervical adenopathy.  Neurological:  Fairly normal sensation on plantar feet  Skin:  No foot lesions  Psychiatric: He has a normal mood and affect. His behavior is normal.           Assessment & Plan:

## 2018-01-23 LAB — CBC
HEMOGLOBIN: 15.6 g/dL (ref 13.0–17.7)
Hematocrit: 43.7 % (ref 37.5–51.0)
MCH: 32 pg (ref 26.6–33.0)
MCHC: 35.7 g/dL (ref 31.5–35.7)
MCV: 90 fL (ref 79–97)
Platelets: 240 10*3/uL (ref 150–450)
RBC: 4.87 x10E6/uL (ref 4.14–5.80)
RDW: 13.4 % (ref 12.3–15.4)
WBC: 7.9 10*3/uL (ref 3.4–10.8)

## 2018-01-23 LAB — LIPID PANEL
Chol/HDL Ratio: 3.6 ratio (ref 0.0–5.0)
Cholesterol, Total: 154 mg/dL (ref 100–199)
HDL: 43 mg/dL (ref >39)
LDL Calculated: 91 mg/dL (ref 0–99)
TRIGLYCERIDES: 101 mg/dL (ref 0–149)
VLDL Cholesterol Cal: 20 mg/dL (ref 5–40)

## 2018-01-23 LAB — PSA: PROSTATE SPECIFIC AG, SERUM: 0.6 ng/mL (ref 0.0–4.0)

## 2018-01-23 LAB — COMPREHENSIVE METABOLIC PANEL
A/G RATIO: 1.7 (ref 1.2–2.2)
ALT: 45 IU/L — AB (ref 0–44)
AST: 32 IU/L (ref 0–40)
Albumin: 4.4 g/dL (ref 3.5–5.5)
Alkaline Phosphatase: 59 IU/L (ref 39–117)
BILIRUBIN TOTAL: 1 mg/dL (ref 0.0–1.2)
BUN/Creatinine Ratio: 13 (ref 9–20)
BUN: 17 mg/dL (ref 6–24)
CALCIUM: 9.2 mg/dL (ref 8.7–10.2)
CHLORIDE: 99 mmol/L (ref 96–106)
CO2: 21 mmol/L (ref 20–29)
Creatinine, Ser: 1.27 mg/dL (ref 0.76–1.27)
GFR calc Af Amer: 72 mL/min/{1.73_m2} (ref >59)
GFR calc non Af Amer: 62 mL/min/{1.73_m2} (ref >59)
GLUCOSE: 100 mg/dL — AB (ref 65–99)
Globulin, Total: 2.6 g/dL (ref 1.5–4.5)
POTASSIUM: 4.6 mmol/L (ref 3.5–5.2)
Sodium: 135 mmol/L (ref 134–144)
Total Protein: 7 g/dL (ref 6.0–8.5)

## 2018-01-23 LAB — HEMOGLOBIN A1C
Est. average glucose Bld gHb Est-mCnc: 183 mg/dL
Hgb A1c MFr Bld: 8 % — ABNORMAL HIGH (ref 4.8–5.6)

## 2018-02-04 DIAGNOSIS — M65341 Trigger finger, right ring finger: Secondary | ICD-10-CM | POA: Diagnosis not present

## 2018-02-04 DIAGNOSIS — M79641 Pain in right hand: Secondary | ICD-10-CM | POA: Diagnosis not present

## 2018-02-05 ENCOUNTER — Other Ambulatory Visit: Payer: Self-pay | Admitting: Internal Medicine

## 2018-03-04 DIAGNOSIS — M79641 Pain in right hand: Secondary | ICD-10-CM | POA: Diagnosis not present

## 2018-03-04 DIAGNOSIS — M65341 Trigger finger, right ring finger: Secondary | ICD-10-CM | POA: Diagnosis not present

## 2018-04-18 DIAGNOSIS — M65341 Trigger finger, right ring finger: Secondary | ICD-10-CM | POA: Diagnosis not present

## 2018-05-20 LAB — HM DIABETES EYE EXAM

## 2018-05-22 ENCOUNTER — Encounter: Payer: Self-pay | Admitting: Internal Medicine

## 2018-05-29 ENCOUNTER — Other Ambulatory Visit: Payer: Self-pay | Admitting: Internal Medicine

## 2018-05-29 DIAGNOSIS — D2262 Melanocytic nevi of left upper limb, including shoulder: Secondary | ICD-10-CM | POA: Diagnosis not present

## 2018-05-29 DIAGNOSIS — Z1283 Encounter for screening for malignant neoplasm of skin: Secondary | ICD-10-CM | POA: Diagnosis not present

## 2018-05-29 DIAGNOSIS — D225 Melanocytic nevi of trunk: Secondary | ICD-10-CM | POA: Diagnosis not present

## 2018-05-30 ENCOUNTER — Other Ambulatory Visit: Payer: Self-pay | Admitting: Internal Medicine

## 2018-06-03 ENCOUNTER — Other Ambulatory Visit: Payer: Self-pay | Admitting: Internal Medicine

## 2018-06-26 ENCOUNTER — Other Ambulatory Visit: Payer: Self-pay | Admitting: Internal Medicine

## 2018-08-06 ENCOUNTER — Other Ambulatory Visit: Payer: Self-pay | Admitting: Internal Medicine

## 2018-08-08 NOTE — Telephone Encounter (Signed)
Left message for pt to call and verify his Lantus dosage. We have 40 in am and 50 in pm but his rx states 58 in pm. I want to make sure he gets the correct amount of pens.

## 2018-08-13 ENCOUNTER — Encounter

## 2018-08-13 ENCOUNTER — Ambulatory Visit (INDEPENDENT_AMBULATORY_CARE_PROVIDER_SITE_OTHER): Payer: 59 | Admitting: Internal Medicine

## 2018-08-13 ENCOUNTER — Encounter: Payer: Self-pay | Admitting: Internal Medicine

## 2018-08-13 VITALS — BP 110/70 | HR 92 | Temp 98.5°F | Ht 66.5 in | Wt 189.0 lb

## 2018-08-13 DIAGNOSIS — Z1211 Encounter for screening for malignant neoplasm of colon: Secondary | ICD-10-CM

## 2018-08-13 DIAGNOSIS — E291 Testicular hypofunction: Secondary | ICD-10-CM

## 2018-08-13 DIAGNOSIS — Z0000 Encounter for general adult medical examination without abnormal findings: Secondary | ICD-10-CM

## 2018-08-13 DIAGNOSIS — I10 Essential (primary) hypertension: Secondary | ICD-10-CM | POA: Diagnosis not present

## 2018-08-13 DIAGNOSIS — E1140 Type 2 diabetes mellitus with diabetic neuropathy, unspecified: Secondary | ICD-10-CM | POA: Diagnosis not present

## 2018-08-13 DIAGNOSIS — Z23 Encounter for immunization: Secondary | ICD-10-CM

## 2018-08-13 NOTE — Addendum Note (Signed)
Addended by: Lendon Collar on: 08/13/2018 09:46 AM   Modules accepted: Orders

## 2018-08-13 NOTE — Assessment & Plan Note (Signed)
BP Readings from Last 3 Encounters:  08/13/18 110/70  01/22/18 100/70  11/12/17 106/60   Acceptable levels

## 2018-08-13 NOTE — Addendum Note (Signed)
Addended by: Lendon Collar on: 08/13/2018 09:45 AM   Modules accepted: Orders

## 2018-08-13 NOTE — Assessment & Plan Note (Addendum)
Not as good with lifestyle If over 8%, will give 3 months to bring it back in line Would consider shots or topical Rx (cost would require the shots)

## 2018-08-13 NOTE — Assessment & Plan Note (Signed)
Ongoing ED Will recheck levels

## 2018-08-13 NOTE — Progress Notes (Signed)
Subjective:    Patient ID: Calvin Smith, male    DOB: 1960-11-26, 58 y.o.   MRN: [id number]  HPI Here for physical  Has a respiratory infection Started about 2 weeks ago---bad a week ago Now improving No clear fever---or low grade Slight SOB--seems better now Lots of cough--- improved but persists. Productive of clear mucus  Not checking sugars lately (past couple of months) Not great with lifestyle (claims 6/10) Weight is stable Not that much exercise---work has been stressful   Current Outpatient Medications on File Prior to Visit  Medication Sig Dispense Refill  . Alcohol Swabs (B-D SINGLE USE SWABS REGULAR) PADS USE AS INSTRUCTED TO TEST  BLOOD SUGAR TWO TIMES A DAY 200 each 4  . aspirin EC 81 MG tablet Take 81 mg by mouth daily.    Marland Kitchen atorvastatin (LIPITOR) 20 MG tablet TAKE 1 TABLET BY MOUTH  DAILY 90 tablet 0  . glipiZIDE (GLUCOTROL XL) 5 MG 24 hr tablet TAKE 1 TABLET BY MOUTH TWO  TIMES DAILY 180 tablet 3  . glucose blood (ONE TOUCH ULTRA TEST) test strip USE TO TEST BLOOD SUGAR TWO TIMES DAILY AS DIRECTED 200 each 3  . Insulin Glargine (LANTUS SOLOSTAR) 100 UNIT/ML Solostar Pen inject 40 units in am 50 units in pm 1 mL 0  . Insulin Pen Needle (BD PEN NEEDLE NANO U/F) 32G X 4 MM MISC Inject once a day under  skin 90 each 3  . INVOKANA 300 MG TABS tablet TAKE 1 TABLET BY MOUTH  DAILY BEFORE BREAKFAST 90 tablet 3  . JANUVIA 100 MG tablet TAKE 1 TABLET BY MOUTH  DAILY 90 tablet 3  . lisinopril (PRINIVIL,ZESTRIL) 20 MG tablet TAKE 1 TABLET BY MOUTH  DAILY 90 tablet 0  . metFORMIN (GLUCOPHAGE) 1000 MG tablet TAKE 1 TABLET BY MOUTH  TWICE A DAY WITH MEALS 180 tablet 3  . ONETOUCH DELICA LANCETS 96E MISC 1 Units by Does not apply route 2 (two) times daily before a meal. 200 each 4  . rizatriptan (MAXALT) 10 MG tablet TAKE 1 TABLET BY MOUTH  DAILY AS NEEDED FOR  MIGRAINE. MAY REPEAT IN 2  HOURS IF NEEDED 18 tablet 3  . tadalafil (CIALIS) 20 MG tablet Take 0.5-1 tablets (10-20 mg  total) by mouth every other day as needed for erectile dysfunction. 5 tablet 11   No current facility-administered medications on file prior to visit.     Allergies  Allergen Reactions  . Bisoprolol-Hydrochlorothiazide     REACTION: E.D.  . Codeine     REACTION: itch    Past Medical History:  Diagnosis Date  . Allergic rhinitis   . Arm fracture, left as a child  . DeQuervain's disease (tenosynovitis)    right wrist  . Diabetes mellitus    type 2  . Erectile dysfunction   . Hyperlipidemia   . Hypertension   . Migraines   . Sleep apnea     Past Surgical History:  Procedure Laterality Date  . Cardiolite  5/06   Negative EF 66%  . CARPAL TUNNEL RELEASE  9/03   left   . WRIST SURGERY  1/04   DeQuervain release  . WRIST SURGERY  1/08   ORIF l wrist DVR plate screwhead autologous    Family History  Problem Relation Age of Onset  . Hypertension Father   . Lymphoma Father        non-hodgkins  . Uterine cancer Maternal Grandmother   . Hypertension Paternal Grandfather   .  Lung cancer Paternal Aunt   . Coronary artery disease Paternal Uncle   . Colon cancer Unknown        in 1 relative (?aunt)    Social History   Socioeconomic History  . Marital status: Married    Spouse name: Not on file  . Number of children: Not on file  . Years of education: Not on file  . Highest education level: Not on file  Occupational History  . 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 Financial resource strain: Not on file  . Food insecurity:    Worry: Not on file    Inability: Not on file  . Transportation needs:    Medical: Not on file    Non-medical: Not on file  Tobacco Use  . Smoking status: Never Smoker  . Smokeless tobacco: Never Used  Substance and Sexual Activity  . Alcohol use: Yes    Comment: rare  . Drug use: No  . Sexual activity: Yes    Partners: Female  Lifestyle  . Physical activity:    Days per week:  Not on file    Minutes per session: Not on file  . Stress: Not on file  Relationships  . Social connections:    Talks on phone: Not on file    Gets together: Not on file    Attends religious service: Not on file    Active member of club or organization: Not on file    Attends meetings of clubs or organizations: Not on file    Relationship status: Not on file  . Intimate partner violence:    Fear of current or ex partner: Not on file    Emotionally abused: Not on file    Physically abused: Not on file    Forced sexual activity: Not on file  Other Topics Concern  . Not on file  Social History Narrative   Regular exercise: light to moderate   Caffeine use: coffee daily   Review of Systems  Constitutional: Negative for fatigue and unexpected weight change.       Wears seat belt  HENT: Negative for dental problem, hearing loss, tinnitus and trouble swallowing.        Keeps up with dentist  Eyes: Negative for visual disturbance.       No diplopia or unilateral vision loss  Respiratory: Positive for cough. Negative for chest tightness.   Cardiovascular: Negative for chest pain, palpitations and leg swelling.  Gastrointestinal: Negative for blood in stool and constipation.       Some increased heartburn--- no meds  Genitourinary: Negative for difficulty urinating and urgency.       Some ED---doesn't tolerate meds Would consider specialist  Musculoskeletal: Negative for arthralgias, back pain and joint swelling.  Skin: Negative for rash.       Recent derm evaluation  Allergic/Immunologic: Negative for environmental allergies and immunocompromised state.  Neurological: Positive for headaches. Negative for dizziness, syncope and light-headedness.  Hematological: Negative for adenopathy. Does not bruise/bleed easily.  Psychiatric/Behavioral: Negative for dysphoric mood. The patient is not nervous/anxious.        Chronic sleep problems--- 6 hours is the best. No change         Objective:   Physical Exam  Constitutional: He is oriented to person, place, and time. He appears well-developed. No distress.  HENT:  Head: Normocephalic and atraumatic.  Right Ear: External ear normal.  Left Ear: External  ear normal.  Mouth/Throat: Oropharynx is clear and moist. No oropharyngeal exudate.  Eyes: Pupils are equal, round, and reactive to light. Conjunctivae are normal.  Neck: No thyromegaly present.  Cardiovascular: Normal rate, regular rhythm, normal heart sounds and intact distal pulses. Exam reveals no gallop.  No murmur heard. Respiratory: Effort normal and breath sounds normal. No respiratory distress. He has no wheezes. He has no rales.  GI: Soft. There is no abdominal tenderness.  Musculoskeletal:        General: No tenderness or edema.  Lymphadenopathy:    He has no cervical adenopathy.  Neurological: He is alert and oriented to person, place, and time.  Skin: No rash noted. No erythema.  Psychiatric: He has a normal mood and affect. His behavior is normal.           Assessment & Plan:

## 2018-08-13 NOTE — Addendum Note (Signed)
Addended by: Pilar Grammes on: 08/13/2018 10:59 AM   Modules accepted: Orders

## 2018-08-13 NOTE — Assessment & Plan Note (Signed)
Healthy but has neglected his health--working too hard, etc Will check PSA Td booster Yearly flu vaccine

## 2018-08-14 ENCOUNTER — Other Ambulatory Visit: Payer: Self-pay | Admitting: Internal Medicine

## 2018-08-14 DIAGNOSIS — E1165 Type 2 diabetes mellitus with hyperglycemia: Secondary | ICD-10-CM

## 2018-08-15 LAB — TESTOSTERONE,FREE AND TOTAL
TESTOSTERONE: 277 ng/dL (ref 264–916)
Testosterone, Free: 9.2 pg/mL (ref 7.2–24.0)

## 2018-08-15 LAB — HEMOGLOBIN A1C
Est. average glucose Bld gHb Est-mCnc: 200 mg/dL
Hgb A1c MFr Bld: 8.6 % — ABNORMAL HIGH (ref 4.8–5.6)

## 2018-09-13 LAB — FECAL OCCULT BLOOD, IMMUNOCHEMICAL: Fecal Occult Bld: NEGATIVE

## 2018-09-19 ENCOUNTER — Other Ambulatory Visit: Payer: Self-pay | Admitting: Internal Medicine

## 2018-11-14 ENCOUNTER — Other Ambulatory Visit (INDEPENDENT_AMBULATORY_CARE_PROVIDER_SITE_OTHER): Payer: 59

## 2018-11-14 ENCOUNTER — Other Ambulatory Visit: Payer: 59

## 2018-11-14 ENCOUNTER — Other Ambulatory Visit: Payer: Self-pay

## 2018-11-14 DIAGNOSIS — E1165 Type 2 diabetes mellitus with hyperglycemia: Secondary | ICD-10-CM

## 2018-11-15 LAB — HEMOGLOBIN A1C
Est. average glucose Bld gHb Est-mCnc: 189 mg/dL
Hgb A1c MFr Bld: 8.2 % — ABNORMAL HIGH (ref 4.8–5.6)

## 2018-11-26 NOTE — Telephone Encounter (Signed)
-----   Message from [name], MD sent at 11/15/2018  8:27 AM EDT ----- Results released I have recommended starting trulicity (or comparable) Find out if he is willing---or wants to just wait another 3 months

## 2018-11-26 NOTE — Telephone Encounter (Signed)
Called Pt. To discuss if he wanted to start Rx Trulicity ( or comparable) or wait a few more months. Pt stated " kinda torn on starting or wating, GBS have been better as of late, will revisit this later" . Informed Pt to message through MyChart for any questions or wanting to have Rx sent . Task completed.

## 2019-01-16 LAB — LIPID PANEL
Cholesterol: 151 (ref 0–200)
HDL: 44 (ref 35–70)
LDL Cholesterol: 86

## 2019-01-16 LAB — HEMOGLOBIN A1C: Hemoglobin A1C: 7.5

## 2019-01-16 MED ORDER — LANTUS SOLOSTAR 100 UNIT/ML ~~LOC~~ SOPN: PEN_INJECTOR | SUBCUTANEOUS | 5 refills | Status: DC

## 2019-02-12 ENCOUNTER — Encounter: Payer: Self-pay | Admitting: Internal Medicine

## 2019-02-12 ENCOUNTER — Ambulatory Visit: Payer: 59 | Admitting: Internal Medicine

## 2019-02-12 ENCOUNTER — Other Ambulatory Visit: Payer: Self-pay

## 2019-02-12 VITALS — BP 110/80 | HR 88 | Ht 65.5 in | Wt 189.0 lb

## 2019-02-12 DIAGNOSIS — I10 Essential (primary) hypertension: Secondary | ICD-10-CM | POA: Diagnosis not present

## 2019-02-12 DIAGNOSIS — E1140 Type 2 diabetes mellitus with diabetic neuropathy, unspecified: Secondary | ICD-10-CM

## 2019-02-12 LAB — HM DIABETES FOOT EXAM

## 2019-02-12 NOTE — Progress Notes (Signed)
Subjective:    Patient ID: Calvin Smith, male    DOB: 01/10/1961, 58 y.o.   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 Alcohol Swabs (B-D SINGLE USE SWABS REGULAR) PADS USE AS INSTRUCTED TO TEST  BLOOD SUGAR TWO TIMES A DAY 200 each 4  . aspirin EC 81 MG tablet Take 81 mg by mouth daily.    Marland Kitchen atorvastatin (LIPITOR) 20 MG tablet TAKE 1 TABLET BY MOUTH  DAILY 90 tablet 3  . glipiZIDE (GLUCOTROL XL) 5 MG 24 hr tablet TAKE 1 TABLET BY MOUTH TWO  TIMES DAILY 180 tablet 3  . glucose blood (ONE TOUCH ULTRA TEST) test strip USE TO TEST BLOOD SUGAR TWO TIMES DAILY AS DIRECTED 200 each 3  . Insulin Glargine (LANTUS SOLOSTAR) 100 UNIT/ML Solostar Pen INJECT 90 UNITS  SUBCUTANEOUSLY AT BEDTIME 30 pen 5  . Insulin Pen Needle (BD PEN NEEDLE NANO U/F) 32G X 4 MM MISC Inject once a day under  skin 90 each 3  . INVOKANA 300 MG TABS tablet TAKE 1 TABLET BY MOUTH  DAILY BEFORE BREAKFAST 90 tablet 3  . JANUVIA 100 MG tablet TAKE 1 TABLET BY MOUTH  DAILY 90 tablet 3  . lisinopril (PRINIVIL,ZESTRIL) 20 MG tablet TAKE 1 TABLET BY MOUTH  DAILY 90 tablet 3  . metFORMIN (GLUCOPHAGE) 1000 MG tablet TAKE 1 TABLET BY MOUTH  TWICE A DAY WITH MEALS 180 tablet 3  . ONETOUCH DELICA LANCETS 60Y MISC 1 Units by Does not apply route 2 (two) times daily before a meal. 200 each 4  . rizatriptan (MAXALT) 10 MG tablet TAKE 1 TABLET BY MOUTH  DAILY AS NEEDED FOR  MIGRAINE. MAY REPEAT IN 2  HOURS IF NEEDED 18 tablet 3  . tadalafil (CIALIS) 20 MG tablet Take 0.5-1 tablets (10-20 mg total) by mouth every  other day as needed for erectile dysfunction. 5 tablet 11   No current facility-administered medications on file prior to visit.     Allergies  Allergen Reactions  . Bisoprolol-Hydrochlorothiazide     REACTION: E.D.  . Codeine     REACTION: itch    Past Medical History:  Diagnosis Date  . Allergic rhinitis   . Arm fracture, left as a child  . DeQuervain's disease (tenosynovitis)    right wrist  . Diabetes mellitus    type 2  . Erectile dysfunction   . Hyperlipidemia   . Hypertension   . Migraines   . Sleep apnea     Past Surgical History:  Procedure Laterality Date  . Cardiolite  5/06   Negative EF 66%  . CARPAL TUNNEL RELEASE  9/03   left   . WRIST SURGERY  1/04   DeQuervain release  . WRIST SURGERY  1/08   ORIF l wrist DVR plate screwhead autologous    Family History  Problem Relation Age of Onset  . Hypertension Father   . Lymphoma Father        non-hodgkins  . Uterine cancer Maternal Grandmother   . Hypertension Paternal Grandfather   .  Lung cancer Paternal Aunt   . Coronary artery disease Paternal Uncle   . Colon cancer Unknown        in 1 relative (?aunt)    Social History   Socioeconomic History  . Marital status: Married    Spouse name: Not on file  . Number of children: Not on file  . Years of education: Not on file  . Highest education level: Not on file  Occupational History  . 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 Financial resource strain: Not on file  . Food insecurity    Worry: Not on file    Inability: Not on file  . Transportation needs    Medical: Not on file    Non-medical: Not on file  Tobacco Use  . Smoking status: Never Smoker  . Smokeless tobacco: Never Used  Substance and Sexual Activity  . Alcohol use: Yes    Comment: rare  . Drug use: No  . Sexual activity: Yes    Partners: Female  Lifestyle  . Physical activity    Days per week: Not on file    Minutes  per session: Not on file  . Stress: Not on file  Relationships  . Social Herbalist on phone: Not on file    Gets together: Not on file    Attends religious service: Not on file    Active member of club or organization: Not on file    Attends meetings of clubs or organizations: Not on file    Relationship status: Not on file  . Intimate partner violence    Fear of current or ex partner: Not on file    Emotionally abused: Not on file    Physically abused: Not on file    Forced sexual activity: Not on file  Other Topics Concern  . Not on file  Social History Narrative   Regular exercise: light to moderate   Caffeine use: coffee daily   Review of Systems Weight is stable Sleeping okay    Objective:   Physical Exam  Constitutional: He appears well-developed. No distress.  Neck: No thyromegaly present.  Cardiovascular: Normal rate, regular rhythm, normal heart sounds and intact distal pulses. Exam reveals no gallop.  No murmur heard. Respiratory: Effort normal and breath sounds normal. No respiratory distress. He has no wheezes. He has no rales.  Musculoskeletal:        General: No edema.  Lymphadenopathy:    He has no cervical adenopathy.  Neurological:  Slightly decreased sensation in feet  Skin:  No foot lesions  Psychiatric: He has a normal mood and affect. His behavior is normal.           Assessment & Plan:

## 2019-02-12 NOTE — Assessment & Plan Note (Signed)
Control better when tested last month at work Will confirm today Very early neuropathy changes

## 2019-02-12 NOTE — Assessment & Plan Note (Signed)
BP Readings from Last 3 Encounters:  02/12/19 110/80  08/13/18 110/70  01/22/18 100/70   Good control

## 2019-02-13 LAB — COMPREHENSIVE METABOLIC PANEL
ALT: 27 IU/L (ref 0–44)
AST: 19 IU/L (ref 0–40)
Albumin/Globulin Ratio: 1.7 (ref 1.2–2.2)
Albumin: 4.5 g/dL (ref 3.8–4.9)
Alkaline Phosphatase: 70 IU/L (ref 39–117)
BUN/Creatinine Ratio: 12 (ref 9–20)
BUN: 15 mg/dL (ref 6–24)
Bilirubin Total: 0.5 mg/dL (ref 0.0–1.2)
CO2: 18 mmol/L — ABNORMAL LOW (ref 20–29)
Calcium: 9.7 mg/dL (ref 8.7–10.2)
Chloride: 101 mmol/L (ref 96–106)
Creatinine, Ser: 1.23 mg/dL (ref 0.76–1.27)
GFR calc Af Amer: 74 mL/min/{1.73_m2} (ref >59)
GFR calc non Af Amer: 64 mL/min/{1.73_m2} (ref >59)
Globulin, Total: 2.6 g/dL (ref 1.5–4.5)
Glucose: 99 mg/dL (ref 65–99)
Potassium: 4.7 mmol/L (ref 3.5–5.2)
Sodium: 139 mmol/L (ref 134–144)
Total Protein: 7.1 g/dL (ref 6.0–8.5)

## 2019-02-13 LAB — CBC
Hematocrit: 44.1 % (ref 37.5–51.0)
Hemoglobin: 15.5 g/dL (ref 13.0–17.7)
MCH: 32.4 pg (ref 26.6–33.0)
MCHC: 35.1 g/dL (ref 31.5–35.7)
MCV: 92 fL (ref 79–97)
Platelets: 219 10*3/uL (ref 150–450)
RBC: 4.79 x10E6/uL (ref 4.14–5.80)
RDW: 12.6 % (ref 11.6–15.4)
WBC: 7.6 10*3/uL (ref 3.4–10.8)

## 2019-02-13 LAB — HEMOGLOBIN A1C
Est. average glucose Bld gHb Est-mCnc: 174 mg/dL
Hgb A1c MFr Bld: 7.7 % — ABNORMAL HIGH (ref 4.8–5.6)

## 2019-02-13 LAB — LIPID PANEL
Chol/HDL Ratio: 3.5 ratio (ref 0.0–5.0)
Cholesterol, Total: 148 mg/dL (ref 100–199)
HDL: 42 mg/dL (ref >39)
LDL Calculated: 83 mg/dL (ref 0–99)
Triglycerides: 116 mg/dL (ref 0–149)
VLDL Cholesterol Cal: 23 mg/dL (ref 5–40)

## 2019-03-05 ENCOUNTER — Other Ambulatory Visit: Payer: Self-pay | Admitting: Internal Medicine

## 2019-03-12 ENCOUNTER — Other Ambulatory Visit: Payer: Self-pay

## 2019-03-12 MED ORDER — JARDIANCE 25 MG PO TABS: 25.0000 mg | ORAL_TABLET | Freq: Every day | ORAL | 3 refills | Status: DC

## 2019-03-12 NOTE — Telephone Encounter (Signed)
We received a form from [employer] stating they will no longer cover Invokana but they do cover Jardiance. Left detailed message on VM for pt to let him know we were making the change.

## 2019-05-05 ENCOUNTER — Other Ambulatory Visit: Payer: Self-pay | Admitting: Internal Medicine

## 2019-05-09 ENCOUNTER — Other Ambulatory Visit: Payer: Self-pay | Admitting: Internal Medicine

## 2019-05-29 LAB — HM DIABETES EYE EXAM

## 2019-05-30 ENCOUNTER — Other Ambulatory Visit: Payer: Self-pay | Admitting: Internal Medicine

## 2019-06-17 ENCOUNTER — Encounter: Payer: Self-pay | Admitting: Internal Medicine

## 2019-07-09 ENCOUNTER — Other Ambulatory Visit: Payer: Self-pay

## 2019-07-09 ENCOUNTER — Encounter: Payer: Self-pay | Admitting: Internal Medicine

## 2019-07-09 ENCOUNTER — Ambulatory Visit (INDEPENDENT_AMBULATORY_CARE_PROVIDER_SITE_OTHER): Payer: 59 | Admitting: Internal Medicine

## 2019-07-09 DIAGNOSIS — K219 Gastro-esophageal reflux disease without esophagitis: Secondary | ICD-10-CM | POA: Insufficient documentation

## 2019-07-09 NOTE — Assessment & Plan Note (Signed)
Clear history suggests GERD Will have him take omeprazole for 6 weeks or so If not better---GI If better, will change to prn or intermittent

## 2019-07-09 NOTE — Progress Notes (Signed)
Subjective:    Patient ID: Calvin Smith, male    DOB: Jan 02, 1961, 58 y.o.   MRN: [id number]  HPI Here for visit due to GI problems  This visit occurred during the SARS-CoV-2 public health emergency.  Safety protocols were in place, including screening questions prior to the visit, additional usage of staff PPE, and extensive cleaning of exam room while observing appropriate contact time as indicated for disinfecting solutions.   Having indigestion  Has gotten him up at night ---totally a new thing for him Constantly belching  Concerned about a "little hernia" by his sternum Started months ago---came in now due to nighttime awakening Gets substernal tightness--just sits up Has taken tums at times in the day---unclear if it has helped No dysphagia  Did have change of invokana to jardiance--but these symptoms started before then  Lots of decaf coffee No mints No sig change in diet  Current Outpatient Medications on File Prior to Visit  Medication Sig Dispense Refill  . Alcohol Swabs (B-D SINGLE USE SWABS REGULAR) PADS USE AS INSTRUCTED TO TEST  BLOOD SUGAR TWO TIMES A DAY 200 each 4  . aspirin EC 81 MG tablet Take 81 mg by mouth daily.    Marland Kitchen atorvastatin (LIPITOR) 20 MG tablet TAKE 1 TABLET BY MOUTH  DAILY 90 tablet 3  . empagliflozin (JARDIANCE) 25 MG TABS tablet Take 25 mg by mouth daily. 90 tablet 3  . glipiZIDE (GLUCOTROL XL) 5 MG 24 hr tablet TAKE 1 TABLET BY MOUTH TWO  TIMES DAILY 180 tablet 3  . Insulin Glargine (LANTUS SOLOSTAR) 100 UNIT/ML Solostar Pen INJECT 90 UNITS  SUBCUTANEOUSLY AT BEDTIME 30 pen 5  . Insulin Pen Needle (BD PEN NEEDLE NANO U/F) 32G X 4 MM MISC INJECT ONCE A DAY UNDER  SKIN 90 each 3  . JANUVIA 100 MG tablet TAKE 1 TABLET BY MOUTH  DAILY 90 tablet 3  . lisinopril (PRINIVIL,ZESTRIL) 20 MG tablet TAKE 1 TABLET BY MOUTH  DAILY 90 tablet 3  . metFORMIN (GLUCOPHAGE) 1000 MG tablet TAKE 1 TABLET BY MOUTH  TWICE A DAY WITH MEALS 180 tablet 3  . ONETOUCH  DELICA LANCETS 99991111 MISC 1 Units by Does not apply route 2 (two) times daily before a meal. 200 each 4  . ONETOUCH ULTRA test strip USE TO TEST BLOOD SUGAR TWO TIMES DAILY AS DIRECTED 200 strip 3  . rizatriptan (MAXALT) 10 MG tablet TAKE 1 TABLET BY MOUTH  DAILY AS NEEDED FOR  MIGRAINE. MAY REPEAT IN 2  HOURS IF NEEDED 18 tablet 3  . tadalafil (CIALIS) 20 MG tablet Take 0.5-1 tablets (10-20 mg total) by mouth every other day as needed for erectile dysfunction. 5 tablet 11   No current facility-administered medications on file prior to visit.     Allergies  Allergen Reactions  . Bisoprolol-Hydrochlorothiazide     REACTION: E.D.  . Codeine     REACTION: itch    Past Medical History:  Diagnosis Date  . Allergic rhinitis   . Arm fracture, left as a child  . DeQuervain's disease (tenosynovitis)    right wrist  . Diabetes mellitus    type 2  . Erectile dysfunction   . Hyperlipidemia   . Hypertension   . Migraines   . Sleep apnea     Past Surgical History:  Procedure Laterality Date  . Cardiolite  5/06   Negative EF 66%  . CARPAL TUNNEL RELEASE  9/03   left   . WRIST SURGERY  1/04   DeQuervain release  . WRIST SURGERY  1/08   ORIF l wrist DVR plate screwhead autologous    Family History  Problem Relation Age of Onset  . Hypertension Father   . Lymphoma Father        non-hodgkins  . Uterine cancer Maternal Grandmother   . Hypertension Paternal Grandfather   . Lung cancer Paternal Aunt   . Coronary artery disease Paternal Uncle   . Colon cancer Unknown        in 1 relative (?aunt)    Social History   Socioeconomic History  . Marital status: Married    Spouse name: Not on file  . Number of children: Not on file  . Years of education: Not on file  . Highest education level: Not on file  Occupational History  . 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 Financial resource strain: Not on file  . Food insecurity     Worry: Not on file    Inability: Not on file  . Transportation needs    Medical: Not on file    Non-medical: Not on file  Tobacco Use  . Smoking status: Never Smoker  . Smokeless tobacco: Never Used  Substance and Sexual Activity  . Alcohol use: Yes    Comment: rare  . Drug use: No  . Sexual activity: Yes    Partners: Female  Lifestyle  . Physical activity    Days per week: Not on file    Minutes per session: Not on file  . Stress: Not on file  Relationships  . Social Herbalist on phone: Not on file    Gets together: Not on file    Attends religious service: Not on file    Active member of club or organization: Not on file    Attends meetings of clubs or organizations: Not on file    Relationship status: Not on file  . Intimate partner violence    Fear of current or ex partner: Not on file    Emotionally abused: Not on file    Physically abused: Not on file    Forced sexual activity: Not on file  Other Topics Concern  . Not on file  Social History Narrative   Regular exercise: light to moderate   Caffeine use: coffee daily   Review of Systems  Appetite is okay Weight up slightly No N/V Bowels are moving normally     Objective:   Physical Exam  Constitutional: He appears well-developed. No distress.  Cardiovascular: Normal rate, regular rhythm and normal heart sounds. Exam reveals no gallop.  No murmur heard. Respiratory: Effort normal and breath sounds normal. No respiratory distress. He has no wheezes. He has no rales.  GI: Soft. He exhibits no distension. There is no abdominal tenderness. There is no rebound and no guarding.  Diastasis recti Small bulge at top of umbilicus--not a clear hernia   Musculoskeletal:        General: No edema.           Assessment & Plan:

## 2019-07-09 NOTE — Patient Instructions (Signed)
Please take over the counter omeprazole 20mg  daily on an empty stomach (bedtime would be good). If your symptoms are not improved in 2-4 weeks, I will set you up with a gastroenterologist.

## 2019-08-06 MED ORDER — TADALAFIL 20 MG PO TABS: 10.0000 mg | ORAL_TABLET | ORAL | 11 refills | Status: DC | PRN

## 2019-08-06 MED ORDER — RIZATRIPTAN BENZOATE 10 MG PO TABS: ORAL_TABLET | ORAL | 3 refills | Status: DC

## 2019-08-18 ENCOUNTER — Other Ambulatory Visit: Payer: Self-pay | Admitting: Internal Medicine

## 2019-08-20 ENCOUNTER — Other Ambulatory Visit: Payer: Self-pay

## 2019-08-20 ENCOUNTER — Ambulatory Visit (INDEPENDENT_AMBULATORY_CARE_PROVIDER_SITE_OTHER): Payer: 59 | Admitting: Internal Medicine

## 2019-08-20 ENCOUNTER — Encounter: Payer: Self-pay | Admitting: Internal Medicine

## 2019-08-20 VITALS — BP 122/64 | HR 106 | Temp 98.2°F | Ht 66.5 in | Wt 188.5 lb

## 2019-08-20 DIAGNOSIS — E1140 Type 2 diabetes mellitus with diabetic neuropathy, unspecified: Secondary | ICD-10-CM | POA: Diagnosis not present

## 2019-08-20 DIAGNOSIS — I10 Essential (primary) hypertension: Secondary | ICD-10-CM | POA: Diagnosis not present

## 2019-08-20 DIAGNOSIS — Z0000 Encounter for general adult medical examination without abnormal findings: Secondary | ICD-10-CM

## 2019-08-20 DIAGNOSIS — K219 Gastro-esophageal reflux disease without esophagitis: Secondary | ICD-10-CM

## 2019-08-20 DIAGNOSIS — Z1211 Encounter for screening for malignant neoplasm of colon: Secondary | ICD-10-CM

## 2019-08-20 DIAGNOSIS — G4733 Obstructive sleep apnea (adult) (pediatric): Secondary | ICD-10-CM

## 2019-08-20 LAB — POCT GLYCOSYLATED HEMOGLOBIN (HGB A1C): Hemoglobin A1C: 9.6 % — AB (ref 4.0–5.6)

## 2019-08-20 NOTE — Assessment & Plan Note (Signed)
Sleeps with the CPAP nightly

## 2019-08-20 NOTE — Assessment & Plan Note (Signed)
Better Can cut back on PPI Still burping--not sure why Consider GI if ongoing issues

## 2019-08-20 NOTE — Assessment & Plan Note (Signed)
Has let lifestyle go---needs to rein it back in  Had flu vaccine First COVID already Will do FIT Consider PSA again next time

## 2019-08-20 NOTE — Assessment & Plan Note (Signed)
BP Readings from Last 3 Encounters:  08/20/19 122/64  07/09/19 102/72  02/12/19 110/80   Acceptable control

## 2019-08-20 NOTE — Progress Notes (Signed)
Subjective:    Patient ID: Calvin Smith, male    DOB: 1960/09/08, 59 y.o.   MRN: [id number]  HPI Here for physical  This visit occurred during the SARS-CoV-2 public health emergency.  Safety protocols were in place, including screening questions prior to the visit, additional usage of staff PPE, and extensive cleaning of exam room while observing appropriate contact time as indicated for disinfecting solutions.   Bad couple of months with the diabetes Out of jardiance for about 5 weeks Less exercise for multiple reasons Knows control has not been good lately  Indigestion is better---not waking at night Still with belching though  More pain in left hip Usually bad in cold weather Discussed ortho reevaluation  Did get first COVID vaccine  Current Outpatient Medications on File Prior to Visit  Medication Sig Dispense Refill  . Alcohol Swabs (B-D SINGLE USE SWABS REGULAR) PADS USE AS INSTRUCTED TO TEST  BLOOD SUGAR TWO TIMES A DAY 200 each 4  . aspirin EC 81 MG tablet Take 81 mg by mouth daily.    Marland Kitchen atorvastatin (LIPITOR) 20 MG tablet TAKE 1 TABLET BY MOUTH  DAILY 90 tablet 3  . empagliflozin (JARDIANCE) 25 MG TABS tablet Take 25 mg by mouth daily. 90 tablet 3  . glipiZIDE (GLUCOTROL XL) 5 MG 24 hr tablet TAKE 1 TABLET BY MOUTH TWO  TIMES DAILY 180 tablet 3  . Insulin Glargine (LANTUS SOLOSTAR) 100 UNIT/ML Solostar Pen INJECT 90 UNITS  SUBCUTANEOUSLY AT BEDTIME 30 pen 5  . Insulin Pen Needle (BD PEN NEEDLE NANO U/F) 32G X 4 MM MISC INJECT ONCE A DAY UNDER  SKIN 90 each 3  . JANUVIA 100 MG tablet TAKE 1 TABLET BY MOUTH  DAILY 90 tablet 3  . lisinopril (ZESTRIL) 20 MG tablet TAKE 1 TABLET BY MOUTH  DAILY 90 tablet 3  . metFORMIN (GLUCOPHAGE) 1000 MG tablet TAKE 1 TABLET BY MOUTH  TWICE A DAY WITH MEALS 180 tablet 3  . ONETOUCH DELICA LANCETS 99991111 MISC 1 Units by Does not apply route 2 (two) times daily before a meal. 200 each 4  . ONETOUCH ULTRA test strip USE TO TEST BLOOD SUGAR  TWO TIMES DAILY AS DIRECTED 200 strip 3  . rizatriptan (MAXALT) 10 MG tablet TAKE 1 TABLET BY MOUTH  DAILY AS NEEDED FOR  MIGRAINE. MAY REPEAT IN 2  HOURS IF NEEDED 18 tablet 3  . tadalafil (CIALIS) 20 MG tablet Take 0.5-1 tablets (10-20 mg total) by mouth every other day as needed for erectile dysfunction. 5 tablet 11   No current facility-administered medications on file prior to visit.    Allergies  Allergen Reactions  . Bisoprolol-Hydrochlorothiazide     REACTION: E.D.  . Codeine     REACTION: itch    Past Medical History:  Diagnosis Date  . Allergic rhinitis   . Arm fracture, left as a child  . DeQuervain's disease (tenosynovitis)    right wrist  . Diabetes mellitus    type 2  . Erectile dysfunction   . Hyperlipidemia   . Hypertension   . Migraines   . Sleep apnea     Past Surgical History:  Procedure Laterality Date  . Cardiolite  5/06   Negative EF 66%  . CARPAL TUNNEL RELEASE  9/03   left   . WRIST SURGERY  1/04   DeQuervain release  . WRIST SURGERY  1/08   ORIF l wrist DVR plate screwhead autologous    Family History  Problem Relation Age of Onset  . Hypertension Father   . Lymphoma Father        non-hodgkins  . Uterine cancer Maternal Grandmother   . Hypertension Paternal Grandfather   . Lung cancer Paternal Aunt   . Coronary artery disease Paternal Uncle   . Colon cancer Unknown        in 1 relative (?aunt)    Social History   Socioeconomic History  . Marital status: Married    Spouse name: Not on file  . Number of children: Not on file  . Years of education: Not on file  . Highest education level: Not on file  Occupational History  . 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 Smoking status: Never Smoker  . Smokeless tobacco: Never Used  Substance and Sexual Activity  . Alcohol use: Yes    Comment: rare  . Drug use: No  . Sexual activity: Yes    Partners: Female  Other Topics Concern   . Not on file  Social History Narrative   Regular exercise: light to moderate   Caffeine use: coffee daily   Social Determinants of Health   Financial Resource Strain:   . Difficulty of Paying Living Expenses: Not on file  Food Insecurity:   . Worried About Charity fundraiser in the Last Year: Not on file  . Ran Out of Food in the Last Year: Not on file  Transportation Needs:   . Lack of Transportation (Medical): Not on file  . Lack of Transportation (Non-Medical): Not on file  Physical Activity:   . Days of Exercise per Week: Not on file  . Minutes of Exercise per Session: Not on file  Stress:   . Feeling of Stress : Not on file  Social Connections:   . Frequency of Communication with Friends and Family: Not on file  . Frequency of Social Gatherings with Friends and Family: Not on file  . Attends Religious Services: Not on file  . Active Member of Clubs or Organizations: Not on file  . Attends Archivist Meetings: Not on file  . Marital Status: Not on file  Intimate Partner Violence:   . Fear of Current or Ex-Partner: Not on file  . Emotionally Abused: Not on file  . Physically Abused: Not on file  . Sexually Abused: Not on file   Review of Systems  Constitutional:       Weight up a few pounds Wears seat belt  HENT: Negative for dental problem, hearing loss and tinnitus.        Keeps up with dentist  Eyes: Negative for visual disturbance.       No diplopia or unilateral vision loss  Respiratory: Negative for cough, chest tightness and shortness of breath.   Cardiovascular: Negative for chest pain, palpitations and leg swelling.  Gastrointestinal: Negative for blood in stool and constipation.  Genitourinary: Negative for difficulty urinating and urgency.       Satisfied with cialis--not used in a while  Musculoskeletal: Positive for arthralgias. Negative for back pain and joint swelling.       Left hip  Skin: Negative for rash.  Allergic/Immunologic:  Negative for environmental allergies and immunocompromised state.  Neurological: Positive for headaches. Negative for dizziness, syncope and light-headedness.  Hematological: Negative for adenopathy. Does not bruise/bleed easily.  Psychiatric/Behavioral: Negative for dysphoric mood and sleep disturbance. The patient is nervous/anxious.  Objective:   Physical Exam  Constitutional: He is oriented to person, place, and time. He appears well-developed. No distress.  HENT:  Head: Normocephalic and atraumatic.  Right Ear: External ear normal.  Left Ear: External ear normal.  Mouth/Throat: Oropharynx is clear and moist. No oropharyngeal exudate.  Eyes: Pupils are equal, round, and reactive to light. Conjunctivae are normal.  Neck: No thyromegaly present.  Cardiovascular: Normal rate, regular rhythm, normal heart sounds and intact distal pulses. Exam reveals no gallop.  No murmur heard. Respiratory: Effort normal and breath sounds normal. No respiratory distress. He has no wheezes. He has no rales.  GI: Soft. There is no abdominal tenderness.  Musculoskeletal:        General: No tenderness or edema.  Lymphadenopathy:    He has no cervical adenopathy.  Neurological: He is alert and oriented to person, place, and time.  Skin: No rash noted. No erythema.  Psychiatric: He has a normal mood and affect. His behavior is normal.           Assessment & Plan:

## 2019-08-20 NOTE — Assessment & Plan Note (Addendum)
Knows it is bad now Out of jardiance for a while/lifestyle changes Will check A1c  Lab Results  Component Value Date   HGBA1C 9.6 (A) 08/20/2019   Much worse He is back on the jardiance and will work on lifestyle Will check again in 3 months before adding medication

## 2019-09-13 ENCOUNTER — Other Ambulatory Visit: Payer: Self-pay | Admitting: Internal Medicine

## 2019-11-12 ENCOUNTER — Other Ambulatory Visit: Payer: Self-pay

## 2019-11-18 ENCOUNTER — Other Ambulatory Visit (INDEPENDENT_AMBULATORY_CARE_PROVIDER_SITE_OTHER): Payer: 59

## 2019-11-18 ENCOUNTER — Other Ambulatory Visit: Payer: Self-pay

## 2019-11-18 DIAGNOSIS — E1140 Type 2 diabetes mellitus with diabetic neuropathy, unspecified: Secondary | ICD-10-CM | POA: Diagnosis not present

## 2019-11-19 LAB — HEMOGLOBIN A1C
Est. average glucose Bld gHb Est-mCnc: 200 mg/dL
Hgb A1c MFr Bld: 8.6 % — ABNORMAL HIGH (ref 4.8–5.6)

## 2019-12-12 MED ORDER — CONTOUR NEXT TEST VI STRP: ORAL_STRIP | 12 refills | Status: DC

## 2020-01-23 ENCOUNTER — Other Ambulatory Visit: Payer: Self-pay | Admitting: Internal Medicine

## 2020-01-27 ENCOUNTER — Other Ambulatory Visit: Payer: Self-pay | Admitting: Internal Medicine

## 2020-01-31 ENCOUNTER — Other Ambulatory Visit: Payer: Self-pay | Admitting: Internal Medicine

## 2020-02-08 ENCOUNTER — Other Ambulatory Visit: Payer: Self-pay | Admitting: Internal Medicine

## 2020-02-17 ENCOUNTER — Encounter: Payer: Self-pay | Admitting: Internal Medicine

## 2020-02-17 ENCOUNTER — Other Ambulatory Visit: Payer: Self-pay

## 2020-02-17 ENCOUNTER — Ambulatory Visit: Payer: 59 | Admitting: Internal Medicine

## 2020-02-17 VITALS — BP 120/60 | HR 64 | Temp 98.0°F | Ht 67.0 in | Wt 189.0 lb

## 2020-02-17 DIAGNOSIS — E785 Hyperlipidemia, unspecified: Secondary | ICD-10-CM

## 2020-02-17 DIAGNOSIS — Z125 Encounter for screening for malignant neoplasm of prostate: Secondary | ICD-10-CM

## 2020-02-17 DIAGNOSIS — E1159 Type 2 diabetes mellitus with other circulatory complications: Secondary | ICD-10-CM | POA: Diagnosis not present

## 2020-02-17 DIAGNOSIS — E1140 Type 2 diabetes mellitus with diabetic neuropathy, unspecified: Secondary | ICD-10-CM

## 2020-02-17 DIAGNOSIS — I10 Essential (primary) hypertension: Secondary | ICD-10-CM

## 2020-02-17 DIAGNOSIS — G4733 Obstructive sleep apnea (adult) (pediatric): Secondary | ICD-10-CM

## 2020-02-17 LAB — POCT GLYCOSYLATED HEMOGLOBIN (HGB A1C): Hemoglobin A1C: 8.4 % — AB (ref 4.0–5.6)

## 2020-02-17 LAB — HM DIABETES FOOT EXAM

## 2020-02-17 NOTE — Assessment & Plan Note (Addendum)
Still not well controlled by his measure Result 8.4% today No clear neuropathy now Has HTN also Discussed alternatives--he is ready to see an endocrinologist

## 2020-02-17 NOTE — Progress Notes (Signed)
Subjective:    Patient ID: Calvin Smith, male    DOB: Aug 29, 1960, 59 y.o.   MRN: [id number]  HPI Here for follow up of diabetes and other chronic health conditions This visit occurred during the SARS-CoV-2 public health emergency.  Safety protocols were in place, including screening questions prior to the visit, additional usage of staff PPE, and extensive cleaning of exam room while observing appropriate contact time as indicated for disinfecting solutions.   Checking sugars daily at home-- 30 day average 156 Doesn't seem to be any better He does fairly well with how he is eating  No regular exercise routine--but tries to stay active with yard and walking No problems with feet now---doesn't feel numbness  No chest pain No SOB No dizziness or syncope No edema  Current Outpatient Medications on File Prior to Visit  Medication Sig Dispense Refill  . Alcohol Swabs (B-D SINGLE USE SWABS REGULAR) PADS USE AS INSTRUCTED TO TEST  BLOOD SUGAR TWO TIMES A DAY 200 each 4  . aspirin EC 81 MG tablet Take 81 mg by mouth daily.    Marland Kitchen atorvastatin (LIPITOR) 20 MG tablet TAKE 1 TABLET BY MOUTH  DAILY 90 tablet 3  . glipiZIDE (GLUCOTROL XL) 5 MG 24 hr tablet TAKE 1 TABLET BY MOUTH TWO  TIMES DAILY 180 tablet 3  . glucose blood (CONTOUR NEXT TEST) test strip Use to check blood sugar daily 100 each 12  . insulin glargine (LANTUS SOLOSTAR) 100 UNIT/ML Solostar Pen INJECT SUBCUTANEOUSLY 90  UNITS AT BEDTIME 30 pen 3  . Insulin Pen Needle (BD PEN NEEDLE NANO U/F) 32G X 4 MM MISC INJECT ONCE A DAY UNDER  SKIN 90 each 3  . JANUVIA 100 MG tablet TAKE 1 TABLET BY MOUTH  DAILY 90 tablet 3  . JARDIANCE 25 MG TABS tablet TAKE 25 MG BY MOUTH DAILY 90 tablet 3  . lisinopril (ZESTRIL) 20 MG tablet TAKE 1 TABLET BY MOUTH  DAILY 90 tablet 3  . metFORMIN (GLUCOPHAGE) 1000 MG tablet TAKE 1 TABLET BY MOUTH  TWICE DAILY WITH MEALS 180 tablet 3  . rizatriptan (MAXALT) 10 MG tablet TAKE 1 TABLET BY MOUTH  DAILY AS  NEEDED FOR  MIGRAINE. MAY REPEAT IN 2  HOURS IF NEEDED 18 tablet 3  . sildenafil (REVATIO) 20 MG tablet TAKE 3 TO 5 TABLETS DAILY AS NEEDED 50 tablet 3  . tadalafil (CIALIS) 20 MG tablet Take 0.5-1 tablets (10-20 mg total) by mouth every other day as needed for erectile dysfunction. (Patient not taking: Reported on 02/17/2020) 5 tablet 11   No current facility-administered medications on file prior to visit.    Allergies  Allergen Reactions  . Bisoprolol-Hydrochlorothiazide     REACTION: E.D.  . Codeine     REACTION: itch    Past Medical History:  Diagnosis Date  . Allergic rhinitis   . Arm fracture, left as a child  . DeQuervain's disease (tenosynovitis)    right wrist  . Diabetes mellitus    type 2  . Erectile dysfunction   . Hyperlipidemia   . Hypertension   . Migraines   . Sleep apnea     Past Surgical History:  Procedure Laterality Date  . Cardiolite  5/06   Negative EF 66%  . CARPAL TUNNEL RELEASE  9/03   left   . WRIST SURGERY  1/04   DeQuervain release  . WRIST SURGERY  1/08   ORIF l wrist DVR plate screwhead autologous  Family History  Problem Relation Age of Onset  . Hypertension Father   . Lymphoma Father        non-hodgkins  . Uterine cancer Maternal Grandmother   . Hypertension Paternal Grandfather   . Lung cancer Paternal Aunt   . Coronary artery disease Paternal Uncle   . Colon cancer Unknown        in 1 relative (?aunt)    Social History   Socioeconomic History  . Marital status: Married    Spouse name: Not on file  . Number of children: Not on file  . Years of education: Not on file  . Highest education level: Not on file  Occupational History  . 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 Smoking status: Never Smoker  . Smokeless tobacco: Never Used  Vaping Use  . Vaping Use: Never used  Substance and Sexual Activity  . Alcohol use: Yes    Comment: rare  . Drug use: No  .  Sexual activity: Yes    Partners: Female  Other Topics Concern  . Not on file  Social History Narrative   Regular exercise: light to moderate   Caffeine use: coffee daily   Social Determinants of Health   Financial Resource Strain:   . Difficulty of Paying Living Expenses:   Food Insecurity:   . Worried About Charity fundraiser in the Last Year:   . Arboriculturist in the Last Year:   Transportation Needs:   . Film/video editor (Medical):   Marland Kitchen Lack of Transportation (Non-Medical):   Physical Activity:   . Days of Exercise per Week:   . Minutes of Exercise per Session:   Stress:   . Feeling of Stress :   Social Connections:   . Frequency of Communication with Friends and Family:   . Frequency of Social Gatherings with Friends and Family:   . Attends Religious Services:   . Active Member of Clubs or Organizations:   . Attends Archivist Meetings:   Marland Kitchen Marital Status:   Intimate Partner Violence:   . Fear of Current or Ex-Partner:   . Emotionally Abused:   Marland Kitchen Physically Abused:   . Sexually Abused:    Review of Systems Sleeping well Weight stable    Objective:   Physical Exam Constitutional:      Appearance: Normal appearance.  Cardiovascular:     Rate and Rhythm: Normal rate and regular rhythm.     Pulses: Normal pulses.     Heart sounds: No murmur heard.  No gallop.   Pulmonary:     Effort: Pulmonary effort is normal.     Breath sounds: Normal breath sounds. No wheezing or rales.  Musculoskeletal:     Cervical back: Neck supple.     Right lower leg: No edema.     Left lower leg: No edema.  Lymphadenopathy:     Cervical: No cervical adenopathy.  Skin:    Comments: No foot lesions  Neurological:     Mental Status: He is alert.     Comments: Normal sensation in feet  Psychiatric:        Mood and Affect: Mood normal.        Behavior: Behavior normal.            Assessment & Plan:

## 2020-02-17 NOTE — Assessment & Plan Note (Signed)
Tolerating the statin Due for labs

## 2020-02-17 NOTE — Assessment & Plan Note (Signed)
BP Readings from Last 3 Encounters:  02/17/20 120/60  08/20/19 122/64  07/09/19 102/72   Good control on the lisinopril Due for labs

## 2020-02-17 NOTE — Assessment & Plan Note (Signed)
Sleeps nightly with the CPAP This works well and he is not sleepy in the daytime

## 2020-02-18 LAB — COMPREHENSIVE METABOLIC PANEL
ALT: 40 IU/L (ref 0–44)
AST: 31 IU/L (ref 0–40)
Albumin/Globulin Ratio: 1.7 (ref 1.2–2.2)
Albumin: 4.5 g/dL (ref 3.8–4.9)
Alkaline Phosphatase: 77 IU/L (ref 48–121)
BUN/Creatinine Ratio: 15 (ref 9–20)
BUN: 18 mg/dL (ref 6–24)
Bilirubin Total: 0.8 mg/dL (ref 0.0–1.2)
CO2: 23 mmol/L (ref 20–29)
Calcium: 9.4 mg/dL (ref 8.7–10.2)
Chloride: 100 mmol/L (ref 96–106)
Creatinine, Ser: 1.23 mg/dL (ref 0.76–1.27)
GFR calc Af Amer: 74 mL/min/{1.73_m2} (ref >59)
GFR calc non Af Amer: 64 mL/min/{1.73_m2} (ref >59)
Globulin, Total: 2.7 g/dL (ref 1.5–4.5)
Glucose: 137 mg/dL — ABNORMAL HIGH (ref 65–99)
Potassium: 4.9 mmol/L (ref 3.5–5.2)
Sodium: 139 mmol/L (ref 134–144)
Total Protein: 7.2 g/dL (ref 6.0–8.5)

## 2020-02-18 LAB — LIPID PANEL
Chol/HDL Ratio: 4 ratio (ref 0.0–5.0)
Cholesterol, Total: 160 mg/dL (ref 100–199)
HDL: 40 mg/dL (ref >39)
LDL Chol Calc (NIH): 94 mg/dL (ref 0–99)
Triglycerides: 147 mg/dL (ref 0–149)
VLDL Cholesterol Cal: 26 mg/dL (ref 5–40)

## 2020-02-18 LAB — CBC
Hematocrit: 46.6 % (ref 37.5–51.0)
Hemoglobin: 16 g/dL (ref 13.0–17.7)
MCH: 32.3 pg (ref 26.6–33.0)
MCHC: 34.3 g/dL (ref 31.5–35.7)
MCV: 94 fL (ref 79–97)
Platelets: 203 10*3/uL (ref 150–450)
RBC: 4.95 x10E6/uL (ref 4.14–5.80)
RDW: 12.7 % (ref 11.6–15.4)
WBC: 8.4 10*3/uL (ref 3.4–10.8)

## 2020-02-18 LAB — PSA: Prostate Specific Ag, Serum: 0.5 ng/mL (ref 0.0–4.0)

## 2020-03-28 ENCOUNTER — Other Ambulatory Visit: Payer: Self-pay | Admitting: Internal Medicine

## 2020-06-07 ENCOUNTER — Other Ambulatory Visit: Payer: Self-pay | Admitting: Internal Medicine

## 2020-07-14 ENCOUNTER — Encounter: Payer: Self-pay | Admitting: Family Medicine

## 2020-07-14 ENCOUNTER — Ambulatory Visit: Payer: 59 | Admitting: Family Medicine

## 2020-07-14 ENCOUNTER — Other Ambulatory Visit: Payer: Self-pay

## 2020-07-14 VITALS — BP 110/70 | HR 96 | Temp 98.1°F | Ht 67.0 in | Wt 186.2 lb

## 2020-07-14 DIAGNOSIS — M25512 Pain in left shoulder: Secondary | ICD-10-CM

## 2020-07-14 DIAGNOSIS — M24812 Other specific joint derangements of left shoulder, not elsewhere classified: Secondary | ICD-10-CM | POA: Diagnosis not present

## 2020-07-14 NOTE — Progress Notes (Signed)
Calvin Graley T. Tashe Purdon, MD, [place] at [facility] [place], [address]  Phone: [phone]  FAX: [phone]  Calvin Smith - 59 y.o. male  MRN [id number]  Date of Birth: 1961-06-26  Date: 07/14/2020  PCP: [name], MD  Referral: [name], MD  Chief Complaint  Patient presents with  . Shoulder Pain    Left-Hurt on Saturday    This visit occurred during the SARS-CoV-2 public health emergency.  Safety protocols were in place, including screening questions prior to the visit, additional usage of staff PPE, and extensive cleaning of exam room while observing appropriate contact time as indicated for disinfecting solutions.   Subjective:   Calvin Smith is a 59 y.o. very pleasant male patient with Body mass index is 29.17 kg/m. who presents with the following:  L shoulder pain, hurt on Saturday 5 days ago.  On Saturday, he was moving a very large Christmas wreath, approximately 5 feet tall.  And while he was lifting this he felt a acute pain in his left shoulder.  He also felt this later in the day when he was doing other things with the left arm.  At this point he is having pain with abduction and flexion.  He has have decrease in motion as well.  Does not have any prior significant shoulder injuries, fractures, or dislocations.  He denies significant neck pain, he denies shoulder blade pain.  She having anterior and caudal shoulder pain.  He does not have any history of dislocations or instability.  He does not do any work that could potentially aggravate his shoulder, and he does work in the [employer]iat[employer].,  Review of Systems is noted in the HPI, as appropriate   Objective:   BP 110/70   Pulse 96   Temp 98.1 F (36.7 C) (Temporal)   Ht 5\' 7"  (1.702 m)   Wt 186 lb 4 oz (84.5 kg)   SpO2 96%   BMI 29.17 kg/m   Left shoulder: He does have pain  to abduction to approximately 1 10-1 15 and is guarding somewhat. He does have tenderness at the [facility] joint and the bicipital groove.  Nontender throughout entirety of the clavicle and humerus.  Full range of motion at the elbow.  Strength testing is 4+ out of 5 throughout.  Drop test is negative. He is able to actively abduct to approximately 110 degrees, and I did not attempt to move further secondary to pain.  Grip is fully intact 5/5.  He is entirely neurovascularly intact in the upper extremities.  There is no palpable defect in the biceps.  Radiology: No results found.  Assessment and Plan:     ICD-10-CM   1. Acute pain of left shoulder  M25.512   2. Internal derangement of left shoulder  M24.812    New injury.  My suspicion is that he has a partial-thickness rotator cuff tear plus or minus injury to the biceps tendon.  There does not appear to be any retraction.  We talked about different treatment options, and at this point is difficult to know for sure.  I will recheck him in 3 to 4 weeks.  For now start with moon nonoperative shoulder protocol.  Social: right now his ability to exercise is limited  Follow-up: Return in about 1 month (around 08/14/2020).  Signed,  [name]. Zarina Pe, MD   Outpatient Encounter Medications as  of 07/14/2020  Medication Sig  . Alcohol Swabs (B-D SINGLE USE SWABS REGULAR) PADS USE AS INSTRUCTED TO TEST  BLOOD SUGAR TWO TIMES A DAY  . aspirin EC 81 MG tablet Take 81 mg by mouth daily.  Marland Kitchen atorvastatin (LIPITOR) 20 MG tablet TAKE 1 TABLET BY MOUTH  DAILY  . glipiZIDE (GLUCOTROL XL) 5 MG 24 hr tablet TAKE 1 TABLET BY MOUTH  TWICE DAILY  . glucose blood (CONTOUR NEXT TEST) test strip Use to check blood sugar daily  . insulin glargine (LANTUS SOLOSTAR) 100 UNIT/ML Solostar Pen INJECT SUBCUTANEOUSLY 90  UNITS AT BEDTIME  . Insulin Pen Needle (BD PEN NEEDLE NANO U/F) 32G X 4 MM MISC USE ONCE A DAY  . JANUVIA 100 MG tablet TAKE 1 TABLET BY  MOUTH  DAILY  . JARDIANCE 25 MG TABS tablet TAKE 25 MG BY MOUTH DAILY  . lisinopril (ZESTRIL) 20 MG tablet TAKE 1 TABLET BY MOUTH  DAILY  . metFORMIN (GLUCOPHAGE) 1000 MG tablet TAKE 1 TABLET BY MOUTH  TWICE DAILY WITH MEALS  . rizatriptan (MAXALT) 10 MG tablet TAKE 1 TABLET BY MOUTH  DAILY AS NEEDED FOR  MIGRAINE. MAY REPEAT IN 2  HOURS IF NEEDED  . Semaglutide,0.25 or 0.5MG /DOS, 2 MG/1.5ML SOPN Inject into the skin.  . sildenafil (REVATIO) 20 MG tablet TAKE 3 TO 5 TABLETS DAILY AS NEEDED  . tadalafil (CIALIS) 20 MG tablet Take 0.5-1 tablets (10-20 mg total) by mouth every other day as needed for erectile dysfunction.  . triamcinolone (KENALOG) 0.1 % Apply topically 2 (two) times daily.   No facility-administered encounter medications on file as of 07/14/2020.

## 2020-08-15 NOTE — Progress Notes (Signed)
Haja Crego T. Ala Kratz, MD, [place] at [facility] [place], [address]  Phone: [phone]   FAX: [place] - 60 y.o. male   MRN [id number]   Date of Birth: 1961-06-14  Date: 08/16/2020   PCP: [name], MD   Referral: [name], MD  Chief Complaint  Patient presents with   Follow-up    Left Shoulder    This visit occurred during the SARS-CoV-2 public health emergency.  Safety protocols were in place, including screening questions prior to the visit, additional usage of staff PPE, and extensive cleaning of exam room while observing appropriate contact time as indicated for disinfecting solutions.   Subjective:   Calvin Smith is a 60 y.o. very pleasant male patient with Body mass index is 29.37 kg/m. who presents with the following:  F/u L shoulder.   Movement has gotten better and less pain.  ROM is back to normal and only very mild pain.  I did have him start the [facility] protocol at his last OV.  07/14/2020 Last OV with Owens Loffler, MD  L shoulder pain, hurt on Saturday 5 days ago.  On Saturday, he was moving a very large Christmas wreath, approximately 5 feet tall.  And while he was lifting this he felt a acute pain in his left shoulder.  He also felt this later in the day when he was doing other things with the left arm.  At this point he is having pain with abduction and flexion.  He has have decrease in motion as well.  Does not have any prior significant shoulder injuries, fractures, or dislocations.  He denies significant neck pain, he denies shoulder blade pain.  She having anterior and caudal shoulder pain.  He does not have any history of dislocations or instability.  He does not do any work that could potentially aggravate his shoulder, and he does work in the Psychiatrist.,  Review of Systems is noted in the HPI, as  appropriate   Objective:   BP 99/63    Pulse (!) 103    Temp 98.1 F (36.7 C) (Temporal)    Ht 5\' 7"  (1.702 m)    Wt 187 lb 8 oz (85 kg)    SpO2 95%    BMI 29.37 kg/m    Shoulder: L Inspection: No muscle wasting or winging Ecchymosis/edema: neg  AC joint, scapula, clavicle: NT Cervical spine: NT, full ROM Spurling's: neg Abduction: full, 5/5 Flexion: full, 5/5 IR, full, lift-off: 5/5 ER at neutral: full, 5/5 AC crossover and compression: neg Neer: neg Hawkins: neg Drop Test: neg Empty Can: mild pain Supraspinatus insertion: NT Bicipital groove: NT Speed's: neg Yergason's: neg Sulcus sign: neg Scapular dyskinesis: none C5-T1 intact Sensation intact Grip 5/5   Radiology: No results found.  Assessment and Plan:     ICD-10-CM   1. Internal derangement of left shoulder  M24.812   2. Acute pain of left shoulder  M25.512    He has done dramatically better and is remarkably improved.  I think he can still work on [employer] with basic ROM and strengthening, and he will do fine.  Signed,  [name]. Mackay Hanauer, MD   Outpatient Encounter Medications as of 08/16/2020  Medication Sig   Alcohol Swabs (B-D SINGLE USE SWABS REGULAR) PADS USE AS INSTRUCTED TO TEST  BLOOD SUGAR TWO TIMES A DAY   aspirin EC  81 MG tablet Take 81 mg by mouth daily.   atorvastatin (LIPITOR) 20 MG tablet TAKE 1 TABLET BY MOUTH  DAILY   glipiZIDE (GLUCOTROL XL) 5 MG 24 hr tablet TAKE 1 TABLET BY MOUTH  TWICE DAILY   glucose blood (CONTOUR NEXT TEST) test strip Use to check blood sugar daily   insulin glargine (LANTUS SOLOSTAR) 100 UNIT/ML Solostar Pen INJECT SUBCUTANEOUSLY 90  UNITS AT BEDTIME   Insulin Pen Needle (BD PEN NEEDLE NANO U/F) 32G X 4 MM MISC USE ONCE A DAY   JANUVIA 100 MG tablet TAKE 1 TABLET BY MOUTH  DAILY   JARDIANCE 25 MG TABS tablet TAKE 25 MG BY MOUTH DAILY   lisinopril (ZESTRIL) 20 MG tablet TAKE 1 TABLET BY MOUTH  DAILY   metFORMIN (GLUCOPHAGE) 1000 MG tablet TAKE 1  TABLET BY MOUTH  TWICE DAILY WITH MEALS   rizatriptan (MAXALT) 10 MG tablet TAKE 1 TABLET BY MOUTH  DAILY AS NEEDED FOR  MIGRAINE. MAY REPEAT IN 2  HOURS IF NEEDED   Semaglutide, 1 MG/DOSE, (OZEMPIC, 1 MG/DOSE,) 4 MG/3ML SOPN Inject into the skin.   sildenafil (REVATIO) 20 MG tablet TAKE 3 TO 5 TABLETS DAILY AS NEEDED   tadalafil (CIALIS) 20 MG tablet Take 0.5-1 tablets (10-20 mg total) by mouth every other day as needed for erectile dysfunction.   triamcinolone (KENALOG) 0.1 % Apply topically 2 (two) times daily.   No facility-administered encounter medications on file as of 08/16/2020.

## 2020-08-16 ENCOUNTER — Encounter: Payer: Self-pay | Admitting: Family Medicine

## 2020-08-16 ENCOUNTER — Ambulatory Visit: Payer: 59 | Admitting: Family Medicine

## 2020-08-16 ENCOUNTER — Other Ambulatory Visit: Payer: Self-pay

## 2020-08-16 VITALS — BP 99/63 | HR 103 | Temp 98.1°F | Ht 67.0 in | Wt 187.5 lb

## 2020-08-16 DIAGNOSIS — M25512 Pain in left shoulder: Secondary | ICD-10-CM

## 2020-08-16 DIAGNOSIS — M24812 Other specific joint derangements of left shoulder, not elsewhere classified: Secondary | ICD-10-CM | POA: Diagnosis not present

## 2020-08-24 ENCOUNTER — Encounter: Payer: 59 | Admitting: Internal Medicine

## 2020-08-27 ENCOUNTER — Encounter: Payer: 59 | Admitting: Internal Medicine

## 2020-09-10 ENCOUNTER — Other Ambulatory Visit: Payer: Self-pay

## 2020-09-10 ENCOUNTER — Encounter: Payer: Self-pay | Admitting: Internal Medicine

## 2020-09-10 ENCOUNTER — Telehealth: Payer: Self-pay

## 2020-09-10 ENCOUNTER — Ambulatory Visit: Payer: 59 | Admitting: Internal Medicine

## 2020-09-10 VITALS — BP 98/60 | HR 108 | Temp 97.9°F | Ht 66.5 in | Wt 185.0 lb

## 2020-09-10 DIAGNOSIS — Z1211 Encounter for screening for malignant neoplasm of colon: Secondary | ICD-10-CM | POA: Diagnosis not present

## 2020-09-10 DIAGNOSIS — Z23 Encounter for immunization: Secondary | ICD-10-CM | POA: Diagnosis not present

## 2020-09-10 DIAGNOSIS — Z0000 Encounter for general adult medical examination without abnormal findings: Secondary | ICD-10-CM | POA: Diagnosis not present

## 2020-09-10 DIAGNOSIS — I10 Essential (primary) hypertension: Secondary | ICD-10-CM | POA: Diagnosis not present

## 2020-09-10 DIAGNOSIS — E1159 Type 2 diabetes mellitus with other circulatory complications: Secondary | ICD-10-CM | POA: Diagnosis not present

## 2020-09-10 DIAGNOSIS — K2100 Gastro-esophageal reflux disease with esophagitis, without bleeding: Secondary | ICD-10-CM

## 2020-09-10 DIAGNOSIS — Z794 Long term (current) use of insulin: Secondary | ICD-10-CM

## 2020-09-10 LAB — HM DIABETES FOOT EXAM

## 2020-09-10 MED ORDER — OMEPRAZOLE 20 MG PO CPDR: 20.0000 mg | DELAYED_RELEASE_CAPSULE | Freq: Two times a day (BID) | ORAL | 3 refills | Status: DC

## 2020-09-10 NOTE — Addendum Note (Signed)
Addended by: Ellamae Sia on: 09/10/2020 12:10 PM   Modules accepted: Orders

## 2020-09-10 NOTE — Telephone Encounter (Signed)
Mrs Calvin Smith left v/m that pt was just seen by Dr Silvio Pate today and the medication(omeprazole?) that Dr Silvio Pate was going to send in to pharmacy went to [facility] and needs [facility] in [place].request cb when done.

## 2020-09-10 NOTE — Progress Notes (Signed)
Subjective:    Patient ID: Calvin Smith, male    DOB: [birth year], 60 y.o.   MRN: [id number]  HPI Here for physical This visit occurred during the SARS-CoV-2 public health emergency.  Safety protocols were in place, including screening questions prior to the visit, additional usage of staff PPE, and extensive cleaning of exam room while observing appropriate contact time as indicated for disinfecting solutions.   Doing well with endocrinologist On ozempic---tolerating and now average sugars under 120  Still having trouble with indigestion Is taking OTC med (omeprazole?) Symptoms can be all day--and occasionally wakes him at night Uses wedge in bed No dysphagia  Current Outpatient Medications on File Prior to Visit  Medication Sig Dispense Refill  . Alcohol Swabs (B-D SINGLE USE SWABS REGULAR) PADS USE AS INSTRUCTED TO TEST  BLOOD SUGAR TWO TIMES A DAY 200 each 4  . aspirin EC 81 MG tablet Take 81 mg by mouth daily.    Marland Kitchen atorvastatin (LIPITOR) 20 MG tablet TAKE 1 TABLET BY MOUTH  DAILY 90 tablet 3  . glipiZIDE (GLUCOTROL XL) 5 MG 24 hr tablet TAKE 1 TABLET BY MOUTH  TWICE DAILY 180 tablet 3  . glucose blood (CONTOUR NEXT TEST) test strip Use to check blood sugar daily 100 each 12  . insulin glargine (LANTUS SOLOSTAR) 100 UNIT/ML Solostar Pen INJECT SUBCUTANEOUSLY 90  UNITS AT BEDTIME 30 pen 3  . Insulin Pen Needle (BD PEN NEEDLE NANO U/F) 32G X 4 MM MISC USE ONCE A DAY 90 each 3  . JARDIANCE 25 MG TABS tablet TAKE 25 MG BY MOUTH DAILY 90 tablet 3  . lisinopril (ZESTRIL) 20 MG tablet TAKE 1 TABLET BY MOUTH  DAILY 90 tablet 3  . metFORMIN (GLUCOPHAGE) 1000 MG tablet TAKE 1 TABLET BY MOUTH  TWICE DAILY WITH MEALS 180 tablet 3  . rizatriptan (MAXALT) 10 MG tablet TAKE 1 TABLET BY MOUTH  DAILY AS NEEDED FOR  MIGRAINE. MAY REPEAT IN 2  HOURS IF NEEDED 18 tablet 3  . Semaglutide, 1 MG/DOSE, (OZEMPIC, 1 MG/DOSE,) 4 MG/3ML SOPN Inject into the skin.    . tadalafil (CIALIS) 20 MG tablet Take  0.5-1 tablets (10-20 mg total) by mouth every other day as needed for erectile dysfunction. 5 tablet 11  . triamcinolone (KENALOG) 0.1 % Apply topically 2 (two) times daily.     No current facility-administered medications on file prior to visit.    Allergies  Allergen Reactions  . Bisoprolol-Hydrochlorothiazide     REACTION: E.D.  . Codeine     REACTION: itch    Past Medical History:  Diagnosis Date  . Allergic rhinitis   . Arm fracture, left as a child  . DeQuervain's disease (tenosynovitis)    right wrist  . Diabetes mellitus    type 2  . Erectile dysfunction   . Hyperlipidemia   . Hypertension   . Migraines   . Sleep apnea     Past Surgical History:  Procedure Laterality Date  . Cardiolite  5/06   Negative EF 66%  . CARPAL TUNNEL RELEASE  9/03   left   . WRIST SURGERY  1/04   DeQuervain release  . WRIST SURGERY  1/08   ORIF l wrist DVR plate screwhead autologous    Family History  Problem Relation Age of Onset  . Hypertension Father   . Lymphoma Father        non-hodgkins  . Uterine cancer Maternal Grandmother   . Hypertension Paternal Grandfather   .  Lung cancer Paternal Aunt   . Coronary artery disease Paternal Uncle   . Colon cancer Unknown        in 1 relative (?aunt)    Social History   Socioeconomic History  . Marital status: Married    Spouse name: Not on file  . Number of children: Not on file  . Years of education: Not on file  . Highest education level: Not on file  Occupational History  . 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 Smoking status: Never Smoker  . Smokeless tobacco: Never Used  Vaping Use  . Vaping Use: Never used  Substance and Sexual Activity  . Alcohol use: Yes    Comment: rare  . Drug use: No  . Sexual activity: Yes    Partners: Female  Other Topics Concern  . Not on file  Social History Narrative   Regular exercise: light to moderate   Caffeine use: coffee  daily   Social Determinants of Health   Financial Resource Strain: Not on file  Food Insecurity: Not on file  Transportation Needs: Not on file  Physical Activity: Not on file  Stress: Not on file  Social Connections: Not on file  Intimate Partner Violence: Not on file   Review of Systems  Constitutional: Negative for fatigue and unexpected weight change.       Wears seat belt  HENT: Negative for hearing loss and tinnitus.        Recent crown  Eyes: Negative for visual disturbance.       No diplopia or unilateral vision loss  Respiratory: Negative for cough, chest tightness and shortness of breath.   Cardiovascular: Negative for chest pain, palpitations and leg swelling.  Gastrointestinal: Negative for blood in stool and constipation.  Endocrine: Negative for polydipsia and polyuria.  Genitourinary: Negative for difficulty urinating and urgency.  Musculoskeletal: Positive for arthralgias. Negative for back pain and joint swelling.       Some left hip pain at times  Skin: Negative for rash.  Allergic/Immunologic: Negative for environmental allergies and immunocompromised state.  Neurological: Positive for headaches. Negative for dizziness, syncope and light-headedness.  Hematological: Negative for adenopathy. Does not bruise/bleed easily.  Psychiatric/Behavioral: Negative for dysphoric mood. The patient is not nervous/anxious.        Chronic mild sleep problems       Objective:   Physical Exam Constitutional:      Appearance: Normal appearance.  HENT:     Right Ear: Tympanic membrane, ear canal and external ear normal.     Left Ear: Tympanic membrane, ear canal and external ear normal.     Mouth/Throat:     Pharynx: No oropharyngeal exudate or posterior oropharyngeal erythema.  Eyes:     Conjunctiva/sclera: Conjunctivae normal.     Pupils: Pupils are equal, round, and reactive to light.  Cardiovascular:     Rate and Rhythm: Normal rate and regular rhythm.     Pulses:  Normal pulses.     Heart sounds: No murmur heard. No gallop.   Pulmonary:     Effort: Pulmonary effort is normal.     Breath sounds: Normal breath sounds. No wheezing or rales.  Abdominal:     Palpations: Abdomen is soft.     Tenderness: There is no abdominal tenderness.  Musculoskeletal:     Cervical back: Neck supple.     Right lower leg: No edema.     Left  lower leg: No edema.  Lymphadenopathy:     Cervical: No cervical adenopathy.  Skin:    Findings: No rash.     Comments: No foot lesions  Neurological:     General: No focal deficit present.     Mental Status: He is alert and oriented to person, place, and time.     Comments: Normal sensation in feet  Psychiatric:        Mood and Affect: Mood normal.        Behavior: Behavior normal.            Assessment & Plan:

## 2020-09-10 NOTE — Assessment & Plan Note (Signed)
Having increased symptoms Will try BID omeprazole To GI if persists

## 2020-09-10 NOTE — Assessment & Plan Note (Signed)
Healthy Discussed fitness shingrix #1 today Had COVID booster and flu vaccine Defer PSA FIT

## 2020-09-10 NOTE — Patient Instructions (Signed)
Please try the omeprazole twice a day on an empty stomach. If your symptoms are better within a few weeks, continue twice a day for 2-3 months, then you can try to reduce it to daily. If not better, notify me so I can refer you to a GI specialist.

## 2020-09-10 NOTE — Assessment & Plan Note (Signed)
Down to 7.5% since on ozempic Now seeing endocrine

## 2020-09-10 NOTE — Addendum Note (Signed)
Addended by: Pilar Grammes on: 09/10/2020 12:11 PM   Modules accepted: Orders

## 2020-09-10 NOTE — Assessment & Plan Note (Signed)
BP Readings from Last 3 Encounters:  09/10/20 98/60  08/16/20 99/63  07/14/20 110/70   Good control

## 2020-09-10 NOTE — Telephone Encounter (Signed)
Spoke to pt. I have sent the rx to [facility] and I called [facility] to cancel the rx that sent to them.

## 2020-09-20 ENCOUNTER — Telehealth: Payer: Self-pay

## 2020-09-20 NOTE — Telephone Encounter (Signed)
If he had a positive (especially since his wife was positive) --then he is positive. He will need to review with his work about return to work criteria (some places it can be 5 days if there are no or minimal symptoms---but usually with a negative repeat test)

## 2020-09-20 NOTE — Telephone Encounter (Signed)
[place] Night - Client Nonclinical Telephone Record  [facility] Client [place] Night - Client Client Site [place] Physician Viviana Simpler- MD Contact Type Call Who Is Calling Patient / Member / Family / Caregiver Caller Name [place] Phone Number [phone] Patient Name Calvin Smith Patient DOB 1961/01/25 Call Type Message Only Information Provided Reason for Call Request to Schedule Office Appointment Initial Comment Caller states she is wanting to know if the office is doing covid testing today. Additional Comment Caller declined RN triage. Provided caller with office hours from profile. Disp. Time Disposition Final User 09/18/2020 8:11:11 AM General Information Provided Yes Harrington, Lauren Call Closed By: Philis Pique Transaction Date/Time: 09/18/2020 8:07:32 AM (ET)

## 2020-09-20 NOTE — Telephone Encounter (Signed)
Called and spoke with patient who stated that he had a positive at home test and wanted to get re-tested. Patient stated that he is feeling ok and he will be getting re-tested on Thursday through his job. Instructed patient to call back if he has any further questions or concerns. Patient verbalized understanding.

## 2020-09-20 NOTE — Telephone Encounter (Signed)
Spoke to pt. He said he can get the testing done through work to return to work. Does not need anything from [place] at this time.

## 2020-09-22 ENCOUNTER — Telehealth: Payer: Self-pay | Admitting: Internal Medicine

## 2020-09-22 DIAGNOSIS — U071 COVID-19: Secondary | ICD-10-CM

## 2020-09-22 NOTE — Telephone Encounter (Signed)
Patients wife called in stating that her husband tested positive for covid on 09/18/20> she states that he is really sick and is wanting to know if he could be recommended for the infusion due to his chronic health conditions. Please advise. EM

## 2020-09-22 NOTE — Telephone Encounter (Addendum)
I am not sure why I did not see this message earlier. Will send to Dr Silvio Pate. Most likely he does not qualify.

## 2020-09-23 NOTE — Telephone Encounter (Signed)
I spoke to his wife.

## 2020-09-23 NOTE — Telephone Encounter (Signed)
Despite some chronic issues--his age and having been boosted will not get him onto the very limited list. There are oral medications also--but I am waiting for more experience with them before referring to use them

## 2020-09-23 NOTE — Telephone Encounter (Signed)
Spoke to pt's wife. She said she got the infusion this week after the ER Dr referred her. She was given the option of the infusion or the medication. She looked at the [facility] and it says he is eligible.   His symptoms are a cough and headache with head congestion. No fever. Vaccinated and boosted.

## 2020-09-23 NOTE — Addendum Note (Signed)
Addended by: Viviana Simpler I on: 09/23/2020 01:11 PM   Modules accepted: Orders

## 2020-09-23 NOTE — Telephone Encounter (Signed)
Please let him know I sent the referral. They may only hear if he is chosen to get the monoclonal antibody.

## 2020-09-24 ENCOUNTER — Other Ambulatory Visit: Payer: Self-pay | Admitting: Family

## 2020-09-24 ENCOUNTER — Telehealth: Payer: Self-pay

## 2020-09-24 DIAGNOSIS — E785 Hyperlipidemia, unspecified: Secondary | ICD-10-CM

## 2020-09-24 DIAGNOSIS — I10 Essential (primary) hypertension: Secondary | ICD-10-CM

## 2020-09-24 DIAGNOSIS — Z794 Long term (current) use of insulin: Secondary | ICD-10-CM

## 2020-09-24 DIAGNOSIS — U071 COVID-19: Secondary | ICD-10-CM

## 2020-09-24 DIAGNOSIS — G4733 Obstructive sleep apnea (adult) (pediatric): Secondary | ICD-10-CM

## 2020-09-24 NOTE — Telephone Encounter (Signed)
Called to discuss with patient about COVID-19 symptoms and the use of one of the available treatments for those with mild to moderate Covid symptoms and at a high risk of hospitalization.  Pt appears to qualify for outpatient treatment due to co-morbid conditions and/or a member of an at-risk group in accordance with the FDA Emergency Use Authorization.    Symptom onset: 09/18/20 Cough,headache,fatigue, sinus congestion Vaccinated: Yes Booster? Yes Immunocompromised? No Qualifiers: HTN, DM  Pt. Would like to speak with APP.  Marcello Moores

## 2020-09-24 NOTE — Telephone Encounter (Signed)
Follow up RN pre-screen. Mr. Mercer had symptoms started on 09/18/20 and has been experiencing cough, headache, fatigue and sinus congestion. He is vaccinated and boosted. Qualifying factors include Type 2 diabetes, hyperlipidemia, hypertension, BMI 29 and sleep apnea.  I spoke with Mr. Caseres regarding the risks, benefits and potential financial costs associated with treatment with Sotrovimab and wishes to proceed with treatment.   Hello [name],   We contacted you because you were recently diagnosed with COVID-19 and may benefit from a new treatment for mild to moderate disease. This treatment helps reduce the chance of being hospitalized. For some patients with medical conditions that may increase the chances of an infection, the treatment also decreases the risk for serious symptoms related to COVID-19.   The Food and Drug Administration (FDA) approved emergency use of a new drug to treat patients with mild to moderate symptoms who have risk factors that could cause severe symptoms related to COVID-19. This new treatment is a monoclonal antibody. It works by attaching like a magnet to the SARS-CoV2 virus (the virus that causes COVID-19) and stops it from infecting more cells in your body. It does not kill the virus, but it prevents it from spreading throughout your body with the hope that it will decrease your symptoms after it is administered.   This new drug is an intravenous (IV) infusion called Sotrovimab that is given over one 30-minute session in our [facility] outpatient infusion clinic. You will need to stay about 60 minutes after the infusion to ensure you are tolerating it well and to watch for any allergic reaction to the medication. More information will be given to you at the time of your appointment.   Important information:  . The potential side effects: 2-4% of recipients experience nausea, vomiting, diarrhea, dizziness, headaches, itching, worsening fevers or chills for  around 24 hours. . There have been no serious infusion-related reactions. . Of the more than 3,000 patients who received the infusion, only one had an allergic response that ended once the infusion was stopped. This is why we monitor all of our patients closely for 60 minutes after the infusion.  . The COVID-19 vaccine (including boosters) no longer needs to be delayed after this infusion and can be safely administered after you have completed your isolation period.  . The medication itself is free, but your insurance will be charged an infusion fee. The amount you may owe later varies from insurance to insurance. If you do not have insurance, we can put you in touch with our billing department. Please contact your insurance agent to discuss prior to your appointment if you would like further details about billing specific to your policy. The CMS code is: M77  . If you have been tested outside of a [facility], you MUST bring a copy of your positive test with you the morning of your appointment. You may take a photo of this and upload to your MyChart portal,  have the testing facility fax the result to [phone] or email a copy to [email or web address].    You have been scheduled to receive the monoclonal antibody therapy at [place]:  09/25/20 at 10:30am   The address for the infusion clinic site is:   --The GPS address is [address], and the parking is located near the [employer] building where you will see a "COVID19 Infusion" feather banner marking the entrance to parking. (See photos below.)            --  Enter into the 2nd entrance where the "wave, flag banner" is at the road. Turn into this 2nd entrance and immediately turn left or right to park in one of the marked spaces.   --Please stay in your car and call the desk for assistance inside at [phone]. Let us know which space you are in.    --The average time in the department is roughly 90 minutes  to two hours for monoclonal treatment. This includes preparation of the medication, IV start and the required 60-minute monitoring after the infusion.     Should you develop worsening shortness of breath, chest pain or severe breathing problems, please do not wait for this appointment and go to the emergency room for evaluation and treatment instead. You will undergo another oxygen screen before your infusion to ensure this is the best treatment option for you. There is a chance that the best decision may be to send you to the emergency room for evaluation at the time of your appointment.    The day of your visit, you should: Marland Kitchen Get plenty of rest the night before and drink plenty of water. . Eat a light meal/snack before coming and take your medications as prescribed.  . Wear warm, comfortable clothes with a shirt that can roll-up over the elbow (will need IV start).  . Wear a mask.  . Consider bringing an activity to help pass the time.     Regards,  Mauricio Po

## 2020-09-24 NOTE — Progress Notes (Signed)
I connected by phone with Calvin Smith on 09/24/2020 at 12:19 PM to discuss the potential use of a new treatment for mild to moderate COVID-19 viral infection in non-hospitalized patients.  This patient is a 60 y.o. male that meets the FDA criteria for Emergency Use Authorization of COVID monoclonal antibody sotrovimab.  Has a (+) direct SARS-CoV-2 viral test result  Has mild or moderate COVID-19   Is NOT hospitalized due to COVID-19  Is within 10 days of symptom onset  Has at least one of the high risk factor(s) for progression to severe COVID-19 and/or hospitalization as defined in EUA.  Specific high risk criteria : BMI > 25, Chronic Kidney Disease (CKD), Cardiovascular disease or hypertension and Chronic Lung Disease   I have spoken and communicated the following to the patient or parent/caregiver regarding COVID monoclonal antibody treatment:  1. FDA has authorized the emergency use for the treatment of mild to moderate COVID-19 in adults and pediatric patients with positive results of direct SARS-CoV-2 viral testing who are 46 years of age and older weighing at least 40 kg, and who are at high risk for progressing to severe COVID-19 and/or hospitalization.  2. The significant known and potential risks and benefits of COVID monoclonal antibody, and the extent to which such potential risks and benefits are unknown.  3. Information on available alternative treatments and the risks and benefits of those alternatives, including clinical trials.  4. Patients treated with COVID monoclonal antibody should continue to self-isolate and use infection control measures (e.g., wear mask, isolate, social distance, avoid sharing personal items, clean and disinfect "high touch" surfaces, and frequent handwashing) according to CDC guidelines.   5. The patient or parent/caregiver has the option to accept or refuse COVID monoclonal antibody treatment.  After reviewing this information with the  patient, the patient has agreed to receive one of the available covid 19 monoclonal antibodies and will be provided an appropriate fact sheet prior to infusion.   Calvin Po, FNP 09/24/2020 12:19 PM

## 2020-09-25 ENCOUNTER — Ambulatory Visit (HOSPITAL_COMMUNITY)
Admission: RE | Admit: 2020-09-25 | Discharge: 2020-09-25 | Disposition: A | Discharge status: Home / Self Care | Payer: 59 | Source: Ambulatory Visit | Attending: Pulmonary Disease | Admitting: Pulmonary Disease

## 2020-09-25 DIAGNOSIS — I10 Essential (primary) hypertension: Secondary | ICD-10-CM

## 2020-09-25 DIAGNOSIS — U071 COVID-19: Secondary | ICD-10-CM | POA: Diagnosis not present

## 2020-09-25 DIAGNOSIS — Z794 Long term (current) use of insulin: Secondary | ICD-10-CM | POA: Insufficient documentation

## 2020-09-25 DIAGNOSIS — E785 Hyperlipidemia, unspecified: Secondary | ICD-10-CM | POA: Insufficient documentation

## 2020-09-25 DIAGNOSIS — E1159 Type 2 diabetes mellitus with other circulatory complications: Secondary | ICD-10-CM | POA: Insufficient documentation

## 2020-09-25 DIAGNOSIS — G4733 Obstructive sleep apnea (adult) (pediatric): Secondary | ICD-10-CM

## 2020-09-25 MED ORDER — SOTROVIMAB 500 MG/8ML IV SOLN
500.0000 mg | Freq: Once | INTRAVENOUS | Status: AC
  Administered 2020-09-25: 500 mg via INTRAVENOUS

## 2020-09-25 MED ORDER — DIPHENHYDRAMINE HCL 50 MG/ML IJ SOLN: 50.0000 mg | Freq: Once | INTRAMUSCULAR | Status: DC | PRN

## 2020-09-25 MED ORDER — EPINEPHRINE 0.3 MG/0.3ML IJ SOAJ: 0.3000 mg | Freq: Once | INTRAMUSCULAR | Status: DC | PRN

## 2020-09-25 MED ORDER — ALBUTEROL SULFATE HFA 108 (90 BASE) MCG/ACT IN AERS: 2.0000 | INHALATION_SPRAY | Freq: Once | RESPIRATORY_TRACT | Status: DC | PRN

## 2020-09-25 MED ORDER — FAMOTIDINE IN NACL 20-0.9 MG/50ML-% IV SOLN: 20.0000 mg | Freq: Once | INTRAVENOUS | Status: DC | PRN

## 2020-09-25 MED ORDER — SODIUM CHLORIDE 0.9 % IV SOLN: INTRAVENOUS | Status: DC | PRN

## 2020-09-25 MED ORDER — METHYLPREDNISOLONE SODIUM SUCC 125 MG IJ SOLR: 125.0000 mg | Freq: Once | INTRAMUSCULAR | Status: DC | PRN

## 2020-09-25 NOTE — Progress Notes (Signed)
Patient reviewed Fact Sheet for Patients, Parents, and Caregivers for Emergency Use Authorization (EUA) of sotrovimab for the Treatment of Coronavirus. Patient also reviewed and is agreeable to the estimated cost of treatment. Patient is agreeable to proceed.   

## 2020-09-25 NOTE — Discharge Instructions (Signed)

## 2020-09-29 LAB — FECAL OCCULT BLOOD, IMMUNOCHEMICAL

## 2020-09-30 ENCOUNTER — Telehealth: Payer: Self-pay | Admitting: Radiology

## 2020-09-30 NOTE — Telephone Encounter (Signed)
[facility] canceled ifob, received to late to perform test, LVM for patient to contact us for a new specimen. Can be dropped off here to be picked up by [place] courier, daily.

## 2020-10-14 ENCOUNTER — Other Ambulatory Visit: Payer: Self-pay | Admitting: Internal Medicine

## 2020-10-14 DIAGNOSIS — Z1211 Encounter for screening for malignant neoplasm of colon: Secondary | ICD-10-CM

## 2020-10-16 LAB — FECAL OCCULT BLOOD, IMMUNOCHEMICAL: Fecal Occult Bld: NEGATIVE

## 2020-11-16 ENCOUNTER — Other Ambulatory Visit: Payer: Self-pay

## 2020-11-16 ENCOUNTER — Ambulatory Visit (INDEPENDENT_AMBULATORY_CARE_PROVIDER_SITE_OTHER): Payer: 59 | Admitting: *Deleted

## 2020-11-16 DIAGNOSIS — Z23 Encounter for immunization: Secondary | ICD-10-CM | POA: Diagnosis not present

## 2020-11-16 NOTE — Progress Notes (Signed)
Per orders of R. Garnette Gunner, NP injection of Shingrix given by Lauralyn Primes. Patient tolerated injection well.

## 2020-12-13 ENCOUNTER — Other Ambulatory Visit: Payer: Self-pay | Admitting: Internal Medicine

## 2020-12-15 ENCOUNTER — Ambulatory Visit: Payer: 59 | Admitting: Family Medicine

## 2020-12-15 ENCOUNTER — Encounter: Payer: Self-pay | Admitting: Family Medicine

## 2020-12-15 ENCOUNTER — Other Ambulatory Visit: Payer: Self-pay

## 2020-12-15 VITALS — BP 90/60 | HR 98 | Temp 98.4°F | Ht 66.0 in | Wt 183.2 lb

## 2020-12-15 DIAGNOSIS — M65331 Trigger finger, right middle finger: Secondary | ICD-10-CM

## 2020-12-15 MED ORDER — TRIAMCINOLONE ACETONIDE 40 MG/ML IJ SUSP
20.0000 mg | Freq: Once | INTRAMUSCULAR | Status: AC
  Administered 2020-12-15: 20 mg via INTRA_ARTICULAR

## 2020-12-15 NOTE — Progress Notes (Signed)
    [name] Ke, MD, [place] [facility] [place], [address]  Phone: [phone]  FAX: [phone]  Calvin Smith - 60 y.o. male  MRN [id number]  Date of Birth: Jan 23, 1961  Date: 12/15/2020  PCP: [name], MD  Referral: [name], MD  Chief Complaint  Patient presents with  . Trigger Finger    Right Middle Finger    This visit occurred during the SARS-CoV-2 public health emergency.  Safety protocols were in place, including screening questions prior to the visit, additional usage of staff PPE, and extensive cleaning of exam room while observing appropriate contact time as indicated for disinfecting solutions.    Tendon Sheath Injection Procedure Note BRADON FESTER October 01, 1960 Date of procedure: 12/15/2020  Procedure: Tendon Sheath Injection for Trigger Finger, R 3rd Indications: Pain  Procedure Details Verbal consent was obtained. Risks (including potential risk for skin lightening and potential atrophy), benefits and alternatives were discussed. Prepped with Chloraprep and Ethyl Chloride used for anesthesia. Under sterile conditions, patient injected at palmar crease aiming distally with 45 degree angle towards nodule; injected directly into tendon sheath. Medication flowed freely without resistance.  Needle size: 22 gauge 1 1/2 inch Injection: 1/2 cc of Lidocaine 1% and Kenalog 20 mg Medication: 1/2 cc of Kenalog 40 mg (equaling Kenalog 20 mg)   Signed,  [place] Lemaster T. Leliana Kontz, MD     ICD-10-CM   1. Trigger finger, right middle finger  M65.331 triamcinolone acetonide (KENALOG-40) injection 20 mg    Meds ordered this encounter  Medications  . triamcinolone acetonide (KENALOG-40) injection 20 mg

## 2020-12-21 ENCOUNTER — Other Ambulatory Visit: Payer: Self-pay | Admitting: Internal Medicine

## 2021-01-01 ENCOUNTER — Other Ambulatory Visit: Payer: Self-pay | Admitting: Internal Medicine

## 2021-02-01 ENCOUNTER — Other Ambulatory Visit: Payer: Self-pay | Admitting: Internal Medicine

## 2021-02-10 ENCOUNTER — Other Ambulatory Visit: Payer: Self-pay | Admitting: Internal Medicine

## 2021-02-26 ENCOUNTER — Other Ambulatory Visit: Payer: Self-pay | Admitting: Internal Medicine

## 2021-03-10 MED ORDER — SCOPOLAMINE 1 MG/3DAYS TD PT72: 1.0000 | MEDICATED_PATCH | TRANSDERMAL | 1 refills | Status: DC

## 2021-07-05 ENCOUNTER — Encounter: Payer: Self-pay | Admitting: Internal Medicine

## 2021-08-01 ENCOUNTER — Other Ambulatory Visit: Payer: Self-pay | Admitting: Internal Medicine

## 2021-08-04 ENCOUNTER — Encounter: Payer: Self-pay | Admitting: Internal Medicine

## 2021-08-04 ENCOUNTER — Other Ambulatory Visit: Payer: Self-pay | Admitting: Internal Medicine

## 2021-08-04 MED ORDER — EMPAGLIFLOZIN 25 MG PO TABS: 25.0000 mg | ORAL_TABLET | Freq: Every day | ORAL | 3 refills | Status: DC

## 2021-09-02 ENCOUNTER — Telehealth: Payer: Self-pay

## 2021-09-02 MED ORDER — EMPAGLIFLOZIN 25 MG PO TABS: 25.0000 mg | ORAL_TABLET | Freq: Every day | ORAL | 3 refills | Status: DC

## 2021-09-02 NOTE — Telephone Encounter (Signed)
Rx sent electronically.  

## 2021-09-16 ENCOUNTER — Encounter: Payer: 59 | Admitting: Internal Medicine

## 2021-10-03 ENCOUNTER — Other Ambulatory Visit: Payer: Self-pay | Admitting: Internal Medicine

## 2021-10-12 ENCOUNTER — Other Ambulatory Visit: Payer: Self-pay

## 2021-10-12 ENCOUNTER — Ambulatory Visit: Payer: 59 | Admitting: Family Medicine

## 2021-10-12 ENCOUNTER — Encounter: Payer: Self-pay | Admitting: Family Medicine

## 2021-10-12 VITALS — BP 95/44 | HR 98 | Temp 98.3°F | Ht 66.0 in | Wt 184.3 lb

## 2021-10-12 DIAGNOSIS — M65331 Trigger finger, right middle finger: Secondary | ICD-10-CM

## 2021-10-12 MED ORDER — TRIAMCINOLONE ACETONIDE 40 MG/ML IJ SUSP
20.0000 mg | Freq: Once | INTRAMUSCULAR | Status: AC
  Administered 2021-10-12: 20 mg via INTRA_ARTICULAR

## 2021-10-12 NOTE — Progress Notes (Signed)
? ? ?  Calvin Smith. Calvin Buras, MD, [facility] ?Therapist, music at [facility] ?[address] ?[place], [address] ? ?Phone: [phone]  FAX: [phone] ? ?Calvin Smith - 61 y.o. male  MRN [id number]  Date of Birth: 03-Jan-1961 ? ?Date: 10/12/2021  PCP: [name], MD  Referral: [name], MD ? ?Chief Complaint  ?Patient presents with  ? Trigger Finger  ?  Right Middle Finger  ? ? ?This visit occurred during the SARS-CoV-2 public health emergency.  Safety protocols were in place, including screening questions prior to the visit, additional usage of staff PPE, and extensive cleaning of exam room while observing appropriate contact time as indicated for disinfecting solutions.  ? ?Subjective:  ? ?Calvin Smith is a 61 y.o. very pleasant male patient with Body mass index is 29.75 kg/m?. who presents with the following: ? ?Tendon Sheath Injection Procedure Note ?Calvin Smith ?[birth year] ?Date of procedure: 10/12/2021 ? ?Procedure: Tendon Sheath Injection for Trigger Finger, R 3rd ?Indications: Pain ? ?Procedure Details ?Verbal consent was obtained. Risks (including potential risk for skin lightening and potential atrophy), benefits and alternatives were discussed. Prepped with Chloraprep and Ethyl Chloride used for anesthesia. Under sterile conditions, patient injected at palmar crease aiming distally with 45 degree angle towards nodule; injected directly into tendon sheath. Medication flowed freely without resistance.  ?Needle size: 22 gauge 1 1/2 inch ?Injection: 1/2 cc of Lidocaine 1% and Kenalog 20 mg ?Medication: 1/2 cc of Kenalog 40 mg (equaling Kenalog 20 mg) ? ?  ICD-10-CM   ?1. Trigger finger, right middle finger  M65.331 triamcinolone acetonide (KENALOG-40) injection 20 mg  ?  ? ?We also talked some about general hip arthritis, and I encouraged him to stay physically active, work on some strength training.  He already is doing well above 10,000 steps a  day. ? ?Meds ordered this encounter  ?Medications  ? triamcinolone acetonide (KENALOG-40) injection 20 mg  ? ?Medications Discontinued During This Encounter  ?Medication Reason  ? Semaglutide, 1 MG/DOSE, (OZEMPIC, 1 MG/DOSE,) 4 MG/3ML SOPN Change in therapy  ? ?  ? ?

## 2021-10-15 ENCOUNTER — Other Ambulatory Visit: Payer: Self-pay | Admitting: Internal Medicine

## 2021-10-17 ENCOUNTER — Other Ambulatory Visit: Payer: Self-pay

## 2021-10-18 NOTE — Telephone Encounter (Signed)
I denied the refill with a statement that pt should contact provider first. ?

## 2021-11-01 ENCOUNTER — Other Ambulatory Visit: Payer: Self-pay

## 2021-11-01 ENCOUNTER — Encounter: Payer: Self-pay | Admitting: Internal Medicine

## 2021-11-01 ENCOUNTER — Ambulatory Visit (INDEPENDENT_AMBULATORY_CARE_PROVIDER_SITE_OTHER): Payer: 59 | Admitting: Internal Medicine

## 2021-11-01 VITALS — BP 102/64 | HR 85 | Ht 67.0 in | Wt 184.4 lb

## 2021-11-01 DIAGNOSIS — E1159 Type 2 diabetes mellitus with other circulatory complications: Secondary | ICD-10-CM

## 2021-11-01 DIAGNOSIS — Z794 Long term (current) use of insulin: Secondary | ICD-10-CM | POA: Diagnosis not present

## 2021-11-01 DIAGNOSIS — Z0000 Encounter for general adult medical examination without abnormal findings: Secondary | ICD-10-CM | POA: Diagnosis not present

## 2021-11-01 DIAGNOSIS — I10 Essential (primary) hypertension: Secondary | ICD-10-CM

## 2021-11-01 DIAGNOSIS — K219 Gastro-esophageal reflux disease without esophagitis: Secondary | ICD-10-CM

## 2021-11-01 DIAGNOSIS — Z125 Encounter for screening for malignant neoplasm of prostate: Secondary | ICD-10-CM

## 2021-11-01 DIAGNOSIS — N401 Enlarged prostate with lower urinary tract symptoms: Secondary | ICD-10-CM

## 2021-11-01 DIAGNOSIS — N400 Enlarged prostate without lower urinary tract symptoms: Secondary | ICD-10-CM | POA: Insufficient documentation

## 2021-11-01 DIAGNOSIS — M25552 Pain in left hip: Secondary | ICD-10-CM | POA: Insufficient documentation

## 2021-11-01 DIAGNOSIS — Z1211 Encounter for screening for malignant neoplasm of colon: Secondary | ICD-10-CM

## 2021-11-01 LAB — POCT GLYCOSYLATED HEMOGLOBIN (HGB A1C): Hemoglobin A1C: 6.8 % — AB (ref 4.0–5.6)

## 2021-11-01 LAB — HM DIABETES FOOT EXAM

## 2021-11-01 MED ORDER — LANTUS SOLOSTAR 100 UNIT/ML ~~LOC~~ SOPN: 45.0000 [IU] | PEN_INJECTOR | Freq: Two times a day (BID) | SUBCUTANEOUS | 0 refills | Status: DC

## 2021-11-01 NOTE — Assessment & Plan Note (Signed)
Exam shows limited internal rotation bilaterally L>R ?No bursa tenderness ? ?Likely early OA ?Is better now ?Discussed diclofenac gel ?To ortho if worsens a lot again ?

## 2021-11-01 NOTE — Assessment & Plan Note (Signed)
Healthy ?Discussed increasing exercise ?FIT ?Will check PSA after discussion ?Flu vaccine in the fall--and probably COVID booster ?

## 2021-11-01 NOTE — Assessment & Plan Note (Signed)
Mild symptoms ?If worsens would start tamsulosin 0.'4mg'$  daily ?

## 2021-11-01 NOTE — Assessment & Plan Note (Signed)
Lab Results  ?Component Value Date  ? HGBA1C 6.8 (A) 11/01/2021  ? ?Doing well on the lantus 45 bid, semaglutide '2mg'$  weekly, jardiance 25, metformin 500 bid and glipizide 5 bid ?

## 2021-11-01 NOTE — Progress Notes (Signed)
? ?Subjective:  ? ? Patient ID: Calvin Smith, male    DOB: 1961-04-11, 61 y.o.   MRN: [id number] ? ?HPI ?Here for physical ? ?Doing well with the diabetes---recent A1c 7.1% ?Only lost a little weight with the semaglutide ?Does do some walking but limited ?Did join gym--but hasn't gotten chance to go (stress with wife having medical issues) ?No foot numbness, tingling, burning ? ?Left hip "giving my fits" recently ?Saw Dr Joseph Berkshire ibuprofen sporadically ?Almost had to miss work--hard even walking in from car ?Better now ? ?Current Outpatient Medications on File Prior to Visit  ?Medication Sig Dispense Refill  ? Alcohol Swabs (B-D SINGLE USE SWABS REGULAR) PADS USE AS INSTRUCTED TO TEST  BLOOD SUGAR TWO TIMES A DAY 200 each 4  ? aspirin EC 81 MG tablet Take 81 mg by mouth daily.    ? atorvastatin (LIPITOR) 20 MG tablet TAKE 1 TABLET BY MOUTH  DAILY 90 tablet 3  ? BD PEN NEEDLE NANO U/F 32G X 4 MM MISC USE ONCE A DAY 90 each 3  ? CONTOUR NEXT TEST test strip USE TO CHECK BLOOD SUGAR  DAILY 100 strip 3  ? empagliflozin (JARDIANCE) 25 MG TABS tablet Take 1 tablet (25 mg total) by mouth daily. 90 tablet 3  ? glipiZIDE (GLUCOTROL XL) 5 MG 24 hr tablet TAKE 1 TABLET BY MOUTH  TWICE DAILY 180 tablet 3  ? IBU 800 MG tablet Take 800 mg by mouth every 8 (eight) hours.    ? lisinopril (ZESTRIL) 20 MG tablet TAKE 1 TABLET BY MOUTH  DAILY 90 tablet 3  ? metFORMIN (GLUCOPHAGE) 1000 MG tablet TAKE 1 TABLET BY MOUTH  TWICE DAILY WITH MEALS 180 tablet 3  ? omeprazole (PRILOSEC) 20 MG capsule TAKE 1 CAPSULE BY MOUTH IN THE MORNING AND AT BEDTIME. 180 capsule 3  ? rizatriptan (MAXALT) 10 MG tablet TAKE ONE TABLET DAILY AS NEEDED FOR MIGRAINE MAY REPEAT IN 2 HOURS IF NEEDED 18 tablet 3  ? Semaglutide, 2 MG/DOSE, 8 MG/3ML SOPN Inject into the skin.    ? tadalafil (CIALIS) 20 MG tablet Take 0.5-1 tablets (10-20 mg total) by mouth every other day as needed for erectile dysfunction. 5 tablet 11  ? triamcinolone (KENALOG) 0.1 % Apply  topically 2 (two) times daily.    ? ?No current facility-administered medications on file prior to visit.  ? ? ?Allergies  ?Allergen Reactions  ? Bisoprolol-Hydrochlorothiazide   ?  REACTION: E.D.  ? Codeine   ?  REACTION: itch  ? ? ?Past Medical History:  ?Diagnosis Date  ? Allergic rhinitis   ? Arm fracture, left as a child  ? DeQuervain's disease (tenosynovitis)   ? right wrist  ? Diabetes mellitus   ? type 2  ? Erectile dysfunction   ? Hyperlipidemia   ? Hypertension   ? Migraines   ? Sleep apnea   ? ? ?Past Surgical History:  ?Procedure Laterality Date  ? Cardiolite  5/06  ? Negative EF 66%  ? CARPAL TUNNEL RELEASE  9/03  ? left   ? WRIST SURGERY  1/04  ? DeQuervain release  ? WRIST SURGERY  1/08  ? ORIF l wrist DVR plate screwhead autologous  ? ? ?Family History  ?Problem Relation Age of Onset  ? Hypertension Father   ? Lymphoma Father   ?     non-hodgkins  ? Uterine cancer Maternal Grandmother   ? Hypertension Paternal Grandfather   ? Lung cancer Paternal Aunt   ?  Coronary artery disease Paternal Uncle   ? Colon cancer Unknown   ?     in 1 relative (?aunt)  ? ? ?Social History  ? ?Socioeconomic History  ? Marital status: Married  ?  Spouse name: Not on file  ? Number of children: Not on file  ? Years of education: Not on file  ? Highest education level: Not on file  ?Occupational History  ? Occupation: SUPERVISOR  ?  Employer: [employer]  ?  Comment: immunoassay dept  ?Tobacco Use  ? Smoking status: Never  ? Smokeless tobacco: Never  ?Vaping Use  ? Vaping Use: Never used  ?Substance and Sexual Activity  ? Alcohol use: Yes  ?  Comment: rare  ? Drug use: No  ? Sexual activity: Yes  ?  Partners: Female  ?Other Topics Concern  ? Not on file  ?Social History Narrative  ? Regular exercise: light to moderate  ? Caffeine use: coffee daily  ? ?Social Determinants of Health  ? ?Financial Resource Strain: Not on file  ?Food Insecurity: Not on file  ?Transportation Needs: Not on file  ?Physical Activity: Not on file   ?Stress: Not on file  ?Social Connections: Not on file  ?Intimate Partner Violence: Not on file  ? ?Review of Systems  ?Constitutional:  Negative for fatigue and unexpected weight change.  ?     Wears seat belt  ?HENT:  Negative for dental problem, hearing loss, tinnitus and trouble swallowing.   ?     Keeps up with dentist  ?Eyes:  Negative for visual disturbance.  ?     Recent eye exam was fine  ?Respiratory:  Negative for cough, chest tightness and shortness of breath.   ?Cardiovascular:  Negative for chest pain, palpitations and leg swelling.  ?Gastrointestinal:  Negative for blood in stool and constipation.  ?     No heartburn on the omeprazole  ?Endocrine: Negative for polydipsia and polyuria.  ?Genitourinary:  Negative for urgency.  ?     Slow stream at times---empties slowly but seems to be fully  ?Musculoskeletal:  Positive for arthralgias. Negative for back pain and joint swelling.  ?Skin:  Negative for rash.  ?     Keeps up with derm  ?Allergic/Immunologic: Positive for environmental allergies. Negative for immunocompromised state.  ?     No meds  ?Neurological:  Negative for dizziness, syncope and headaches.  ?     One lightheaded spell with low BP  ?Hematological:  Negative for adenopathy. Does not bruise/bleed easily.  ?Psychiatric/Behavioral:  Negative for dysphoric mood and sleep disturbance. The patient is not nervous/anxious.   ? ?   ?Objective:  ? Physical Exam ?Constitutional:   ?   Appearance: Normal appearance.  ?HENT:  ?   Mouth/Throat:  ?   Pharynx: No oropharyngeal exudate or posterior oropharyngeal erythema.  ?Eyes:  ?   Conjunctiva/sclera: Conjunctivae normal.  ?   Pupils: Pupils are equal, round, and reactive to light.  ?Cardiovascular:  ?   Rate and Rhythm: Normal rate and regular rhythm.  ?   Pulses: Normal pulses.  ?   Heart sounds: No murmur heard. ?  No gallop.  ?Pulmonary:  ?   Effort: Pulmonary effort is normal.  ?   Breath sounds: Normal breath sounds. No wheezing or rales.   ?Abdominal:  ?   Palpations: Abdomen is soft.  ?   Tenderness: There is no abdominal tenderness.  ?Musculoskeletal:  ?   Cervical back: Neck supple.  ?  Right lower leg: No edema.  ?   Left lower leg: No edema.  ?Lymphadenopathy:  ?   Cervical: No cervical adenopathy.  ?Skin: ?   Findings: No lesion or rash.  ?   Comments: No foot lesions  ?Neurological:  ?   General: No focal deficit present.  ?   Mental Status: He is alert and oriented to person, place, and time.  ?Psychiatric:     ?   Mood and Affect: Mood normal.     ?   Behavior: Behavior normal.  ?  ? ? ? ? ?   ?Assessment & Plan:  ? ?

## 2021-11-01 NOTE — Assessment & Plan Note (Signed)
Quiet on omeprazole 20 ?

## 2021-11-01 NOTE — Assessment & Plan Note (Signed)
Good control on lisinopril 20 ?

## 2021-11-02 LAB — COMPREHENSIVE METABOLIC PANEL
ALT: 48 IU/L — ABNORMAL HIGH (ref 0–44)
AST: 40 IU/L (ref 0–40)
Albumin/Globulin Ratio: 2.2 (ref 1.2–2.2)
Albumin: 4.6 g/dL (ref 3.8–4.9)
Alkaline Phosphatase: 77 IU/L (ref 44–121)
BUN/Creatinine Ratio: 12 (ref 10–24)
BUN: 13 mg/dL (ref 8–27)
Bilirubin Total: 0.7 mg/dL (ref 0.0–1.2)
CO2: 22 mmol/L (ref 20–29)
Calcium: 9.2 mg/dL (ref 8.6–10.2)
Chloride: 97 mmol/L (ref 96–106)
Creatinine, Ser: 1.08 mg/dL (ref 0.76–1.27)
Globulin, Total: 2.1 g/dL (ref 1.5–4.5)
Glucose: 54 mg/dL — ABNORMAL LOW (ref 70–99)
Potassium: 4.3 mmol/L (ref 3.5–5.2)
Sodium: 138 mmol/L (ref 134–144)
Total Protein: 6.7 g/dL (ref 6.0–8.5)
eGFR: 79 mL/min/{1.73_m2} (ref >59)

## 2021-11-02 LAB — CBC
Hematocrit: 44.1 % (ref 37.5–51.0)
Hemoglobin: 15.5 g/dL (ref 13.0–17.7)
MCH: 32.6 pg (ref 26.6–33.0)
MCHC: 35.1 g/dL (ref 31.5–35.7)
MCV: 93 fL (ref 79–97)
Platelets: 236 10*3/uL (ref 150–450)
RBC: 4.75 x10E6/uL (ref 4.14–5.80)
RDW: 12.7 % (ref 11.6–15.4)
WBC: 7.5 10*3/uL (ref 3.4–10.8)

## 2021-11-02 LAB — LIPID PANEL
Chol/HDL Ratio: 3 ratio (ref 0.0–5.0)
Cholesterol, Total: 131 mg/dL (ref 100–199)
HDL: 43 mg/dL (ref >39)
LDL Chol Calc (NIH): 71 mg/dL (ref 0–99)
Triglycerides: 86 mg/dL (ref 0–149)
VLDL Cholesterol Cal: 17 mg/dL (ref 5–40)

## 2021-11-02 LAB — MICROALBUMIN / CREATININE URINE RATIO
Creatinine, Urine: 139.7 mg/dL
Microalb/Creat Ratio: 17 mg/g creat (ref 0–29)
Microalbumin, Urine: 23.4 ug/mL

## 2021-11-02 LAB — PSA: Prostate Specific Ag, Serum: 0.4 ng/mL (ref 0.0–4.0)

## 2021-11-12 ENCOUNTER — Other Ambulatory Visit: Payer: Self-pay | Admitting: Internal Medicine

## 2022-01-06 ENCOUNTER — Other Ambulatory Visit: Payer: Self-pay | Admitting: Internal Medicine

## 2022-01-07 ENCOUNTER — Other Ambulatory Visit: Payer: Self-pay | Admitting: Internal Medicine

## 2022-01-28 ENCOUNTER — Other Ambulatory Visit: Payer: Self-pay | Admitting: Internal Medicine

## 2022-02-25 ENCOUNTER — Other Ambulatory Visit: Payer: Self-pay | Admitting: Internal Medicine

## 2022-03-21 ENCOUNTER — Encounter: Payer: Self-pay | Admitting: Emergency Medicine

## 2022-03-21 ENCOUNTER — Emergency Department: Payer: 59

## 2022-03-21 ENCOUNTER — Other Ambulatory Visit: Payer: Self-pay

## 2022-03-21 DIAGNOSIS — K219 Gastro-esophageal reflux disease without esophagitis: Secondary | ICD-10-CM | POA: Insufficient documentation

## 2022-03-21 DIAGNOSIS — Z7982 Long term (current) use of aspirin: Secondary | ICD-10-CM | POA: Insufficient documentation

## 2022-03-21 DIAGNOSIS — Z7984 Long term (current) use of oral hypoglycemic drugs: Secondary | ICD-10-CM | POA: Diagnosis not present

## 2022-03-21 DIAGNOSIS — G459 Transient cerebral ischemic attack, unspecified: Secondary | ICD-10-CM | POA: Diagnosis not present

## 2022-03-21 DIAGNOSIS — I639 Cerebral infarction, unspecified: Secondary | ICD-10-CM

## 2022-03-21 DIAGNOSIS — E1159 Type 2 diabetes mellitus with other circulatory complications: Secondary | ICD-10-CM | POA: Insufficient documentation

## 2022-03-21 DIAGNOSIS — R270 Ataxia, unspecified: Secondary | ICD-10-CM | POA: Diagnosis not present

## 2022-03-21 DIAGNOSIS — R42 Dizziness and giddiness: Secondary | ICD-10-CM | POA: Diagnosis present

## 2022-03-21 DIAGNOSIS — I10 Essential (primary) hypertension: Secondary | ICD-10-CM | POA: Diagnosis not present

## 2022-03-21 DIAGNOSIS — Z7985 Long-term (current) use of injectable non-insulin antidiabetic drugs: Secondary | ICD-10-CM | POA: Insufficient documentation

## 2022-03-21 DIAGNOSIS — Z79899 Other long term (current) drug therapy: Secondary | ICD-10-CM | POA: Insufficient documentation

## 2022-03-21 HISTORY — DX: Cerebral infarction, unspecified: I63.9

## 2022-03-21 LAB — CBC
HCT: 41.1 % (ref 39.0–52.0)
Hemoglobin: 14.5 g/dL (ref 13.0–17.0)
MCH: 31.9 pg (ref 26.0–34.0)
MCHC: 35.3 g/dL (ref 30.0–36.0)
MCV: 90.3 fL (ref 80.0–100.0)
Platelets: 204 10*3/uL (ref 150–400)
RBC: 4.55 MIL/uL (ref 4.22–5.81)
RDW: 12.1 % (ref 11.5–15.5)
WBC: 8.2 10*3/uL (ref 4.0–10.5)
nRBC: 0 % (ref 0.0–0.2)

## 2022-03-21 LAB — DIFFERENTIAL
Abs Immature Granulocytes: 0.03 10*3/uL (ref 0.00–0.07)
Basophils Absolute: 0 10*3/uL (ref 0.0–0.1)
Basophils Relative: 1 %
Eosinophils Absolute: 0.1 10*3/uL (ref 0.0–0.5)
Eosinophils Relative: 1 %
Immature Granulocytes: 0 %
Lymphocytes Relative: 26 %
Lymphs Abs: 2.2 10*3/uL (ref 0.7–4.0)
Monocytes Absolute: 0.9 10*3/uL (ref 0.1–1.0)
Monocytes Relative: 11 %
Neutro Abs: 5 10*3/uL (ref 1.7–7.7)
Neutrophils Relative %: 61 %

## 2022-03-21 LAB — COMPREHENSIVE METABOLIC PANEL
ALT: 41 U/L (ref 0–44)
AST: 36 U/L (ref 15–41)
Albumin: 4.3 g/dL (ref 3.5–5.0)
Alkaline Phosphatase: 69 U/L (ref 38–126)
Anion gap: 9 (ref 5–15)
BUN: 15 mg/dL (ref 8–23)
CO2: 24 mmol/L (ref 22–32)
Calcium: 8.8 mg/dL — ABNORMAL LOW (ref 8.9–10.3)
Chloride: 98 mmol/L (ref 98–111)
Creatinine, Ser: 1.02 mg/dL (ref 0.61–1.24)
GFR, Estimated: >60 mL/min (ref >60)
Glucose, Bld: 153 mg/dL — ABNORMAL HIGH (ref 70–99)
Potassium: 4.2 mmol/L (ref 3.5–5.1)
Sodium: 131 mmol/L — ABNORMAL LOW (ref 135–145)
Total Bilirubin: 1.8 mg/dL — ABNORMAL HIGH (ref 0.3–1.2)
Total Protein: 7.4 g/dL (ref 6.5–8.1)

## 2022-03-21 LAB — CBG MONITORING, ED: Glucose-Capillary: 146 mg/dL — ABNORMAL HIGH (ref 70–99)

## 2022-03-21 LAB — TROPONIN I (HIGH SENSITIVITY): Troponin I (High Sensitivity): 6 ng/L (ref <18)

## 2022-03-21 LAB — APTT: aPTT: 28 seconds (ref 24–36)

## 2022-03-21 LAB — PROTIME-INR
INR: 1.1 (ref 0.8–1.2)
Prothrombin Time: 14.2 seconds (ref 11.4–15.2)

## 2022-03-21 LAB — ETHANOL: Alcohol, Ethyl (B): <10 mg/dL (ref <10)

## 2022-03-21 NOTE — ED Triage Notes (Signed)
Pt to ED from home c/o dizziness that started around 1745 today while cleaning the pool.  States felt off balance and stumbling, denies spinning sensation. Denies pain or SOB.  Some nausea without vomiting.  States dizziness mostly resolved about 20 minutes later lying down in the house.  Denies dizziness at this time.  Pt A&Ox4, denies numbness or tingling, equal strong strength bilateral arms and legs, chest rise even and unlabored, skin WNL and in NAD at this time.

## 2022-03-22 ENCOUNTER — Inpatient Hospital Stay (HOSPITAL_COMMUNITY)
Admission: RE | Admit: 2022-03-22 | Discharge: 2022-03-25 | DRG: 039 | Disposition: A | Discharge status: Home / Self Care | Payer: 59 | Source: Other Acute Inpatient Hospital | Attending: Internal Medicine | Admitting: Internal Medicine

## 2022-03-22 ENCOUNTER — Emergency Department: Payer: 59

## 2022-03-22 ENCOUNTER — Observation Stay: Payer: 59

## 2022-03-22 ENCOUNTER — Observation Stay
Admission: EM | Admit: 2022-03-22 | Discharge: 2022-03-22 | Disposition: A | Discharge status: Other Acute Inpatient Hospital | Payer: 59 | Attending: Internal Medicine | Admitting: Internal Medicine

## 2022-03-22 ENCOUNTER — Encounter: Payer: Self-pay | Admitting: Radiology

## 2022-03-22 ENCOUNTER — Observation Stay (HOSPITAL_COMMUNITY): Payer: 59

## 2022-03-22 ENCOUNTER — Encounter (HOSPITAL_COMMUNITY): Payer: Self-pay

## 2022-03-22 DIAGNOSIS — G450 Vertebro-basilar artery syndrome: Secondary | ICD-10-CM | POA: Diagnosis present

## 2022-03-22 DIAGNOSIS — G8929 Other chronic pain: Secondary | ICD-10-CM | POA: Diagnosis present

## 2022-03-22 DIAGNOSIS — Z91199 Patient's noncompliance with other medical treatment and regimen due to unspecified reason: Secondary | ICD-10-CM | POA: Diagnosis not present

## 2022-03-22 DIAGNOSIS — G4733 Obstructive sleep apnea (adult) (pediatric): Secondary | ICD-10-CM | POA: Diagnosis not present

## 2022-03-22 DIAGNOSIS — G43909 Migraine, unspecified, not intractable, without status migrainosus: Secondary | ICD-10-CM | POA: Diagnosis not present

## 2022-03-22 DIAGNOSIS — Z807 Family history of other malignant neoplasms of lymphoid, hematopoietic and related tissues: Secondary | ICD-10-CM | POA: Diagnosis not present

## 2022-03-22 DIAGNOSIS — Z8249 Family history of ischemic heart disease and other diseases of the circulatory system: Secondary | ICD-10-CM | POA: Diagnosis not present

## 2022-03-22 DIAGNOSIS — G459 Transient cerebral ischemic attack, unspecified: Secondary | ICD-10-CM | POA: Diagnosis not present

## 2022-03-22 DIAGNOSIS — Z885 Allergy status to narcotic agent status: Secondary | ICD-10-CM

## 2022-03-22 DIAGNOSIS — I6502 Occlusion and stenosis of left vertebral artery: Secondary | ICD-10-CM | POA: Diagnosis not present

## 2022-03-22 DIAGNOSIS — R61 Generalized hyperhidrosis: Secondary | ICD-10-CM | POA: Diagnosis not present

## 2022-03-22 DIAGNOSIS — K219 Gastro-esophageal reflux disease without esophagitis: Secondary | ICD-10-CM | POA: Diagnosis present

## 2022-03-22 DIAGNOSIS — I6523 Occlusion and stenosis of bilateral carotid arteries: Secondary | ICD-10-CM

## 2022-03-22 DIAGNOSIS — E1159 Type 2 diabetes mellitus with other circulatory complications: Secondary | ICD-10-CM | POA: Diagnosis not present

## 2022-03-22 DIAGNOSIS — R262 Difficulty in walking, not elsewhere classified: Secondary | ICD-10-CM | POA: Diagnosis present

## 2022-03-22 DIAGNOSIS — Z8673 Personal history of transient ischemic attack (TIA), and cerebral infarction without residual deficits: Secondary | ICD-10-CM

## 2022-03-22 DIAGNOSIS — I10 Essential (primary) hypertension: Secondary | ICD-10-CM | POA: Diagnosis present

## 2022-03-22 DIAGNOSIS — Z801 Family history of malignant neoplasm of trachea, bronchus and lung: Secondary | ICD-10-CM | POA: Diagnosis not present

## 2022-03-22 DIAGNOSIS — M542 Cervicalgia: Secondary | ICD-10-CM | POA: Diagnosis not present

## 2022-03-22 DIAGNOSIS — E1165 Type 2 diabetes mellitus with hyperglycemia: Secondary | ICD-10-CM | POA: Diagnosis present

## 2022-03-22 DIAGNOSIS — Z7902 Long term (current) use of antithrombotics/antiplatelets: Secondary | ICD-10-CM | POA: Diagnosis not present

## 2022-03-22 DIAGNOSIS — E785 Hyperlipidemia, unspecified: Secondary | ICD-10-CM | POA: Diagnosis not present

## 2022-03-22 DIAGNOSIS — I63211 Cerebral infarction due to unspecified occlusion or stenosis of right vertebral arteries: Principal | ICD-10-CM

## 2022-03-22 DIAGNOSIS — Z7982 Long term (current) use of aspirin: Secondary | ICD-10-CM | POA: Diagnosis not present

## 2022-03-22 DIAGNOSIS — R270 Ataxia, unspecified: Secondary | ICD-10-CM

## 2022-03-22 DIAGNOSIS — Z794 Long term (current) use of insulin: Secondary | ICD-10-CM

## 2022-03-22 DIAGNOSIS — R260 Ataxic gait: Secondary | ICD-10-CM | POA: Diagnosis present

## 2022-03-22 DIAGNOSIS — Z79899 Other long term (current) drug therapy: Secondary | ICD-10-CM | POA: Diagnosis not present

## 2022-03-22 DIAGNOSIS — Z8049 Family history of malignant neoplasm of other genital organs: Secondary | ICD-10-CM

## 2022-03-22 LAB — COMPREHENSIVE METABOLIC PANEL
ALT: 45 U/L — ABNORMAL HIGH (ref 0–44)
AST: 33 U/L (ref 15–41)
Albumin: 3.6 g/dL (ref 3.5–5.0)
Alkaline Phosphatase: 60 U/L (ref 38–126)
Anion gap: 8 (ref 5–15)
BUN: 11 mg/dL (ref 8–23)
CO2: 23 mmol/L (ref 22–32)
Calcium: 8.5 mg/dL — ABNORMAL LOW (ref 8.9–10.3)
Chloride: 98 mmol/L (ref 98–111)
Creatinine, Ser: 1.03 mg/dL (ref 0.61–1.24)
GFR, Estimated: >60 mL/min (ref >60)
Glucose, Bld: 171 mg/dL — ABNORMAL HIGH (ref 70–99)
Potassium: 4 mmol/L (ref 3.5–5.1)
Sodium: 129 mmol/L — ABNORMAL LOW (ref 135–145)
Total Bilirubin: 1.8 mg/dL — ABNORMAL HIGH (ref 0.3–1.2)
Total Protein: 6.6 g/dL (ref 6.5–8.1)

## 2022-03-22 LAB — HEMOGLOBIN A1C
Hgb A1c MFr Bld: 6.1 % — ABNORMAL HIGH (ref 4.8–5.6)
Mean Plasma Glucose: 128.37 mg/dL

## 2022-03-22 LAB — URINALYSIS, COMPLETE (UACMP) WITH MICROSCOPIC
Bacteria, UA: NONE SEEN
Bilirubin Urine: NEGATIVE
Glucose, UA: >=500 mg/dL — AB
Hgb urine dipstick: NEGATIVE
Ketones, ur: NEGATIVE mg/dL
Leukocytes,Ua: NEGATIVE
Nitrite: NEGATIVE
Protein, ur: NEGATIVE mg/dL
Specific Gravity, Urine: 1.026 (ref 1.005–1.030)
pH: 6 (ref 5.0–8.0)

## 2022-03-22 LAB — CBC WITH DIFFERENTIAL/PLATELET
Abs Immature Granulocytes: 0.03 10*3/uL (ref 0.00–0.07)
Basophils Absolute: 0 10*3/uL (ref 0.0–0.1)
Basophils Relative: 1 %
Eosinophils Absolute: 0.1 10*3/uL (ref 0.0–0.5)
Eosinophils Relative: 1 %
HCT: 40.1 % (ref 39.0–52.0)
Hemoglobin: 14.7 g/dL (ref 13.0–17.0)
Immature Granulocytes: 0 %
Lymphocytes Relative: 29 %
Lymphs Abs: 2 10*3/uL (ref 0.7–4.0)
MCH: 32.7 pg (ref 26.0–34.0)
MCHC: 36.7 g/dL — ABNORMAL HIGH (ref 30.0–36.0)
MCV: 89.1 fL (ref 80.0–100.0)
Monocytes Absolute: 0.8 10*3/uL (ref 0.1–1.0)
Monocytes Relative: 12 %
Neutro Abs: 4 10*3/uL (ref 1.7–7.7)
Neutrophils Relative %: 57 %
Platelets: 209 10*3/uL (ref 150–400)
RBC: 4.5 MIL/uL (ref 4.22–5.81)
RDW: 11.9 % (ref 11.5–15.5)
WBC: 7 10*3/uL (ref 4.0–10.5)
nRBC: 0 % (ref 0.0–0.2)

## 2022-03-22 LAB — CBG MONITORING, ED
Glucose-Capillary: 200 mg/dL — ABNORMAL HIGH (ref 70–99)
Glucose-Capillary: 151 mg/dL — ABNORMAL HIGH (ref 70–99)
Glucose-Capillary: 197 mg/dL — ABNORMAL HIGH (ref 70–99)

## 2022-03-22 LAB — MAGNESIUM: Magnesium: 2 mg/dL (ref 1.7–2.4)

## 2022-03-22 LAB — SODIUM, URINE, RANDOM: Sodium, Ur: 63 mmol/L

## 2022-03-22 LAB — TROPONIN I (HIGH SENSITIVITY): Troponin I (High Sensitivity): 6 ng/L (ref <18)

## 2022-03-22 LAB — OSMOLALITY, URINE: Osmolality, Ur: 630 mOsm/kg (ref 300–900)

## 2022-03-22 LAB — OSMOLALITY: Osmolality: 273 mOsm/kg — ABNORMAL LOW (ref 275–295)

## 2022-03-22 LAB — CREATININE, URINE, RANDOM: Creatinine, Urine: 90 mg/dL

## 2022-03-22 LAB — GLUCOSE, CAPILLARY: Glucose-Capillary: 182 mg/dL — ABNORMAL HIGH (ref 70–99)

## 2022-03-22 MED ORDER — INSULIN GLARGINE-YFGN 100 UNIT/ML ~~LOC~~ SOLN
30.0000 [IU] | Freq: Two times a day (BID) | SUBCUTANEOUS | Status: DC
  Administered 2022-03-22 – 2022-03-25 (×4): 30 [IU] via SUBCUTANEOUS
  Filled 2022-03-22 (×8): qty 0.3

## 2022-03-22 MED ORDER — INSULIN ASPART 100 UNIT/ML IJ SOLN
0.0000 [IU] | INTRAMUSCULAR | Status: DC
  Administered 2022-03-22: 3 [IU] via SUBCUTANEOUS

## 2022-03-22 MED ORDER — ATORVASTATIN CALCIUM 20 MG PO TABS
80.0000 mg | ORAL_TABLET | Freq: Every day | ORAL | Status: DC
Start: 2022-03-22 — End: 2022-03-22
  Administered 2022-03-22: 80 mg via ORAL
  Filled 2022-03-22: qty 4

## 2022-03-22 MED ORDER — SENNOSIDES-DOCUSATE SODIUM 8.6-50 MG PO TABS: 1.0000 | ORAL_TABLET | Freq: Every evening | ORAL | Status: DC | PRN

## 2022-03-22 MED ORDER — ASPIRIN 325 MG PO TABS
325.0000 mg | ORAL_TABLET | Freq: Once | ORAL | Status: AC
  Administered 2022-03-22: 325 mg via ORAL
  Filled 2022-03-22: qty 1

## 2022-03-22 MED ORDER — STROKE: EARLY STAGES OF RECOVERY BOOK: Freq: Once | Status: DC

## 2022-03-22 MED ORDER — ACETAMINOPHEN 325 MG PO TABS
650.0000 mg | ORAL_TABLET | ORAL | Status: DC | PRN
  Administered 2022-03-22: 650 mg via ORAL
  Filled 2022-03-22: qty 2

## 2022-03-22 MED ORDER — INSULIN GLARGINE-YFGN 100 UNIT/ML ~~LOC~~ SOLN
20.0000 [IU] | Freq: Two times a day (BID) | SUBCUTANEOUS | Status: DC
  Administered 2022-03-22: 20 [IU] via SUBCUTANEOUS
  Filled 2022-03-22 (×2): qty 0.2

## 2022-03-22 MED ORDER — ACETAMINOPHEN 650 MG RE SUPP: 650.0000 mg | RECTAL | Status: DC | PRN

## 2022-03-22 MED ORDER — SODIUM CHLORIDE 0.9 % IV SOLN
INTRAVENOUS | Status: DC
Start: 2022-03-22 — End: 2022-03-25

## 2022-03-22 MED ORDER — ASPIRIN 81 MG PO CHEW: 81.0000 mg | CHEWABLE_TABLET | Freq: Every day | ORAL | Status: DC

## 2022-03-22 MED ORDER — INSULIN ASPART 100 UNIT/ML IJ SOLN
0.0000 [IU] | INTRAMUSCULAR | Status: DC
  Administered 2022-03-23 (×2): 1 [IU] via SUBCUTANEOUS
  Administered 2022-03-23: 2 [IU] via SUBCUTANEOUS
  Administered 2022-03-23: 1 [IU] via SUBCUTANEOUS
  Administered 2022-03-24: 2 [IU] via SUBCUTANEOUS
  Administered 2022-03-24: 3 [IU] via SUBCUTANEOUS
  Administered 2022-03-24: 2 [IU] via SUBCUTANEOUS
  Administered 2022-03-24 – 2022-03-25 (×2): 1 [IU] via SUBCUTANEOUS

## 2022-03-22 MED ORDER — ASPIRIN 81 MG PO TBEC
81.0000 mg | DELAYED_RELEASE_TABLET | Freq: Every day | ORAL | Status: DC
  Administered 2022-03-23 – 2022-03-25 (×2): 81 mg via ORAL
  Filled 2022-03-22 (×2): qty 1

## 2022-03-22 MED ORDER — PANTOPRAZOLE SODIUM 40 MG PO TBEC
40.0000 mg | DELAYED_RELEASE_TABLET | Freq: Every day | ORAL | Status: DC
  Administered 2022-03-23 – 2022-03-25 (×2): 40 mg via ORAL
  Filled 2022-03-22 (×2): qty 1

## 2022-03-22 MED ORDER — STROKE: EARLY STAGES OF RECOVERY BOOK
Freq: Once | Status: AC
  Filled 2022-03-22: qty 1

## 2022-03-22 MED ORDER — CLOPIDOGREL BISULFATE 75 MG PO TABS
75.0000 mg | ORAL_TABLET | Freq: Every day | ORAL | Status: DC
  Administered 2022-03-22: 75 mg via ORAL
  Filled 2022-03-22: qty 1

## 2022-03-22 MED ORDER — ACETAMINOPHEN 160 MG/5ML PO SOLN: 650.0000 mg | ORAL | Status: DC | PRN

## 2022-03-22 MED ORDER — ACETAMINOPHEN 325 MG PO TABS
650.0000 mg | ORAL_TABLET | ORAL | Status: DC | PRN
  Administered 2022-03-22 – 2022-03-23 (×3): 650 mg via ORAL
  Filled 2022-03-22 (×3): qty 2

## 2022-03-22 MED ORDER — IOHEXOL 350 MG/ML SOLN
75.0000 mL | Freq: Once | INTRAVENOUS | Status: AC | PRN
Start: 2022-03-22 — End: 2022-03-22
  Administered 2022-03-22: 75 mL via INTRAVENOUS

## 2022-03-22 MED ORDER — CLOPIDOGREL BISULFATE 75 MG PO TABS
75.0000 mg | ORAL_TABLET | Freq: Every day | ORAL | Status: DC
  Administered 2022-03-23: 75 mg via ORAL
  Filled 2022-03-22: qty 1

## 2022-03-22 MED ORDER — ATORVASTATIN CALCIUM 20 MG PO TABS
20.0000 mg | ORAL_TABLET | Freq: Every day | ORAL | Status: DC
Start: 2022-03-22 — End: 2022-03-22

## 2022-03-22 MED ORDER — INSULIN ASPART 100 UNIT/ML IJ SOLN
0.0000 [IU] | Freq: Three times a day (TID) | INTRAMUSCULAR | Status: DC
  Administered 2022-03-22: 3 [IU] via SUBCUTANEOUS
  Filled 2022-03-22 (×2): qty 1

## 2022-03-22 MED ORDER — PANTOPRAZOLE SODIUM 40 MG PO TBEC
40.0000 mg | DELAYED_RELEASE_TABLET | Freq: Every day | ORAL | Status: DC
  Administered 2022-03-22: 40 mg via ORAL
  Filled 2022-03-22: qty 1

## 2022-03-22 MED ORDER — ATORVASTATIN CALCIUM 80 MG PO TABS
80.0000 mg | ORAL_TABLET | Freq: Every day | ORAL | Status: DC
  Administered 2022-03-23 – 2022-03-25 (×2): 80 mg via ORAL
  Filled 2022-03-22 (×2): qty 1

## 2022-03-22 MED ORDER — CLOPIDOGREL BISULFATE 75 MG PO TABS
75.0000 mg | ORAL_TABLET | Freq: Every day | ORAL | 0 refills | Status: AC
Start: 2022-03-23 — End: 2022-04-22

## 2022-03-22 MED ORDER — SUMATRIPTAN SUCCINATE 50 MG PO TABS: 50.0000 mg | ORAL_TABLET | ORAL | Status: DC | PRN

## 2022-03-22 MED ORDER — ATORVASTATIN CALCIUM 80 MG PO TABS: 80.0000 mg | ORAL_TABLET | Freq: Every day | ORAL | 2 refills | Status: DC

## 2022-03-22 NOTE — Progress Notes (Signed)
OT Cancellation Note  Patient Details Name: Calvin Smith MRN: [id number] DOB: 1960/12/27   Cancelled Treatment:    Reason Eval/Treat Not Completed: Other (comment) (per team pt is transferring to [facility], no OT eval at this time.) OT will complete order, please re-consult when pt is medically ready.   Shanon Payor, OTD OTR/L  03/22/22, 12:16 PM

## 2022-03-22 NOTE — Discharge Summary (Signed)
Physician Discharge Summary   Patient: Calvin Smith MRN: [id number] DOB: 1961/02/16  Admit date:     03/22/2022  Discharge date: 03/22/22  Discharge Physician: Rocko Fesperman   PCP: [name], MD   Recommendations at discharge:   Patient is being transferred to [facility] for diagnostic and potentially therapeutic catheter angiogram in a.m.   Discharge Diagnoses: Principal Problem:   TIA (transient ischemic attack) Active Problems:   Type 2 diabetes mellitus with circulatory disorder (HCC)   Essential hypertension, benign   GERD (gastroesophageal reflux disease)  Resolved Problems:   * No resolved hospital problems. [facility] Course: Calvin Smith is a 61 y.o. male with medical history significant for diabetes mellitus, hypertension, dyslipidemia, migraine headaches and sleep apnea who presented to the ER for evaluation of an episode of staggering/unsteady gait which happened suddenly at about 5:30 PM the day prior to his admission while he was cleaning his pool.  Patient states that that he was cleaning leaves out of his pool when he suddenly felt very unsteady on his feet and had difficulty ambulating.  He managed to get into the house without falling and lay down on the couch.  He also complains of nausea, diaphoresis and dizziness.  Symptoms lasted about 20 minutes and resolved. According to his wife she noted that he was diaphoretic and pale but this has improved as well. Patient has never had any symptoms like this in the past. His wife called EMS and patient was evaluated at home but was transported to the ER by his wife. He denies having any chest pain, no vomiting, no headache, no blurred vision, no difficulty swallowing, no changes in his bowel habits, no urinary symptoms, no fever, no chills, no cough. Patient had a CT scan of the head without contrast which showed no acute abnormality. He had a CT angiogram of the head and neck which was negative  for large vessel occlusion, but positive for bulky calcified plaque at both carotid bifurcations and the proximal left vertebral artery:Right ICA origin and bulb RADIOGRAPHIC STRING SIGN. proximal Left Vertebral Artery RADIOGRAPHIC STRING SIGN. Left Carotid Bifurcation High-grade Stenosis, approaching a radiographic string sign. Also moderate Right Vertebral Artery V1 and V4 segment stenoses due to plaque. Aortic Atherosclerosis.  Assessment and Plan: * TIA (transient ischemic attack) Patient presents for evaluation of sudden onset of unsteady gait associated with dizziness and nausea which has resolved. CT scan of the head does not show any acute findings and MRI is negative for an acute stroke. CT angiogram of the head and neck shows significant stenosis. Patient was seen by neurologist who recommends transfer to [facility] for diagnostic and potentially therapeutic catheter angiogram for severe multifocal stenosis involving the extracranial cerebral vasculature. Allow for permissive hypertension Continue aspirin, Plavix and high intensity statins Follow-up results of 2D echocardiogram Stat head CT and activate code stroke for any change in neuro exam  Type 2 diabetes mellitus with circulatory disorder ([place]) Patient has a history of type 2 diabetes mellitus on insulin and multiple oral agents. Hold oral hypoglycemic agents for now Continue long-acting insulin at one half the dose Place patient on a consistent carbohydrate diet once his swallow screen has been done Sliding scale insulin  Essential hypertension, benign Hold lisinopril for now Allow for permissive hypertension  GERD (gastroesophageal reflux disease) Stable Continue PPI         Consultants: Neurology Procedures performed: CT angio of the head and neck, MRI of the  brain Disposition:  Transfer to [facility] Diet recommendation:  Discharge Diet Orders (From admission, onward)     Start      Ordered   03/22/22 0000  Diet - low sodium heart healthy        03/22/22 1204           Carb modified diet DISCHARGE MEDICATION: Allergies as of 03/22/2022       Reactions   Bisoprolol-hydrochlorothiazide    REACTION: E.D.   Codeine    REACTION: itch        Medication List     STOP taking these medications    empagliflozin 25 MG Tabs tablet Commonly known as: Jardiance   glipiZIDE 5 MG 24 hr tablet Commonly known as: GLUCOTROL XL   IBU 800 MG tablet Generic drug: ibuprofen   lisinopril 20 MG tablet Commonly known as: ZESTRIL   metFORMIN 1000 MG tablet Commonly known as: GLUCOPHAGE   rizatriptan 10 MG tablet Commonly known as: MAXALT   Semaglutide (2 MG/DOSE) 8 MG/3ML Sopn   tadalafil 20 MG tablet Commonly known as: CIALIS   triamcinolone cream 0.1 % Commonly known as: KENALOG       TAKE these medications    aspirin EC 81 MG tablet Take 81 mg by mouth daily.   atorvastatin 80 MG tablet Commonly known as: LIPITOR Take 1 tablet (80 mg total) by mouth daily. Start taking on: March 23, 2022 What changed:  medication strength how much to take   clopidogrel 75 MG tablet Commonly known as: PLAVIX Take 1 tablet (75 mg total) by mouth daily. Start taking on: March 23, 2022   Lantus SoloStar 100 UNIT/ML Solostar Pen Generic drug: insulin glargine INJECT SUBCUTANEOUSLY 90  UNITS AT BEDTIME What changed: See the new instructions.   omeprazole 20 MG capsule Commonly known as: PRILOSEC TAKE 1 CAPSULE BY MOUTH IN THE MORNING AND AT BEDTIME.        Discharge Exam: Filed Weights   03/21/22 1926  Weight: 80.3 kg   Vitals and nursing note reviewed.  Constitutional:      Appearance: Normal appearance.  HENT:     Head: Normocephalic and atraumatic.     Nose: Nose normal.     Mouth/Throat:     Mouth: Mucous membranes are moist.  Eyes:     Pupils: Pupils are equal, round, and reactive to light.  Cardiovascular:     Rate and Rhythm:  Normal rate and regular rhythm.  Pulmonary:     Effort: Pulmonary effort is normal.     Breath sounds: Normal breath sounds.  Abdominal:     General: Abdomen is flat. Bowel sounds are normal.     Palpations: Abdomen is soft.  Musculoskeletal:        General: Normal range of motion.     Cervical back: Normal range of motion and neck supple.  Skin:    General: Skin is warm and dry.  Neurological:     Mental Status: He is alert and oriented to person, place, and time.  Able to move all extremities Psychiatric:        Mood and Affect: Mood normal.        Behavior: Behavior normal.   Condition at discharge: stable  The results of significant diagnostics from this hospitalization (including imaging, microbiology, ancillary and laboratory) are listed below for reference.   Imaging Studies: MR BRAIN WO CONTRAST  Result Date: 03/22/2022 CLINICAL DATA:  61 year old male TIA. Dizziness. Unsteady gait. Neurologic deficit.  EXAM: MRI HEAD WITHOUT CONTRAST TECHNIQUE: Multiplanar, multiecho pulse sequences of the brain and surrounding structures were obtained without intravenous contrast. COMPARISON:  CTA head and neck earlier today. FINDINGS: Brain: No restricted diffusion to suggest acute infarction. No midline shift, mass effect, evidence of mass lesion, ventriculomegaly, extra-axial collection or acute intracranial hemorrhage. Cervicomedullary junction and pituitary are within normal limits. Scattered small subcortical white matter T2 and FLAIR hyperintense foci, mild to moderate for age. Occasional periventricular involvement. No cortical encephalomalacia or chronic cerebral blood products identified. Deep gray nuclei, brainstem, and cerebellum are normal for age. Vascular: Major intracranial vascular flow voids are preserved. Skull and upper cervical spine: Negative. Visualized bone marrow signal is within normal limits. Sinuses/Orbits: Negative. Other: Mastoids are clear. Grossly negative visible  internal auditory structures. Normal stylomastoid foramina. Negative visible scalp and face. IMPRESSION: 1. No acute intracranial abnormality. 2. Mild to moderate for age nonspecific cerebral white matter signal changes, most commonly due to chronic small vessel disease. Electronically Signed   By: Genevie Ann M.D.   On: 03/22/2022 09:19   CT ANGIO HEAD NECK W WO CM  Result Date: 03/22/2022 CLINICAL DATA:  61 year old male TIA.  Dizziness. EXAM: CT ANGIOGRAPHY HEAD AND NECK TECHNIQUE: Multidetector CT imaging of the head and neck was performed using the standard protocol during bolus administration of intravenous contrast. Multiplanar CT image reconstructions and MIPs were obtained to evaluate the vascular anatomy. Carotid stenosis measurements (when applicable) are obtained utilizing NASCET criteria, using the distal internal carotid diameter as the denominator. RADIATION DOSE REDUCTION: This exam was performed according to the departmental dose-optimization program which includes automated exposure control, adjustment of the mA and/or kV according to patient size and/or use of iterative reconstruction technique. CONTRAST:  19m OMNIPAQUE IOHEXOL 350 MG/ML SOLN COMPARISON:  Plain head CT 03/21/2022. FINDINGS: CTA NECK Skeleton: No acute osseous abnormality identified. Tympanic cavities and Visualized paranasal sinuses and mastoids are clear. Upper chest: Negative. Other neck: Motion artifact at the soft palate, oropharynx. No acute neck soft tissue finding identified. Aortic arch: Mild arch atherosclerosis. Three vessel arch configuration. Right carotid system: Minimal brachiocephalic artery origin plaque without stenosis. Normal right CCA in total lateral calcified plaque which is just proximal to the bifurcation not resulting in stenosis (series 4, image 115). Patent carotid bifurcation. Bulky calcified plaque at the right ICA origin and bulb. Radiographic string sign stenosis results as seen on series 6 images  207 through 201, along a segment of up to 16 mm. Right ICA remains patent and is otherwise negative to the skull base. Left carotid system: Mild left CCA origin plaque without stenosis. Bulky calcified plaque beginning at the left carotid bifurcation and continuing into the left ICA origin and bulb with high-grade stenosis just before the left ICA origin on series 6, image 217 approaching a radiographic string sign. Origin and bulb calcified plaque then results in less than 50 % stenosis with respect to the distal vessel. Vertebral arteries: Calcified plaque at the right subclavian artery origin without stenosis. Calcified plaque near the right vertebral artery origin,. Additional right V1 (series 6, images 288 and 273) with at least moderate V1 segment stenosis. But the right vertebral artery remains patent to the skull base with no additional plaque. Proximal left subclavian artery calcified plaque with no significant stenosis but bulky and long segment nearly 2.5 cm calcified plaque throughout the proximal left vertebral artery (series 7, image 128) with high-grade stenosis. However, the left vertebral is mildly dominant and remains patent. No additional  plaque or stenosis to the skull base. CTA HEAD Posterior circulation: Mildly dominant left vertebral V4 segment with normal left PICA origin and vertebrobasilar junction. No distal left vertebral plaque or stenosis. Contralateral right vertebral V4 segment moderate calcified plaque and stenosis on series 6, image 147. But the right vertebral remains patent to the vertebrobasilar junction. The right AICA appears dominant. Patent basilar artery with mild irregularity. No significant basilar stenosis. Patent SCA and PCA origins. Right posterior communicating artery is present. Bilateral PCA branches are patent with mild irregularity. Anterior circulation: Both ICA siphons are patent. Only mild supraclinoid ICA calcified plaque. No significant ICA siphon stenosis.  Patent carotid termini. Patent MCA and ACA origins. Normal anterior communicating artery. Bilateral ACA branches are within normal limits. Left MCA M1 segment and bifurcation are patent without stenosis. Left MCA branches appear mildly irregular. Right MCA M1 segment is tortuous and bifurcates early without stenosis. Right MCA branches appear patent with mild irregularity. Venous sinuses: Early contrast timing, grossly patent. Anatomic variants: Mildly dominant left vertebral artery. Review of the MIP images confirms the above findings IMPRESSION: 1. Negative for large vessel occlusion, but positive for bulky calcified plaque at both carotid bifurcations and the proximal left vertebral artery: - Right ICA origin and bulb RADIOGRAPHIC STRING SIGN. - proximal Left Vertebral Artery RADIOGRAPHIC STRING SIGN. - Left Carotid Bifurcation High-grade Stenosis, approaching a radiographic string sign. 2. Also moderate Right Vertebral Artery V1 and V4 segment stenoses due to plaque. 3. Aortic Atherosclerosis (ICD10-I70.0). Electronically Signed   By: Genevie Ann M.D.   On: 03/22/2022 05:12   CT HEAD WO CONTRAST  Result Date: 03/21/2022 CLINICAL DATA:  Dizziness. EXAM: CT HEAD WITHOUT CONTRAST TECHNIQUE: Contiguous axial images were obtained from the base of the skull through the vertex without intravenous contrast. RADIATION DOSE REDUCTION: This exam was performed according to the departmental dose-optimization program which includes automated exposure control, adjustment of the mA and/or kV according to patient size and/or use of iterative reconstruction technique. COMPARISON:  None Available. FINDINGS: Brain: No evidence of acute infarction, hemorrhage, hydrocephalus, extra-axial collection or mass lesion/mass effect. Vascular: No hyperdense vessel or unexpected calcification. Skull: Normal. Negative for fracture or focal lesion. Sinuses/Orbits: There is mild right ethmoid sinus mucosal thickening. Other: None. IMPRESSION: No  acute intracranial abnormality. Electronically Signed   By: Virgina Norfolk M.D.   On: 03/21/2022 19:58    Microbiology: Results for orders placed or performed in visit on 10/14/20  Fecal occult blood, imunochemical     Status: None   Collection Time: 10/15/20 12:00 AM  Result Value Ref Range Status   Fecal Occult Bld Negative Negative Final    Labs: CBC: Recent Labs  Lab 03/21/22 1929  WBC 8.2  NEUTROABS 5.0  HGB 14.5  HCT 41.1  MCV 90.3  PLT 528   Basic Metabolic Panel: Recent Labs  Lab 03/21/22 1929  NA 131*  K 4.2  CL 98  CO2 24  GLUCOSE 153*  BUN 15  CREATININE 1.02  CALCIUM 8.8*   Liver Function Tests: Recent Labs  Lab 03/21/22 1929  AST 36  ALT 41  ALKPHOS 69  BILITOT 1.8*  PROT 7.4  ALBUMIN 4.3   CBG: Recent Labs  Lab 03/21/22 1933 03/22/22 1010 03/22/22 1104  GLUCAP 146* 200* 151*    Discharge time spent: greater than 30 minutes.  Signed: Collier Bullock, MD [facility] 03/22/2022

## 2022-03-22 NOTE — ED Notes (Signed)
Pt to CT

## 2022-03-22 NOTE — H&P (Signed)
Calvin Smith QMG:[id number] DOB: 04-Nov-1960 DOA: 03/22/2022     PCP: [name], MD   Outpatient Specialists:  Endocrinology [place]  Patient arrived to ER on  at  Referred by Attending Mansy, Arvella Merles, MD   Patient coming from:    home Lives  With family    Chief Complaint: ataxia   HPI: Calvin Smith is a 61 y.o. male with medical history significant of diabetes mellitus, hypertension, dyslipidemia, migraine headaches and sleep apnea     Presented with   ataxia Patient presented to [place] with an episode of staggering and unsteady gait started at 5:30 PM he was cleaning leaves out of a pool when he started to feel very unsteady and difficulty ambulation.  He was able to get into a house and lay down on the couch was associated with nausea diaphoresis and lightheadedness lasting about 20 minutes and then resolved.  His wife noted that he was diaphoretic and pale but that has improved no prior history of such EMS was called and patient was transferred to [facility] ER no associated chest pain on headache no blurred vision no fevers or chills initially patient had a CT scan done which was unremarkable CT angiogram of head and neck showed no large vessel occlusion but bulky calcified plaque at both carotid bifurcations and proximal left vertebral artery Neurology was consulted and recommended transfer to [facility] for possible diagnosis and potential therapeutic catheter angiogram  Denies any slurred speech No hx of tobacco or etoh No hx of CVA No family hx of CVA Very active at baseline    Regarding pertinent Chronic problems:     Hyperlipidemia -  on statins Lipitor (atorvastatin)  Lipid Panel     Component Value Date/Time   CHOL 131 11/01/2021 1143   TRIG 86 11/01/2021 1143   HDL 43 11/01/2021 1143   CHOLHDL 3.0 11/01/2021 1143   LDLCALC 71 11/01/2021 1143   LABVLDL 17 11/01/2021 1143     HTN on Lisinopril      DM 2 -  Lab Results  Component Value Date    HGBA1C 6.1 (H) 03/21/2022   on insulin,          OSA -  noncompliant with CPAP       While at [place]     CT HEAD   NON acute CTA - 1. Negative for large vessel occlusion, but positive for bulky calcified plaque at both carotid bifurcations and the proximal left vertebral artery: - Right ICA origin and bulb RADIOGRAPHIC STRING SIGN. - proximal Left Vertebral Artery RADIOGRAPHIC STRING SIGN. - Left Carotid Bifurcation High-grade Stenosis, approaching a radiographic string sign. 2. Also moderate Right Vertebral Artery V1 and V4 segment stenoses due to plaque.  CXR -  NON acute  MRI brain No acute intracranial abnormality. _______________________________________________________ ER Provider Called:   Neurology   [name] They Recommend admit to medicine       ED Triage Vitals  Enc Vitals Group     BP      Pulse      Resp      Temp      Temp src      SpO2      Weight      Height      Head Circumference      Peak Flow      Pain Score      Pain Loc      Pain Edu?  Excl. in [place]?   NFAO(13)@     _________________________________________ Significant initial  Findings: Abnormal Labs Reviewed - No data to display   _________________________ Troponin 6 ECG: Ordered Personally reviewed and interpreted by me showing: HR : 99 Rhythm:  NSR   nonspecific changes,   QTC 423     WBC     Component Value Date/Time   WBC 8.2 03/21/2022 1929   LYMPHSABS 2.2 03/21/2022 1929   LYMPHSABS 2.0 03/14/2017 1119   MONOABS 0.9 03/21/2022 1929   EOSABS 0.1 03/21/2022 1929   EOSABS 0.1 03/14/2017 1119   BASOSABS 0.0 03/21/2022 1929   BASOSABS 0.0 03/14/2017 1119        Results for orders placed or performed in visit on 10/14/20  Fecal occult blood, imunochemical     Status: None   Collection Time: 10/15/20 12:00 AM  Result Value Ref Range Status   Fecal Occult Bld Negative Negative Final     _______________________________________________ Hospitalist was called  for admission for TIA   The following Work up has been ordered so far:  No orders of the defined types were placed in this encounter.    OTHER Significant initial  Findings:  labs showing:    Recent Labs  Lab 03/21/22 1929  NA 131*  K 4.2  CO2 24  GLUCOSE 153*  BUN 15  CREATININE 1.02  CALCIUM 8.8*    Cr   stable,    Lab Results  Component Value Date   CREATININE 1.02 03/21/2022   CREATININE 1.08 11/01/2021   CREATININE 1.23 02/17/2020    Recent Labs  Lab 03/21/22 1929  AST 36  ALT 41  ALKPHOS 69  BILITOT 1.8*  PROT 7.4  ALBUMIN 4.3   Lab Results  Component Value Date   CALCIUM 8.8 (L) 03/21/2022    Plt: Lab Results  Component Value Date   PLT 204 03/21/2022        Recent Labs  Lab 03/21/22 1929  WBC 8.2  NEUTROABS 5.0  HGB 14.5  HCT 41.1  MCV 90.3  PLT 204    HG/HCT  stable,     Component Value Date/Time   HGB 14.5 03/21/2022 1929   HGB 15.5 11/01/2021 1143   HCT 41.1 03/21/2022 1929   HCT 44.1 11/01/2021 1143   MCV 90.3 03/21/2022 1929   MCV 93 11/01/2021 1143      DM  labs:  HbA1C: Recent Labs    11/01/21 1058 03/21/22 1929  HGBA1C 6.8* 6.1*        CBG (last 3)  Recent Labs    03/22/22 1010 03/22/22 1104 03/22/22 1814  GLUCAP 200* 151* 197*       Cultures: No results found for: "SDES", "SPECREQUEST", "CULT", "REPTSTATUS"   Radiological Exams on Admission: DG CHEST PORT 1 VIEW  Result Date: 03/22/2022 CLINICAL DATA:  Recent TIA EXAM: PORTABLE CHEST 1 VIEW COMPARISON:  10/17/2006 FINDINGS: The heart size and mediastinal contours are within normal limits. Both lungs are clear. The visualized skeletal structures are unremarkable. IMPRESSION: No active disease. Electronically Signed   By: Inez Catalina M.D.   On: 03/22/2022 22:38   MR BRAIN WO CONTRAST  Result Date: 03/22/2022 CLINICAL DATA:  61 year old male TIA. Dizziness. Unsteady gait. Neurologic deficit. EXAM: MRI HEAD WITHOUT CONTRAST TECHNIQUE: Multiplanar,  multiecho pulse sequences of the brain and surrounding structures were obtained without intravenous contrast. COMPARISON:  CTA head and neck earlier today. FINDINGS: Brain: No restricted diffusion to suggest acute infarction. No midline shift, mass  effect, evidence of mass lesion, ventriculomegaly, extra-axial collection or acute intracranial hemorrhage. Cervicomedullary junction and pituitary are within normal limits. Scattered small subcortical white matter T2 and FLAIR hyperintense foci, mild to moderate for age. Occasional periventricular involvement. No cortical encephalomalacia or chronic cerebral blood products identified. Deep gray nuclei, brainstem, and cerebellum are normal for age. Vascular: Major intracranial vascular flow voids are preserved. Skull and upper cervical spine: Negative. Visualized bone marrow signal is within normal limits. Sinuses/Orbits: Negative. Other: Mastoids are clear. Grossly negative visible internal auditory structures. Normal stylomastoid foramina. Negative visible scalp and face. IMPRESSION: 1. No acute intracranial abnormality. 2. Mild to moderate for age nonspecific cerebral white matter signal changes, most commonly due to chronic small vessel disease. Electronically Signed   By: Genevie Ann M.D.   On: 03/22/2022 09:19   CT ANGIO HEAD NECK W WO CM  Result Date: 03/22/2022 CLINICAL DATA:  61 year old male TIA.  Dizziness. EXAM: CT ANGIOGRAPHY HEAD AND NECK TECHNIQUE: Multidetector CT imaging of the head and neck was performed using the standard protocol during bolus administration of intravenous contrast. Multiplanar CT image reconstructions and MIPs were obtained to evaluate the vascular anatomy. Carotid stenosis measurements (when applicable) are obtained utilizing NASCET criteria, using the distal internal carotid diameter as the denominator. RADIATION DOSE REDUCTION: This exam was performed according to the departmental dose-optimization program which includes automated  exposure control, adjustment of the mA and/or kV according to patient size and/or use of iterative reconstruction technique. CONTRAST:  36m OMNIPAQUE IOHEXOL 350 MG/ML SOLN COMPARISON:  Plain head CT 03/21/2022. FINDINGS: CTA NECK Skeleton: No acute osseous abnormality identified. Tympanic cavities and Visualized paranasal sinuses and mastoids are clear. Upper chest: Negative. Other neck: Motion artifact at the soft palate, oropharynx. No acute neck soft tissue finding identified. Aortic arch: Mild arch atherosclerosis. Three vessel arch configuration. Right carotid system: Minimal brachiocephalic artery origin plaque without stenosis. Normal right CCA in total lateral calcified plaque which is just proximal to the bifurcation not resulting in stenosis (series 4, image 115). Patent carotid bifurcation. Bulky calcified plaque at the right ICA origin and bulb. Radiographic string sign stenosis results as seen on series 6 images 207 through 201, along a segment of up to 16 mm. Right ICA remains patent and is otherwise negative to the skull base. Left carotid system: Mild left CCA origin plaque without stenosis. Bulky calcified plaque beginning at the left carotid bifurcation and continuing into the left ICA origin and bulb with high-grade stenosis just before the left ICA origin on series 6, image 217 approaching a radiographic string sign. Origin and bulb calcified plaque then results in less than 50 % stenosis with respect to the distal vessel. Vertebral arteries: Calcified plaque at the right subclavian artery origin without stenosis. Calcified plaque near the right vertebral artery origin,. Additional right V1 (series 6, images 288 and 273) with at least moderate V1 segment stenosis. But the right vertebral artery remains patent to the skull base with no additional plaque. Proximal left subclavian artery calcified plaque with no significant stenosis but bulky and long segment nearly 2.5 cm calcified plaque  throughout the proximal left vertebral artery (series 7, image 128) with high-grade stenosis. However, the left vertebral is mildly dominant and remains patent. No additional plaque or stenosis to the skull base. CTA HEAD Posterior circulation: Mildly dominant left vertebral V4 segment with normal left PICA origin and vertebrobasilar junction. No distal left vertebral plaque or stenosis. Contralateral right vertebral V4 segment moderate calcified plaque and stenosis  on series 6, image 147. But the right vertebral remains patent to the vertebrobasilar junction. The right AICA appears dominant. Patent basilar artery with mild irregularity. No significant basilar stenosis. Patent SCA and PCA origins. Right posterior communicating artery is present. Bilateral PCA branches are patent with mild irregularity. Anterior circulation: Both ICA siphons are patent. Only mild supraclinoid ICA calcified plaque. No significant ICA siphon stenosis. Patent carotid termini. Patent MCA and ACA origins. Normal anterior communicating artery. Bilateral ACA branches are within normal limits. Left MCA M1 segment and bifurcation are patent without stenosis. Left MCA branches appear mildly irregular. Right MCA M1 segment is tortuous and bifurcates early without stenosis. Right MCA branches appear patent with mild irregularity. Venous sinuses: Early contrast timing, grossly patent. Anatomic variants: Mildly dominant left vertebral artery. Review of the MIP images confirms the above findings IMPRESSION: 1. Negative for large vessel occlusion, but positive for bulky calcified plaque at both carotid bifurcations and the proximal left vertebral artery: - Right ICA origin and bulb RADIOGRAPHIC STRING SIGN. - proximal Left Vertebral Artery RADIOGRAPHIC STRING SIGN. - Left Carotid Bifurcation High-grade Stenosis, approaching a radiographic string sign. 2. Also moderate Right Vertebral Artery V1 and V4 segment stenoses due to plaque. 3. Aortic  Atherosclerosis (ICD10-I70.0). Electronically Signed   By: Genevie Ann M.D.   On: 03/22/2022 05:12   CT HEAD WO CONTRAST  Result Date: 03/21/2022 CLINICAL DATA:  Dizziness. EXAM: CT HEAD WITHOUT CONTRAST TECHNIQUE: Contiguous axial images were obtained from the base of the skull through the vertex without intravenous contrast. RADIATION DOSE REDUCTION: This exam was performed according to the departmental dose-optimization program which includes automated exposure control, adjustment of the mA and/or kV according to patient size and/or use of iterative reconstruction technique. COMPARISON:  None Available. FINDINGS: Brain: No evidence of acute infarction, hemorrhage, hydrocephalus, extra-axial collection or mass lesion/mass effect. Vascular: No hyperdense vessel or unexpected calcification. Skull: Normal. Negative for fracture or focal lesion. Sinuses/Orbits: There is mild right ethmoid sinus mucosal thickening. Other: None. IMPRESSION: No acute intracranial abnormality. Electronically Signed   By: Virgina Norfolk M.D.   On: 03/21/2022 19:58   _______________________________________________________________________________________________________ Latest   There were no vitals taken for this visit.   Vitals  labs and radiology finding personally reviewed  Review of Systems:    Pertinent positives include:    gait abnormality,  Constitutional:  No weight loss, night sweats, Fevers, chills, fatigue, weight loss  HEENT:  No headaches, Difficulty swallowing,Tooth/dental problems,Sore throat,  No sneezing, itching, ear ache, nasal congestion, post nasal drip,  Cardio-vascular:  No chest pain, Orthopnea, PND, anasarca, dizziness, palpitations.no Bilateral lower extremity swelling  GI:  No heartburn, indigestion, abdominal pain, nausea, vomiting, diarrhea, change in bowel habits, loss of appetite, melena, blood in stool, hematemesis Resp:  no shortness of breath at rest. No dyspnea on exertion, No  excess mucus, no productive cough, No non-productive cough, No coughing up of blood.No change in color of mucus.No wheezing. Skin:  no rash or lesions. No jaundice GU:  no dysuria, change in color of urine, no urgency or frequency. No straining to urinate.  No flank pain.  Musculoskeletal:  No joint pain or no joint swelling. No decreased range of motion. No back pain.  Psych:  No change in mood or affect. No depression or anxiety. No memory loss.  Neuro: no localizing neurological complaints, no tingling, no weakness, no double vision, nono slurred speech, no confusion  All systems reviewed and apart from [place] all are negative _______________________________________________________________________________________________ Past  Medical History:   Past Medical History:  Diagnosis Date   Allergic rhinitis    Arm fracture, left as a child   DeQuervain's disease (tenosynovitis)    right wrist   Diabetes mellitus    type 2   Erectile dysfunction    Hyperlipidemia    Hypertension    Migraines    Sleep apnea       Past Surgical History:  Procedure Laterality Date   Cardiolite  5/06   Negative EF 66%   CARPAL TUNNEL RELEASE  9/03   left    WRIST SURGERY  1/04   DeQuervain release   WRIST SURGERY  1/08   ORIF l wrist DVR plate screwhead autologous    Social History:  Ambulatory   independently      reports that he has never smoked. He has never used smokeless tobacco. He reports current alcohol use. He reports that he does not use drugs.     Family History:   Family History  Problem Relation Age of Onset   Hypertension Father    Lymphoma Father        non-hodgkins   Uterine cancer Maternal Grandmother    Hypertension Paternal Grandfather    Lung cancer Paternal Aunt    Coronary artery disease Paternal Uncle    Colon cancer Unknown        in 1 relative (?aunt)    ______________________________________________________________________________________________ Allergies: Allergies  Allergen Reactions   Bisoprolol-Hydrochlorothiazide     REACTION: E.D.   Codeine     REACTION: itch     Prior to Admission medications   Medication Sig Start Date End Date Taking? Authorizing Provider  aspirin EC 81 MG tablet Take 81 mg by mouth daily.    [provider]  atorvastatin (LIPITOR) 80 MG tablet Take 1 tablet (80 mg total) by mouth daily. 03/23/22 06/21/22  Collier Bullock, MD  clopidogrel (PLAVIX) 75 MG tablet Take 1 tablet (75 mg total) by mouth daily. 03/23/22 04/22/22  Agbata, Tochukwu, MD  LANTUS SOLOSTAR 100 UNIT/ML Solostar Pen INJECT SUBCUTANEOUSLY 90  UNITS AT BEDTIME Patient taking differently: 45 Units 2 (two) times daily. 03/01/22   [name], MD  omeprazole (PRILOSEC) 20 MG capsule TAKE 1 CAPSULE BY MOUTH IN THE MORNING AND AT BEDTIME. 10/03/21   [name], MD    ___________________________________________________________________________________________________ Physical Exam:    03/22/2022    9:10 PM 03/22/2022    8:16 PM 03/22/2022    7:44 PM  Vitals with BMI  Systolic 161 096 045  Diastolic 86 80 77  Pulse 86 90 95     1. General:  in No  Acute distress   well   -appearing 2. Psychological: Alert and   Oriented 3. Head/ENT:    Dry Mucous Membranes                          Head Non traumatic, neck supple                           Poor Dentition 4. SKIN:  decreased Skin turgor,  Skin clean Dry and intact no rash 5. Heart: Regular rate and rhythm no  Murmur, no Rub or gallop 6. Lungs  no wheezes or crackles   7. Abdomen: Soft,  non-tender, Non distended bowel sounds present 8. Lower extremities: no clubbing, cyanosis, no  edema 9. Neurologically strength 5 out of 5 in all  4 extremities cranial nerves II through XII intact 10. MSK: Normal range of motion    Chart has been  reviewed  ______________________________________________________________________________________________  Assessment/Plan 61 y.o. male with medical history significant of diabetes mellitus, hypertension, dyslipidemia, migraine headaches and sleep apnea     Admitted for TIA and  Symptomatic vertebrobasilar insufficiency   Present on Admission:  TIA (transient ischemic attack)  Type 2 diabetes mellitus with circulatory disorder ([place])  Essential hypertension, benign  GERD (gastroesophageal reflux disease)  Hyperlipemia     TIA (transient ischemic attack)  - will admit based on TIA/CVA protocol        Monitor on Tele    /MRI Resulted - showing no acute ischemic CVA        CTA showed significant stenosis Patient was started on aspirin Plavix and high intensity statin       Echo to evaluate for possible embolic source was ordered       obtain cardiac enzymes,  ECG,   Lipid panel, TSH.        Order PT/OT evaluation.            Will make sure patient is on antiplatelet ASA 81 Plavix agent and statin        Allow permissive Hypertension keep BP <220/120        Neurology consulted will see patient in neuro on arrival to [facility]   Type 2 diabetes mellitus with circulatory disorder ([place]) Order sliding scale continue Lantus at slightly decreased dose of 30 mg twice daily as patient may have episodes of being n.p.o.  Essential hypertension, benign Allow permissive hypertension  GERD (gastroesophageal reflux disease) Chronic stable continue Protonix 40 mg daily  Hyperlipemia Continue Lipitor 80 mg p.o. daily check lipid panel   Other plan as per orders.  DVT prophylaxis:  SCD     Code Status:    Code Status: Prior FULL CODE  as per patient   I had personally discussed CODE STATUS with patient     Family Communication:   Family   at  Bedside  plan of care was discussed  with   Wife,    Disposition Plan:      To home once workup is complete and patient is stable     Following barriers for discharge:                            Electrolytes corrected                                                         Will need consultants to evaluate patient prior to discharge  Would benefit from PT/OT eval prior to [place] called: neurology is aware have seen in consult    Obs      Level of care     tele   indefinitely please discontinue once patient no longer qualifies COVID-19 Labs     Verlan Grotz 03/22/2022, 11:54 PM    [facility]     after 2 AM please page floor coverage  PA If 7AM-7PM, please contact the day team taking care of the patient using [email or web address]   Patient was evaluated in the context of the global COVID-19 pandemic, which necessitated consideration that the patient might be at risk for infection with the SARS-CoV-2 virus that causes COVID-19. Institutional protocols and algorithms that pertain to the evaluation of patients at risk for COVID-19 are in a state of rapid change based on information released by regulatory bodies including the CDC and federal and state organizations. These policies and algorithms were followed during the patient's care.

## 2022-03-22 NOTE — Progress Notes (Signed)
Neurology Progress Note  Patient ID: Calvin Smith is a 61 y.o. with PMHx of  has a past medical history of Allergic rhinitis, Arm fracture, left (as a child), DeQuervain's disease (tenosynovitis), Diabetes mellitus, Erectile dysfunction, Hyperlipidemia, Hypertension, Migraines, and Sleep apnea.  HPI from [name]:  This is a 61 year old gentleman with past medical history significant for diabetes, hypertension, hyperlipidemia, migraines, sleep apnea who presented to the ED for evaluation after an episode of ataxic gait which began at 5:30 PM yesterday when he was cleaning his pool.  He suddenly felt very unsteady on his feet and had gait instability.  He was able to get into the house without falling and then laid down on the couch.  He had vertigo and nausea at this time as well.  Symptoms resolved after 20 minutes.  Patient has not had any similar episodes in the past.  Patient was brought in by EMS for further evaluation.  At that time he was completely asymptomatic with a stroke scale of 0.  CT of the head without contrast showed no acute intracranial abnormality. CTA H&N showed multifocal severe stenosis in bilat carotids and L vert:   CTA H&N 1. Negative for large vessel occlusion, but positive for bulky calcified plaque at both carotid bifurcations and the proximal left vertebral artery: - Right ICA origin and bulb RADIOGRAPHIC STRING SIGN. - proximal Left Vertebral Artery RADIOGRAPHIC STRING SIGN. - Left Carotid Bifurcation High-grade Stenosis, approaching a radiographic string sign. 2. Also moderate Right Vertebral Artery V1 and V4 segment stenoses due to plaque. 3. Aortic Atherosclerosis (ICD10-I70.0).   MRI brain wo contrast showed no e/o acute infarct, moderate CSVID   CNS imaging personally reviewed.  Subjective: - Feels appropriately anxious, otherwise no acute complaints other than some chronic neck pain  Exam: There were no vitals filed for this visit.   Physical  Exam  Constitutional: Appears well-developed and well-nourished.  Psych: Affect appropriate to situation Eyes: No scleral injection HENT: No OP obstrucion MSK: no joint deformities.  Cardiovascular: Normal rate and regular rhythm.  Respiratory: Effort normal, non-labored breathing GI: Soft.  No distension. There is no tenderness.  Skin: WDI  Neuro: Mental Status: Patient is awake, alert, oriented to person, place, month, year, and situation. Patient is able to give a clear and coherent history. No signs of aphasia or neglect Cranial Nerves: II: Visual Fields are full. Pupils are equal, round, and reactive to light.   III,IV, VI: EOMI without ptosis or diploplia. Mildly saccadic pursuits V: Facial sensation is symmetric to light touch VII: Facial movement is symmetric.  VIII: hearing is intact to voice and finger rub X: Uvula elevates symmetrically, Mallampati II XI: Shoulder shrug is symmetric. XII: tongue is midline without atrophy or fasciculations.  Motor: Tone is normal. Bulk is normal. 5/5 strength was present in all four extremities. Pronation of LUE without drift which wife and patient reports is chronic secondary to prior surgery Sensory: Sensation is symmetric to light touch and temperature in the arms and legs. Deep Tendon Reflexes: 2+ and symmetric in the biceps and 3+ and symmetric patellae, absent left brachioradialis  Cerebellar: FNF and HKS are intact bilaterally  NIHSS total 0    Pertinent Labs:  Basic Metabolic Panel: Recent Labs  Lab 03/21/22 1929  NA 131*  K 4.2  CL 98  CO2 24  GLUCOSE 153*  BUN 15  CREATININE 1.02  CALCIUM 8.8*    CBC: Recent Labs  Lab 03/21/22 1929  WBC 8.2  NEUTROABS  5.0  HGB 14.5  HCT 41.1  MCV 90.3  PLT 204    Coagulation Studies: Recent Labs    03/21/22 1929  LABPROT 14.2  INR 1.1     Impression: Symptomatic vertebrobasilar insufficiency pending angiogram and potential  angioplasty  Recommendations: - continue DAPT - 4 vessel angio tomorrow, appreciate Dr. Karenann Cai - Permissive hypertension - N.p.o. at midnight  [facility] MD-PhD [facility] [phone]

## 2022-03-22 NOTE — Assessment & Plan Note (Signed)
Chronic stable continue Protonix 40 mg daily

## 2022-03-22 NOTE — Assessment & Plan Note (Signed)
Order sliding scale continue Lantus at slightly decreased dose of 30 mg twice daily as patient may have episodes of being n.p.o.

## 2022-03-22 NOTE — Assessment & Plan Note (Addendum)
Patient presents for evaluation of sudden onset of unsteady gait associated with dizziness and nausea which has resolved. CT scan of the head does not show any acute findings and MRI is negative for an acute stroke. CT angiogram of the head and neck shows significant stenosis. Patient was seen by neurologist who recommends transfer to [facility] for diagnostic and potentially therapeutic catheter angiogram for severe multifocal stenosis involving the extracranial cerebral vasculature. Allow for permissive hypertension Continue aspirin, Plavix and high intensity statins Follow-up results of 2D echocardiogram Stat head CT and activate code stroke for any change in neuro exam

## 2022-03-22 NOTE — ED Notes (Signed)
Called [facility] spoke to [place] patient waiting for bed assignment [phone]

## 2022-03-22 NOTE — H&P (Addendum)
History and Physical    Patient: Calvin Smith:[id number] DOB: [birth year] DOA: 03/22/2022 DOS: the patient was seen and examined on 03/22/2022 PCP: [name], MD  Patient coming from: Home  Chief Complaint:  Chief Complaint  Patient presents with   Dizziness   HPI: Calvin Smith is a 61 y.o. male with medical history significant for diabetes mellitus, hypertension, dyslipidemia, migraine headaches and sleep apnea who presented to the ER for evaluation of an episode of staggering/unsteady gait which happened suddenly at about 5:30 PM while he was cleaning his pool.  Patient states that that he was using admits to cleaning leaves out of his pool when he suddenly felt very unsteady on his feet and had difficulty ambulating.  He managed to get into the house without falling and lay down on the couch.  He also complains of nausea, diaphoresis and dizziness.  Symptoms lasted about 20 minutes and resolved. According to his wife she noted that he was diaphoretic and pale but this has improved as well. Patient has never had any symptoms like this in the past. His wife called EMS and patient was evaluated at home was transported to the ER by his wife. He denies having any chest pain, no vomiting, no headache, no blurred vision, no difficulty swallowing, no changes in his bowel habits, no urinary symptoms, no fever, no chills, no cough. Patient had a CT scan of the head without contrast which showed no acute abnormality. He had a CT angiogram of the head and neck which was negative for large vessel occlusion, but positive for bulky calcified plaque at both carotid bifurcations and the proximal left vertebral artery:Right ICA origin and bulb RADIOGRAPHIC STRING SIGN. proximal Left Vertebral Artery RADIOGRAPHIC STRING SIGN. Left Carotid Bifurcation High-grade Stenosis, approaching a radiographic string sign. Also moderate Right Vertebral Artery V1 and V4 segment stenoses due to  plaque. Aortic Atherosclerosis. Patient will be referred to observation status for further evaluation.  Review of Systems: As mentioned in the history of present illness. All other systems reviewed and are negative. Past Medical History:  Diagnosis Date   Allergic rhinitis    Arm fracture, left as a child   DeQuervain's disease (tenosynovitis)    right wrist   Diabetes mellitus    type 2   Erectile dysfunction    Hyperlipidemia    Hypertension    Migraines    Sleep apnea    Past Surgical History:  Procedure Laterality Date   Cardiolite  5/06   Negative EF 66%   CARPAL TUNNEL RELEASE  9/03   left    WRIST SURGERY  1/04   DeQuervain release   WRIST SURGERY  1/08   ORIF l wrist DVR plate screwhead autologous   Social History:  reports that he has never smoked. He has never used smokeless tobacco. He reports current alcohol use. He reports that he does not use drugs.  Allergies  Allergen Reactions   Bisoprolol-Hydrochlorothiazide     REACTION: E.D.   Codeine     REACTION: itch    Family History  Problem Relation Age of Onset   Hypertension Father    Lymphoma Father        non-hodgkins   Uterine cancer Maternal Grandmother    Hypertension Paternal Grandfather    Lung cancer Paternal Aunt    Coronary artery disease Paternal Uncle    Colon cancer Unknown        in 1 relative (?aunt)    Prior to  Admission medications   Medication Sig Start Date End Date Taking? Authorizing Provider  aspirin EC 81 MG tablet Take 81 mg by mouth daily.   Yes [provider]  atorvastatin (LIPITOR) 20 MG tablet TAKE 1 TABLET BY MOUTH  DAILY 08/04/21  Yes [name], MD  empagliflozin (JARDIANCE) 25 MG TABS tablet Take 1 tablet (25 mg total) by mouth daily. 09/02/21  Yes Viviana Simpler I, MD  glipiZIDE (GLUCOTROL XL) 5 MG 24 hr tablet TAKE 1 TABLET BY MOUTH  TWICE DAILY 01/30/22  Yes [name], MD  LANTUS SOLOSTAR 100 UNIT/ML Solostar Pen INJECT SUBCUTANEOUSLY 90   UNITS AT BEDTIME Patient taking differently: 45 Units 2 (two) times daily. 03/01/22  Yes Viviana Simpler I, MD  lisinopril (ZESTRIL) 20 MG tablet TAKE 1 TABLET BY MOUTH  DAILY 10/17/21  Yes Viviana Simpler I, MD  metFORMIN (GLUCOPHAGE) 1000 MG tablet TAKE 1 TABLET BY MOUTH  TWICE DAILY WITH MEALS 11/13/21  Yes Viviana Simpler I, MD  omeprazole (PRILOSEC) 20 MG capsule TAKE 1 CAPSULE BY MOUTH IN THE MORNING AND AT BEDTIME. 10/03/21  Yes Viviana Simpler I, MD  rizatriptan (MAXALT) 10 MG tablet TAKE ONE TABLET DAILY AS NEEDED FOR MIGRAINE MAY REPEAT IN 2 HOURS IF NEEDED 12/22/20  Yes [name], MD  tadalafil (CIALIS) 20 MG tablet Take 0.5-1 tablets (10-20 mg total) by mouth every other day as needed for erectile dysfunction. 08/06/19  Yes Viviana Simpler I, MD  IBU 800 MG tablet Take 800 mg by mouth every 8 (eight) hours. 10/25/20   [provider]  Semaglutide, 2 MG/DOSE, 8 MG/3ML SOPN Inject into the skin. 10/06/21   [provider]  triamcinolone (KENALOG) 0.1 % Apply topically 2 (two) times daily. Patient not taking: Reported on 03/22/2022 05/27/20   [provider]    Physical Exam: Vitals:   03/21/22 2343 03/22/22 0329 03/22/22 0433 03/22/22 0800  BP: 124/76 109/77 111/83 112/80  Pulse: 88 87 82 98  Resp: '18 17 18 '$ (!) 24  Temp: 98.5 F (36.9 C) 98.1 F (36.7 C) 98 F (36.7 C) 98.2 F (36.8 C)  TempSrc: Oral Oral Oral Oral  SpO2: 99% 99% 99% 97%  Weight:      Height:       Physical Exam Vitals and nursing note reviewed.  Constitutional:      Appearance: Normal appearance.  HENT:     Head: Normocephalic and atraumatic.     Nose: Nose normal.     Mouth/Throat:     Mouth: Mucous membranes are moist.  Eyes:     Pupils: Pupils are equal, round, and reactive to light.  Cardiovascular:     Rate and Rhythm: Normal rate and regular rhythm.  Pulmonary:     Effort: Pulmonary effort is normal.     Breath sounds: Normal breath sounds.  Abdominal:      General: Abdomen is flat. Bowel sounds are normal.     Palpations: Abdomen is soft.  Musculoskeletal:        General: Normal range of motion.     Cervical back: Normal range of motion and neck supple.  Skin:    General: Skin is warm and dry.  Neurological:     Mental Status: He is alert and oriented to person, place, and time.  Psychiatric:        Mood and Affect: Mood normal.        Behavior: Behavior normal.     Data Reviewed: Relevant notes  from primary care and specialist visits, past discharge summaries as available in EHR, including Care Everywhere. Prior diagnostic testing as pertinent to current admission diagnoses Updated medications and problem lists for reconciliation ED course, including vitals, labs, imaging, treatment and response to treatment Triage notes, nursing and pharmacy notes and ED provider's notes Notable results as noted in HPI Labs reviewed.  Troponin 6, sodium 131, potassium 4.2, chloride 98, bicarb 24, glucose 153, BUN 15, creatinine 1.02, calcium 8.8, total protein 7.4, albumin 4.3, AST 36, ALT 41, alkaline phosphatase 69, total bilirubin 1.8, white count 8.2, hemoglobin 14.5, hematocrit 41.1, MCV 90.3, RDW 12.1, platelet count 204 CT scan of the head without contrast shows No acute intracranial abnormality. CT angio head and neck shows  1. Negative for large vessel occlusion, but positive for bulky calcified plaque at both carotid bifurcations and the proximal left vertebral artery: - Right ICA origin and bulb RADIOGRAPHIC STRING SIGN. - proximal Left Vertebral Artery RADIOGRAPHIC STRING SIGN. - Left Carotid Bifurcation High-grade Stenosis, approaching a radiographic string sign. 2. Also moderate Right Vertebral Artery V1 and V4 segment stenoses due to plaque. 3. Aortic Atherosclerosis (ICD10-I70.0). MRI of the brain shows no acute intracranial abnormality. Mild to moderate for age nonspecific cerebral white matter signal changes, most commonly due to  chronic small vessel disease. Twelve-lead EKG reviewed by me shows normal sinus rhythm There are no new results to review at this time.  Assessment and Plan: * TIA (transient ischemic attack) Patient presents for evaluation of sudden onset of unsteady gait associated with dizziness and nausea which has resolved. CT scan of the head does not show any acute findings and MRI is negative for an acute stroke. CT angiogram of the head and neck shows significant stenosis. Consult neurology Continue aspirin, Plavix and high intensity statins Follow-up results of 2D echocardiogram  Type 2 diabetes mellitus with circulatory disorder [facility]) Patient has a history of type 2 diabetes mellitus on insulin and multiple oral agents. Hold oral hypoglycemic agents for now Continue long-acting insulin at one half the dose Place patient on a consistent carbohydrate diet once his swallow screen has been done Sliding scale insulin  Essential hypertension, benign Hold lisinopril for now For permissive hypertension  GERD (gastroesophageal reflux disease) Stable Continue PPI      Advance Care Planning:   Code Status: Full Code   Consults: Neurology  Family Communication: Greater than 50% of time was spent discussing patient's condition and plan of care with him and his wife at the bedside.  All questions and concerns have been addressed.  They verbalized understanding and agree with the plan.  Severity of Illness: The appropriate patient status for this patient is OBSERVATION. Observation status is judged to be reasonable and necessary in order to provide the required intensity of service to ensure the patient's safety. The patient's presenting symptoms, physical exam findings, and initial radiographic and laboratory data in the context of their medical condition is felt to place them at decreased risk for further clinical deterioration. Furthermore, it is anticipated that the patient will be medically  stable for discharge from the hospital within 2 midnights of admission.   Author: Collier Bullock, MD 03/22/2022 9:30 AM  For on call review [email or web address].

## 2022-03-22 NOTE — Subjective & Objective (Signed)
Patient presented to [place] with an episode of staggering and unsteady gait started at 5:30 PM he was cleaning leaves out of a pool when he started to feel very unsteady and difficulty ambulation.  He was able to get into a house and lay down on the couch was associated with nausea diaphoresis and lightheadedness lasting about 20 minutes and then resolved.  His wife noted that he was diaphoretic and pale but that has improved no prior history of such EMS was called and patient was transferred to [facility] ER no associated chest pain on headache no blurred vision no fevers or chills initially patient had a CT scan done which was unremarkable CT angiogram of head and neck showed no large vessel occlusion but bulky calcified plaque at both carotid bifurcations and proximal left vertebral artery Neurology was consulted and recommended transfer to [facility] for possible diagnosis and potential therapeutic catheter angiogram

## 2022-03-22 NOTE — Consult Note (Signed)
NEUROLOGY CONSULTATION NOTE   Date of service: March 22, 2022 Patient Name: Calvin Smith MRN:  [id number] DOB:  Jan 03, 1961 Reason for consult: TIA, severe carotid and vertebral stenosis Requesting physician: Dr. Francine Graven _ _ _   _ __   _ __ _ _  __ __   _ __   __ _  History of Present Illness   This is a 61 year old gentleman with past medical history significant for diabetes, hypertension, hyperlipidemia, migraines, sleep apnea who presented to the ED for evaluation after an episode of ataxic gait which began at 5:30 PM yesterday when he was cleaning his pool.  He suddenly felt very unsteady on his feet and had gait instability.  He was able to get into the house without falling and then laid down on the couch.  He had vertigo and nausea at this time as well.  Symptoms resolved after 20 minutes.  Patient has not had any similar episodes in the past.  Patient was brought in by EMS for further evaluation.  At that time he was completely asymptomatic with a stroke scale of 0.  CT of the head without contrast showed no acute intracranial abnormality. CTA H&N showed multifocal severe stenosis in bilat carotids and L vert:  CTA H&N 1. Negative for large vessel occlusion, but positive for bulky calcified plaque at both carotid bifurcations and the proximal left vertebral artery: - Right ICA origin and bulb RADIOGRAPHIC STRING SIGN. - proximal Left Vertebral Artery RADIOGRAPHIC STRING SIGN. - Left Carotid Bifurcation High-grade Stenosis, approaching a radiographic string sign. 2. Also moderate Right Vertebral Artery V1 and V4 segment stenoses due to plaque. 3. Aortic Atherosclerosis (ICD10-I70.0).  MRI brain wo contrast showed no e/o acute infarct, moderate CSVID  CNS imaging personally reviewed.    ROS   Per HPI: all other systems reviewed and are negative  Past History   I have reviewed the following:  Past Medical History:  Diagnosis Date   Allergic rhinitis    Arm fracture,  left as a child   DeQuervain's disease (tenosynovitis)    right wrist   Diabetes mellitus    type 2   Erectile dysfunction    Hyperlipidemia    Hypertension    Migraines    Sleep apnea    Past Surgical History:  Procedure Laterality Date   Cardiolite  5/06   Negative EF 66%   CARPAL TUNNEL RELEASE  9/03   left    WRIST SURGERY  1/04   DeQuervain release   WRIST SURGERY  1/08   ORIF l wrist DVR plate screwhead autologous   Family History  Problem Relation Age of Onset   Hypertension Father    Lymphoma Father        non-hodgkins   Uterine cancer Maternal Grandmother    Hypertension Paternal Grandfather    Lung cancer Paternal Aunt    Coronary artery disease Paternal Uncle    Colon cancer Unknown        in 1 relative (?aunt)   Social History   Socioeconomic History   Marital status: Married    Spouse name: Not on file   Number of children: Not on file   Years of education: Not on file   Highest education level: Not on file  Occupational History   Occupation: Buyer, 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  Tobacco Use   Smoking status: Never   Smokeless tobacco: Never  Vaping Use   Vaping Use:  Never used  Substance and Sexual Activity   Alcohol use: Yes    Comment: rare   Drug use: No   Sexual activity: Yes    Partners: Female  Other Topics Concern   Not on file  Social History Narrative   Regular exercise: light to moderate   Caffeine use: coffee daily   Social Determinants of Health   Financial Resource Strain: Not on file  Food Insecurity: Not on file  Transportation Needs: Not on file  Physical Activity: Not on file  Stress: Not on file  Social Connections: Not on file   Allergies  Allergen Reactions   Bisoprolol-Hydrochlorothiazide     REACTION: E.D.   Codeine     REACTION: itch    Medications   (Not in a hospital admission)     Current Facility-Administered Medications:    [START ON 03/23/2022]  stroke: early  stages of recovery book, , Does not apply, Once, Derek Jack, MD   [START ON 03/23/2022]  stroke: early stages of recovery book, , Does not apply, Once, Agbata, Tochukwu, MD   acetaminophen (TYLENOL) tablet 650 mg, 650 mg, Oral, Q4H PRN **OR** acetaminophen (TYLENOL) 160 MG/5ML solution 650 mg, 650 mg, Per Tube, Q4H PRN **OR** acetaminophen (TYLENOL) suppository 650 mg, 650 mg, Rectal, Q4H PRN, Agbata, Tochukwu, MD   [COMPLETED] aspirin tablet 325 mg, 325 mg, Oral, Once, 325 mg at 03/22/22 1027 **FOLLOWED BY** [START ON 03/23/2022] aspirin chewable tablet 81 mg, 81 mg, Oral, Daily, Su Monks M, MD   atorvastatin (LIPITOR) tablet 80 mg, 80 mg, Oral, Daily, Agbata, Tochukwu, MD, 80 mg at 03/22/22 1028   clopidogrel (PLAVIX) tablet 75 mg, 75 mg, Oral, Daily, Derek Jack, MD, 75 mg at 03/22/22 1028   insulin aspart (novoLOG) injection 0-15 Units, 0-15 Units, Subcutaneous, TID WC, Agbata, Tochukwu, MD   insulin glargine-yfgn (SEMGLEE) injection 20 Units, 20 Units, Subcutaneous, BID, Agbata, Tochukwu, MD, 20 Units at 03/22/22 1027   pantoprazole (PROTONIX) EC tablet 40 mg, 40 mg, Oral, Daily, Agbata, Tochukwu, MD, 40 mg at 03/22/22 1030   senna-docusate (Senokot-S) tablet 1 tablet, 1 tablet, Oral, QHS PRN, Agbata, Tochukwu, MD  Current Outpatient Medications:    aspirin EC 81 MG tablet, Take 81 mg by mouth daily., Disp: , Rfl:    atorvastatin (LIPITOR) 20 MG tablet, TAKE 1 TABLET BY MOUTH  DAILY, Disp: 90 tablet, Rfl: 3   empagliflozin (JARDIANCE) 25 MG TABS tablet, Take 1 tablet (25 mg total) by mouth daily., Disp: 90 tablet, Rfl: 3   glipiZIDE (GLUCOTROL XL) 5 MG 24 hr tablet, TAKE 1 TABLET BY MOUTH  TWICE DAILY, Disp: 180 tablet, Rfl: 3   LANTUS SOLOSTAR 100 UNIT/ML Solostar Pen, INJECT SUBCUTANEOUSLY 90  UNITS AT BEDTIME (Patient taking differently: 45 Units 2 (two) times daily.), Disp: 90 mL, Rfl: 3   lisinopril (ZESTRIL) 20 MG tablet, TAKE 1 TABLET BY MOUTH  DAILY, Disp: 90 tablet, Rfl:  3   metFORMIN (GLUCOPHAGE) 1000 MG tablet, TAKE 1 TABLET BY MOUTH  TWICE DAILY WITH MEALS, Disp: 180 tablet, Rfl: 3   omeprazole (PRILOSEC) 20 MG capsule, TAKE 1 CAPSULE BY MOUTH IN THE MORNING AND AT BEDTIME., Disp: 180 capsule, Rfl: 3   rizatriptan (MAXALT) 10 MG tablet, TAKE ONE TABLET DAILY AS NEEDED FOR MIGRAINE MAY REPEAT IN 2 HOURS IF NEEDED, Disp: 18 tablet, Rfl: 3   tadalafil (CIALIS) 20 MG tablet, Take 0.5-1 tablets (10-20 mg total) by mouth every other day as needed for erectile dysfunction.,  Disp: 5 tablet, Rfl: 11   IBU 800 MG tablet, Take 800 mg by mouth every 8 (eight) hours., Disp: , Rfl:    Semaglutide, 2 MG/DOSE, 8 MG/3ML SOPN, Inject into the skin., Disp: , Rfl:    triamcinolone (KENALOG) 0.1 %, Apply topically 2 (two) times daily. (Patient not taking: Reported on 03/22/2022), Disp: , Rfl:   Vitals   Vitals:   03/21/22 2343 03/22/22 0329 03/22/22 0433 03/22/22 0800  BP: 124/76 109/77 111/83 112/80  Pulse: 88 87 82 98  Resp: '18 17 18 '$ (!) 24  Temp: 98.5 F (36.9 C) 98.1 F (36.7 C) 98 F (36.7 C) 98.2 F (36.8 C)  TempSrc: Oral Oral Oral Oral  SpO2: 99% 99% 99% 97%  Weight:      Height:         Body mass index is 28.57 kg/m.  Physical Exam   Physical Exam Gen: A&O x4, NAD HEENT: Atraumatic, normocephalic;mucous membranes moist; oropharynx clear, tongue without atrophy or fasciculations. Neck: Supple, trachea midline. Resp: CTAB, no w/r/r CV: RRR, no m/g/r; nml S1 and S2. 2+ symmetric peripheral pulses. Abd: soft/NT/ND; nabs x 4 quad Extrem: Nml bulk; no cyanosis, clubbing, or edema.  Neuro: *MS: A&O x4. Follows multi-step commands.  *Speech: fluid, nondysarthric, able to name and repeat *CN:    I: Deferred   II,III: PERRLA, VFF by confrontation, optic discs unable to be visualized 2/2 pupillary constriction   III,IV,VI: EOMI w/o nystagmus, no ptosis   V: Sensation intact from V1 to V3 to LT   VII: Eyelid closure was full.  Smile symmetric.   VIII:  Hearing intact to voice   IX,X: Voice normal, palate elevates symmetrically    XI: SCM/trap 5/5 bilat   XII: Tongue protrudes midline, no atrophy or fasciculations   *Motor:   Normal bulk.  No tremor, rigidity or bradykinesia. No pronator drift.    Strength: Dlt Bic Tri WrE WrF FgS Gr HF KnF KnE PlF DoF    Left '5 5 5 5 5 5 5 5 5 5 5 5    '$ Right '5 5 5 5 5 5 5 5 5 5 5 5    '$ *Sensory: Intact to light touch, pinprick, temperature vibration throughout. Symmetric. Propioception intact bilat.  No double-simultaneous extinction.  *Coordination:  Finger-to-nose, heel-to-shin, rapid alternating motions were intact. *Reflexes:  2+ and symmetric throughout without clonus; toes down-going bilat *Gait: deferred  NIHSS = 0  Premorbid mRS = 0   Labs   CBC:  Recent Labs  Lab 03/21/22 1929  WBC 8.2  NEUTROABS 5.0  HGB 14.5  HCT 41.1  MCV 90.3  PLT 563    Basic Metabolic Panel:  Lab Results  Component Value Date   NA 131 (L) 03/21/2022   K 4.2 03/21/2022   CO2 24 03/21/2022   GLUCOSE 153 (H) 03/21/2022   BUN 15 03/21/2022   CREATININE 1.02 03/21/2022   CALCIUM 8.8 (L) 03/21/2022   GFRNONAA >60 03/21/2022   GFRAA 74 02/17/2020   Lipid Panel:  Lab Results  Component Value Date   LDLCALC 71 11/01/2021   HgbA1c:  Lab Results  Component Value Date   HGBA1C 6.8 (A) 11/01/2021   Urine Drug Screen: No results found for: "LABOPIA", "COCAINSCRNUR", "LABBENZ", "AMPHETMU", "THCU", "LABBARB"  Alcohol Level     Component Value Date/Time   ETH <10 03/21/2022 1929     Impression   This is a 61 year old gentleman with past medical history significant for diabetes, hypertension, hyperlipidemia, migraines, sleep  apnea who presented to the ED for evaluation after an episode of transient vertigo, nausea, and ataxia, now resolved. NIHSS currently 0. No e/o acute infarct on MRI. He has high-grade stenosis with radiographic string sign in R ICA and L vert as well as high-grade stenosis in L  carotid approaching radiographic string sign. I reviewed the case and images with Dr. Jeanell Sparrow of [facility] who recommended transfer to [facility] for catheter angiogram and possible stenting tomorrow.  Recommendations   - Transfer to [facility] today as soon as a monitored bed is available in anticipation of diagnostic and potentially therapeutic catheter angiogram tomorrow - Permissive HTN up to 180/105 in the setting of severe multifocal extracranial cerebrovasculature. PRN labetalol or hydralazine if BP above these parameters. Avoid oral antihypertensives. Strict avoidance of hypotension - TTE pending - ASA '81mg'$  daily + plavix '75mg'$  daily per IR - Atorvastatin '80mg'$  daily - NPO at MN - q4 hr neuro checks - STAT head CT and activate stroke code for any change in neuro exam - Tele - PT/OT/SLP - Stroke education - Amb referral to neurology upon discharge   Patient will be admitted to the hospitalist service at [facility]. Admitting MD should page [facility] neurohospitalist on patient arrival.  ______________________________________________________________________   Thank you for the opportunity to take part in the care of this patient. If you have any further questions, please contact the neurology consultation attending.  Signed,  Su Monks, MD [facility] [phone]  If 7pm- 7am, please page neurology on call as listed in [place].

## 2022-03-22 NOTE — Progress Notes (Signed)
SLP Cancellation Note  Patient Details Name: Calvin Smith MRN: [id number] DOB: 1960/09/16   Cancelled treatment:       Reason Eval/Treat Not Completed: Other (comment)  Nurse just notified ST that pt is an immediate transfer to [facility]. Neurology instructed to d/c order.   Calvin Smith, M.S., CCC-SLP, [place] Pathologist Certified Brain Injury [place]  [place] Office [phone] Ascom [phone] Fax [phone]  Calvin Smith 03/22/2022, 11:31 AM

## 2022-03-22 NOTE — Assessment & Plan Note (Signed)
Allow permissive hypertension 

## 2022-03-22 NOTE — Assessment & Plan Note (Signed)
Patient has a history of type 2 diabetes mellitus on insulin and multiple oral agents. Hold oral hypoglycemic agents for now Continue long-acting insulin at one half the dose Place patient on a consistent carbohydrate diet once his swallow screen has been done Sliding scale insulin

## 2022-03-22 NOTE — ED Notes (Signed)
Report given to [facility] who sts they will be on their way to pick up pt. Report called to [place] RN.

## 2022-03-22 NOTE — Assessment & Plan Note (Addendum)
Hold lisinopril for now Allow for permissive hypertension

## 2022-03-22 NOTE — Assessment & Plan Note (Signed)
-   will admit based on TIA/CVA protocol        Monitor on Tele    /MRI Resulted - showing no acute ischemic CVA        CTA showed significant stenosis Patient was started on aspirin Plavix and high intensity statin       Echo to evaluate for possible embolic source was ordered       obtain cardiac enzymes,  ECG,   Lipid panel, TSH.        Order PT/OT evaluation.            Will make sure patient is on antiplatelet ASA 81 Plavix agent and statin        Allow permissive Hypertension keep BP <220/120        Neurology consulted will see patient in neuro on arrival to [facility]

## 2022-03-22 NOTE — Assessment & Plan Note (Signed)
Continue Lipitor 80 mg p.o. daily check lipid panel

## 2022-03-22 NOTE — Assessment & Plan Note (Signed)
Stable.  Continue PPI. 

## 2022-03-22 NOTE — ED Notes (Signed)
[facility] at bedside 

## 2022-03-22 NOTE — ED Provider Notes (Signed)
[facility] Provider Note    Event Date/Time   First MD Initiated Contact with Patient 03/22/22 0403     (approximate)   History   Dizziness   HPI  Calvin Smith is a 61 y.o. male who presents to the ED for evaluation of Dizziness   I reviewed PCP visit from 3/28. History of DM, HLD, HTN.  61 year old male presents to the ED for evaluation of an episode of staggering unsteady gait that has since resolved.  He reports that this afternoon he was standing and cleaning his pool.  He had been remaining in a standing position, using a net to clean leaves out of his pool when he began walking around the pool and felt like he was falling to the side.  Reports about 20 minutes of unsteadiness, poor gait, nausea, diaphoresis and dizziness.  His wife found him lying on the couch towards the end of this episode and noted that he was diaphoretic and pale.  Agree that he looks better now.  Reports that he has been asymptomatic since that time, but has never had an episode like this before.   Physical Exam   Triage Vital Signs: ED Triage Vitals  Enc Vitals Group     BP 03/21/22 1933 120/67     Pulse Rate 03/21/22 1933 96     Resp 03/21/22 1933 16     Temp 03/21/22 1933 98.8 F (37.1 C)     Temp Source 03/21/22 1933 Oral     SpO2 03/21/22 1933 99 %     Weight 03/21/22 1926 177 lb (80.3 kg)     Height 03/21/22 1926 '5\' 6"'$  (1.676 m)     Head Circumference --      Peak Flow --      Pain Score 03/21/22 1926 0     Pain Loc --      Pain Edu? --      Excl. in [place]? --     Most recent vital signs: Vitals:   03/22/22 0329 03/22/22 0433  BP: 109/77 111/83  Pulse: 87 82  Resp: 17 18  Temp: 98.1 F (36.7 C) 98 F (36.7 C)  SpO2: 99% 99%    General: Awake, no distress.  CV:  Good peripheral perfusion.  Resp:  Normal effort.  Abd:  No distention.  MSK:  No deformity noted.  Neuro:  No focal deficits appreciated. Cranial nerves II through XII intact 5/5  strength and sensation in all 4 extremities Other:     ED Results / Procedures / Treatments   Labs (all labs ordered are listed, but only abnormal results are displayed) Labs Reviewed  COMPREHENSIVE METABOLIC PANEL - Abnormal; Notable for the following components:      Result Value   Sodium 131 (*)    Glucose, Bld 153 (*)    Calcium 8.8 (*)    Total Bilirubin 1.8 (*)    All other components within normal limits  CBG MONITORING, ED - Abnormal; Notable for the following components:   Glucose-Capillary 146 (*)    All other components within normal limits  PROTIME-INR  APTT  CBC  DIFFERENTIAL  ETHANOL  TROPONIN I (HIGH SENSITIVITY)  TROPONIN I (HIGH SENSITIVITY)    EKG Sinus rhythm with a rate of 99 bpm.  Axis and intervals.  No clear signs of acute ischemia.  Nonspecific ST changes to lead III  RADIOLOGY CT head interpreted by me without evidence of acute intracranial pathology  Official radiology  report(s): CT ANGIO HEAD NECK W WO CM  Result Date: 03/22/2022 CLINICAL DATA:  61 year old male TIA.  Dizziness. EXAM: CT ANGIOGRAPHY HEAD AND NECK TECHNIQUE: Multidetector CT imaging of the head and neck was performed using the standard protocol during bolus administration of intravenous contrast. Multiplanar CT image reconstructions and MIPs were obtained to evaluate the vascular anatomy. Carotid stenosis measurements (when applicable) are obtained utilizing NASCET criteria, using the distal internal carotid diameter as the denominator. RADIATION DOSE REDUCTION: This exam was performed according to the departmental dose-optimization program which includes automated exposure control, adjustment of the mA and/or kV according to patient size and/or use of iterative reconstruction technique. CONTRAST:  85m OMNIPAQUE IOHEXOL 350 MG/ML SOLN COMPARISON:  Plain head CT 03/21/2022. FINDINGS: CTA NECK Skeleton: No acute osseous abnormality identified. Tympanic cavities and Visualized paranasal  sinuses and mastoids are clear. Upper chest: Negative. Other neck: Motion artifact at the soft palate, oropharynx. No acute neck soft tissue finding identified. Aortic arch: Mild arch atherosclerosis. Three vessel arch configuration. Right carotid system: Minimal brachiocephalic artery origin plaque without stenosis. Normal right CCA in total lateral calcified plaque which is just proximal to the bifurcation not resulting in stenosis (series 4, image 115). Patent carotid bifurcation. Bulky calcified plaque at the right ICA origin and bulb. Radiographic string sign stenosis results as seen on series 6 images 207 through 201, along a segment of up to 16 mm. Right ICA remains patent and is otherwise negative to the skull base. Left carotid system: Mild left CCA origin plaque without stenosis. Bulky calcified plaque beginning at the left carotid bifurcation and continuing into the left ICA origin and bulb with high-grade stenosis just before the left ICA origin on series 6, image 217 approaching a radiographic string sign. Origin and bulb calcified plaque then results in less than 50 % stenosis with respect to the distal vessel. Vertebral arteries: Calcified plaque at the right subclavian artery origin without stenosis. Calcified plaque near the right vertebral artery origin,. Additional right V1 (series 6, images 288 and 273) with at least moderate V1 segment stenosis. But the right vertebral artery remains patent to the skull base with no additional plaque. Proximal left subclavian artery calcified plaque with no significant stenosis but bulky and long segment nearly 2.5 cm calcified plaque throughout the proximal left vertebral artery (series 7, image 128) with high-grade stenosis. However, the left vertebral is mildly dominant and remains patent. No additional plaque or stenosis to the skull base. CTA HEAD Posterior circulation: Mildly dominant left vertebral V4 segment with normal left PICA origin and  vertebrobasilar junction. No distal left vertebral plaque or stenosis. Contralateral right vertebral V4 segment moderate calcified plaque and stenosis on series 6, image 147. But the right vertebral remains patent to the vertebrobasilar junction. The right AICA appears dominant. Patent basilar artery with mild irregularity. No significant basilar stenosis. Patent SCA and PCA origins. Right posterior communicating artery is present. Bilateral PCA branches are patent with mild irregularity. Anterior circulation: Both ICA siphons are patent. Only mild supraclinoid ICA calcified plaque. No significant ICA siphon stenosis. Patent carotid termini. Patent MCA and ACA origins. Normal anterior communicating artery. Bilateral ACA branches are within normal limits. Left MCA M1 segment and bifurcation are patent without stenosis. Left MCA branches appear mildly irregular. Right MCA M1 segment is tortuous and bifurcates early without stenosis. Right MCA branches appear patent with mild irregularity. Venous sinuses: Early contrast timing, grossly patent. Anatomic variants: Mildly dominant left vertebral artery. Review of the MIP images  confirms the above findings IMPRESSION: 1. Negative for large vessel occlusion, but positive for bulky calcified plaque at both carotid bifurcations and the proximal left vertebral artery: - Right ICA origin and bulb RADIOGRAPHIC STRING SIGN. - proximal Left Vertebral Artery RADIOGRAPHIC STRING SIGN. - Left Carotid Bifurcation High-grade Stenosis, approaching a radiographic string sign. 2. Also moderate Right Vertebral Artery V1 and V4 segment stenoses due to plaque. 3. Aortic Atherosclerosis (ICD10-I70.0). Electronically Signed   By: Genevie Ann M.D.   On: 03/22/2022 05:12   CT HEAD WO CONTRAST  Result Date: 03/21/2022 CLINICAL DATA:  Dizziness. EXAM: CT HEAD WITHOUT CONTRAST TECHNIQUE: Contiguous axial images were obtained from the base of the skull through the vertex without intravenous  contrast. RADIATION DOSE REDUCTION: This exam was performed according to the departmental dose-optimization program which includes automated exposure control, adjustment of the mA and/or kV according to patient size and/or use of iterative reconstruction technique. COMPARISON:  None Available. FINDINGS: Brain: No evidence of acute infarction, hemorrhage, hydrocephalus, extra-axial collection or mass lesion/mass effect. Vascular: No hyperdense vessel or unexpected calcification. Skull: Normal. Negative for fracture or focal lesion. Sinuses/Orbits: There is mild right ethmoid sinus mucosal thickening. Other: None. IMPRESSION: No acute intracranial abnormality. Electronically Signed   By: Virgina Norfolk M.D.   On: 03/21/2022 19:58    PROCEDURES and INTERVENTIONS:  Procedures  Medications  iohexol (OMNIPAQUE) 350 MG/ML injection 75 mL (75 mLs Intravenous Contrast Given 03/22/22 0436)     IMPRESSION / MDM / ASSESSMENT AND PLAN / ED COURSE  I reviewed the triage vital signs and the nursing notes.  Differential diagnosis includes, but is not limited to, TIA, stroke, seizure, BPPV, dehydration, orthostasis, cardiac dysrhythmia  {Patient presents with symptoms of an acute illness or injury that is potentially life-threatening.  Pleasant 61 year old male presents to the ED after a transient episode of ataxia, possibly representing a TIA and requiring medical observation admission.  He looks systemically well with reassuring semination now without evidence of neurologic or vascular deficits.  Asymptomatic.  Blood work is benign with normal CBC.  Metabolic panel with marginal hyperglycemia and hyponatremia.  Negative troponins and coagulation panel is normal.  CT scan of his head is without evidence of acute pathology, but follow-up CT angiogram of his neck and head show multiple areas of significant stenosis.  I am concerned about a TIA.  After discussing risks and benefits he is agreeable to stay for an  observation admission which I think is reasonable.  We will consult with medicine.  Clinical Course as of 03/22/22 0654  Wed Mar 22, 2022  0525 Reassessed and discussed angiogram findings and my recommendation for observation admission.  They are agreeable. [DS]  9470 I consult with hospitalist who agrees to admit [DS]    Clinical Course User Index [DS] Vladimir Crofts, MD     FINAL CLINICAL IMPRESSION(S) / ED DIAGNOSES   Final diagnoses:  TIA (transient ischemic attack)  Ataxia     Rx / DC Orders   ED Discharge Orders     None        Note:  This document was prepared using Dragon voice recognition software and may include unintentional dictation errors.   Vladimir Crofts, MD 03/22/22 [phone]

## 2022-03-22 NOTE — ED Notes (Signed)
Pt ambulated to the bathroom w standby assist.

## 2022-03-23 ENCOUNTER — Other Ambulatory Visit: Payer: Self-pay

## 2022-03-23 ENCOUNTER — Observation Stay (HOSPITAL_COMMUNITY): Payer: 59

## 2022-03-23 ENCOUNTER — Encounter (HOSPITAL_COMMUNITY): Payer: Self-pay | Admitting: Internal Medicine

## 2022-03-23 ENCOUNTER — Other Ambulatory Visit (HOSPITAL_COMMUNITY): Payer: Self-pay

## 2022-03-23 DIAGNOSIS — Z8673 Personal history of transient ischemic attack (TIA), and cerebral infarction without residual deficits: Secondary | ICD-10-CM | POA: Diagnosis not present

## 2022-03-23 DIAGNOSIS — M542 Cervicalgia: Secondary | ICD-10-CM | POA: Diagnosis present

## 2022-03-23 DIAGNOSIS — R260 Ataxic gait: Secondary | ICD-10-CM | POA: Diagnosis present

## 2022-03-23 DIAGNOSIS — Z8249 Family history of ischemic heart disease and other diseases of the circulatory system: Secondary | ICD-10-CM | POA: Diagnosis not present

## 2022-03-23 DIAGNOSIS — Z807 Family history of other malignant neoplasms of lymphoid, hematopoietic and related tissues: Secondary | ICD-10-CM | POA: Diagnosis not present

## 2022-03-23 DIAGNOSIS — Z885 Allergy status to narcotic agent status: Secondary | ICD-10-CM | POA: Diagnosis not present

## 2022-03-23 DIAGNOSIS — E1159 Type 2 diabetes mellitus with other circulatory complications: Secondary | ICD-10-CM | POA: Diagnosis present

## 2022-03-23 DIAGNOSIS — G450 Vertebro-basilar artery syndrome: Secondary | ICD-10-CM | POA: Diagnosis present

## 2022-03-23 DIAGNOSIS — Z7902 Long term (current) use of antithrombotics/antiplatelets: Secondary | ICD-10-CM | POA: Diagnosis not present

## 2022-03-23 DIAGNOSIS — Z801 Family history of malignant neoplasm of trachea, bronchus and lung: Secondary | ICD-10-CM | POA: Diagnosis not present

## 2022-03-23 DIAGNOSIS — Z7982 Long term (current) use of aspirin: Secondary | ICD-10-CM | POA: Diagnosis not present

## 2022-03-23 DIAGNOSIS — I10 Essential (primary) hypertension: Secondary | ICD-10-CM | POA: Diagnosis present

## 2022-03-23 DIAGNOSIS — K219 Gastro-esophageal reflux disease without esophagitis: Secondary | ICD-10-CM | POA: Diagnosis present

## 2022-03-23 DIAGNOSIS — R262 Difficulty in walking, not elsewhere classified: Secondary | ICD-10-CM | POA: Diagnosis present

## 2022-03-23 DIAGNOSIS — Z79899 Other long term (current) drug therapy: Secondary | ICD-10-CM | POA: Diagnosis not present

## 2022-03-23 DIAGNOSIS — E119 Type 2 diabetes mellitus without complications: Secondary | ICD-10-CM | POA: Diagnosis not present

## 2022-03-23 DIAGNOSIS — E1165 Type 2 diabetes mellitus with hyperglycemia: Secondary | ICD-10-CM | POA: Diagnosis present

## 2022-03-23 DIAGNOSIS — G43909 Migraine, unspecified, not intractable, without status migrainosus: Secondary | ICD-10-CM | POA: Diagnosis present

## 2022-03-23 DIAGNOSIS — G8929 Other chronic pain: Secondary | ICD-10-CM | POA: Diagnosis present

## 2022-03-23 DIAGNOSIS — I6502 Occlusion and stenosis of left vertebral artery: Secondary | ICD-10-CM | POA: Diagnosis not present

## 2022-03-23 DIAGNOSIS — Z7984 Long term (current) use of oral hypoglycemic drugs: Secondary | ICD-10-CM | POA: Diagnosis not present

## 2022-03-23 DIAGNOSIS — G4733 Obstructive sleep apnea (adult) (pediatric): Secondary | ICD-10-CM | POA: Diagnosis present

## 2022-03-23 DIAGNOSIS — G459 Transient cerebral ischemic attack, unspecified: Secondary | ICD-10-CM | POA: Diagnosis not present

## 2022-03-23 DIAGNOSIS — Z91199 Patient's noncompliance with other medical treatment and regimen due to unspecified reason: Secondary | ICD-10-CM | POA: Diagnosis not present

## 2022-03-23 DIAGNOSIS — Z8049 Family history of malignant neoplasm of other genital organs: Secondary | ICD-10-CM | POA: Diagnosis not present

## 2022-03-23 DIAGNOSIS — E785 Hyperlipidemia, unspecified: Secondary | ICD-10-CM | POA: Diagnosis present

## 2022-03-23 DIAGNOSIS — R61 Generalized hyperhidrosis: Secondary | ICD-10-CM | POA: Diagnosis present

## 2022-03-23 HISTORY — PX: IR ANGIO VERTEBRAL SEL SUBCLAVIAN INNOMINATE BILAT MOD SED: IMG5366

## 2022-03-23 HISTORY — PX: IR ANGIO INTRA EXTRACRAN SEL COM CAROTID INNOMINATE BILAT MOD SED: IMG5360

## 2022-03-23 HISTORY — PX: IR US GUIDE VASC ACCESS RIGHT: IMG2390

## 2022-03-23 LAB — TSH: TSH: 2.541 u[IU]/mL (ref 0.350–4.500)

## 2022-03-23 LAB — GLUCOSE, CAPILLARY
Glucose-Capillary: 112 mg/dL — ABNORMAL HIGH (ref 70–99)
Glucose-Capillary: 120 mg/dL — ABNORMAL HIGH (ref 70–99)
Glucose-Capillary: 129 mg/dL — ABNORMAL HIGH (ref 70–99)
Glucose-Capillary: 132 mg/dL — ABNORMAL HIGH (ref 70–99)
Glucose-Capillary: 132 mg/dL — ABNORMAL HIGH (ref 70–99)
Glucose-Capillary: 179 mg/dL — ABNORMAL HIGH (ref 70–99)

## 2022-03-23 LAB — COMPREHENSIVE METABOLIC PANEL
ALT: 40 U/L (ref 0–44)
AST: 30 U/L (ref 15–41)
Albumin: 3.4 g/dL — ABNORMAL LOW (ref 3.5–5.0)
Alkaline Phosphatase: 57 U/L (ref 38–126)
Anion gap: 8 (ref 5–15)
BUN: 10 mg/dL (ref 8–23)
CO2: 22 mmol/L (ref 22–32)
Calcium: 8.5 mg/dL — ABNORMAL LOW (ref 8.9–10.3)
Chloride: 100 mmol/L (ref 98–111)
Creatinine, Ser: 0.99 mg/dL (ref 0.61–1.24)
GFR, Estimated: >60 mL/min (ref >60)
Glucose, Bld: 100 mg/dL — ABNORMAL HIGH (ref 70–99)
Potassium: 3.9 mmol/L (ref 3.5–5.1)
Sodium: 130 mmol/L — ABNORMAL LOW (ref 135–145)
Total Bilirubin: 1.5 mg/dL — ABNORMAL HIGH (ref 0.3–1.2)
Total Protein: 6.3 g/dL — ABNORMAL LOW (ref 6.5–8.1)

## 2022-03-23 LAB — RAPID URINE DRUG SCREEN, HOSP PERFORMED
Amphetamines: NOT DETECTED
Barbiturates: NOT DETECTED
Benzodiazepines: NOT DETECTED
Cocaine: NOT DETECTED
Opiates: NOT DETECTED
Tetrahydrocannabinol: NOT DETECTED

## 2022-03-23 LAB — LIPID PANEL
Cholesterol: 113 mg/dL (ref 0–200)
HDL: 32 mg/dL — ABNORMAL LOW (ref >40)
LDL Cholesterol: 62 mg/dL (ref 0–99)
Total CHOL/HDL Ratio: 3.5 RATIO
Triglycerides: 95 mg/dL (ref <150)
VLDL: 19 mg/dL (ref 0–40)

## 2022-03-23 LAB — CBC
HCT: 38.9 % — ABNORMAL LOW (ref 39.0–52.0)
Hemoglobin: 14.2 g/dL (ref 13.0–17.0)
MCH: 32.4 pg (ref 26.0–34.0)
MCHC: 36.5 g/dL — ABNORMAL HIGH (ref 30.0–36.0)
MCV: 88.8 fL (ref 80.0–100.0)
Platelets: 194 10*3/uL (ref 150–400)
RBC: 4.38 MIL/uL (ref 4.22–5.81)
RDW: 12 % (ref 11.5–15.5)
WBC: 8.1 10*3/uL (ref 4.0–10.5)
nRBC: 0 % (ref 0.0–0.2)

## 2022-03-23 LAB — HIV ANTIBODY (ROUTINE TESTING W REFLEX): HIV Screen 4th Generation wRfx: NONREACTIVE

## 2022-03-23 MED ORDER — FENTANYL CITRATE (PF) 100 MCG/2ML IJ SOLN
INTRAMUSCULAR | Status: AC
  Filled 2022-03-23: qty 2

## 2022-03-23 MED ORDER — IOHEXOL 300 MG/ML  SOLN
100.0000 mL | Freq: Once | INTRAMUSCULAR | Status: AC | PRN
  Administered 2022-03-23: 100 mL via INTRA_ARTERIAL

## 2022-03-23 MED ORDER — TICAGRELOR 90 MG PO TABS: 180.0000 mg | ORAL_TABLET | Freq: Once | ORAL | Status: DC

## 2022-03-23 MED ORDER — CLOPIDOGREL BISULFATE 75 MG PO TABS
75.0000 mg | ORAL_TABLET | Freq: Every day | ORAL | Status: DC
  Administered 2022-03-24 – 2022-03-25 (×2): 75 mg via ORAL
  Filled 2022-03-23 (×2): qty 1

## 2022-03-23 MED ORDER — MIDAZOLAM HCL 2 MG/2ML IJ SOLN
INTRAMUSCULAR | Status: AC | PRN
  Administered 2022-03-23: 1 mg via INTRAVENOUS
  Administered 2022-03-23 (×2): .5 mg via INTRAVENOUS

## 2022-03-23 MED ORDER — SUMATRIPTAN SUCCINATE 25 MG PO TABS
25.0000 mg | ORAL_TABLET | ORAL | Status: DC | PRN
  Administered 2022-03-23 (×2): 25 mg via ORAL
  Filled 2022-03-23 (×3): qty 1

## 2022-03-23 MED ORDER — TICAGRELOR 90 MG PO TABS: 90.0000 mg | ORAL_TABLET | Freq: Two times a day (BID) | ORAL | Status: DC

## 2022-03-23 MED ORDER — CLOPIDOGREL BISULFATE 75 MG PO TABS
225.0000 mg | ORAL_TABLET | Freq: Once | ORAL | Status: AC
  Administered 2022-03-23: 225 mg via ORAL
  Filled 2022-03-23: qty 3

## 2022-03-23 MED ORDER — LIDOCAINE HCL 1 % IJ SOLN
INTRAMUSCULAR | Status: AC
  Filled 2022-03-23: qty 20

## 2022-03-23 MED ORDER — MIDAZOLAM HCL 2 MG/2ML IJ SOLN
INTRAMUSCULAR | Status: AC
  Filled 2022-03-23: qty 4

## 2022-03-23 MED ORDER — FENTANYL CITRATE (PF) 100 MCG/2ML IJ SOLN
INTRAMUSCULAR | Status: AC | PRN
  Administered 2022-03-23 (×2): 25 ug via INTRAVENOUS

## 2022-03-23 MED ORDER — HEPARIN SODIUM (PORCINE) 1000 UNIT/ML IJ SOLN
INTRAMUSCULAR | Status: AC
  Filled 2022-03-23: qty 3

## 2022-03-23 MED ORDER — IOHEXOL 300 MG/ML  SOLN
100.0000 mL | Freq: Once | INTRAMUSCULAR | Status: AC | PRN
  Administered 2022-03-23: 6 mL via INTRA_ARTERIAL

## 2022-03-23 MED ORDER — ACETAMINOPHEN 500 MG PO TABS
1000.0000 mg | ORAL_TABLET | Freq: Once | ORAL | Status: AC
  Administered 2022-03-24: 1000 mg via ORAL
  Filled 2022-03-23: qty 2

## 2022-03-23 MED ORDER — LIDOCAINE HCL (PF) 1 % IJ SOLN
INTRAMUSCULAR | Status: AC | PRN
  Administered 2022-03-23: 10 mL

## 2022-03-23 NOTE — Progress Notes (Signed)
OT Cancellation Note  Patient Details Name: Calvin Smith MRN: [id number] DOB: Dec 27, 1960   Cancelled Treatment:    Reason Eval/Treat Not Completed: OT screened, no needs identified, will sign off (Pt reporting he feel back to baseline (besides headaches). Per PT report and evaluation, pt performing near baseline for mobility. Pt and his wife feel comfortable with dc to home.)  [place], OTR/L Acute Rehab Office: [phone] 03/23/2022, 3:01 PM

## 2022-03-23 NOTE — Progress Notes (Signed)
Chief Complaint: Patient was seen in consultation today for TIA  Referring Physician(s): Agbata,Tochukwu  Supervising Physician: Pedro Earls  Patient Status: [facility] - In-pt  History of Present Illness: Calvin Smith is a 61 y.o. male with past medical history of DM2, HTN, HLD, migraine headaches, sleep apnea who presented to [facility] ED after episode of unsteady gait at home. He underwent stroke work-up and was transferred to [facility] for possible intervention.   CTA Head/Neck: 1. Negative for large vessel occlusion, but positive for bulky calcified plaque at both carotid bifurcations and the proximal left vertebral artery: - Right ICA origin and bulb RADIOGRAPHIC STRING SIGN. - proximal Left Vertebral Artery RADIOGRAPHIC STRING SIGN. - Left Carotid Bifurcation High-grade Stenosis, approaching a radiographic string sign. 2. Also moderate Right Vertebral Artery V1 and V4 segment stenoses due to plaque. 3. Aortic Atherosclerosis (ICD10-I70.0).  Patient assessed at bedside. He has returned to baseline and denies ongoing neuro deficits.  Several family members at bedside.  He is understanding of the goals of the procedure, the plan to proceed with proceed with diagnostic angiogram, and is agreeable.    Past Medical History:  Diagnosis Date   Allergic rhinitis    Arm fracture, left as a child   DeQuervain's disease (tenosynovitis)    right wrist   Diabetes mellitus    type 2   Erectile dysfunction    Hyperlipidemia    Hypertension    Migraines    Sleep apnea     Past Surgical History:  Procedure Laterality Date   Cardiolite  5/06   Negative EF 66%   CARPAL TUNNEL RELEASE  9/03   left    WRIST SURGERY  1/04   DeQuervain release   WRIST SURGERY  1/08   ORIF l wrist DVR plate screwhead autologous    Allergies: Bisoprolol-hydrochlorothiazide and Codeine  Medications: Prior to Admission medications   Medication Sig Start Date End Date Taking?  Authorizing Provider  aspirin EC 81 MG tablet Take 81 mg by mouth daily.   Yes [provider]  atorvastatin (LIPITOR) 80 MG tablet Take 1 tablet (80 mg total) by mouth daily. 03/23/22 06/21/22 Yes Agbata, Tochukwu, MD  empagliflozin (JARDIANCE) 25 MG TABS tablet Take 25 mg by mouth daily.   Yes [provider]  glipiZIDE (GLUCOTROL XL) 5 MG 24 hr tablet Take 5 mg by mouth in the morning and at bedtime.   Yes [provider]  ibuprofen (ADVIL) 200 MG tablet Take 400-600 mg by mouth daily as needed for headache.   Yes [provider]  LANTUS SOLOSTAR 100 UNIT/ML Solostar Pen INJECT SUBCUTANEOUSLY 90  UNITS AT BEDTIME Patient taking differently: 45 Units 2 (two) times daily. 03/01/22  Yes Viviana Simpler I, MD  lisinopril (ZESTRIL) 20 MG tablet Take 20 mg by mouth daily.   Yes [provider]  metFORMIN (GLUCOPHAGE) 1000 MG tablet Take 1,000 mg by mouth 2 (two) times daily with a meal.   Yes [provider]  omeprazole (PRILOSEC) 20 MG capsule TAKE 1 CAPSULE BY MOUTH IN THE MORNING AND AT BEDTIME. Patient taking differently: Take 20 mg by mouth 2 (two) times daily before a meal. 10/03/21  Yes [name], MD  Semaglutide, 2 MG/DOSE, (OZEMPIC, 2 MG/DOSE,) 8 MG/3ML SOPN Inject 2 mg into the skin once a week.   Yes [provider]  clopidogrel (PLAVIX) 75 MG tablet Take 1 tablet (75 mg total) by mouth daily. Patient not taking: Reported on 03/23/2022 03/23/22  04/22/22  Collier Bullock, MD     Family History  Problem Relation Age of Onset   Hypertension Father    Lymphoma Father        non-hodgkins   Uterine cancer Maternal Grandmother    Hypertension Paternal Grandfather    Lung cancer Paternal Aunt    Coronary artery disease Paternal Uncle    Colon cancer Unknown        in 1 relative (?aunt)    Social History   Socioeconomic History   Marital status: Married    Spouse name: Not on file   Number of children: Not on file    Years of education: Not on file   Highest education level: Not on file  Occupational History   Occupation: Buyer, 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  Tobacco Use   Smoking status: Never   Smokeless tobacco: Never  Vaping Use   Vaping Use: Never used  Substance and Sexual Activity   Alcohol use: Yes    Comment: rare   Drug use: No   Sexual activity: Yes    Partners: Female  Other Topics Concern   Not on file  Social History Narrative   Regular exercise: light to moderate   Caffeine use: coffee daily   Social Determinants of Radio broadcast assistant Strain: Not on file  Food Insecurity: Not on file  Transportation Needs: Not on file  Physical Activity: Not on file  Stress: Not on file  Social Connections: Not on file     Review of Systems: A 12 point ROS discussed and pertinent positives are indicated in the HPI above.  All other systems are negative.  Review of Systems  Constitutional:  Negative for fatigue and fever.  Respiratory:  Negative for cough and shortness of breath.   Cardiovascular:  Negative for chest pain.  Gastrointestinal:  Negative for abdominal pain, nausea and vomiting.  Musculoskeletal:  Negative for back pain.  Psychiatric/Behavioral:  Negative for behavioral problems and confusion.     Vital Signs: BP 131/81 (BP Location: Left Arm)   Pulse 81   Temp 98.4 F (36.9 C) (Oral)   Resp 19   Ht '5\' 6"'$  (1.676 m)   Wt 187 lb (84.8 kg)   SpO2 99%   BMI 30.18 kg/m   Physical Exam Vitals and nursing note reviewed.  Constitutional:      General: He is not in acute distress.    Appearance: Normal appearance. He is not ill-appearing.  HENT:     Mouth/Throat:     Mouth: Mucous membranes are moist.     Pharynx: Oropharynx is clear.  Cardiovascular:     Rate and Rhythm: Normal rate and regular rhythm.  Pulmonary:     Effort: Pulmonary effort is normal. No respiratory distress.     Breath sounds: Normal breath sounds.   Abdominal:     General: Abdomen is flat.     Palpations: Abdomen is soft.  Skin:    General: Skin is warm and dry.  Neurological:     General: No focal deficit present.     Mental Status: He is alert and oriented to person, place, and time. Mental status is at baseline.      MD Evaluation Airway: WNL Heart: WNL Abdomen: WNL Chest/ Lungs: WNL ASA  Classification: 3 Mallampati/Airway Score: Two   Imaging: DG CHEST PORT 1 VIEW  Result Date: 03/22/2022 CLINICAL DATA:  Recent TIA EXAM: PORTABLE CHEST 1 VIEW  COMPARISON:  10/17/2006 FINDINGS: The heart size and mediastinal contours are within normal limits. Both lungs are clear. The visualized skeletal structures are unremarkable. IMPRESSION: No active disease. Electronically Signed   By: Inez Catalina M.D.   On: 03/22/2022 22:38   MR BRAIN WO CONTRAST  Result Date: 03/22/2022 CLINICAL DATA:  61 year old male TIA. Dizziness. Unsteady gait. Neurologic deficit. EXAM: MRI HEAD WITHOUT CONTRAST TECHNIQUE: Multiplanar, multiecho pulse sequences of the brain and surrounding structures were obtained without intravenous contrast. COMPARISON:  CTA head and neck earlier today. FINDINGS: Brain: No restricted diffusion to suggest acute infarction. No midline shift, mass effect, evidence of mass lesion, ventriculomegaly, extra-axial collection or acute intracranial hemorrhage. Cervicomedullary junction and pituitary are within normal limits. Scattered small subcortical white matter T2 and FLAIR hyperintense foci, mild to moderate for age. Occasional periventricular involvement. No cortical encephalomalacia or chronic cerebral blood products identified. Deep gray nuclei, brainstem, and cerebellum are normal for age. Vascular: Major intracranial vascular flow voids are preserved. Skull and upper cervical spine: Negative. Visualized bone marrow signal is within normal limits. Sinuses/Orbits: Negative. Other: Mastoids are clear. Grossly negative visible  internal auditory structures. Normal stylomastoid foramina. Negative visible scalp and face. IMPRESSION: 1. No acute intracranial abnormality. 2. Mild to moderate for age nonspecific cerebral white matter signal changes, most commonly due to chronic small vessel disease. Electronically Signed   By: Genevie Ann M.D.   On: 03/22/2022 09:19   CT ANGIO HEAD NECK W WO CM  Result Date: 03/22/2022 CLINICAL DATA:  61 year old male TIA.  Dizziness. EXAM: CT ANGIOGRAPHY HEAD AND NECK TECHNIQUE: Multidetector CT imaging of the head and neck was performed using the standard protocol during bolus administration of intravenous contrast. Multiplanar CT image reconstructions and MIPs were obtained to evaluate the vascular anatomy. Carotid stenosis measurements (when applicable) are obtained utilizing NASCET criteria, using the distal internal carotid diameter as the denominator. RADIATION DOSE REDUCTION: This exam was performed according to the departmental dose-optimization program which includes automated exposure control, adjustment of the mA and/or kV according to patient size and/or use of iterative reconstruction technique. CONTRAST:  57m OMNIPAQUE IOHEXOL 350 MG/ML SOLN COMPARISON:  Plain head CT 03/21/2022. FINDINGS: CTA NECK Skeleton: No acute osseous abnormality identified. Tympanic cavities and Visualized paranasal sinuses and mastoids are clear. Upper chest: Negative. Other neck: Motion artifact at the soft palate, oropharynx. No acute neck soft tissue finding identified. Aortic arch: Mild arch atherosclerosis. Three vessel arch configuration. Right carotid system: Minimal brachiocephalic artery origin plaque without stenosis. Normal right CCA in total lateral calcified plaque which is just proximal to the bifurcation not resulting in stenosis (series 4, image 115). Patent carotid bifurcation. Bulky calcified plaque at the right ICA origin and bulb. Radiographic string sign stenosis results as seen on series 6 images  207 through 201, along a segment of up to 16 mm. Right ICA remains patent and is otherwise negative to the skull base. Left carotid system: Mild left CCA origin plaque without stenosis. Bulky calcified plaque beginning at the left carotid bifurcation and continuing into the left ICA origin and bulb with high-grade stenosis just before the left ICA origin on series 6, image 217 approaching a radiographic string sign. Origin and bulb calcified plaque then results in less than 50 % stenosis with respect to the distal vessel. Vertebral arteries: Calcified plaque at the right subclavian artery origin without stenosis. Calcified plaque near the right vertebral artery origin,. Additional right V1 (series 6, images 288 and 273) with at least moderate  V1 segment stenosis. But the right vertebral artery remains patent to the skull base with no additional plaque. Proximal left subclavian artery calcified plaque with no significant stenosis but bulky and long segment nearly 2.5 cm calcified plaque throughout the proximal left vertebral artery (series 7, image 128) with high-grade stenosis. However, the left vertebral is mildly dominant and remains patent. No additional plaque or stenosis to the skull base. CTA HEAD Posterior circulation: Mildly dominant left vertebral V4 segment with normal left PICA origin and vertebrobasilar junction. No distal left vertebral plaque or stenosis. Contralateral right vertebral V4 segment moderate calcified plaque and stenosis on series 6, image 147. But the right vertebral remains patent to the vertebrobasilar junction. The right AICA appears dominant. Patent basilar artery with mild irregularity. No significant basilar stenosis. Patent SCA and PCA origins. Right posterior communicating artery is present. Bilateral PCA branches are patent with mild irregularity. Anterior circulation: Both ICA siphons are patent. Only mild supraclinoid ICA calcified plaque. No significant ICA siphon stenosis.  Patent carotid termini. Patent MCA and ACA origins. Normal anterior communicating artery. Bilateral ACA branches are within normal limits. Left MCA M1 segment and bifurcation are patent without stenosis. Left MCA branches appear mildly irregular. Right MCA M1 segment is tortuous and bifurcates early without stenosis. Right MCA branches appear patent with mild irregularity. Venous sinuses: Early contrast timing, grossly patent. Anatomic variants: Mildly dominant left vertebral artery. Review of the MIP images confirms the above findings IMPRESSION: 1. Negative for large vessel occlusion, but positive for bulky calcified plaque at both carotid bifurcations and the proximal left vertebral artery: - Right ICA origin and bulb RADIOGRAPHIC STRING SIGN. - proximal Left Vertebral Artery RADIOGRAPHIC STRING SIGN. - Left Carotid Bifurcation High-grade Stenosis, approaching a radiographic string sign. 2. Also moderate Right Vertebral Artery V1 and V4 segment stenoses due to plaque. 3. Aortic Atherosclerosis (ICD10-I70.0). Electronically Signed   By: Genevie Ann M.D.   On: 03/22/2022 05:12   CT HEAD WO CONTRAST  Result Date: 03/21/2022 CLINICAL DATA:  Dizziness. EXAM: CT HEAD WITHOUT CONTRAST TECHNIQUE: Contiguous axial images were obtained from the base of the skull through the vertex without intravenous contrast. RADIATION DOSE REDUCTION: This exam was performed according to the departmental dose-optimization program which includes automated exposure control, adjustment of the mA and/or kV according to patient size and/or use of iterative reconstruction technique. COMPARISON:  None Available. FINDINGS: Brain: No evidence of acute infarction, hemorrhage, hydrocephalus, extra-axial collection or mass lesion/mass effect. Vascular: No hyperdense vessel or unexpected calcification. Skull: Normal. Negative for fracture or focal lesion. Sinuses/Orbits: There is mild right ethmoid sinus mucosal thickening. Other: None. IMPRESSION: No  acute intracranial abnormality. Electronically Signed   By: Virgina Norfolk M.D.   On: 03/21/2022 19:58    Labs:  CBC: Recent Labs    11/01/21 1143 03/21/22 1929 03/22/22 2220 03/23/22 0240  WBC 7.5 8.2 7.0 8.1  HGB 15.5 14.5 14.7 14.2  HCT 44.1 41.1 40.1 38.9*  PLT 236 204 209 194    COAGS: Recent Labs    03/21/22 1929  INR 1.1  APTT 28    BMP: Recent Labs    11/01/21 1143 03/21/22 1929 03/22/22 2220 03/23/22 0240  NA 138 131* 129* 130*  K 4.3 4.2 4.0 3.9  CL 97 98 98 100  CO2 '22 24 23 22  '$ GLUCOSE 54* 153* 171* 100*  BUN '13 15 11 10  '$ CALCIUM 9.2 8.8* 8.5* 8.5*  CREATININE 1.08 1.02 1.03 0.99  GFRNONAA  --  >60 >  60 >60    LIVER FUNCTION TESTS: Recent Labs    11/01/21 1143 03/21/22 1929 03/22/22 2220 03/23/22 0240  BILITOT 0.7 1.8* 1.8* 1.5*  AST 40 36 33 30  ALT 48* 41 45* 40  ALKPHOS 77 69 60 57  PROT 6.7 7.4 6.6 6.3*  ALBUMIN 4.6 4.3 3.6 3.4*    TUMOR MARKERS: No results for input(s): "AFPTM", "CEA", "CA199", "CHROMGRNA" in the last 8760 hours.  Assessment and Plan: Transient ischemic attack Patient stable this morning.  Family at bedside.  Discussed CT results.  All aware of plan to proceed with diagnostic angiogram today with Dr. Karenann Cai.   He has been NPO.  Resumed Plavix.   Risks and benefits of angio were discussed with the patient including, but not limited to bleeding, infection, vascular injury or contrast induced renal failure.  This interventional procedure involves the use of X-rays and because of the nature of the planned procedure, it is possible that we will have prolonged use of X-ray fluoroscopy.  Potential radiation risks to you include (but are not limited to) the following: - A slightly elevated risk for cancer  several years later in life. This risk is typically less than 0.5% percent. This risk is low in comparison to the normal incidence of human cancer, which is 33% for women and 50% for men according  to the [place]. - Radiation induced injury can include skin redness, resembling a rash, tissue breakdown / ulcers and hair loss (which can be temporary or permanent).   The likelihood of either of these occurring depends on the difficulty of the procedure and whether you are sensitive to radiation due to previous procedures, disease, or genetic conditions.   IF your procedure requires a prolonged use of radiation, you will be notified and given written instructions for further action.  It is your responsibility to monitor the irradiated area for the 2 weeks following the procedure and to notify your physician if you are concerned that you have suffered a radiation induced injury.    All of the patient's questions were answered, patient is agreeable to proceed.  Consent signed and in chart.  Thank you for this interesting consult.  I greatly enjoyed meeting Calvin Smith and look forward to participating in their care.  A copy of this report was sent to the requesting provider on this date.  Electronically Signed: Docia Barrier, PA 03/23/2022, 11:30 AM   I spent a total of 40 Minutes    in face to face in clinical consultation, greater than 50% of which was counseling/coordinating care for transient ischemic attack.

## 2022-03-23 NOTE — Progress Notes (Signed)
Physical Therapy Note  Completed BERG balance test with perfect score of 56/56. No focal deficits noted. Pt reports strabismus at baseline, unchanged. Denies any further symptoms other than a mild frontal headache which he reports is not abnormal for him. Patient is functioning at a high level of independence and no physical therapy is indicated at this time. PT is signing-off. Please re-order if there is any significant change in status. Thank you for this referral.  Candie Mile, PT

## 2022-03-23 NOTE — Plan of Care (Signed)
Patient is doing well. Was able to ambulate to restroom with standby assist. VS stable during the shift. PRN pain medicine given for neck pain. Pt is on NPO past  midnight for procedure. Will continue to monitor.  Problem: Pain Managment: Goal: General experience of comfort will improve Outcome: Progressing   Problem: Education: Goal: Knowledge of disease or condition will improve Outcome: Progressing   Problem: Ischemic Stroke/TIA Tissue Perfusion: Goal: Complications of ischemic stroke/TIA will be minimized Outcome: Progressing

## 2022-03-23 NOTE — Procedures (Signed)
INTERVENTIONAL NEURORADIOLOGY BRIEF POSTPROCEDURE NOTE  DIAGNOSTIC CEREBRAL ANGIOGRAM    Attending: Dr. Pedro Earls   Diagnosis: Vertebral and carotid atherosclerotic disease    Access site: Right common femoral artery    Access closure: 5 French ExoSeal, manual pressure    Anesthesia: Moderate sedation    Medication used: 2 mg Versed IV; 50 mcg Fentanyl IV.   Complications: None.    Estimated blood loss: None.    Specimen: None.    Findings:  1. Severe atherosclerotic disease at the origin of the left vertebral artery resulting in approximately 85% stenosis. 2. Severe atherosclerotic disease at the right carotid bifurcation resulting approximately 80% stenosis. 3. Severe atherosclerotic disease at the left carotid bifurcation resulting in approximately 80% stenosis. 4. Atherosclerotic disease at the origin of the right vertebral artery resulting in approximately 70% stenosis.   The patient tolerated the procedure well without incident or complication and is in stable condition.

## 2022-03-23 NOTE — Progress Notes (Signed)
Interventional Radiology Brief Note:  Patient s/p diagnostic angiogram this afternoon with Dr. Karenann Cai with findings of severe atherosclerotic disease in the left vertebral artery, right carotid bifurcation, left carotid bifurcation, and right vertebral artery.  Based on degree of stenosis with recent symptoms plan made to proceed with invention if anesthesia available tomorrow (8/18).  This was discussed thoroughly with the patient and his family at bedside by Dr. Karenann Cai.  All questions answered. Patient and family indicate willingness to proceed.   Loading dose of Plavix '300mg'$  given this afternoon as well as aspirin '81mg'$ .  Patient to get '75mg'$  Plavix tomorrow morning. NPO p MN.   IR will re-visit with patient/family in the AM for consent.   Brynda Greathouse, MS RD PA-C

## 2022-03-23 NOTE — Progress Notes (Addendum)
STROKE TEAM PROGRESS NOTE   INTERVAL HISTORY His family is at the bedside. Patient reports he had sudden onset of ataxic gait, nausea, and vertigo for 20 minutes yesterday prior to coming to ED. Discussed CTA findings and likely etiology of his stroke. Discussed need for diagnostic angiogram. Patient reports he was previously on ASA. Discussed changing to ASA and plavix and continuing on plavix indefinitely. Patient currently denies any ataxia, nausea, or dizziness.   CTA H&N: Negative for large vessel occlusion, but positive for bulky calcified plaque at both carotid bifurcations and the proximal left vertebral artery: - Right ICA origin and bulb RADIOGRAPHIC STRING SIGN. - proximal Left Vertebral Artery RADIOGRAPHIC STRING SIGN. - Left Carotid Bifurcation High-grade Stenosis, approaching a radiographic string sign. 2. Also moderate Right Vertebral Artery V1 and V4 segment stenoses due to plaque. 3. Aortic Atherosclerosis (ICD10-I70.0).  Vitals:   03/23/22 1300 03/23/22 1305 03/23/22 1310 03/23/22 1315  BP: 118/77 113/77 122/85 119/78  Pulse: 87 91 91 89  Resp: '15 16 20 '$ (!) 26  Temp:      TempSrc:      SpO2: 98% 95% 95% 95%  Weight:      Height:       CBC:  Recent Labs  Lab 03/21/22 1929 03/22/22 2220 03/23/22 0240  WBC 8.2 7.0 8.1  NEUTROABS 5.0 4.0  --   HGB 14.5 14.7 14.2  HCT 41.1 40.1 38.9*  MCV 90.3 89.1 88.8  PLT 204 209 220   Basic Metabolic Panel:  Recent Labs  Lab 03/22/22 2220 03/23/22 0240  NA 129* 130*  K 4.0 3.9  CL 98 100  CO2 23 22  GLUCOSE 171* 100*  BUN 11 10  CREATININE 1.03 0.99  CALCIUM 8.5* 8.5*  MG 2.0  --    Lipid Panel:  Recent Labs  Lab 03/22/22 2220  CHOL 113  TRIG 95  HDL 32*  CHOLHDL 3.5  VLDL 19  LDLCALC 62   HgbA1c:  Recent Labs  Lab 03/21/22 1929  HGBA1C 6.1*   Urine Drug Screen:  Recent Labs  Lab 03/23/22 0917  LABOPIA NONE DETECTED  COCAINSCRNUR NONE DETECTED  LABBENZ NONE DETECTED  AMPHETMU NONE  DETECTED  THCU NONE DETECTED  LABBARB NONE DETECTED    Alcohol Level  Recent Labs  Lab 03/21/22 1929  ETH <10    IMAGING past 24 hours DG CHEST PORT 1 VIEW  Result Date: 03/22/2022 CLINICAL DATA:  Recent TIA EXAM: PORTABLE CHEST 1 VIEW COMPARISON:  10/17/2006 FINDINGS: The heart size and mediastinal contours are within normal limits. Both lungs are clear. The visualized skeletal structures are unremarkable. IMPRESSION: No active disease. Electronically Signed   By: Inez Catalina M.D.   On: 03/22/2022 22:38    PHYSICAL EXAM  Physical Exam  Constitutional: Appears well-developed and well-nourished.   Cardiovascular: Normal rate and regular rhythm.  Respiratory: Effort normal, non-labored breathing  Neuro: Mental Status: Patient is awake, alert, oriented to person, place, month, year, and situation. Patient is able to give a clear and coherent history. No signs of aphasia or neglect Cranial Nerves: II: Visual Fields are full. Pupils are equal, round, and reactive to light.  III,IV, VI: EOMI without ptosis or diploplia.  V: Facial sensation is symmetric to temperature VII: Facial movement is symmetric resting and smiling VIII: Hearing is intact to voice XI: Shoulder shrug is symmetric. XII: Tongue protrudes midline without atrophy or fasciculations.  Motor: Tone is normal. Bulk is normal. 5/5 strength was present in all four  extremities.  Sensory: Sensation is symmetric to light touch and temperature in the arms and legs.  Cerebellar: FNF and HKS are intact bilaterally  ASSESSMENT/PLAN Calvin Smith is a 61 y.o. male with history of diabetes, hypertension, hyperlipidemia, migraines, sleep apnea presenting with episode of ataxic gait.  Stroke:  Vertebrobasilar insufficiency, high grade stenosis of L vertebral artery on CTA Etiology:  large vessel disease Code Stroke CT head No acute abnormality.  CTA head & neck Negative for large vessel occlusion, but positive for  bulky calcified plaque at both carotid bifurcations and the proximal left vertebral artery: Right ICA origin and bulb RADIOGRAPHIC STRING SIGN. Proximal Left Vertebral Artery RADIOGRAPHIC STRING SIGN. Left Carotid Bifurcation High-grade Stenosis, approaching a radiographic string sign. 2. Also moderate Right Vertebral Artery V1 and V4 segment  stenoses due to plaque. 3. Aortic Atherosclerosis (ICD10-I70.0). Cerebral angio pending to determine degree of stenosis and possible neuroradiology intervention LDL 62 HgbA1c 6.1 VTE prophylaxis - SCDs    Diet   Diet NPO time specified Except for: Sips with Meds   aspirin 81 mg daily prior to admission, now on aspirin 81 mg daily and clopidogrel 75 mg daily for 3 months then plavix indefinitely Therapy recommendations:  No PT/OT/SLP follow-up Disposition:  Home  Hypertension Home meds:  Lisinopril '20mg'$ , held for now Stable Permissive hypertension (OK if < 220/120) but gradually normalize in 5-7 days Long-term BP goal normotensive  Hyperlipidemia Home meds:  lipitor 80, resumed in hospital LDL 62, goal < 70 Continue statin at discharge  Diabetes Mellitus Type II, well controlled Home meds:  lantus 45U BID, jardiance 25, ozempic '2mg'$  weekly metformin 1000 BID, glipizide '5mg'$  BID HgbA1c 6.1, goal < 7.0 CBGs Recent Labs    03/22/22 2240 03/23/22 0034 03/23/22 0442  GLUCAP 182* 132* 112*    SSI  Other Stroke Risk Factors Denies Cigarette smoker Denies ETOH use, alcohol level <10,  Substance abuse - UDS:  negative  Obesity, Body mass index is 30.18 kg/m., BMI >/= 30 associated with increased stroke risk, recommend weight loss, diet and exercise as appropriate  Denies Hx stroke/TIA Denies Coronary artery disease Hx of migraines  Obstructive sleep apnea, unknown if on CPAP at home  Hospital day # 1  Rolanda Lundborg, MD, PGY-1   STROKE MD NOTE :  I have personally obtained history,examined this patient, reviewed notes,  independently viewed imaging studies, participated in medical decision making and plan of care.ROS completed by me personally and pertinent positives fully documented  I have made any additions or clarifications directly to the above note. Agree with note above.  Patient presented with transient 20-minute episode of gait ataxia with nausea vomiting likely due to posterior circulation TIA and CT angiogram shows heavily calcified extracranial vessels with possible string sign at the origin of the left vertebral and bilateral internal carotid arteries.  Recommend check diagnostic cerebral catheter angiogram to confirm the stenosis and possibly consider elective revascularization of the left vertebral artery origin with angioplasty and stenting if necessary.  Aspirin and Plavix for 3 months followed by Plavix alone and aggressive risk factor modification.  Long discussion with patient and Dr.Ghimire and answered questions for TIA and stroke risk.  Greater than 50% time during this 50-minute visit was spent in counseling and coordination of care about TIA and use of vertebral and carotid disease and discussion of stroke prevention and revascularization and answering questions. Antony Contras, MD Medical Director [facility] Pager: [phone] 03/23/2022 1:42 PM  To contact  Stroke Continuity provider, please refer to [email or web address] After hours, contact General Neurology

## 2022-03-23 NOTE — Progress Notes (Signed)
Patient arrived to the unit. Wife at bedside. Alert and oriented X4. Cardiac tele connected to CCMD . Call light within reach. Bed alarm on.

## 2022-03-23 NOTE — Plan of Care (Signed)
  Problem: Education: Goal: Knowledge of General Education information will improve Description: Including pain rating scale, medication(s)/side effects and non-pharmacologic comfort measures Outcome: Progressing   Problem: Health Behavior/Discharge Planning: Goal: Ability to manage health-related needs will improve Outcome: Progressing   Problem: Clinical Measurements: Goal: Ability to maintain clinical measurements within normal limits will improve Outcome: Progressing Goal: Will remain free from infection Outcome: Progressing Goal: Diagnostic test results will improve Outcome: Progressing Goal: Respiratory complications will improve Outcome: Progressing Goal: Cardiovascular complication will be avoided Outcome: Progressing   Problem: Activity: Goal: Risk for activity intolerance will decrease Outcome: Progressing   Problem: Nutrition: Goal: Adequate nutrition will be maintained Outcome: Progressing   Problem: Coping: Goal: Level of anxiety will decrease Outcome: Progressing   Problem: Elimination: Goal: Will not experience complications related to bowel motility Outcome: Progressing Goal: Will not experience complications related to urinary retention Outcome: Progressing   Problem: Pain Managment: Goal: General experience of comfort will improve Outcome: Progressing   Problem: Safety: Goal: Ability to remain free from injury will improve Outcome: Progressing   Problem: Skin Integrity: Goal: Risk for impaired skin integrity will decrease Outcome: Progressing   Problem: Education: Goal: Ability to describe self-care measures that may prevent or decrease complications (Diabetes Survival Skills Education) will improve Outcome: Progressing Goal: Individualized Educational Video(s) Outcome: Progressing   Problem: Coping: Goal: Ability to adjust to condition or change in health will improve Outcome: Progressing   Problem: Fluid Volume: Goal: Ability to  maintain a balanced intake and output will improve Outcome: Progressing   Problem: Health Behavior/Discharge Planning: Goal: Ability to identify and utilize available resources and services will improve Outcome: Progressing Goal: Ability to manage health-related needs will improve Outcome: Progressing   Problem: Metabolic: Goal: Ability to maintain appropriate glucose levels will improve Outcome: Progressing   Problem: Nutritional: Goal: Maintenance of adequate nutrition will improve Outcome: Progressing Goal: Progress toward achieving an optimal weight will improve Outcome: Progressing   Problem: Skin Integrity: Goal: Risk for impaired skin integrity will decrease Outcome: Progressing   Problem: Tissue Perfusion: Goal: Adequacy of tissue perfusion will improve Outcome: Progressing   Problem: Education: Goal: Knowledge of disease or condition will improve Outcome: Progressing Goal: Knowledge of secondary prevention will improve (SELECT ALL) Outcome: Progressing Goal: Knowledge of patient specific risk factors will improve (INDIVIDUALIZE FOR PATIENT) Outcome: Progressing Goal: Individualized Educational Video(s) Outcome: Progressing   Problem: Ischemic Stroke/TIA Tissue Perfusion: Goal: Complications of ischemic stroke/TIA will be minimized Outcome: Progressing   Problem: Education: Goal: Understanding of CV disease, CV risk reduction, and recovery process will improve Outcome: Progressing Goal: Individualized Educational Video(s) Outcome: Progressing   Problem: Activity: Goal: Ability to return to baseline activity level will improve Outcome: Progressing   Problem: Cardiovascular: Goal: Ability to achieve and maintain adequate cardiovascular perfusion will improve Outcome: Progressing Goal: Vascular access site(s) Level 0-1 will be maintained Outcome: Progressing   Problem: Health Behavior/Discharge Planning: Goal: Ability to safely manage health-related  needs after discharge will improve Outcome: Progressing

## 2022-03-23 NOTE — Progress Notes (Signed)
SLP Cancellation Note  Patient Details Name: Calvin Smith MRN: [id number] DOB: [birth year]   Cancelled treatment:       Reason Eval/Treat Not Completed: SLP screened, no needs identified, will sign off Pt did not present with any acute speech, language, or cognitive-linguistic deficits when presented to the ED, MRI was normal, and no overt communication or cognitive-linguistic deficits have been documented by staff since. A formal evaluation does not appear to be clinically indicated at this time. SLP will sign off.   [name] I. Hardin Negus, [name], [name] Office number [phone]   [name] 03/23/2022, 10:13 AM

## 2022-03-23 NOTE — Progress Notes (Signed)
PROGRESS NOTE    Calvin Smith  WSF:[id number] DOB: Apr 15, 1961 DOA: 03/22/2022 PCP: [name], MD    Brief Narrative:  Patient with type 2 diabetes, hypertension hyperlipidemia and migraine headaches and sleep apnea presented to [facility] ER with episode of staggering and unsteady gait sudden onset.  Stroke work-up was done.  Negative MRI.  He was found to have bulky calcific plaque at both carotid bifurcations and proximal left vertebral artery, radiographically string sign right ICA and left vertebral artery.  Transferred to [facility] for neuroradiology intervention.   Assessment & Plan:   TIA/acute vertebral insufficiency: Clinical findings, difficulty gait.  Clinically improved.  Neurologically normal now. CT head findings, normal. MRI of the brain, no findings. CT angiogram of the head and neck with significant stenosis. Antiplatelet therapy, on aspirin and Plavix. LDL 62, at goal.  Patient on atorvastatin 80 mg daily. Hemoglobin A1c, 6.1.  On insulin. Neurology following. Patient going for four-vessel angiogram and possible stenting today with IR. PT/OT/speech, no deficits identified.  Type 2 diabetes, well controlled with hyperglycemia: Hemoglobin A1c 6.1.  Fairly controlled.  Continue home doses of insulin.  Essential hypertension: Blood pressure stable.  Permissive hypertension allowed due to significant flow obstruction.     DVT prophylaxis: SCD's Start: 03/22/22 2203   Code Status: Full code Family Communication: Wife at the bedside Disposition Plan: Status is: Observation The patient will require care spanning > 2 midnights and should be moved to inpatient because: Inpatient surgical procedure.     Consultants:  Neurology Interventional radiology  Procedures:  None  Antimicrobials:  None   Subjective: Patient seen and examined.  Wife at the bedside.  No other complaints.  Has some headache which is mostly chronic.  Wants to use Tylenol.   Looking forward for procedures.  Objective: Vitals:   03/22/22 2146 03/23/22 0040 03/23/22 0443 03/23/22 0817  BP:  111/70 123/75 131/81  Pulse:  77 82 81  Resp:  '19 16 19  '$ Temp:  97.6 F (36.4 C) 97.6 F (36.4 C) 98.4 F (36.9 C)  TempSrc:  Oral Oral Oral  SpO2:  99% 99% 99%  Weight: 84.8 kg     Height: '5\' 6"'$  (1.676 m)       Intake/Output Summary (Last 24 hours) at 03/23/2022 1033 Last data filed at 03/23/2022 0754 Gross per 24 hour  Intake 666.25 ml  Output --  Net 666.25 ml   Filed Weights   03/22/22 2146  Weight: 84.8 kg    Examination:  General exam: Appears calm and comfortable  Anxious. Respiratory system: No added sounds. Cardiovascular system: S1 & S2 heard, RRR.  Gastrointestinal system: Soft.  Nontender.  Bowel sound present. Central nervous system: Alert and oriented. No focal neurological deficits. Extremities: Symmetric 5 x 5 power.    Data Reviewed: I have personally reviewed following labs and imaging studies  CBC: Recent Labs  Lab 03/21/22 1929 03/22/22 2220 03/23/22 0240  WBC 8.2 7.0 8.1  NEUTROABS 5.0 4.0  --   HGB 14.5 14.7 14.2  HCT 41.1 40.1 38.9*  MCV 90.3 89.1 88.8  PLT 204 209 017   Basic Metabolic Panel: Recent Labs  Lab 03/21/22 1929 03/22/22 2220 03/23/22 0240  NA 131* 129* 130*  K 4.2 4.0 3.9  CL 98 98 100  CO2 '24 23 22  '$ GLUCOSE 153* 171* 100*  BUN '15 11 10  '$ CREATININE 1.02 1.03 0.99  CALCIUM 8.8* 8.5* 8.5*  MG  --  2.0  --  GFR: Estimated Creatinine Clearance: 80 mL/min (by C-G formula based on SCr of 0.99 mg/dL). Liver Function Tests: Recent Labs  Lab 03/21/22 1929 03/22/22 2220 03/23/22 0240  AST 36 33 30  ALT 41 45* 40  ALKPHOS 69 60 57  BILITOT 1.8* 1.8* 1.5*  PROT 7.4 6.6 6.3*  ALBUMIN 4.3 3.6 3.4*   No results for input(s): "LIPASE", "AMYLASE" in the last 168 hours. No results for input(s): "AMMONIA" in the last 168 hours. Coagulation Profile: Recent Labs  Lab 03/21/22 1929  INR 1.1    Cardiac Enzymes: No results for input(s): "CKTOTAL", "CKMB", "CKMBINDEX", "TROPONINI" in the last 168 hours. BNP (last 3 results) No results for input(s): "PROBNP" in the last 8760 hours. HbA1C: Recent Labs    03/21/22 1929  HGBA1C 6.1*   CBG: Recent Labs  Lab 03/22/22 1814 03/22/22 2240 03/23/22 0034 03/23/22 0442 03/23/22 0835  GLUCAP 197* 182* 132* 112* 132*   Lipid Profile: Recent Labs    03/22/22 2220  CHOL 113  HDL 32*  LDLCALC 62  TRIG 95  CHOLHDL 3.5   Thyroid Function Tests: Recent Labs    03/22/22 2220  TSH 2.541   Anemia Panel: No results for input(s): "VITAMINB12", "FOLATE", "FERRITIN", "TIBC", "IRON", "RETICCTPCT" in the last 72 hours. Sepsis Labs: No results for input(s): "PROCALCITON", "LATICACIDVEN" in the last 168 hours.  No results found for this or any previous visit (from the past 240 hour(s)).       Radiology Studies: DG CHEST PORT 1 VIEW  Result Date: 03/22/2022 CLINICAL DATA:  Recent TIA EXAM: PORTABLE CHEST 1 VIEW COMPARISON:  10/17/2006 FINDINGS: The heart size and mediastinal contours are within normal limits. Both lungs are clear. The visualized skeletal structures are unremarkable. IMPRESSION: No active disease. Electronically Signed   By: Inez Catalina M.D.   On: 03/22/2022 22:38   MR BRAIN WO CONTRAST  Result Date: 03/22/2022 CLINICAL DATA:  61 year old male TIA. Dizziness. Unsteady gait. Neurologic deficit. EXAM: MRI HEAD WITHOUT CONTRAST TECHNIQUE: Multiplanar, multiecho pulse sequences of the brain and surrounding structures were obtained without intravenous contrast. COMPARISON:  CTA head and neck earlier today. FINDINGS: Brain: No restricted diffusion to suggest acute infarction. No midline shift, mass effect, evidence of mass lesion, ventriculomegaly, extra-axial collection or acute intracranial hemorrhage. Cervicomedullary junction and pituitary are within normal limits. Scattered small subcortical white matter T2 and FLAIR  hyperintense foci, mild to moderate for age. Occasional periventricular involvement. No cortical encephalomalacia or chronic cerebral blood products identified. Deep gray nuclei, brainstem, and cerebellum are normal for age. Vascular: Major intracranial vascular flow voids are preserved. Skull and upper cervical spine: Negative. Visualized bone marrow signal is within normal limits. Sinuses/Orbits: Negative. Other: Mastoids are clear. Grossly negative visible internal auditory structures. Normal stylomastoid foramina. Negative visible scalp and face. IMPRESSION: 1. No acute intracranial abnormality. 2. Mild to moderate for age nonspecific cerebral white matter signal changes, most commonly due to chronic small vessel disease. Electronically Signed   By: Genevie Ann M.D.   On: 03/22/2022 09:19   CT ANGIO HEAD NECK W WO CM  Result Date: 03/22/2022 CLINICAL DATA:  61 year old male TIA.  Dizziness. EXAM: CT ANGIOGRAPHY HEAD AND NECK TECHNIQUE: Multidetector CT imaging of the head and neck was performed using the standard protocol during bolus administration of intravenous contrast. Multiplanar CT image reconstructions and MIPs were obtained to evaluate the vascular anatomy. Carotid stenosis measurements (when applicable) are obtained utilizing NASCET criteria, using the distal internal carotid diameter as the denominator. RADIATION  DOSE REDUCTION: This exam was performed according to the departmental dose-optimization program which includes automated exposure control, adjustment of the mA and/or kV according to patient size and/or use of iterative reconstruction technique. CONTRAST:  37m OMNIPAQUE IOHEXOL 350 MG/ML SOLN COMPARISON:  Plain head CT 03/21/2022. FINDINGS: CTA NECK Skeleton: No acute osseous abnormality identified. Tympanic cavities and Visualized paranasal sinuses and mastoids are clear. Upper chest: Negative. Other neck: Motion artifact at the soft palate, oropharynx. No acute neck soft tissue finding  identified. Aortic arch: Mild arch atherosclerosis. Three vessel arch configuration. Right carotid system: Minimal brachiocephalic artery origin plaque without stenosis. Normal right CCA in total lateral calcified plaque which is just proximal to the bifurcation not resulting in stenosis (series 4, image 115). Patent carotid bifurcation. Bulky calcified plaque at the right ICA origin and bulb. Radiographic string sign stenosis results as seen on series 6 images 207 through 201, along a segment of up to 16 mm. Right ICA remains patent and is otherwise negative to the skull base. Left carotid system: Mild left CCA origin plaque without stenosis. Bulky calcified plaque beginning at the left carotid bifurcation and continuing into the left ICA origin and bulb with high-grade stenosis just before the left ICA origin on series 6, image 217 approaching a radiographic string sign. Origin and bulb calcified plaque then results in less than 50 % stenosis with respect to the distal vessel. Vertebral arteries: Calcified plaque at the right subclavian artery origin without stenosis. Calcified plaque near the right vertebral artery origin,. Additional right V1 (series 6, images 288 and 273) with at least moderate V1 segment stenosis. But the right vertebral artery remains patent to the skull base with no additional plaque. Proximal left subclavian artery calcified plaque with no significant stenosis but bulky and long segment nearly 2.5 cm calcified plaque throughout the proximal left vertebral artery (series 7, image 128) with high-grade stenosis. However, the left vertebral is mildly dominant and remains patent. No additional plaque or stenosis to the skull base. CTA HEAD Posterior circulation: Mildly dominant left vertebral V4 segment with normal left PICA origin and vertebrobasilar junction. No distal left vertebral plaque or stenosis. Contralateral right vertebral V4 segment moderate calcified plaque and stenosis on series  6, image 147. But the right vertebral remains patent to the vertebrobasilar junction. The right AICA appears dominant. Patent basilar artery with mild irregularity. No significant basilar stenosis. Patent SCA and PCA origins. Right posterior communicating artery is present. Bilateral PCA branches are patent with mild irregularity. Anterior circulation: Both ICA siphons are patent. Only mild supraclinoid ICA calcified plaque. No significant ICA siphon stenosis. Patent carotid termini. Patent MCA and ACA origins. Normal anterior communicating artery. Bilateral ACA branches are within normal limits. Left MCA M1 segment and bifurcation are patent without stenosis. Left MCA branches appear mildly irregular. Right MCA M1 segment is tortuous and bifurcates early without stenosis. Right MCA branches appear patent with mild irregularity. Venous sinuses: Early contrast timing, grossly patent. Anatomic variants: Mildly dominant left vertebral artery. Review of the MIP images confirms the above findings IMPRESSION: 1. Negative for large vessel occlusion, but positive for bulky calcified plaque at both carotid bifurcations and the proximal left vertebral artery: - Right ICA origin and bulb RADIOGRAPHIC STRING SIGN. - proximal Left Vertebral Artery RADIOGRAPHIC STRING SIGN. - Left Carotid Bifurcation High-grade Stenosis, approaching a radiographic string sign. 2. Also moderate Right Vertebral Artery V1 and V4 segment stenoses due to plaque. 3. Aortic Atherosclerosis (ICD10-I70.0). Electronically Signed   By: HLemmie Evens  [place] Crane M.D.   On: 03/22/2022 05:12   CT HEAD WO CONTRAST  Result Date: 03/21/2022 CLINICAL DATA:  Dizziness. EXAM: CT HEAD WITHOUT CONTRAST TECHNIQUE: Contiguous axial images were obtained from the base of the skull through the vertex without intravenous contrast. RADIATION DOSE REDUCTION: This exam was performed according to the departmental dose-optimization program which includes automated exposure control, adjustment  of the mA and/or kV according to patient size and/or use of iterative reconstruction technique. COMPARISON:  None Available. FINDINGS: Brain: No evidence of acute infarction, hemorrhage, hydrocephalus, extra-axial collection or mass lesion/mass effect. Vascular: No hyperdense vessel or unexpected calcification. Skull: Normal. Negative for fracture or focal lesion. Sinuses/Orbits: There is mild right ethmoid sinus mucosal thickening. Other: None. IMPRESSION: No acute intracranial abnormality. Electronically Signed   By: Virgina Norfolk M.D.   On: 03/21/2022 19:58        Scheduled Meds:   stroke: early stages of recovery book   Does not apply Once   aspirin EC  81 mg Oral Daily   atorvastatin  80 mg Oral Daily   clopidogrel  75 mg Oral Daily   insulin aspart  0-9 Units Subcutaneous Q4H   insulin glargine-yfgn  30 Units Subcutaneous BID   pantoprazole  40 mg Oral Daily   Continuous Infusions:  sodium chloride 75 mL/hr at 03/23/22 0754     LOS: 1 day    Time spent: 35 minutes    Barb Merino, MD [facility] Pager [phone]

## 2022-03-24 ENCOUNTER — Encounter (HOSPITAL_COMMUNITY): Payer: Self-pay | Admitting: Internal Medicine

## 2022-03-24 ENCOUNTER — Encounter (HOSPITAL_COMMUNITY)
Admission: RE | Disposition: A | Discharge status: Home / Self Care | Payer: Self-pay | Source: Other Acute Inpatient Hospital | Attending: Internal Medicine

## 2022-03-24 ENCOUNTER — Inpatient Hospital Stay (HOSPITAL_COMMUNITY): Payer: 59 | Admitting: Certified Registered"

## 2022-03-24 ENCOUNTER — Inpatient Hospital Stay (HOSPITAL_COMMUNITY): Payer: 59

## 2022-03-24 ENCOUNTER — Other Ambulatory Visit (HOSPITAL_COMMUNITY): Payer: Self-pay | Admitting: Neuroradiology

## 2022-03-24 DIAGNOSIS — Z7984 Long term (current) use of oral hypoglycemic drugs: Secondary | ICD-10-CM

## 2022-03-24 DIAGNOSIS — I63211 Cerebral infarction due to unspecified occlusion or stenosis of right vertebral arteries: Secondary | ICD-10-CM | POA: Diagnosis present

## 2022-03-24 DIAGNOSIS — I6502 Occlusion and stenosis of left vertebral artery: Secondary | ICD-10-CM

## 2022-03-24 DIAGNOSIS — Z794 Long term (current) use of insulin: Secondary | ICD-10-CM

## 2022-03-24 DIAGNOSIS — E119 Type 2 diabetes mellitus without complications: Secondary | ICD-10-CM

## 2022-03-24 DIAGNOSIS — I10 Essential (primary) hypertension: Secondary | ICD-10-CM

## 2022-03-24 DIAGNOSIS — Z8673 Personal history of transient ischemic attack (TIA), and cerebral infarction without residual deficits: Secondary | ICD-10-CM | POA: Diagnosis not present

## 2022-03-24 HISTORY — PX: RADIOLOGY WITH ANESTHESIA: SHX6223

## 2022-03-24 HISTORY — PX: IR US GUIDE VASC ACCESS RIGHT: IMG2390

## 2022-03-24 HISTORY — PX: IR TRANSCATH EXCRAN VERT OR CAR A STENT: IMG1955

## 2022-03-24 LAB — GLUCOSE, CAPILLARY
Glucose-Capillary: 132 mg/dL — ABNORMAL HIGH (ref 70–99)
Glucose-Capillary: 130 mg/dL — ABNORMAL HIGH (ref 70–99)
Glucose-Capillary: 118 mg/dL — ABNORMAL HIGH (ref 70–99)
Glucose-Capillary: 113 mg/dL — ABNORMAL HIGH (ref 70–99)
Glucose-Capillary: 179 mg/dL — ABNORMAL HIGH (ref 70–99)
Glucose-Capillary: 224 mg/dL — ABNORMAL HIGH (ref 70–99)
Glucose-Capillary: 164 mg/dL — ABNORMAL HIGH (ref 70–99)

## 2022-03-24 LAB — POCT ACTIVATED CLOTTING TIME
Activated Clotting Time: 143 seconds
Activated Clotting Time: 239 seconds
Activated Clotting Time: 263 seconds

## 2022-03-24 SURGERY — IR WITH ANESTHESIA
Anesthesia: General | Laterality: Left

## 2022-03-24 MED ORDER — LABETALOL HCL 5 MG/ML IV SOLN
INTRAVENOUS | Status: DC | PRN
  Administered 2022-03-24: 5 mg via INTRAVENOUS

## 2022-03-24 MED ORDER — CLEVIDIPINE BUTYRATE 0.5 MG/ML IV EMUL: 0.0000 mg/h | INTRAVENOUS | Status: DC

## 2022-03-24 MED ORDER — FENTANYL CITRATE (PF) 100 MCG/2ML IJ SOLN: 25.0000 ug | INTRAMUSCULAR | Status: DC | PRN

## 2022-03-24 MED ORDER — CHLORHEXIDINE GLUCONATE CLOTH 2 % EX PADS
6.0000 | MEDICATED_PAD | Freq: Every day | CUTANEOUS | Status: DC
  Administered 2022-03-24 – 2022-03-25 (×2): 6 via TOPICAL

## 2022-03-24 MED ORDER — SUGAMMADEX SODIUM 200 MG/2ML IV SOLN
INTRAVENOUS | Status: DC | PRN
  Administered 2022-03-24: 200 mg via INTRAVENOUS

## 2022-03-24 MED ORDER — FENTANYL CITRATE (PF) 250 MCG/5ML IJ SOLN
INTRAMUSCULAR | Status: AC
  Filled 2022-03-24: qty 5

## 2022-03-24 MED ORDER — IOHEXOL 300 MG/ML  SOLN
100.0000 mL | Freq: Once | INTRAMUSCULAR | Status: AC | PRN
  Administered 2022-03-24: 30 mL via INTRA_ARTERIAL

## 2022-03-24 MED ORDER — PROPOFOL 10 MG/ML IV BOLUS
INTRAVENOUS | Status: DC | PRN
  Administered 2022-03-24: 100 mg via INTRAVENOUS
  Administered 2022-03-24: 50 mg via INTRAVENOUS

## 2022-03-24 MED ORDER — AMISULPRIDE (ANTIEMETIC) 5 MG/2ML IV SOLN: 10.0000 mg | Freq: Once | INTRAVENOUS | Status: DC | PRN

## 2022-03-24 MED ORDER — ACETAMINOPHEN 650 MG RE SUPP: 650.0000 mg | RECTAL | Status: DC | PRN

## 2022-03-24 MED ORDER — ACETAMINOPHEN 160 MG/5ML PO SOLN: 650.0000 mg | ORAL | Status: DC | PRN

## 2022-03-24 MED ORDER — CHLORHEXIDINE GLUCONATE 0.12 % MT SOLN
15.0000 mL | Freq: Once | OROMUCOSAL | Status: AC
Start: 2022-03-24 — End: 2022-03-24

## 2022-03-24 MED ORDER — ASPIRIN 325 MG PO TABS
ORAL_TABLET | ORAL | Status: AC
  Filled 2022-03-24: qty 1

## 2022-03-24 MED ORDER — LACTATED RINGERS IV SOLN: INTRAVENOUS | Status: DC | PRN

## 2022-03-24 MED ORDER — IOHEXOL 300 MG/ML  SOLN
100.0000 mL | Freq: Once | INTRAMUSCULAR | Status: AC | PRN
  Administered 2022-03-24: 45 mL via INTRA_ARTERIAL

## 2022-03-24 MED ORDER — PHENYLEPHRINE 80 MCG/ML (10ML) SYRINGE FOR IV PUSH (FOR BLOOD PRESSURE SUPPORT)
PREFILLED_SYRINGE | INTRAVENOUS | Status: DC | PRN
  Administered 2022-03-24: 80 ug via INTRAVENOUS
  Administered 2022-03-24: 40 ug via INTRAVENOUS

## 2022-03-24 MED ORDER — ONDANSETRON HCL 4 MG/2ML IJ SOLN
INTRAMUSCULAR | Status: DC | PRN
  Administered 2022-03-24: 4 mg via INTRAVENOUS

## 2022-03-24 MED ORDER — CHLORHEXIDINE GLUCONATE 0.12 % MT SOLN
OROMUCOSAL | Status: AC
  Administered 2022-03-24: 15 mL via OROMUCOSAL
  Filled 2022-03-24: qty 15

## 2022-03-24 MED ORDER — HEPARIN SODIUM (PORCINE) 1000 UNIT/ML IJ SOLN
INTRAMUSCULAR | Status: DC | PRN
  Administered 2022-03-24: 5000 [IU] via INTRAVENOUS
  Administered 2022-03-24: 1000 [IU] via INTRAVENOUS

## 2022-03-24 MED ORDER — ACETAMINOPHEN 325 MG PO TABS: 650.0000 mg | ORAL_TABLET | ORAL | Status: DC | PRN

## 2022-03-24 MED ORDER — LACTATED RINGERS IV SOLN: INTRAVENOUS | Status: DC

## 2022-03-24 MED ORDER — LIDOCAINE 2% (20 MG/ML) 5 ML SYRINGE
INTRAMUSCULAR | Status: DC | PRN
  Administered 2022-03-24: 60 mg via INTRAVENOUS

## 2022-03-24 MED ORDER — DEXAMETHASONE SODIUM PHOSPHATE 10 MG/ML IJ SOLN
INTRAMUSCULAR | Status: DC | PRN
  Administered 2022-03-24: 5 mg via INTRAVENOUS

## 2022-03-24 MED ORDER — PHENYLEPHRINE HCL-NACL 20-0.9 MG/250ML-% IV SOLN
INTRAVENOUS | Status: DC | PRN
  Administered 2022-03-24: 15 ug/min via INTRAVENOUS

## 2022-03-24 MED ORDER — ASPIRIN 325 MG PO TABS
ORAL_TABLET | ORAL | Status: AC | PRN
  Administered 2022-03-24: 325 mg via ORAL

## 2022-03-24 MED ORDER — ORAL CARE MOUTH RINSE: 15.0000 mL | Freq: Once | OROMUCOSAL | Status: AC

## 2022-03-24 MED ORDER — INSULIN ASPART 100 UNIT/ML IJ SOLN: 0.0000 [IU] | INTRAMUSCULAR | Status: DC | PRN

## 2022-03-24 MED ORDER — POLYETHYLENE GLYCOL 3350 17 G PO PACK
17.0000 g | PACK | Freq: Every day | ORAL | Status: DC
  Administered 2022-03-24 – 2022-03-25 (×2): 17 g via ORAL
  Filled 2022-03-24 (×2): qty 1

## 2022-03-24 MED ORDER — ROCURONIUM BROMIDE 10 MG/ML (PF) SYRINGE
PREFILLED_SYRINGE | INTRAVENOUS | Status: DC | PRN
  Administered 2022-03-24 (×2): 20 mg via INTRAVENOUS
  Administered 2022-03-24: 50 mg via INTRAVENOUS

## 2022-03-24 MED ORDER — FENTANYL CITRATE (PF) 250 MCG/5ML IJ SOLN
INTRAMUSCULAR | Status: DC | PRN
  Administered 2022-03-24 (×3): 50 ug via INTRAVENOUS

## 2022-03-24 NOTE — Procedures (Signed)
INTERVENTIONAL NEURORADIOLOGY BRIEF POSTPROCEDURE NOTE  DIAGNOSTIC CEREBRAL ANGIOGRAM AND LEFT VERTEBRAL ARTERY ANGIOPLASTY AND STENTING  Attending: Dr. Pedro Earls  Diagnosis: Severe ostial vertebral artery stenosis  Access site: Right common femoral artery   Access closure: Perclose proglide  Anesthesia: GETA  Medication used: 6000 units heparin.  Complications: None.  Estimated blood loss: None.  Specimen: None.  Findings: Severe stenosis at the origin of the left vertebral artery. Angioplasty/stenting performed with a 4.5 x 20 mm synergy balloon mounted stent with complete resolution of stenosis. No evidence of thromboembolic complication.  The patient tolerated the procedure well without incident or complication and is in stable condition.   PLAN: - bed rest x 6 hours - SBP 100-160 mmHg

## 2022-03-24 NOTE — Transfer of Care (Signed)
Immediate Anesthesia Transfer of Care Note  Patient: Calvin Smith  Procedure(s) Performed: IR WITH ANESTHESIA (Left)  Patient Location: PACU  Anesthesia Type:General  Level of Consciousness: awake, alert , oriented and patient cooperative  Airway & Oxygen Therapy: Patient Spontanous Breathing and Patient connected to nasal cannula oxygen  Post-op Assessment: Report given to RN, Post -op Vital signs reviewed and stable and Patient moving all extremities X 4  Post vital signs: Reviewed and stable  Last Vitals:  Vitals Value Taken Time  BP 111/91 03/24/22 1045  Temp    Pulse 92 03/24/22 1048  Resp 18 03/24/22 1048  SpO2 96 % 03/24/22 1048  Vitals shown include unvalidated device data.  Last Pain:  Vitals:   03/24/22 0331  TempSrc: Oral  PainSc:       Patients Stated Pain Goal: 0 (67/61/95 0932)  Complications: No notable events documented.

## 2022-03-24 NOTE — Consult Note (Addendum)
Chief Complaint: Cerebral and vertebral stenosis. Request is for cerebral angiogram with intervention  Referring Physician(s): Agbata,Tochukwu  Supervising Physician: Pedro Earls  Patient Status: [facility] - In-pt  History of Present Illness: Calvin Smith is a 61 y.o. male inpatient. History of DM, HTN, HLD, migraine headaches, sleep apnea. Patent presented to the ED at [facility] on 8.16.23  One episode of unsteady gait, diaphoresis and dizziness that self resolved.  Code stroke was called at the Patient was transferred to [facility] for further evaluation. Cerebral angiogram performed on 8.17.23. by Dr. Raliegh Ip. Karenann Cai found to have severe stenosis in the left verbal artery, right carotid bifurcation, left carotid bifurcation and right vertebral artery. Patient presents for cerebral angiogram with intervention with  general anesthesia.   Currently without any significant complaints. Patient alert and laying in bed, calm. Denies any fevers, headache, chest pain, SOB, cough, abdominal pain, nausea, vomiting or bleeding. Return precautions and treatment recommendations and follow-up discussed with the patient  who is agreeable with the plan.   Past Medical History:  Diagnosis Date   Allergic rhinitis    Arm fracture, left as a child   DeQuervain's disease (tenosynovitis)    right wrist   Diabetes mellitus    type 2   Erectile dysfunction    Hyperlipidemia    Hypertension    Migraines    Sleep apnea     Past Surgical History:  Procedure Laterality Date   Cardiolite  5/06   Negative EF 66%   CARPAL TUNNEL RELEASE  9/03   left    IR ANGIO INTRA EXTRACRAN SEL COM CAROTID INNOMINATE BILAT MOD SED  03/23/2022   IR ANGIO VERTEBRAL SEL SUBCLAVIAN INNOMINATE BILAT MOD SED  03/23/2022   IR US GUIDE VASC ACCESS RIGHT  03/23/2022   WRIST SURGERY  1/04   DeQuervain release   WRIST SURGERY  1/08   ORIF l wrist DVR plate screwhead autologous     Allergies: Bisoprolol-hydrochlorothiazide and Codeine  Medications: Prior to Admission medications   Medication Sig Start Date End Date Taking? Authorizing Provider  aspirin EC 81 MG tablet Take 81 mg by mouth daily.   Yes [provider]  atorvastatin (LIPITOR) 80 MG tablet Take 1 tablet (80 mg total) by mouth daily. 03/23/22 06/21/22 Yes Agbata, Tochukwu, MD  empagliflozin (JARDIANCE) 25 MG TABS tablet Take 25 mg by mouth daily.   Yes [provider]  glipiZIDE (GLUCOTROL XL) 5 MG 24 hr tablet Take 5 mg by mouth in the morning and at bedtime.   Yes [provider]  ibuprofen (ADVIL) 200 MG tablet Take 400-600 mg by mouth daily as needed for headache.   Yes [provider]  LANTUS SOLOSTAR 100 UNIT/ML Solostar Pen INJECT SUBCUTANEOUSLY 90  UNITS AT BEDTIME Patient taking differently: 45 Units 2 (two) times daily. 03/01/22  Yes Viviana Simpler I, MD  lisinopril (ZESTRIL) 20 MG tablet Take 20 mg by mouth daily.   Yes [provider]  metFORMIN (GLUCOPHAGE) 1000 MG tablet Take 1,000 mg by mouth 2 (two) times daily with a meal.   Yes [provider]  omeprazole (PRILOSEC) 20 MG capsule TAKE 1 CAPSULE BY MOUTH IN THE MORNING AND AT BEDTIME. Patient taking differently: Take 20 mg by mouth 2 (two) times daily before a meal. 10/03/21  Yes [name], MD  Semaglutide, 2 MG/DOSE, (OZEMPIC, 2 MG/DOSE,) 8 MG/3ML SOPN Inject 2 mg into the skin once a week.   Yes [provider]  clopidogrel (PLAVIX) 75 MG tablet Take 1 tablet (75 mg total) by mouth daily. Patient not taking: Reported on 03/23/2022 03/23/22 04/22/22  Collier Bullock, MD     Family History  Problem Relation Age of Onset   Hypertension Father    Lymphoma Father        non-hodgkins   Uterine cancer Maternal Grandmother    Hypertension Paternal Grandfather    Lung cancer Paternal Aunt    Coronary artery disease Paternal Uncle    Colon cancer Unknown        in 1  relative (?aunt)    Social History   Socioeconomic History   Marital status: Married    Spouse name: Not on file   Number of children: Not on file   Years of education: Not on file   Highest education level: Not on file  Occupational History   Occupation: Buyer, 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  Tobacco Use   Smoking status: Never   Smokeless tobacco: Never  Vaping Use   Vaping Use: Never used  Substance and Sexual Activity   Alcohol use: Yes    Comment: rare   Drug use: No   Sexual activity: Yes    Partners: Female  Other Topics Concern   Not on file  Social History Narrative   Regular exercise: light to moderate   Caffeine use: coffee daily   Social Determinants of Radio broadcast assistant Strain: Not on file  Food Insecurity: Not on file  Transportation Needs: Not on file  Physical Activity: Not on file  Stress: Not on file  Social Connections: Not on file     Review of Systems: A 12 point ROS discussed and pertinent positives are indicated in the HPI above.  All other systems are negative.  Review of Systems  Constitutional:  Negative for fever.  HENT:  Negative for congestion.   Respiratory:  Negative for cough and shortness of breath.   Cardiovascular:  Negative for chest pain.  Gastrointestinal:  Negative for abdominal pain.  Neurological:  Negative for headaches.  Psychiatric/Behavioral:  Negative for behavioral problems and confusion.     Vital Signs: BP (!) 144/89   Pulse 88   Temp 98.4 F (36.9 C)   Resp 20   Ht '5\' 6"'  (1.676 m)   Wt 187 lb (84.8 kg)   SpO2 97%   BMI 30.18 kg/m     Physical Exam Vitals and nursing note reviewed.  Constitutional:      Appearance: He is well-developed.  HENT:     Head: Normocephalic.  Cardiovascular:     Rate and Rhythm: Normal rate and regular rhythm.     Heart sounds: Normal heart sounds.  Pulmonary:     Effort: Pulmonary effort is normal.     Breath sounds: Normal  breath sounds.  Musculoskeletal:        General: Normal range of motion.     Cervical back: Normal range of motion.  Skin:    General: Skin is dry.  Neurological:     Mental Status: He is alert and oriented to person, place, and time.     Comments: Alert, aware and oriented X 3 Speech and comprehension is intact.  PERRL bilaterally No facial droop noted Tongue midline Can spontaneously move all 4 extremities.  Fine motor and coordination  intact.   Speech, cognition and language intact.  Comprehension and fluency are normal.  Judgment and insight normal  Gait not assessed Romberg not assessed Heel to toe not assessed Distal pulses not assessed      Imaging: IR US Guide Vasc Access Right  Result Date: 03/23/2022 INDICATION: 62 year old male with past medical history significant for diabetes, hypertension, hyperlipidemia, migraines, sleep apnea who presented to the ED for evaluation after an episode of ataxic gait, vertigo and nausea. Symptoms resolved after 20 minutes. CT of the head without contrast showed no acute intracranial abnormality. CT angiogram of the head and neck showed multifocal severe stenosis in bilateral carotid bifurcation and origin of the vertebral arteries. Due to densely calcified plaques, decision was in made to proceed with a diagnostic cerebral angiogram to better evaluate the degree of stenosis. EXAM: BILATERAL COMMON CAROTID AND INNOMINATE ANGIOGRAPHY AND BILATERAL VERTEBRAL ARTERY ANGIOGRAMS MEDICATIONS: No antibiotics administered. ANESTHESIA/SEDATION: Moderate (conscious) sedation was employed during this procedure. A total of Versed 2 mg and Fentanyl 50 mcg was administered intravenously by the radiology nurse. Total intra-service moderate Sedation Time: 43 minutes. The patient's level of consciousness and vital signs were monitored continuously by radiology nursing throughout the procedure under my direct supervision. FLUOROSCOPY: Radiation Exposure  Index (as provided by the fluoroscopic device): 161 mGy Kerma COMPLICATIONS: None immediate. TECHNIQUE: Informed written consent was obtained from the patient after a thorough discussion of the procedural risks, benefits and alternatives. All questions were addressed. Maximal Sterile Barrier Technique was utilized including caps, mask, sterile gowns, sterile gloves, sterile drape, hand hygiene and skin antiseptic. A timeout was performed prior to the initiation of the procedure. The right groin was prepped and draped in the usual sterile fashion. Using a micropuncture kit and the modified Seldinger technique, access was gained to the right common femoral artery and a 5 French sheath was placed. Real-time ultrasound guidance was utilized for vascular access including the acquisition of a permanent ultrasound image documenting patency of the accessed vessel. Under fluoroscopy, a 5 [place] Berenstein 2 catheter was navigated over 0.035" Terumo Glidewire into the aortic arch. The catheter was placed into the left subclavian artery. Frontal, lateral and multiple oblique angiograms of the neck were obtained. Then, frontal and lateral view of the head was obtained. The catheter was then placed into the right common carotid artery. Frontal, lateral and bilateral oblique angiograms of the neck were obtained. Then, frontal and lateral angiograms of the head were obtained. The catheter was subsequently placed into the right subclavian artery. Frontal and lateral angiograms of the neck were obtained followed by frontal and lateral angiograms of the head. Next, the catheter was placed into the left common carotid artery. Frontal, lateral and bilateral oblique angiograms of the neck were obtained followed by frontal and lateral angiograms of the head. The catheter was subsequently withdrawn. Right common femoral artery angiogram was obtained in right anterior oblique view. The puncture is at the level of the common femoral artery.  The artery has normal caliber, adequate for closure device. A 5 [place] Exoseal was utilized for access closure. Immediate hemostasis was achieved. PROCEDURE: No intervention was performed. FINDINGS: Left subclavian angiograms: Severely calcified plaque in the left subclavian artery extending to the at the origin of the left vertebral artery, resulting in severe stenosis at the origin of the left vertebral artery (85%), without significant stenosis of the subclavian artery. Delayed flow is noted in the left vertebral artery distal to the stenosis. Left vertebral artery angiograms: The intracranial left vertebral artery, basilar artery, and bilateral posterior cerebral arteries are unremarkable. Luminal caliber is smooth and tapering. No aneurysms  or abnormally high-flow, early draining veins are seen. No regions of abnormal hypervascularity are noted. The visualized dural sinuses are patent. Right CCA angiograms: Cervical angiograms show prominently calcified plaques along the right carotid bulb resulting in approximately 80% stenosis. Right ICA angiograms: There is brisk vascular contrast filling of the right ACA and MCA vascular trees. There is also brisk contrast opacification of the right PCA vascular tree via a prominent posterior communicating artery. Luminal caliber is smooth and tapering. No aneurysms or abnormally high-flow, early draining veins are seen. No regions of abnormal hypervascularity are noted. The visualized dural sinuses are patent. The visualized branches of the right external carotid artery are unremarkable. Right subclavian angiograms: Atherosclerotic changes at the origin of the right vertebral artery resulting in approximately 70% stenosis. The right subclavian artery have normal caliber. Right vertebral artery angiograms: Mild atherosclerotic changes at the V4 segment of the right vertebral artery without hemodynamically significant stenosis. The basilar artery, and bilateral posterior  cerebral arteries are unremarkable. Luminal caliber is smooth and tapering. No aneurysms or abnormally high-flow, early draining veins are seen. No regions of abnormal hypervascularity are noted. The visualized dural sinuses are patent. Left CCA angiograms: Prominently calcified plaques in the left carotid bifurcation resulting in approximately 80% stenosis. Left ICA angiograms: There is brisk vascular contrast filling of the left ACA and MCA vascular trees. Luminal caliber is smooth and tapering. No aneurysms or abnormally high-flow, early draining veins are seen. No regions of abnormal hypervascularity are noted. The visualized dural sinuses are patent. The visualized branches of the left external carotid artery are unremarkable. Right common femoral artery angiograms: The access is at the level of the mid right common femoral artery. The femoral artery has normal caliber, adequate for closure device. IMPRESSION: 1. Severe atherosclerotic disease at the origin of the left vertebral artery resulting in approximately 85% stenosis. 2. Severe atherosclerotic disease at the right carotid bifurcation resulting approximately 80% stenosis. 3. Severe atherosclerotic disease at the left carotid bifurcation resulting in approximately 80% stenosis. 4. Atherosclerotic disease at the origin of the right vertebral artery resulting in approximately 70% stenosis. Electronically Signed   By: Pedro Earls M.D.   On: 03/23/2022 14:20   IR ANGIO VERTEBRAL SEL SUBCLAVIAN INNOMINATE BILAT MOD SED  Result Date: 03/23/2022 INDICATION: 61 year old male with past medical history significant for diabetes, hypertension, hyperlipidemia, migraines, sleep apnea who presented to the ED for evaluation after an episode of ataxic gait, vertigo and nausea. Symptoms resolved after 20 minutes. CT of the head without contrast showed no acute intracranial abnormality. CT angiogram of the head and neck showed multifocal severe  stenosis in bilateral carotid bifurcation and origin of the vertebral arteries. Due to densely calcified plaques, decision was in made to proceed with a diagnostic cerebral angiogram to better evaluate the degree of stenosis. EXAM: BILATERAL COMMON CAROTID AND INNOMINATE ANGIOGRAPHY AND BILATERAL VERTEBRAL ARTERY ANGIOGRAMS MEDICATIONS: No antibiotics administered. ANESTHESIA/SEDATION: Moderate (conscious) sedation was employed during this procedure. A total of Versed 2 mg and Fentanyl 50 mcg was administered intravenously by the radiology nurse. Total intra-service moderate Sedation Time: 43 minutes. The patient's level of consciousness and vital signs were monitored continuously by radiology nursing throughout the procedure under my direct supervision. FLUOROSCOPY: Radiation Exposure Index (as provided by the fluoroscopic device): 614 mGy Kerma COMPLICATIONS: None immediate. TECHNIQUE: Informed written consent was obtained from the patient after a thorough discussion of the procedural risks, benefits and alternatives. All questions were addressed. Maximal Sterile Barrier Technique was  utilized including caps, mask, sterile gowns, sterile gloves, sterile drape, hand hygiene and skin antiseptic. A timeout was performed prior to the initiation of the procedure. The right groin was prepped and draped in the usual sterile fashion. Using a micropuncture kit and the modified Seldinger technique, access was gained to the right common femoral artery and a 5 French sheath was placed. Real-time ultrasound guidance was utilized for vascular access including the acquisition of a permanent ultrasound image documenting patency of the accessed vessel. Under fluoroscopy, a 5 [place] Berenstein 2 catheter was navigated over 0.035" Terumo Glidewire into the aortic arch. The catheter was placed into the left subclavian artery. Frontal, lateral and multiple oblique angiograms of the neck were obtained. Then, frontal and lateral view of  the head was obtained. The catheter was then placed into the right common carotid artery. Frontal, lateral and bilateral oblique angiograms of the neck were obtained. Then, frontal and lateral angiograms of the head were obtained. The catheter was subsequently placed into the right subclavian artery. Frontal and lateral angiograms of the neck were obtained followed by frontal and lateral angiograms of the head. Next, the catheter was placed into the left common carotid artery. Frontal, lateral and bilateral oblique angiograms of the neck were obtained followed by frontal and lateral angiograms of the head. The catheter was subsequently withdrawn. Right common femoral artery angiogram was obtained in right anterior oblique view. The puncture is at the level of the common femoral artery. The artery has normal caliber, adequate for closure device. A 5 [place] Exoseal was utilized for access closure. Immediate hemostasis was achieved. PROCEDURE: No intervention was performed. FINDINGS: Left subclavian angiograms: Severely calcified plaque in the left subclavian artery extending to the at the origin of the left vertebral artery, resulting in severe stenosis at the origin of the left vertebral artery (85%), without significant stenosis of the subclavian artery. Delayed flow is noted in the left vertebral artery distal to the stenosis. Left vertebral artery angiograms: The intracranial left vertebral artery, basilar artery, and bilateral posterior cerebral arteries are unremarkable. Luminal caliber is smooth and tapering. No aneurysms or abnormally high-flow, early draining veins are seen. No regions of abnormal hypervascularity are noted. The visualized dural sinuses are patent. Right CCA angiograms: Cervical angiograms show prominently calcified plaques along the right carotid bulb resulting in approximately 80% stenosis. Right ICA angiograms: There is brisk vascular contrast filling of the right ACA and MCA vascular  trees. There is also brisk contrast opacification of the right PCA vascular tree via a prominent posterior communicating artery. Luminal caliber is smooth and tapering. No aneurysms or abnormally high-flow, early draining veins are seen. No regions of abnormal hypervascularity are noted. The visualized dural sinuses are patent. The visualized branches of the right external carotid artery are unremarkable. Right subclavian angiograms: Atherosclerotic changes at the origin of the right vertebral artery resulting in approximately 70% stenosis. The right subclavian artery have normal caliber. Right vertebral artery angiograms: Mild atherosclerotic changes at the V4 segment of the right vertebral artery without hemodynamically significant stenosis. The basilar artery, and bilateral posterior cerebral arteries are unremarkable. Luminal caliber is smooth and tapering. No aneurysms or abnormally high-flow, early draining veins are seen. No regions of abnormal hypervascularity are noted. The visualized dural sinuses are patent. Left CCA angiograms: Prominently calcified plaques in the left carotid bifurcation resulting in approximately 80% stenosis. Left ICA angiograms: There is brisk vascular contrast filling of the left ACA and MCA vascular trees. Luminal caliber is smooth and  tapering. No aneurysms or abnormally high-flow, early draining veins are seen. No regions of abnormal hypervascularity are noted. The visualized dural sinuses are patent. The visualized branches of the left external carotid artery are unremarkable. Right common femoral artery angiograms: The access is at the level of the mid right common femoral artery. The femoral artery has normal caliber, adequate for closure device. IMPRESSION: 1. Severe atherosclerotic disease at the origin of the left vertebral artery resulting in approximately 85% stenosis. 2. Severe atherosclerotic disease at the right carotid bifurcation resulting approximately 80% stenosis.  3. Severe atherosclerotic disease at the left carotid bifurcation resulting in approximately 80% stenosis. 4. Atherosclerotic disease at the origin of the right vertebral artery resulting in approximately 70% stenosis. Electronically Signed   By: Pedro Earls M.D.   On: 03/23/2022 14:20   IR ANGIO INTRA EXTRACRAN SEL COM CAROTID INNOMINATE BILAT MOD SED  Result Date: 03/23/2022 INDICATION: 61 year old male with past medical history significant for diabetes, hypertension, hyperlipidemia, migraines, sleep apnea who presented to the ED for evaluation after an episode of ataxic gait, vertigo and nausea. Symptoms resolved after 20 minutes. CT of the head without contrast showed no acute intracranial abnormality. CT angiogram of the head and neck showed multifocal severe stenosis in bilateral carotid bifurcation and origin of the vertebral arteries. Due to densely calcified plaques, decision was in made to proceed with a diagnostic cerebral angiogram to better evaluate the degree of stenosis. EXAM: BILATERAL COMMON CAROTID AND INNOMINATE ANGIOGRAPHY AND BILATERAL VERTEBRAL ARTERY ANGIOGRAMS MEDICATIONS: No antibiotics administered. ANESTHESIA/SEDATION: Moderate (conscious) sedation was employed during this procedure. A total of Versed 2 mg and Fentanyl 50 mcg was administered intravenously by the radiology nurse. Total intra-service moderate Sedation Time: 43 minutes. The patient's level of consciousness and vital signs were monitored continuously by radiology nursing throughout the procedure under my direct supervision. FLUOROSCOPY: Radiation Exposure Index (as provided by the fluoroscopic device): 384 mGy Kerma COMPLICATIONS: None immediate. TECHNIQUE: Informed written consent was obtained from the patient after a thorough discussion of the procedural risks, benefits and alternatives. All questions were addressed. Maximal Sterile Barrier Technique was utilized including caps, mask, sterile gowns,  sterile gloves, sterile drape, hand hygiene and skin antiseptic. A timeout was performed prior to the initiation of the procedure. The right groin was prepped and draped in the usual sterile fashion. Using a micropuncture kit and the modified Seldinger technique, access was gained to the right common femoral artery and a 5 French sheath was placed. Real-time ultrasound guidance was utilized for vascular access including the acquisition of a permanent ultrasound image documenting patency of the accessed vessel. Under fluoroscopy, a 5 [place] Berenstein 2 catheter was navigated over 0.035" Terumo Glidewire into the aortic arch. The catheter was placed into the left subclavian artery. Frontal, lateral and multiple oblique angiograms of the neck were obtained. Then, frontal and lateral view of the head was obtained. The catheter was then placed into the right common carotid artery. Frontal, lateral and bilateral oblique angiograms of the neck were obtained. Then, frontal and lateral angiograms of the head were obtained. The catheter was subsequently placed into the right subclavian artery. Frontal and lateral angiograms of the neck were obtained followed by frontal and lateral angiograms of the head. Next, the catheter was placed into the left common carotid artery. Frontal, lateral and bilateral oblique angiograms of the neck were obtained followed by frontal and lateral angiograms of the head. The catheter was subsequently withdrawn. Right common femoral artery angiogram was  obtained in right anterior oblique view. The puncture is at the level of the common femoral artery. The artery has normal caliber, adequate for closure device. A 5 [place] Exoseal was utilized for access closure. Immediate hemostasis was achieved. PROCEDURE: No intervention was performed. FINDINGS: Left subclavian angiograms: Severely calcified plaque in the left subclavian artery extending to the at the origin of the left vertebral artery,  resulting in severe stenosis at the origin of the left vertebral artery (85%), without significant stenosis of the subclavian artery. Delayed flow is noted in the left vertebral artery distal to the stenosis. Left vertebral artery angiograms: The intracranial left vertebral artery, basilar artery, and bilateral posterior cerebral arteries are unremarkable. Luminal caliber is smooth and tapering. No aneurysms or abnormally high-flow, early draining veins are seen. No regions of abnormal hypervascularity are noted. The visualized dural sinuses are patent. Right CCA angiograms: Cervical angiograms show prominently calcified plaques along the right carotid bulb resulting in approximately 80% stenosis. Right ICA angiograms: There is brisk vascular contrast filling of the right ACA and MCA vascular trees. There is also brisk contrast opacification of the right PCA vascular tree via a prominent posterior communicating artery. Luminal caliber is smooth and tapering. No aneurysms or abnormally high-flow, early draining veins are seen. No regions of abnormal hypervascularity are noted. The visualized dural sinuses are patent. The visualized branches of the right external carotid artery are unremarkable. Right subclavian angiograms: Atherosclerotic changes at the origin of the right vertebral artery resulting in approximately 70% stenosis. The right subclavian artery have normal caliber. Right vertebral artery angiograms: Mild atherosclerotic changes at the V4 segment of the right vertebral artery without hemodynamically significant stenosis. The basilar artery, and bilateral posterior cerebral arteries are unremarkable. Luminal caliber is smooth and tapering. No aneurysms or abnormally high-flow, early draining veins are seen. No regions of abnormal hypervascularity are noted. The visualized dural sinuses are patent. Left CCA angiograms: Prominently calcified plaques in the left carotid bifurcation resulting in approximately  80% stenosis. Left ICA angiograms: There is brisk vascular contrast filling of the left ACA and MCA vascular trees. Luminal caliber is smooth and tapering. No aneurysms or abnormally high-flow, early draining veins are seen. No regions of abnormal hypervascularity are noted. The visualized dural sinuses are patent. The visualized branches of the left external carotid artery are unremarkable. Right common femoral artery angiograms: The access is at the level of the mid right common femoral artery. The femoral artery has normal caliber, adequate for closure device. IMPRESSION: 1. Severe atherosclerotic disease at the origin of the left vertebral artery resulting in approximately 85% stenosis. 2. Severe atherosclerotic disease at the right carotid bifurcation resulting approximately 80% stenosis. 3. Severe atherosclerotic disease at the left carotid bifurcation resulting in approximately 80% stenosis. 4. Atherosclerotic disease at the origin of the right vertebral artery resulting in approximately 70% stenosis. Electronically Signed   By: Pedro Earls M.D.   On: 03/23/2022 14:20   DG CHEST PORT 1 VIEW  Result Date: 03/22/2022 CLINICAL DATA:  Recent TIA EXAM: PORTABLE CHEST 1 VIEW COMPARISON:  10/17/2006 FINDINGS: The heart size and mediastinal contours are within normal limits. Both lungs are clear. The visualized skeletal structures are unremarkable. IMPRESSION: No active disease. Electronically Signed   By: Inez Catalina M.D.   On: 03/22/2022 22:38   MR BRAIN WO CONTRAST  Result Date: 03/22/2022 CLINICAL DATA:  61 year old male TIA. Dizziness. Unsteady gait. Neurologic deficit. EXAM: MRI HEAD WITHOUT CONTRAST TECHNIQUE: Multiplanar, multiecho pulse sequences of  the brain and surrounding structures were obtained without intravenous contrast. COMPARISON:  CTA head and neck earlier today. FINDINGS: Brain: No restricted diffusion to suggest acute infarction. No midline shift, mass effect, evidence  of mass lesion, ventriculomegaly, extra-axial collection or acute intracranial hemorrhage. Cervicomedullary junction and pituitary are within normal limits. Scattered small subcortical white matter T2 and FLAIR hyperintense foci, mild to moderate for age. Occasional periventricular involvement. No cortical encephalomalacia or chronic cerebral blood products identified. Deep gray nuclei, brainstem, and cerebellum are normal for age. Vascular: Major intracranial vascular flow voids are preserved. Skull and upper cervical spine: Negative. Visualized bone marrow signal is within normal limits. Sinuses/Orbits: Negative. Other: Mastoids are clear. Grossly negative visible internal auditory structures. Normal stylomastoid foramina. Negative visible scalp and face. IMPRESSION: 1. No acute intracranial abnormality. 2. Mild to moderate for age nonspecific cerebral white matter signal changes, most commonly due to chronic small vessel disease. Electronically Signed   By: Genevie Ann M.D.   On: 03/22/2022 09:19   CT ANGIO HEAD NECK W WO CM  Result Date: 03/22/2022 CLINICAL DATA:  61 year old male TIA.  Dizziness. EXAM: CT ANGIOGRAPHY HEAD AND NECK TECHNIQUE: Multidetector CT imaging of the head and neck was performed using the standard protocol during bolus administration of intravenous contrast. Multiplanar CT image reconstructions and MIPs were obtained to evaluate the vascular anatomy. Carotid stenosis measurements (when applicable) are obtained utilizing NASCET criteria, using the distal internal carotid diameter as the denominator. RADIATION DOSE REDUCTION: This exam was performed according to the departmental dose-optimization program which includes automated exposure control, adjustment of the mA and/or kV according to patient size and/or use of iterative reconstruction technique. CONTRAST:  53m OMNIPAQUE IOHEXOL 350 MG/ML SOLN COMPARISON:  Plain head CT 03/21/2022. FINDINGS: CTA NECK Skeleton: No acute osseous  abnormality identified. Tympanic cavities and Visualized paranasal sinuses and mastoids are clear. Upper chest: Negative. Other neck: Motion artifact at the soft palate, oropharynx. No acute neck soft tissue finding identified. Aortic arch: Mild arch atherosclerosis. Three vessel arch configuration. Right carotid system: Minimal brachiocephalic artery origin plaque without stenosis. Normal right CCA in total lateral calcified plaque which is just proximal to the bifurcation not resulting in stenosis (series 4, image 115). Patent carotid bifurcation. Bulky calcified plaque at the right ICA origin and bulb. Radiographic string sign stenosis results as seen on series 6 images 207 through 201, along a segment of up to 16 mm. Right ICA remains patent and is otherwise negative to the skull base. Left carotid system: Mild left CCA origin plaque without stenosis. Bulky calcified plaque beginning at the left carotid bifurcation and continuing into the left ICA origin and bulb with high-grade stenosis just before the left ICA origin on series 6, image 217 approaching a radiographic string sign. Origin and bulb calcified plaque then results in less than 50 % stenosis with respect to the distal vessel. Vertebral arteries: Calcified plaque at the right subclavian artery origin without stenosis. Calcified plaque near the right vertebral artery origin,. Additional right V1 (series 6, images 288 and 273) with at least moderate V1 segment stenosis. But the right vertebral artery remains patent to the skull base with no additional plaque. Proximal left subclavian artery calcified plaque with no significant stenosis but bulky and long segment nearly 2.5 cm calcified plaque throughout the proximal left vertebral artery (series 7, image 128) with high-grade stenosis. However, the left vertebral is mildly dominant and remains patent. No additional plaque or stenosis to the skull base. CTA HEAD Posterior circulation:  Mildly dominant left  vertebral V4 segment with normal left PICA origin and vertebrobasilar junction. No distal left vertebral plaque or stenosis. Contralateral right vertebral V4 segment moderate calcified plaque and stenosis on series 6, image 147. But the right vertebral remains patent to the vertebrobasilar junction. The right AICA appears dominant. Patent basilar artery with mild irregularity. No significant basilar stenosis. Patent SCA and PCA origins. Right posterior communicating artery is present. Bilateral PCA branches are patent with mild irregularity. Anterior circulation: Both ICA siphons are patent. Only mild supraclinoid ICA calcified plaque. No significant ICA siphon stenosis. Patent carotid termini. Patent MCA and ACA origins. Normal anterior communicating artery. Bilateral ACA branches are within normal limits. Left MCA M1 segment and bifurcation are patent without stenosis. Left MCA branches appear mildly irregular. Right MCA M1 segment is tortuous and bifurcates early without stenosis. Right MCA branches appear patent with mild irregularity. Venous sinuses: Early contrast timing, grossly patent. Anatomic variants: Mildly dominant left vertebral artery. Review of the MIP images confirms the above findings IMPRESSION: 1. Negative for large vessel occlusion, but positive for bulky calcified plaque at both carotid bifurcations and the proximal left vertebral artery: - Right ICA origin and bulb RADIOGRAPHIC STRING SIGN. - proximal Left Vertebral Artery RADIOGRAPHIC STRING SIGN. - Left Carotid Bifurcation High-grade Stenosis, approaching a radiographic string sign. 2. Also moderate Right Vertebral Artery V1 and V4 segment stenoses due to plaque. 3. Aortic Atherosclerosis (ICD10-I70.0). Electronically Signed   By: Genevie Ann M.D.   On: 03/22/2022 05:12   CT HEAD WO CONTRAST  Result Date: 03/21/2022 CLINICAL DATA:  Dizziness. EXAM: CT HEAD WITHOUT CONTRAST TECHNIQUE: Contiguous axial images were obtained from the base of the  skull through the vertex without intravenous contrast. RADIATION DOSE REDUCTION: This exam was performed according to the departmental dose-optimization program which includes automated exposure control, adjustment of the mA and/or kV according to patient size and/or use of iterative reconstruction technique. COMPARISON:  None Available. FINDINGS: Brain: No evidence of acute infarction, hemorrhage, hydrocephalus, extra-axial collection or mass lesion/mass effect. Vascular: No hyperdense vessel or unexpected calcification. Skull: Normal. Negative for fracture or focal lesion. Sinuses/Orbits: There is mild right ethmoid sinus mucosal thickening. Other: None. IMPRESSION: No acute intracranial abnormality. Electronically Signed   By: Virgina Norfolk M.D.   On: 03/21/2022 19:58    Labs:  CBC: Recent Labs    11/01/21 1143 03/21/22 1929 03/22/22 2220 03/23/22 0240  WBC 7.5 8.2 7.0 8.1  HGB 15.5 14.5 14.7 14.2  HCT 44.1 41.1 40.1 38.9*  PLT 236 204 209 194    COAGS: Recent Labs    03/21/22 1929  INR 1.1  APTT 28    BMP: Recent Labs    11/01/21 1143 03/21/22 1929 03/22/22 2220 03/23/22 0240  NA 138 131* 129* 130*  K 4.3 4.2 4.0 3.9  CL 97 98 98 100  CO2 '22 24 23 22  ' GLUCOSE 54* 153* 171* 100*  BUN '13 15 11 10  ' CALCIUM 9.2 8.8* 8.5* 8.5*  CREATININE 1.08 1.02 1.03 0.99  GFRNONAA  --  >60 >60 >60    LIVER FUNCTION TESTS: Recent Labs    11/01/21 1143 03/21/22 1929 03/22/22 2220 03/23/22 0240  BILITOT 0.7 1.8* 1.8* 1.5*  AST 40 36 33 30  ALT 48* 41 45* 40  ALKPHOS 77 69 60 57  PROT 6.7 7.4 6.6 6.3*  ALBUMIN 4.6 4.3 3.6 3.4*     Assessment and Plan:  61 y.o. male inpatient. History of DM, HTN, HLD,  migraine headaches, sleep apnea. Patent presented to the ED at [facility] on 8.16.23  One episode of unsteady gait, diaphoresis and dizziness that self resolved.  Code stroke was called at the Patient was transferred to [facility] for further evaluation. Cerebral angiogram performed on  8.17.23. by Dr. Raliegh Ip. Karenann Cai found to have severe sclerosis in the left verbal artery, right carotid bifurcation, left carotid bifurcation and right vertebral artery. Patient presents for cerebral angiogram with intervention with  general anesthesia.   Loading dose of Plavix 335m and aspirin 834mgiven on 8.17.23.  Plavix 75 mg given 8.18 @ 07:05.  Sodium 130, Total protein 6.3, Allergies include Codeine.Allergies include Codeine. Patient has been NPO since midnight.  Risks and benefits of cerebral arteriogram with intervention were discussed with the patient including, but not limited to bleeding, infection, vascular injury, contrast induced renal failure, stroke, reperfusion hemorrhage, or even death. This interventional procedure involves the use of X-rays and because of the nature of the planned procedure, it is possible that we will have prolonged use of X-ray fluoroscopy. Potential radiation risks to you include (but are not limited to) the following: - A slightly elevated risk for cancer  several years later in life. This risk is typically less than 0.5% percent. This risk is low in comparison to the normal incidence of human cancer, which is 33% for women and 50% for men according to the [employer]- Radiation induced injury can include skin redness, resembling a rash, tissue breakdown / ulcers and hair loss (which can be temporary or permanent).  The likelihood of either of these occurring depends on the difficulty of the procedure and whether you are sensitive to radiation due to previous procedures, disease, or genetic conditions.  IF your procedure requires a prolonged use of radiation, you will be notified and given written instructions for further action.  It is your responsibility to monitor the irradiated area for the 2 weeks following the procedure and to notify your physician if you are concerned that you have suffered a radiation induced injury.   All of the  patient's questions were answered, patient is agreeable to proceed. Consent signed and in chart.   Thank you for this interesting consult.  I greatly enjoyed meeting Calvin NOUnd look forward to participating in their care.  A copy of this report was sent to the requesting provider on this date.  Electronically Signed: JeJacqualine MauNP 03/24/2022, 8:07 AM   I spent a total of 40 Minutes    in face to face in clinical consultation, greater than 50% of which was counseling/coordinating care for cerebral angiogram with intervention

## 2022-03-24 NOTE — Anesthesia Procedure Notes (Signed)
Procedure Name: Intubation Date/Time: 03/24/2022 9:00 AM  Performed by: Colin Benton, CRNAPre-anesthesia Checklist: Patient identified, Emergency Drugs available, Suction available and Patient being monitored Patient Re-evaluated:Patient Re-evaluated prior to induction Oxygen Delivery Method: Circle system utilized Preoxygenation: Pre-oxygenation with 100% oxygen Induction Type: IV induction Ventilation: Mask ventilation without difficulty and Oral airway inserted - appropriate to patient size Laryngoscope Size: Sabra Heck and 2 Grade View: Grade II Tube type: Oral Tube size: 7.5 mm Number of attempts: 1 Airway Equipment and Method: Stylet and Oral airway Placement Confirmation: ETT inserted through vocal cords under direct vision, positive ETCO2 and breath sounds checked- equal and bilateral Secured at: 23 cm Tube secured with: Tape Dental Injury: Teeth and Oropharynx as per pre-operative assessment

## 2022-03-24 NOTE — Progress Notes (Signed)
  Patient seen on post procedure rounds.  Remains flat in bed. Has 2 more hours of straight leg time.  Awake and alert, oriented.  Groin ok so far.  Wife at bedside  A-line removed due to not correlating with cuff.  Urine output good.  No gross neurologic deficits.  Plan:  Probably D/C home in am as long as he continues to do well.  Ramona Ruark S Dicie Edelen PA-C 03/24/2022 2:20 PM

## 2022-03-24 NOTE — Anesthesia Preprocedure Evaluation (Signed)
Anesthesia Evaluation  Patient identified by MRN, date of birth, ID band Patient awake    Reviewed: Allergy & Precautions, NPO status , Patient's Chart, lab work & pertinent test results  Airway Mallampati: II  TM Distance: >3 FB Neck ROM: Full    Dental  (+) Dental Advisory Given   Pulmonary sleep apnea ,    breath sounds clear to auscultation       Cardiovascular hypertension, Pt. on medications  Rhythm:Regular Rate:Normal     Neuro/Psych  Headaches, TIA   GI/Hepatic Neg liver ROS, GERD  ,  Endo/Other  diabetes, Type 2, Oral Hypoglycemic Agents, Insulin Dependent  Renal/GU negative Renal ROS     Musculoskeletal   Abdominal   Peds  Hematology negative hematology ROS (+)   Anesthesia Other Findings   Reproductive/Obstetrics                             Lab Results  Component Value Date   WBC 8.1 03/23/2022   HGB 14.2 03/23/2022   HCT 38.9 (L) 03/23/2022   MCV 88.8 03/23/2022   PLT 194 03/23/2022   Lab Results  Component Value Date   CREATININE 0.99 03/23/2022   BUN 10 03/23/2022   NA 130 (L) 03/23/2022   K 3.9 03/23/2022   CL 100 03/23/2022   CO2 22 03/23/2022    Anesthesia Physical Anesthesia Plan  ASA: 4  Anesthesia Plan: General   Post-op Pain Management: Tylenol PO (pre-op)*   Induction: Intravenous and Rapid sequence  PONV Risk Score and Plan: 2 and Dexamethasone, Ondansetron and Treatment may vary due to age or medical condition  Airway Management Planned: Oral ETT  Additional Equipment: Arterial line  Intra-op Plan:   Post-operative Plan: Extubation in OR and Possible Post-op intubation/ventilation  Informed Consent: I have reviewed the patients History and Physical, chart, labs and discussed the procedure including the risks, benefits and alternatives for the proposed anesthesia with the patient or authorized representative who has indicated his/her  understanding and acceptance.     Dental advisory given  Plan Discussed with: CRNA  Anesthesia Plan Comments:         Anesthesia Quick Evaluation

## 2022-03-24 NOTE — Anesthesia Procedure Notes (Signed)
Arterial Line Insertion Start/End8/18/2023 8:10 AM, 03/24/2022 8:20 AM Performed by: Colin Benton, CRNA, CRNA  Patient location: Pre-op. Preanesthetic checklist: patient identified, IV checked, site marked, risks and benefits discussed, surgical consent, monitors and equipment checked, pre-op evaluation, timeout performed and anesthesia consent Lidocaine 1% used for infiltration Left, radial was placed Catheter size: 20 G Hand hygiene performed , maximum sterile barriers used  and Seldinger technique used Allen's test indicative of satisfactory collateral circulation Attempts: 2 Procedure performed without using ultrasound guided technique. Following insertion, dressing applied and Biopatch. Post procedure assessment: normal and unchanged  Patient tolerated the procedure well with no immediate complications.

## 2022-03-24 NOTE — Progress Notes (Signed)
PROGRESS NOTE    JOMAR DENZ  XEN:[id number] DOB: 12/10/1960 DOA: 03/22/2022 PCP: [name], MD    Brief Narrative:  Patient with type 2 diabetes, hypertension hyperlipidemia and migraine headaches and sleep apnea presented to [facility] ER with episode of staggering and unsteady gait sudden onset.  Stroke work-up was done.  Negative MRI.  He was found to have bulky calcific plaque at both carotid bifurcations and proximal left vertebral artery, radiographically string sign right ICA and left vertebral artery.  Transferred to [facility] for neuroradiology intervention. 8/17, four-vessel angiogram found to have severe stenosis in the left vertebral, right carotid bifurcation, left carotid bifurcation and right vertebral artery. 8/18, for angiogram under anesthesia and transferred to neuro ICU for observation.   Assessment & Plan:   TIA/acute vertebral insufficiency: Clinical findings, difficulty gait.  Clinically improved.  Neurologically normal now. CT head findings, normal. MRI of the brain, no findings. CT angiogram of the head and neck with significant stenosis. Antiplatelet therapy, on aspirin and Plavix. LDL 62, at goal.  Patient on atorvastatin 80 mg daily. Hemoglobin A1c, 6.1.  On insulin. Neurology following. CT angiogram with multiple stenoses as above. Underwent angiogram, stenting today. PT/OT/speech, no deficits identified.  Type 2 diabetes, well controlled with hyperglycemia: Hemoglobin A1c 6.1.  Fairly controlled.  Continue home doses of insulin.  Essential hypertension: Blood pressure stable.  Permissive hypertension allowed due to significant flow obstruction.     DVT prophylaxis: SCD's Start: 03/22/22 2203   Code Status: Full code Family Communication: Multiple family members at the bedside. Disposition Plan: Status is: Inpatient.  Remains inpatient.  Inpatient procedures planned.  Consultants:  Neurology Interventional radiology  Procedures:   None  Antimicrobials:  None   Subjective: Patient seen and examined.  He came back from procedure and was transferred to neuro ICU today.  Denies any complaints.  He was happy to have the procedure done.  Some soreness on the right groin expected.  Objective: Vitals:   03/24/22 1145 03/24/22 1200 03/24/22 1215 03/24/22 1230  BP: 107/68 115/69 119/75 119/71  Pulse: 89 90 89 86  Resp: '18 18 19 19  ' Temp:   98.8 F (37.1 C)   TempSrc:   Oral   SpO2: 95% 94% 93% 95%  Weight:      Height:        Intake/Output Summary (Last 24 hours) at 03/24/2022 1256 Last data filed at 03/24/2022 1200 Gross per 24 hour  Intake 1807.5 ml  Output 615 ml  Net 1192.5 ml   Filed Weights   03/22/22 2146  Weight: 84.8 kg    Examination:  General: Looks fairly comfortable. Cardiovascular: S1-S2 normal. Respiratory: Bilateral clear.. Gastrointestinal: No rate sounds. Ext: No swelling or edema.  No deformities. Neuro: Intact.     Data Reviewed: I have personally reviewed following labs and imaging studies  CBC: Recent Labs  Lab 03/21/22 1929 03/22/22 2220 03/23/22 0240  WBC 8.2 7.0 8.1  NEUTROABS 5.0 4.0  --   HGB 14.5 14.7 14.2  HCT 41.1 40.1 38.9*  MCV 90.3 89.1 88.8  PLT 204 209 103   Basic Metabolic Panel: Recent Labs  Lab 03/21/22 1929 03/22/22 2220 03/23/22 0240  NA 131* 129* 130*  K 4.2 4.0 3.9  CL 98 98 100  CO2 '24 23 22  ' GLUCOSE 153* 171* 100*  BUN '15 11 10  ' CREATININE 1.02 1.03 0.99  CALCIUM 8.8* 8.5* 8.5*  MG  --  2.0  --    GFR:  Estimated Creatinine Clearance: 80 mL/min (by C-G formula based on SCr of 0.99 mg/dL). Liver Function Tests: Recent Labs  Lab 03/21/22 1929 03/22/22 2220 03/23/22 0240  AST 36 33 30  ALT 41 45* 40  ALKPHOS 69 60 57  BILITOT 1.8* 1.8* 1.5*  PROT 7.4 6.6 6.3*  ALBUMIN 4.3 3.6 3.4*   No results for input(s): "LIPASE", "AMYLASE" in the last 168 hours. No results for input(s): "AMMONIA" in the last 168 hours. Coagulation  Profile: Recent Labs  Lab 03/21/22 1929  INR 1.1   Cardiac Enzymes: No results for input(s): "CKTOTAL", "CKMB", "CKMBINDEX", "TROPONINI" in the last 168 hours. BNP (last 3 results) No results for input(s): "PROBNP" in the last 8760 hours. HbA1C: Recent Labs    03/21/22 1929  HGBA1C 6.1*   CBG: Recent Labs  Lab 03/23/22 2326 03/24/22 0324 03/24/22 0711 03/24/22 0914 03/24/22 1047  GLUCAP 120* 132* 130* 118* 113*   Lipid Profile: Recent Labs    03/22/22 2220  CHOL 113  HDL 32*  LDLCALC 62  TRIG 95  CHOLHDL 3.5   Thyroid Function Tests: Recent Labs    03/22/22 2220  TSH 2.541   Anemia Panel: No results for input(s): "VITAMINB12", "FOLATE", "FERRITIN", "TIBC", "IRON", "RETICCTPCT" in the last 72 hours. Sepsis Labs: No results for input(s): "PROCALCITON", "LATICACIDVEN" in the last 168 hours.  No results found for this or any previous visit (from the past 240 hour(s)).       Radiology Studies: IR US Guide Vasc Access Right  Result Date: 03/23/2022 INDICATION: 61 year old male with past medical history significant for diabetes, hypertension, hyperlipidemia, migraines, sleep apnea who presented to the ED for evaluation after an episode of ataxic gait, vertigo and nausea. Symptoms resolved after 20 minutes. CT of the head without contrast showed no acute intracranial abnormality. CT angiogram of the head and neck showed multifocal severe stenosis in bilateral carotid bifurcation and origin of the vertebral arteries. Due to densely calcified plaques, decision was in made to proceed with a diagnostic cerebral angiogram to better evaluate the degree of stenosis. EXAM: BILATERAL COMMON CAROTID AND INNOMINATE ANGIOGRAPHY AND BILATERAL VERTEBRAL ARTERY ANGIOGRAMS MEDICATIONS: No antibiotics administered. ANESTHESIA/SEDATION: Moderate (conscious) sedation was employed during this procedure. A total of Versed 2 mg and Fentanyl 50 mcg was administered intravenously by the  radiology nurse. Total intra-service moderate Sedation Time: 43 minutes. The patient's level of consciousness and vital signs were monitored continuously by radiology nursing throughout the procedure under my direct supervision. FLUOROSCOPY: Radiation Exposure Index (as provided by the fluoroscopic device): 433 mGy Kerma COMPLICATIONS: None immediate. TECHNIQUE: Informed written consent was obtained from the patient after a thorough discussion of the procedural risks, benefits and alternatives. All questions were addressed. Maximal Sterile Barrier Technique was utilized including caps, mask, sterile gowns, sterile gloves, sterile drape, hand hygiene and skin antiseptic. A timeout was performed prior to the initiation of the procedure. The right groin was prepped and draped in the usual sterile fashion. Using a micropuncture kit and the modified Seldinger technique, access was gained to the right common femoral artery and a 5 French sheath was placed. Real-time ultrasound guidance was utilized for vascular access including the acquisition of a permanent ultrasound image documenting patency of the accessed vessel. Under fluoroscopy, a 5 [place] Berenstein 2 catheter was navigated over 0.035" Terumo Glidewire into the aortic arch. The catheter was placed into the left subclavian artery. Frontal, lateral and multiple oblique angiograms of the neck were obtained. Then, frontal and lateral view of  the head was obtained. The catheter was then placed into the right common carotid artery. Frontal, lateral and bilateral oblique angiograms of the neck were obtained. Then, frontal and lateral angiograms of the head were obtained. The catheter was subsequently placed into the right subclavian artery. Frontal and lateral angiograms of the neck were obtained followed by frontal and lateral angiograms of the head. Next, the catheter was placed into the left common carotid artery. Frontal, lateral and bilateral oblique angiograms of  the neck were obtained followed by frontal and lateral angiograms of the head. The catheter was subsequently withdrawn. Right common femoral artery angiogram was obtained in right anterior oblique view. The puncture is at the level of the common femoral artery. The artery has normal caliber, adequate for closure device. A 5 [place] Exoseal was utilized for access closure. Immediate hemostasis was achieved. PROCEDURE: No intervention was performed. FINDINGS: Left subclavian angiograms: Severely calcified plaque in the left subclavian artery extending to the at the origin of the left vertebral artery, resulting in severe stenosis at the origin of the left vertebral artery (85%), without significant stenosis of the subclavian artery. Delayed flow is noted in the left vertebral artery distal to the stenosis. Left vertebral artery angiograms: The intracranial left vertebral artery, basilar artery, and bilateral posterior cerebral arteries are unremarkable. Luminal caliber is smooth and tapering. No aneurysms or abnormally high-flow, early draining veins are seen. No regions of abnormal hypervascularity are noted. The visualized dural sinuses are patent. Right CCA angiograms: Cervical angiograms show prominently calcified plaques along the right carotid bulb resulting in approximately 80% stenosis. Right ICA angiograms: There is brisk vascular contrast filling of the right ACA and MCA vascular trees. There is also brisk contrast opacification of the right PCA vascular tree via a prominent posterior communicating artery. Luminal caliber is smooth and tapering. No aneurysms or abnormally high-flow, early draining veins are seen. No regions of abnormal hypervascularity are noted. The visualized dural sinuses are patent. The visualized branches of the right external carotid artery are unremarkable. Right subclavian angiograms: Atherosclerotic changes at the origin of the right vertebral artery resulting in approximately 70%  stenosis. The right subclavian artery have normal caliber. Right vertebral artery angiograms: Mild atherosclerotic changes at the V4 segment of the right vertebral artery without hemodynamically significant stenosis. The basilar artery, and bilateral posterior cerebral arteries are unremarkable. Luminal caliber is smooth and tapering. No aneurysms or abnormally high-flow, early draining veins are seen. No regions of abnormal hypervascularity are noted. The visualized dural sinuses are patent. Left CCA angiograms: Prominently calcified plaques in the left carotid bifurcation resulting in approximately 80% stenosis. Left ICA angiograms: There is brisk vascular contrast filling of the left ACA and MCA vascular trees. Luminal caliber is smooth and tapering. No aneurysms or abnormally high-flow, early draining veins are seen. No regions of abnormal hypervascularity are noted. The visualized dural sinuses are patent. The visualized branches of the left external carotid artery are unremarkable. Right common femoral artery angiograms: The access is at the level of the mid right common femoral artery. The femoral artery has normal caliber, adequate for closure device. IMPRESSION: 1. Severe atherosclerotic disease at the origin of the left vertebral artery resulting in approximately 85% stenosis. 2. Severe atherosclerotic disease at the right carotid bifurcation resulting approximately 80% stenosis. 3. Severe atherosclerotic disease at the left carotid bifurcation resulting in approximately 80% stenosis. 4. Atherosclerotic disease at the origin of the right vertebral artery resulting in approximately 70% stenosis. Electronically Signed   By:  Pedro Earls M.D.   On: 03/23/2022 14:20   IR ANGIO VERTEBRAL SEL SUBCLAVIAN INNOMINATE BILAT MOD SED  Result Date: 03/23/2022 INDICATION: 61 year old male with past medical history significant for diabetes, hypertension, hyperlipidemia, migraines, sleep apnea who  presented to the ED for evaluation after an episode of ataxic gait, vertigo and nausea. Symptoms resolved after 20 minutes. CT of the head without contrast showed no acute intracranial abnormality. CT angiogram of the head and neck showed multifocal severe stenosis in bilateral carotid bifurcation and origin of the vertebral arteries. Due to densely calcified plaques, decision was in made to proceed with a diagnostic cerebral angiogram to better evaluate the degree of stenosis. EXAM: BILATERAL COMMON CAROTID AND INNOMINATE ANGIOGRAPHY AND BILATERAL VERTEBRAL ARTERY ANGIOGRAMS MEDICATIONS: No antibiotics administered. ANESTHESIA/SEDATION: Moderate (conscious) sedation was employed during this procedure. A total of Versed 2 mg and Fentanyl 50 mcg was administered intravenously by the radiology nurse. Total intra-service moderate Sedation Time: 43 minutes. The patient's level of consciousness and vital signs were monitored continuously by radiology nursing throughout the procedure under my direct supervision. FLUOROSCOPY: Radiation Exposure Index (as provided by the fluoroscopic device): 825 mGy Kerma COMPLICATIONS: None immediate. TECHNIQUE: Informed written consent was obtained from the patient after a thorough discussion of the procedural risks, benefits and alternatives. All questions were addressed. Maximal Sterile Barrier Technique was utilized including caps, mask, sterile gowns, sterile gloves, sterile drape, hand hygiene and skin antiseptic. A timeout was performed prior to the initiation of the procedure. The right groin was prepped and draped in the usual sterile fashion. Using a micropuncture kit and the modified Seldinger technique, access was gained to the right common femoral artery and a 5 French sheath was placed. Real-time ultrasound guidance was utilized for vascular access including the acquisition of a permanent ultrasound image documenting patency of the accessed vessel. Under fluoroscopy, a 5  [place] Berenstein 2 catheter was navigated over 0.035" Terumo Glidewire into the aortic arch. The catheter was placed into the left subclavian artery. Frontal, lateral and multiple oblique angiograms of the neck were obtained. Then, frontal and lateral view of the head was obtained. The catheter was then placed into the right common carotid artery. Frontal, lateral and bilateral oblique angiograms of the neck were obtained. Then, frontal and lateral angiograms of the head were obtained. The catheter was subsequently placed into the right subclavian artery. Frontal and lateral angiograms of the neck were obtained followed by frontal and lateral angiograms of the head. Next, the catheter was placed into the left common carotid artery. Frontal, lateral and bilateral oblique angiograms of the neck were obtained followed by frontal and lateral angiograms of the head. The catheter was subsequently withdrawn. Right common femoral artery angiogram was obtained in right anterior oblique view. The puncture is at the level of the common femoral artery. The artery has normal caliber, adequate for closure device. A 5 [place] Exoseal was utilized for access closure. Immediate hemostasis was achieved. PROCEDURE: No intervention was performed. FINDINGS: Left subclavian angiograms: Severely calcified plaque in the left subclavian artery extending to the at the origin of the left vertebral artery, resulting in severe stenosis at the origin of the left vertebral artery (85%), without significant stenosis of the subclavian artery. Delayed flow is noted in the left vertebral artery distal to the stenosis. Left vertebral artery angiograms: The intracranial left vertebral artery, basilar artery, and bilateral posterior cerebral arteries are unremarkable. Luminal caliber is smooth and tapering. No aneurysms or abnormally high-flow, early draining  veins are seen. No regions of abnormal hypervascularity are noted. The visualized dural sinuses  are patent. Right CCA angiograms: Cervical angiograms show prominently calcified plaques along the right carotid bulb resulting in approximately 80% stenosis. Right ICA angiograms: There is brisk vascular contrast filling of the right ACA and MCA vascular trees. There is also brisk contrast opacification of the right PCA vascular tree via a prominent posterior communicating artery. Luminal caliber is smooth and tapering. No aneurysms or abnormally high-flow, early draining veins are seen. No regions of abnormal hypervascularity are noted. The visualized dural sinuses are patent. The visualized branches of the right external carotid artery are unremarkable. Right subclavian angiograms: Atherosclerotic changes at the origin of the right vertebral artery resulting in approximately 70% stenosis. The right subclavian artery have normal caliber. Right vertebral artery angiograms: Mild atherosclerotic changes at the V4 segment of the right vertebral artery without hemodynamically significant stenosis. The basilar artery, and bilateral posterior cerebral arteries are unremarkable. Luminal caliber is smooth and tapering. No aneurysms or abnormally high-flow, early draining veins are seen. No regions of abnormal hypervascularity are noted. The visualized dural sinuses are patent. Left CCA angiograms: Prominently calcified plaques in the left carotid bifurcation resulting in approximately 80% stenosis. Left ICA angiograms: There is brisk vascular contrast filling of the left ACA and MCA vascular trees. Luminal caliber is smooth and tapering. No aneurysms or abnormally high-flow, early draining veins are seen. No regions of abnormal hypervascularity are noted. The visualized dural sinuses are patent. The visualized branches of the left external carotid artery are unremarkable. Right common femoral artery angiograms: The access is at the level of the mid right common femoral artery. The femoral artery has normal caliber,  adequate for closure device. IMPRESSION: 1. Severe atherosclerotic disease at the origin of the left vertebral artery resulting in approximately 85% stenosis. 2. Severe atherosclerotic disease at the right carotid bifurcation resulting approximately 80% stenosis. 3. Severe atherosclerotic disease at the left carotid bifurcation resulting in approximately 80% stenosis. 4. Atherosclerotic disease at the origin of the right vertebral artery resulting in approximately 70% stenosis. Electronically Signed   By: Pedro Earls M.D.   On: 03/23/2022 14:20   IR ANGIO INTRA EXTRACRAN SEL COM CAROTID INNOMINATE BILAT MOD SED  Result Date: 03/23/2022 INDICATION: 61 year old male with past medical history significant for diabetes, hypertension, hyperlipidemia, migraines, sleep apnea who presented to the ED for evaluation after an episode of ataxic gait, vertigo and nausea. Symptoms resolved after 20 minutes. CT of the head without contrast showed no acute intracranial abnormality. CT angiogram of the head and neck showed multifocal severe stenosis in bilateral carotid bifurcation and origin of the vertebral arteries. Due to densely calcified plaques, decision was in made to proceed with a diagnostic cerebral angiogram to better evaluate the degree of stenosis. EXAM: BILATERAL COMMON CAROTID AND INNOMINATE ANGIOGRAPHY AND BILATERAL VERTEBRAL ARTERY ANGIOGRAMS MEDICATIONS: No antibiotics administered. ANESTHESIA/SEDATION: Moderate (conscious) sedation was employed during this procedure. A total of Versed 2 mg and Fentanyl 50 mcg was administered intravenously by the radiology nurse. Total intra-service moderate Sedation Time: 43 minutes. The patient's level of consciousness and vital signs were monitored continuously by radiology nursing throughout the procedure under my direct supervision. FLUOROSCOPY: Radiation Exposure Index (as provided by the fluoroscopic device): 045 mGy Kerma COMPLICATIONS: None  immediate. TECHNIQUE: Informed written consent was obtained from the patient after a thorough discussion of the procedural risks, benefits and alternatives. All questions were addressed. Maximal Sterile Barrier Technique was utilized including  caps, mask, sterile gowns, sterile gloves, sterile drape, hand hygiene and skin antiseptic. A timeout was performed prior to the initiation of the procedure. The right groin was prepped and draped in the usual sterile fashion. Using a micropuncture kit and the modified Seldinger technique, access was gained to the right common femoral artery and a 5 French sheath was placed. Real-time ultrasound guidance was utilized for vascular access including the acquisition of a permanent ultrasound image documenting patency of the accessed vessel. Under fluoroscopy, a 5 [place] Berenstein 2 catheter was navigated over 0.035" Terumo Glidewire into the aortic arch. The catheter was placed into the left subclavian artery. Frontal, lateral and multiple oblique angiograms of the neck were obtained. Then, frontal and lateral view of the head was obtained. The catheter was then placed into the right common carotid artery. Frontal, lateral and bilateral oblique angiograms of the neck were obtained. Then, frontal and lateral angiograms of the head were obtained. The catheter was subsequently placed into the right subclavian artery. Frontal and lateral angiograms of the neck were obtained followed by frontal and lateral angiograms of the head. Next, the catheter was placed into the left common carotid artery. Frontal, lateral and bilateral oblique angiograms of the neck were obtained followed by frontal and lateral angiograms of the head. The catheter was subsequently withdrawn. Right common femoral artery angiogram was obtained in right anterior oblique view. The puncture is at the level of the common femoral artery. The artery has normal caliber, adequate for closure device. A 5 [place] Exoseal  was utilized for access closure. Immediate hemostasis was achieved. PROCEDURE: No intervention was performed. FINDINGS: Left subclavian angiograms: Severely calcified plaque in the left subclavian artery extending to the at the origin of the left vertebral artery, resulting in severe stenosis at the origin of the left vertebral artery (85%), without significant stenosis of the subclavian artery. Delayed flow is noted in the left vertebral artery distal to the stenosis. Left vertebral artery angiograms: The intracranial left vertebral artery, basilar artery, and bilateral posterior cerebral arteries are unremarkable. Luminal caliber is smooth and tapering. No aneurysms or abnormally high-flow, early draining veins are seen. No regions of abnormal hypervascularity are noted. The visualized dural sinuses are patent. Right CCA angiograms: Cervical angiograms show prominently calcified plaques along the right carotid bulb resulting in approximately 80% stenosis. Right ICA angiograms: There is brisk vascular contrast filling of the right ACA and MCA vascular trees. There is also brisk contrast opacification of the right PCA vascular tree via a prominent posterior communicating artery. Luminal caliber is smooth and tapering. No aneurysms or abnormally high-flow, early draining veins are seen. No regions of abnormal hypervascularity are noted. The visualized dural sinuses are patent. The visualized branches of the right external carotid artery are unremarkable. Right subclavian angiograms: Atherosclerotic changes at the origin of the right vertebral artery resulting in approximately 70% stenosis. The right subclavian artery have normal caliber. Right vertebral artery angiograms: Mild atherosclerotic changes at the V4 segment of the right vertebral artery without hemodynamically significant stenosis. The basilar artery, and bilateral posterior cerebral arteries are unremarkable. Luminal caliber is smooth and tapering. No  aneurysms or abnormally high-flow, early draining veins are seen. No regions of abnormal hypervascularity are noted. The visualized dural sinuses are patent. Left CCA angiograms: Prominently calcified plaques in the left carotid bifurcation resulting in approximately 80% stenosis. Left ICA angiograms: There is brisk vascular contrast filling of the left ACA and MCA vascular trees. Luminal caliber is smooth and tapering. No  aneurysms or abnormally high-flow, early draining veins are seen. No regions of abnormal hypervascularity are noted. The visualized dural sinuses are patent. The visualized branches of the left external carotid artery are unremarkable. Right common femoral artery angiograms: The access is at the level of the mid right common femoral artery. The femoral artery has normal caliber, adequate for closure device. IMPRESSION: 1. Severe atherosclerotic disease at the origin of the left vertebral artery resulting in approximately 85% stenosis. 2. Severe atherosclerotic disease at the right carotid bifurcation resulting approximately 80% stenosis. 3. Severe atherosclerotic disease at the left carotid bifurcation resulting in approximately 80% stenosis. 4. Atherosclerotic disease at the origin of the right vertebral artery resulting in approximately 70% stenosis. Electronically Signed   By: Pedro Earls M.D.   On: 03/23/2022 14:20   DG CHEST PORT 1 VIEW  Result Date: 03/22/2022 CLINICAL DATA:  Recent TIA EXAM: PORTABLE CHEST 1 VIEW COMPARISON:  10/17/2006 FINDINGS: The heart size and mediastinal contours are within normal limits. Both lungs are clear. The visualized skeletal structures are unremarkable. IMPRESSION: No active disease. Electronically Signed   By: Inez Catalina M.D.   On: 03/22/2022 22:38        Scheduled Meds:  aspirin EC  81 mg Oral Daily   atorvastatin  80 mg Oral Daily   Chlorhexidine Gluconate Cloth  6 each Topical Daily   clopidogrel  75 mg Oral Daily    insulin aspart  0-9 Units Subcutaneous Q4H   insulin glargine-yfgn  30 Units Subcutaneous BID   pantoprazole  40 mg Oral Daily   Continuous Infusions:  sodium chloride 75 mL/hr at 03/24/22 1230   clevidipine       LOS: 2 days    Time spent: 35 minutes    Barb Merino, MD [facility] Pager [phone]

## 2022-03-24 NOTE — Progress Notes (Signed)
Patient transported off unit to Short Stay for pre-procedure.  Wife updated on patient leaving off unit.

## 2022-03-25 ENCOUNTER — Other Ambulatory Visit: Payer: Self-pay | Admitting: Neurology

## 2022-03-25 DIAGNOSIS — G459 Transient cerebral ischemic attack, unspecified: Secondary | ICD-10-CM

## 2022-03-25 LAB — GLUCOSE, CAPILLARY
Glucose-Capillary: 149 mg/dL — ABNORMAL HIGH (ref 70–99)
Glucose-Capillary: 104 mg/dL — ABNORMAL HIGH (ref 70–99)

## 2022-03-25 NOTE — Progress Notes (Signed)
STROKE TEAM PROGRESS NOTE   INTERVAL HISTORY His wife and RN and Dr. Norma Fredrickson are at bedside.  Patient reclining in bed, denies any dizziness or imbalance on walking.  He stated that when he arrived in ED, his symptoms largely resolved.  He had left vertebral artery stenting yesterday with Dr. Norma Fredrickson, tolerating well.  Currently neuro intact.  P2 Y12 still pending.  On aspirin and Plavix.  Vitals:   03/25/22 0700 03/25/22 0800 03/25/22 1119 03/25/22 1130  BP: 127/79 (!) 150/87 (!) 142/91 (!) 148/87  Pulse: 86 (!) 104    Resp: 17 20    Temp:  100 F (37.8 C)    TempSrc:  Axillary    SpO2: 97% 97%    Weight:      Height:       CBC:  Recent Labs  Lab 03/21/22 1929 03/22/22 2220 03/23/22 0240  WBC 8.2 7.0 8.1  NEUTROABS 5.0 4.0  --   HGB 14.5 14.7 14.2  HCT 41.1 40.1 38.9*  MCV 90.3 89.1 88.8  PLT 204 209 875   Basic Metabolic Panel:  Recent Labs  Lab 03/22/22 2220 03/23/22 0240  NA 129* 130*  K 4.0 3.9  CL 98 100  CO2 23 22  GLUCOSE 171* 100*  BUN 11 10  CREATININE 1.03 0.99  CALCIUM 8.5* 8.5*  MG 2.0  --    Lipid Panel:  Recent Labs  Lab 03/22/22 2220  CHOL 113  TRIG 95  HDL 32*  CHOLHDL 3.5  VLDL 19  LDLCALC 62   HgbA1c:  Recent Labs  Lab 03/21/22 1929  HGBA1C 6.1*   Urine Drug Screen:  Recent Labs  Lab 03/23/22 0917  LABOPIA NONE DETECTED  COCAINSCRNUR NONE DETECTED  LABBENZ NONE DETECTED  AMPHETMU NONE DETECTED  THCU NONE DETECTED  LABBARB NONE DETECTED    Alcohol Level  Recent Labs  Lab 03/21/22 1929  ETH <10    IMAGING past 24 hours No results found.  PHYSICAL EXAM  Physical Exam  Constitutional: Appears well-developed and well-nourished.   Cardiovascular: Normal rate and regular rhythm.  Respiratory: Effort normal, non-labored breathing  Neuro: Mental Status: Patient is awake, alert, oriented to person, place, month, year, and situation. Patient is able to give a clear and coherent history. No signs of aphasia or  neglect Cranial Nerves: II: Visual Fields are full. Pupils are equal, round, and reactive to light.  III,IV, VI: EOMI without ptosis or diploplia.  V: Facial sensation is symmetric to temperature VII: Facial movement is symmetric resting and smiling VIII: Hearing is intact to voice XI: Shoulder shrug is symmetric. XII: Tongue protrudes midline without atrophy or fasciculations.  Motor: Tone is normal. Bulk is normal. 5/5 strength was present in all four extremities.  Sensory: Sensation is symmetric to light touch and temperature in the arms and legs.  Cerebellar: FNF and HKS are intact bilaterally  ASSESSMENT/PLAN Mr. Calvin Smith is a 62 y.o. male with history of diabetes, hypertension, hyperlipidemia, migraines, sleep apnea presenting with episode of ataxic gait.  Likely posterior TIA with vertebrobasilar insufficiency likely large vessel disease Code Stroke CT head No acute abnormality.  CTA head & neck bilateral ICA bulb high-grade stenosis, left V1 high-grade stenosis Cerebral angio left V1 85% stenosis, right V1 70% stenosis, bilateral ICA 80% stenosis. MRI no acute infarct Status post left VA stenting 8/18 LDL 62 HgbA1c 6.1 VTE prophylaxis - SCDs aspirin 81 mg daily prior to admission, now on aspirin 81 mg daily and clopidogrel 75  mg daily DAPT after intracranial stenting. Therapy recommendations: None Disposition:  Home  Carotid stenosis Bilateral carotid high grade stenosis on CTA Cerebral angiogram confirmed bilateral ICA 80% stenosis Follow-up with Dr. Norma Fredrickson as outpatient in 2 weeks  Hypertension Stable Long-term BP goal normotensive  Hyperlipidemia Home meds:  lipitor 80, resumed in hospital LDL 62, goal < 70 Continue statin at discharge  Diabetes Mellitus Type II, well controlled Home meds:  lantus 45U BID, jardiance 25, ozempic '2mg'$  weekly metformin 1000 BID, glipizide '5mg'$  BID HgbA1c 6.1, goal < 7.0 SSI CBG monitoring Close PCP follow-up as  patient  Other Stroke Risk Factors Obesity, Body mass index is 30.18 kg/m., BMI >/= 30 associated with increased stroke risk, recommend weight loss, diet and exercise as appropriate  Hx of migraines  Obstructive sleep apnea  Hospital day # 3  Neurology will sign off. Please call with questions. Pt will follow up with stroke clinic NP at [facility] in about 4 weeks. Thanks for the consult.  Rosalin Hawking, MD PhD Stroke Neurology 03/25/2022 6:11 PM   To contact Stroke Continuity provider, please refer to [email or web address] After hours, contact General Neurology

## 2022-03-25 NOTE — Progress Notes (Signed)
Referring Physician(s): Agbata,Tochukwu Dr Raelyn Mora  Supervising Physician: Pedro Earls  Patient Status:  [facility] - In-pt  Chief Complaint:  Left Vertebral artery stenosis  Subjective:  Procedure in IR 8/18: DIAGNOSTIC CEREBRAL ANGIOGRAM AND LEFT VERTEBRAL ARTERY ANGIOPLASTY AND STENTING  Pt doing so well today Dr Tennis Must Sindy Messing has seen and examined pt in room Denies pain; headache Denies N/V Denies numbness/tingling  Dr Erlinda Hong in room also for Neuro examination  Allergies: Bisoprolol-hydrochlorothiazide and Codeine  Medications: Prior to Admission medications   Medication Sig Start Date End Date Taking? Authorizing Provider  aspirin EC 81 MG tablet Take 81 mg by mouth daily.   Yes [provider]  atorvastatin (LIPITOR) 80 MG tablet Take 1 tablet (80 mg total) by mouth daily. 03/23/22 06/21/22 Yes Agbata, Tochukwu, MD  empagliflozin (JARDIANCE) 25 MG TABS tablet Take 25 mg by mouth daily.   Yes [provider]  glipiZIDE (GLUCOTROL XL) 5 MG 24 hr tablet Take 5 mg by mouth in the morning and at bedtime.   Yes [provider]  ibuprofen (ADVIL) 200 MG tablet Take 400-600 mg by mouth daily as needed for headache.   Yes [provider]  LANTUS SOLOSTAR 100 UNIT/ML Solostar Pen INJECT SUBCUTANEOUSLY 90  UNITS AT BEDTIME Patient taking differently: 45 Units 2 (two) times daily. 03/01/22  Yes Viviana Simpler I, MD  lisinopril (ZESTRIL) 20 MG tablet Take 20 mg by mouth daily.   Yes [provider]  metFORMIN (GLUCOPHAGE) 1000 MG tablet Take 1,000 mg by mouth 2 (two) times daily with a meal.   Yes [provider]  omeprazole (PRILOSEC) 20 MG capsule TAKE 1 CAPSULE BY MOUTH IN THE MORNING AND AT BEDTIME. Patient taking differently: Take 20 mg by mouth 2 (two) times daily before a meal. 10/03/21  Yes [name], MD  Semaglutide, 2 MG/DOSE, (OZEMPIC, 2 MG/DOSE,) 8 MG/3ML SOPN Inject 2 mg into the skin once  a week.   Yes [provider]  clopidogrel (PLAVIX) 75 MG tablet Take 1 tablet (75 mg total) by mouth daily. Patient not taking: Reported on 03/23/2022 03/23/22 04/22/22  Collier Bullock, MD     Vital Signs: BP (!) 148/87   Pulse (!) 104   Temp 100 F (37.8 C) (Axillary)   Resp 20   Ht _0  (1.676 m)   Wt 187 lb (84.8 kg)   SpO2 97%   BMI 30.18 kg/m   Physical Exam Vitals reviewed.  HENT:     Mouth/Throat:     Comments: Smile = Face symmetrical  Eyes:     Extraocular Movements: Extraocular movements intact.  Musculoskeletal:        General: No swelling or tenderness. Normal range of motion.     Comments: Moves all 4s Good strength and equal sensation Rt groin NT no bleeding No hematoma Rt foot good pulses  Skin:    General: Skin is warm.  Neurological:     Mental Status: He is alert and oriented to person, place, and time.  Psychiatric:        Behavior: Behavior normal.     Imaging: IR Danielle Dess Or Car A Stent  Result Date: 03/24/2022 INDICATION: 61 year old male with past medical history significant for diabetes, hypertension, hyperlipidemia, migraines, sleep apnea who presented to the ED for evaluation after an episode of ataxic gait, vertigo and nausea. Symptoms resolved after 20 minutes. CT of the head without contrast showed no acute intracranial abnormality. CT  angiogram of the head and neck showed multifocal severe stenosis in bilateral carotid bifurcation and origin of the vertebral arteries densely calcified plaques. He underwent a diagnostic cerebral angiogram on March 24, 2019 3 seconds confirmed CT angiogram findings. He returns today for left vertebral artery angioplasty and stenting for treatment of severe ostial stenosis. EXAM: ULTRASOUND-GUIDED VASCULAR ACCESS DIAGNOSTIC CEREBRAL ANGIOGRAM LEFT VERTEBRAL ARTERY ANGIOPLASTY AND STENTING COMPARISON:  Cerebral angiogram March 23, 2022. MEDICATIONS: 6000 units of heparin IV.  ANESTHESIA/SEDATION: The procedure was performed under general anesthesia. CONTRAST:  75 mL of Omnipaque 300 milligram/mL FLUOROSCOPY: Radiation Exposure Index (as provided by the fluoroscopic device): 443 mGy Kerma COMPLICATIONS: None immediate. TECHNIQUE: Informed written consent was obtained from the patient after a thorough discussion of the procedural risks, benefits and alternatives. All questions were addressed. Maximal Sterile Barrier Technique was utilized including caps, mask, sterile gowns, sterile gloves, sterile drape, hand hygiene and skin antiseptic. A timeout was performed prior to the initiation of the procedure. The right groin was prepped and draped in the usual sterile fashion. Using a micropuncture kit and the modified Seldinger technique, access was gained to the right common femoral artery and an 8 French sheath was placed. Real-time ultrasound guidance was utilized for vascular access including the acquisition of a permanent ultrasound image documenting patency of the accessed vessel. Under fluoroscopy, a Cerebase guide catheter was navigated over a 0.035" Terumo Glidewire into the aortic arch. The catheter was placed into the left subclavian artery. Frontal and lateral angiograms of the head were obtained followed by frontal and lateral angiograms of the neck. FINDINGS: 1. Normal caliber of the right common femoral artery, adequate for vascular access. 2. Severely calcified plaque in the left subclavian artery extending to the at the origin of the left vertebral artery, resulting in severe stenosis at the origin of the left vertebral artery (85%), without significant stenosis of the subclavian artery. Delayed flow is noted in the left vertebral artery distal to the stenosis. 3. The intracranial left vertebral artery, basilar artery, and bilateral posterior cerebral arteries are unremarkable. PROCEDURE: Frontal and lateral angiograms of the neck were obtained via left subclavian contrast  injection. Using biplane roadmap guidance, a 4.5 X 20 mm synergy balloon mounted stent was navigated over a 0.014 inch Aristotle guidewire into the proximal left vertebral artery. Frontal and lateral angiograms were obtained to evaluate stent positioning. The stent is at the proximal left vertebral artery with the proximal end at the level of the ostium. A 0.035" Terumo Glidewire was advanced into the distal left subclavian artery for support. The balloon was then inflated to its nominal pressure, expanding the stent within the proximal left vertebral artery. Left subclavian artery angiograms with frontal lateral views of the neck showed adequate stent positioning and expansion with complete resolution of stenosis. Angiograms with frontal and lateral views of the head showed no evidence of thromboembolic complication with improved opacification of the intracranial left vertebral artery, basilar artery and its main branches, and bilateral posterior cerebral arteries. Delayed angiograms showed no evidence of in stent clot formation. The catheter was subsequently withdrawn. Right common femoral artery angiogram was obtained in right anterior oblique view. The puncture is at the level of the common femoral artery. The artery has normal caliber, adequate for closure device. The sheath was exchanged over the wire for a Perclose ProGlide which was utilized for access closure. Immediate hemostasis was achieved. IMPRESSION: Successful and uncomplicated angioplasty and stenting of a severe left vertebral artery ostial stenosis. PLAN: 1.  Transferred to ICU for monitoring for 24 hours. 2. Continue Plavix 75 mg and aspirin 81 mg q.d. Electronically Signed   By: Pedro Earls M.D.   On: 03/24/2022 14:33   IR US Guide Vasc Access Right  Result Date: 03/24/2022 INDICATION: 61 year old male with past medical history significant for diabetes, hypertension, hyperlipidemia, migraines, sleep apnea who presented to  the ED for evaluation after an episode of ataxic gait, vertigo and nausea. Symptoms resolved after 20 minutes. CT of the head without contrast showed no acute intracranial abnormality. CT angiogram of the head and neck showed multifocal severe stenosis in bilateral carotid bifurcation and origin of the vertebral arteries densely calcified plaques. He underwent a diagnostic cerebral angiogram on March 24, 2019 3 seconds confirmed CT angiogram findings. He returns today for left vertebral artery angioplasty and stenting for treatment of severe ostial stenosis. EXAM: ULTRASOUND-GUIDED VASCULAR ACCESS DIAGNOSTIC CEREBRAL ANGIOGRAM LEFT VERTEBRAL ARTERY ANGIOPLASTY AND STENTING COMPARISON:  Cerebral angiogram March 23, 2022. MEDICATIONS: 6000 units of heparin IV. ANESTHESIA/SEDATION: The procedure was performed under general anesthesia. CONTRAST:  75 mL of Omnipaque 300 milligram/mL FLUOROSCOPY: Radiation Exposure Index (as provided by the fluoroscopic device): 983 mGy Kerma COMPLICATIONS: None immediate. TECHNIQUE: Informed written consent was obtained from the patient after a thorough discussion of the procedural risks, benefits and alternatives. All questions were addressed. Maximal Sterile Barrier Technique was utilized including caps, mask, sterile gowns, sterile gloves, sterile drape, hand hygiene and skin antiseptic. A timeout was performed prior to the initiation of the procedure. The right groin was prepped and draped in the usual sterile fashion. Using a micropuncture kit and the modified Seldinger technique, access was gained to the right common femoral artery and an 8 French sheath was placed. Real-time ultrasound guidance was utilized for vascular access including the acquisition of a permanent ultrasound image documenting patency of the accessed vessel. Under fluoroscopy, a Cerebase guide catheter was navigated over a 0.035" Terumo Glidewire into the aortic arch. The catheter was placed into the left  subclavian artery. Frontal and lateral angiograms of the head were obtained followed by frontal and lateral angiograms of the neck. FINDINGS: 1. Normal caliber of the right common femoral artery, adequate for vascular access. 2. Severely calcified plaque in the left subclavian artery extending to the at the origin of the left vertebral artery, resulting in severe stenosis at the origin of the left vertebral artery (85%), without significant stenosis of the subclavian artery. Delayed flow is noted in the left vertebral artery distal to the stenosis. 3. The intracranial left vertebral artery, basilar artery, and bilateral posterior cerebral arteries are unremarkable. PROCEDURE: Frontal and lateral angiograms of the neck were obtained via left subclavian contrast injection. Using biplane roadmap guidance, a 4.5 X 20 mm synergy balloon mounted stent was navigated over a 0.014 inch Aristotle guidewire into the proximal left vertebral artery. Frontal and lateral angiograms were obtained to evaluate stent positioning. The stent is at the proximal left vertebral artery with the proximal end at the level of the ostium. A 0.035" Terumo Glidewire was advanced into the distal left subclavian artery for support. The balloon was then inflated to its nominal pressure, expanding the stent within the proximal left vertebral artery. Left subclavian artery angiograms with frontal lateral views of the neck showed adequate stent positioning and expansion with complete resolution of stenosis. Angiograms with frontal and lateral views of the head showed no evidence of thromboembolic complication with improved opacification of the intracranial left vertebral artery, basilar artery  and its main branches, and bilateral posterior cerebral arteries. Delayed angiograms showed no evidence of in stent clot formation. The catheter was subsequently withdrawn. Right common femoral artery angiogram was obtained in right anterior oblique view. The  puncture is at the level of the common femoral artery. The artery has normal caliber, adequate for closure device. The sheath was exchanged over the wire for a Perclose ProGlide which was utilized for access closure. Immediate hemostasis was achieved. IMPRESSION: Successful and uncomplicated angioplasty and stenting of a severe left vertebral artery ostial stenosis. PLAN: 1. Transferred to ICU for monitoring for 24 hours. 2. Continue Plavix 75 mg and aspirin 81 mg q.d. Electronically Signed   By: Pedro Earls M.D.   On: 03/24/2022 14:33   IR US Guide Vasc Access Right  Result Date: 03/23/2022 INDICATION: 61 year old male with past medical history significant for diabetes, hypertension, hyperlipidemia, migraines, sleep apnea who presented to the ED for evaluation after an episode of ataxic gait, vertigo and nausea. Symptoms resolved after 20 minutes. CT of the head without contrast showed no acute intracranial abnormality. CT angiogram of the head and neck showed multifocal severe stenosis in bilateral carotid bifurcation and origin of the vertebral arteries. Due to densely calcified plaques, decision was in made to proceed with a diagnostic cerebral angiogram to better evaluate the degree of stenosis. EXAM: BILATERAL COMMON CAROTID AND INNOMINATE ANGIOGRAPHY AND BILATERAL VERTEBRAL ARTERY ANGIOGRAMS MEDICATIONS: No antibiotics administered. ANESTHESIA/SEDATION: Moderate (conscious) sedation was employed during this procedure. A total of Versed 2 mg and Fentanyl 50 mcg was administered intravenously by the radiology nurse. Total intra-service moderate Sedation Time: 43 minutes. The patient's level of consciousness and vital signs were monitored continuously by radiology nursing throughout the procedure under my direct supervision. FLUOROSCOPY: Radiation Exposure Index (as provided by the fluoroscopic device): 505 mGy Kerma COMPLICATIONS: None immediate. TECHNIQUE: Informed written consent was  obtained from the patient after a thorough discussion of the procedural risks, benefits and alternatives. All questions were addressed. Maximal Sterile Barrier Technique was utilized including caps, mask, sterile gowns, sterile gloves, sterile drape, hand hygiene and skin antiseptic. A timeout was performed prior to the initiation of the procedure. The right groin was prepped and draped in the usual sterile fashion. Using a micropuncture kit and the modified Seldinger technique, access was gained to the right common femoral artery and a 5 French sheath was placed. Real-time ultrasound guidance was utilized for vascular access including the acquisition of a permanent ultrasound image documenting patency of the accessed vessel. Under fluoroscopy, a 5 [place] Berenstein 2 catheter was navigated over 0.035" Terumo Glidewire into the aortic arch. The catheter was placed into the left subclavian artery. Frontal, lateral and multiple oblique angiograms of the neck were obtained. Then, frontal and lateral view of the head was obtained. The catheter was then placed into the right common carotid artery. Frontal, lateral and bilateral oblique angiograms of the neck were obtained. Then, frontal and lateral angiograms of the head were obtained. The catheter was subsequently placed into the right subclavian artery. Frontal and lateral angiograms of the neck were obtained followed by frontal and lateral angiograms of the head. Next, the catheter was placed into the left common carotid artery. Frontal, lateral and bilateral oblique angiograms of the neck were obtained followed by frontal and lateral angiograms of the head. The catheter was subsequently withdrawn. Right common femoral artery angiogram was obtained in right anterior oblique view. The puncture is at the level of the common femoral artery.  The artery has normal caliber, adequate for closure device. A 5 [place] Exoseal was utilized for access closure. Immediate hemostasis  was achieved. PROCEDURE: No intervention was performed. FINDINGS: Left subclavian angiograms: Severely calcified plaque in the left subclavian artery extending to the at the origin of the left vertebral artery, resulting in severe stenosis at the origin of the left vertebral artery (85%), without significant stenosis of the subclavian artery. Delayed flow is noted in the left vertebral artery distal to the stenosis. Left vertebral artery angiograms: The intracranial left vertebral artery, basilar artery, and bilateral posterior cerebral arteries are unremarkable. Luminal caliber is smooth and tapering. No aneurysms or abnormally high-flow, early draining veins are seen. No regions of abnormal hypervascularity are noted. The visualized dural sinuses are patent. Right CCA angiograms: Cervical angiograms show prominently calcified plaques along the right carotid bulb resulting in approximately 80% stenosis. Right ICA angiograms: There is brisk vascular contrast filling of the right ACA and MCA vascular trees. There is also brisk contrast opacification of the right PCA vascular tree via a prominent posterior communicating artery. Luminal caliber is smooth and tapering. No aneurysms or abnormally high-flow, early draining veins are seen. No regions of abnormal hypervascularity are noted. The visualized dural sinuses are patent. The visualized branches of the right external carotid artery are unremarkable. Right subclavian angiograms: Atherosclerotic changes at the origin of the right vertebral artery resulting in approximately 70% stenosis. The right subclavian artery have normal caliber. Right vertebral artery angiograms: Mild atherosclerotic changes at the V4 segment of the right vertebral artery without hemodynamically significant stenosis. The basilar artery, and bilateral posterior cerebral arteries are unremarkable. Luminal caliber is smooth and tapering. No aneurysms or abnormally high-flow, early draining veins  are seen. No regions of abnormal hypervascularity are noted. The visualized dural sinuses are patent. Left CCA angiograms: Prominently calcified plaques in the left carotid bifurcation resulting in approximately 80% stenosis. Left ICA angiograms: There is brisk vascular contrast filling of the left ACA and MCA vascular trees. Luminal caliber is smooth and tapering. No aneurysms or abnormally high-flow, early draining veins are seen. No regions of abnormal hypervascularity are noted. The visualized dural sinuses are patent. The visualized branches of the left external carotid artery are unremarkable. Right common femoral artery angiograms: The access is at the level of the mid right common femoral artery. The femoral artery has normal caliber, adequate for closure device. IMPRESSION: 1. Severe atherosclerotic disease at the origin of the left vertebral artery resulting in approximately 85% stenosis. 2. Severe atherosclerotic disease at the right carotid bifurcation resulting approximately 80% stenosis. 3. Severe atherosclerotic disease at the left carotid bifurcation resulting in approximately 80% stenosis. 4. Atherosclerotic disease at the origin of the right vertebral artery resulting in approximately 70% stenosis. Electronically Signed   By: Pedro Earls M.D.   On: 03/23/2022 14:20   IR ANGIO VERTEBRAL SEL SUBCLAVIAN INNOMINATE BILAT MOD SED  Result Date: 03/23/2022 INDICATION: 61 year old male with past medical history significant for diabetes, hypertension, hyperlipidemia, migraines, sleep apnea who presented to the ED for evaluation after an episode of ataxic gait, vertigo and nausea. Symptoms resolved after 20 minutes. CT of the head without contrast showed no acute intracranial abnormality. CT angiogram of the head and neck showed multifocal severe stenosis in bilateral carotid bifurcation and origin of the vertebral arteries. Due to densely calcified plaques, decision was in made to  proceed with a diagnostic cerebral angiogram to better evaluate the degree of stenosis. EXAM: BILATERAL COMMON CAROTID AND  INNOMINATE ANGIOGRAPHY AND BILATERAL VERTEBRAL ARTERY ANGIOGRAMS MEDICATIONS: No antibiotics administered. ANESTHESIA/SEDATION: Moderate (conscious) sedation was employed during this procedure. A total of Versed 2 mg and Fentanyl 50 mcg was administered intravenously by the radiology nurse. Total intra-service moderate Sedation Time: 43 minutes. The patient's level of consciousness and vital signs were monitored continuously by radiology nursing throughout the procedure under my direct supervision. FLUOROSCOPY: Radiation Exposure Index (as provided by the fluoroscopic device): 557 mGy Kerma COMPLICATIONS: None immediate. TECHNIQUE: Informed written consent was obtained from the patient after a thorough discussion of the procedural risks, benefits and alternatives. All questions were addressed. Maximal Sterile Barrier Technique was utilized including caps, mask, sterile gowns, sterile gloves, sterile drape, hand hygiene and skin antiseptic. A timeout was performed prior to the initiation of the procedure. The right groin was prepped and draped in the usual sterile fashion. Using a micropuncture kit and the modified Seldinger technique, access was gained to the right common femoral artery and a 5 French sheath was placed. Real-time ultrasound guidance was utilized for vascular access including the acquisition of a permanent ultrasound image documenting patency of the accessed vessel. Under fluoroscopy, a 5 [place] Berenstein 2 catheter was navigated over 0.035" Terumo Glidewire into the aortic arch. The catheter was placed into the left subclavian artery. Frontal, lateral and multiple oblique angiograms of the neck were obtained. Then, frontal and lateral view of the head was obtained. The catheter was then placed into the right common carotid artery. Frontal, lateral and bilateral oblique  angiograms of the neck were obtained. Then, frontal and lateral angiograms of the head were obtained. The catheter was subsequently placed into the right subclavian artery. Frontal and lateral angiograms of the neck were obtained followed by frontal and lateral angiograms of the head. Next, the catheter was placed into the left common carotid artery. Frontal, lateral and bilateral oblique angiograms of the neck were obtained followed by frontal and lateral angiograms of the head. The catheter was subsequently withdrawn. Right common femoral artery angiogram was obtained in right anterior oblique view. The puncture is at the level of the common femoral artery. The artery has normal caliber, adequate for closure device. A 5 [place] Exoseal was utilized for access closure. Immediate hemostasis was achieved. PROCEDURE: No intervention was performed. FINDINGS: Left subclavian angiograms: Severely calcified plaque in the left subclavian artery extending to the at the origin of the left vertebral artery, resulting in severe stenosis at the origin of the left vertebral artery (85%), without significant stenosis of the subclavian artery. Delayed flow is noted in the left vertebral artery distal to the stenosis. Left vertebral artery angiograms: The intracranial left vertebral artery, basilar artery, and bilateral posterior cerebral arteries are unremarkable. Luminal caliber is smooth and tapering. No aneurysms or abnormally high-flow, early draining veins are seen. No regions of abnormal hypervascularity are noted. The visualized dural sinuses are patent. Right CCA angiograms: Cervical angiograms show prominently calcified plaques along the right carotid bulb resulting in approximately 80% stenosis. Right ICA angiograms: There is brisk vascular contrast filling of the right ACA and MCA vascular trees. There is also brisk contrast opacification of the right PCA vascular tree via a prominent posterior communicating artery.  Luminal caliber is smooth and tapering. No aneurysms or abnormally high-flow, early draining veins are seen. No regions of abnormal hypervascularity are noted. The visualized dural sinuses are patent. The visualized branches of the right external carotid artery are unremarkable. Right subclavian angiograms: Atherosclerotic changes at the origin of the right vertebral  artery resulting in approximately 70% stenosis. The right subclavian artery have normal caliber. Right vertebral artery angiograms: Mild atherosclerotic changes at the V4 segment of the right vertebral artery without hemodynamically significant stenosis. The basilar artery, and bilateral posterior cerebral arteries are unremarkable. Luminal caliber is smooth and tapering. No aneurysms or abnormally high-flow, early draining veins are seen. No regions of abnormal hypervascularity are noted. The visualized dural sinuses are patent. Left CCA angiograms: Prominently calcified plaques in the left carotid bifurcation resulting in approximately 80% stenosis. Left ICA angiograms: There is brisk vascular contrast filling of the left ACA and MCA vascular trees. Luminal caliber is smooth and tapering. No aneurysms or abnormally high-flow, early draining veins are seen. No regions of abnormal hypervascularity are noted. The visualized dural sinuses are patent. The visualized branches of the left external carotid artery are unremarkable. Right common femoral artery angiograms: The access is at the level of the mid right common femoral artery. The femoral artery has normal caliber, adequate for closure device. IMPRESSION: 1. Severe atherosclerotic disease at the origin of the left vertebral artery resulting in approximately 85% stenosis. 2. Severe atherosclerotic disease at the right carotid bifurcation resulting approximately 80% stenosis. 3. Severe atherosclerotic disease at the left carotid bifurcation resulting in approximately 80% stenosis. 4. Atherosclerotic  disease at the origin of the right vertebral artery resulting in approximately 70% stenosis. Electronically Signed   By: Pedro Earls M.D.   On: 03/23/2022 14:20   IR ANGIO INTRA EXTRACRAN SEL COM CAROTID INNOMINATE BILAT MOD SED  Result Date: 03/23/2022 INDICATION: 61 year old male with past medical history significant for diabetes, hypertension, hyperlipidemia, migraines, sleep apnea who presented to the ED for evaluation after an episode of ataxic gait, vertigo and nausea. Symptoms resolved after 20 minutes. CT of the head without contrast showed no acute intracranial abnormality. CT angiogram of the head and neck showed multifocal severe stenosis in bilateral carotid bifurcation and origin of the vertebral arteries. Due to densely calcified plaques, decision was in made to proceed with a diagnostic cerebral angiogram to better evaluate the degree of stenosis. EXAM: BILATERAL COMMON CAROTID AND INNOMINATE ANGIOGRAPHY AND BILATERAL VERTEBRAL ARTERY ANGIOGRAMS MEDICATIONS: No antibiotics administered. ANESTHESIA/SEDATION: Moderate (conscious) sedation was employed during this procedure. A total of Versed 2 mg and Fentanyl 50 mcg was administered intravenously by the radiology nurse. Total intra-service moderate Sedation Time: 43 minutes. The patient's level of consciousness and vital signs were monitored continuously by radiology nursing throughout the procedure under my direct supervision. FLUOROSCOPY: Radiation Exposure Index (as provided by the fluoroscopic device): 270 mGy Kerma COMPLICATIONS: None immediate. TECHNIQUE: Informed written consent was obtained from the patient after a thorough discussion of the procedural risks, benefits and alternatives. All questions were addressed. Maximal Sterile Barrier Technique was utilized including caps, mask, sterile gowns, sterile gloves, sterile drape, hand hygiene and skin antiseptic. A timeout was performed prior to the initiation of the  procedure. The right groin was prepped and draped in the usual sterile fashion. Using a micropuncture kit and the modified Seldinger technique, access was gained to the right common femoral artery and a 5 French sheath was placed. Real-time ultrasound guidance was utilized for vascular access including the acquisition of a permanent ultrasound image documenting patency of the accessed vessel. Under fluoroscopy, a 5 [place] Berenstein 2 catheter was navigated over 0.035" Terumo Glidewire into the aortic arch. The catheter was placed into the left subclavian artery. Frontal, lateral and multiple oblique angiograms of the neck were obtained. Then, frontal  and lateral view of the head was obtained. The catheter was then placed into the right common carotid artery. Frontal, lateral and bilateral oblique angiograms of the neck were obtained. Then, frontal and lateral angiograms of the head were obtained. The catheter was subsequently placed into the right subclavian artery. Frontal and lateral angiograms of the neck were obtained followed by frontal and lateral angiograms of the head. Next, the catheter was placed into the left common carotid artery. Frontal, lateral and bilateral oblique angiograms of the neck were obtained followed by frontal and lateral angiograms of the head. The catheter was subsequently withdrawn. Right common femoral artery angiogram was obtained in right anterior oblique view. The puncture is at the level of the common femoral artery. The artery has normal caliber, adequate for closure device. A 5 [place] Exoseal was utilized for access closure. Immediate hemostasis was achieved. PROCEDURE: No intervention was performed. FINDINGS: Left subclavian angiograms: Severely calcified plaque in the left subclavian artery extending to the at the origin of the left vertebral artery, resulting in severe stenosis at the origin of the left vertebral artery (85%), without significant stenosis of the subclavian  artery. Delayed flow is noted in the left vertebral artery distal to the stenosis. Left vertebral artery angiograms: The intracranial left vertebral artery, basilar artery, and bilateral posterior cerebral arteries are unremarkable. Luminal caliber is smooth and tapering. No aneurysms or abnormally high-flow, early draining veins are seen. No regions of abnormal hypervascularity are noted. The visualized dural sinuses are patent. Right CCA angiograms: Cervical angiograms show prominently calcified plaques along the right carotid bulb resulting in approximately 80% stenosis. Right ICA angiograms: There is brisk vascular contrast filling of the right ACA and MCA vascular trees. There is also brisk contrast opacification of the right PCA vascular tree via a prominent posterior communicating artery. Luminal caliber is smooth and tapering. No aneurysms or abnormally high-flow, early draining veins are seen. No regions of abnormal hypervascularity are noted. The visualized dural sinuses are patent. The visualized branches of the right external carotid artery are unremarkable. Right subclavian angiograms: Atherosclerotic changes at the origin of the right vertebral artery resulting in approximately 70% stenosis. The right subclavian artery have normal caliber. Right vertebral artery angiograms: Mild atherosclerotic changes at the V4 segment of the right vertebral artery without hemodynamically significant stenosis. The basilar artery, and bilateral posterior cerebral arteries are unremarkable. Luminal caliber is smooth and tapering. No aneurysms or abnormally high-flow, early draining veins are seen. No regions of abnormal hypervascularity are noted. The visualized dural sinuses are patent. Left CCA angiograms: Prominently calcified plaques in the left carotid bifurcation resulting in approximately 80% stenosis. Left ICA angiograms: There is brisk vascular contrast filling of the left ACA and MCA vascular trees. Luminal  caliber is smooth and tapering. No aneurysms or abnormally high-flow, early draining veins are seen. No regions of abnormal hypervascularity are noted. The visualized dural sinuses are patent. The visualized branches of the left external carotid artery are unremarkable. Right common femoral artery angiograms: The access is at the level of the mid right common femoral artery. The femoral artery has normal caliber, adequate for closure device. IMPRESSION: 1. Severe atherosclerotic disease at the origin of the left vertebral artery resulting in approximately 85% stenosis. 2. Severe atherosclerotic disease at the right carotid bifurcation resulting approximately 80% stenosis. 3. Severe atherosclerotic disease at the left carotid bifurcation resulting in approximately 80% stenosis. 4. Atherosclerotic disease at the origin of the right vertebral artery resulting in approximately 70% stenosis. Electronically  Signed   By: Pedro Earls M.D.   On: 03/23/2022 14:20   DG CHEST PORT 1 VIEW  Result Date: 03/22/2022 CLINICAL DATA:  Recent TIA EXAM: PORTABLE CHEST 1 VIEW COMPARISON:  10/17/2006 FINDINGS: The heart size and mediastinal contours are within normal limits. Both lungs are clear. The visualized skeletal structures are unremarkable. IMPRESSION: No active disease. Electronically Signed   By: Inez Catalina M.D.   On: 03/22/2022 22:38   MR BRAIN WO CONTRAST  Result Date: 03/22/2022 CLINICAL DATA:  61 year old male TIA. Dizziness. Unsteady gait. Neurologic deficit. EXAM: MRI HEAD WITHOUT CONTRAST TECHNIQUE: Multiplanar, multiecho pulse sequences of the brain and surrounding structures were obtained without intravenous contrast. COMPARISON:  CTA head and neck earlier today. FINDINGS: Brain: No restricted diffusion to suggest acute infarction. No midline shift, mass effect, evidence of mass lesion, ventriculomegaly, extra-axial collection or acute intracranial hemorrhage. Cervicomedullary junction and  pituitary are within normal limits. Scattered small subcortical white matter T2 and FLAIR hyperintense foci, mild to moderate for age. Occasional periventricular involvement. No cortical encephalomalacia or chronic cerebral blood products identified. Deep gray nuclei, brainstem, and cerebellum are normal for age. Vascular: Major intracranial vascular flow voids are preserved. Skull and upper cervical spine: Negative. Visualized bone marrow signal is within normal limits. Sinuses/Orbits: Negative. Other: Mastoids are clear. Grossly negative visible internal auditory structures. Normal stylomastoid foramina. Negative visible scalp and face. IMPRESSION: 1. No acute intracranial abnormality. 2. Mild to moderate for age nonspecific cerebral white matter signal changes, most commonly due to chronic small vessel disease. Electronically Signed   By: Genevie Ann M.D.   On: 03/22/2022 09:19   CT ANGIO HEAD NECK W WO CM  Result Date: 03/22/2022 CLINICAL DATA:  62 year old male TIA.  Dizziness. EXAM: CT ANGIOGRAPHY HEAD AND NECK TECHNIQUE: Multidetector CT imaging of the head and neck was performed using the standard protocol during bolus administration of intravenous contrast. Multiplanar CT image reconstructions and MIPs were obtained to evaluate the vascular anatomy. Carotid stenosis measurements (when applicable) are obtained utilizing NASCET criteria, using the distal internal carotid diameter as the denominator. RADIATION DOSE REDUCTION: This exam was performed according to the departmental dose-optimization program which includes automated exposure control, adjustment of the mA and/or kV according to patient size and/or use of iterative reconstruction technique. CONTRAST:  27m OMNIPAQUE IOHEXOL 350 MG/ML SOLN COMPARISON:  Plain head CT 03/21/2022. FINDINGS: CTA NECK Skeleton: No acute osseous abnormality identified. Tympanic cavities and Visualized paranasal sinuses and mastoids are clear. Upper chest: Negative. Other  neck: Motion artifact at the soft palate, oropharynx. No acute neck soft tissue finding identified. Aortic arch: Mild arch atherosclerosis. Three vessel arch configuration. Right carotid system: Minimal brachiocephalic artery origin plaque without stenosis. Normal right CCA in total lateral calcified plaque which is just proximal to the bifurcation not resulting in stenosis (series 4, image 115). Patent carotid bifurcation. Bulky calcified plaque at the right ICA origin and bulb. Radiographic string sign stenosis results as seen on series 6 images 207 through 201, along a segment of up to 16 mm. Right ICA remains patent and is otherwise negative to the skull base. Left carotid system: Mild left CCA origin plaque without stenosis. Bulky calcified plaque beginning at the left carotid bifurcation and continuing into the left ICA origin and bulb with high-grade stenosis just before the left ICA origin on series 6, image 217 approaching a radiographic string sign. Origin and bulb calcified plaque then results in less than 50 % stenosis with respect to  the distal vessel. Vertebral arteries: Calcified plaque at the right subclavian artery origin without stenosis. Calcified plaque near the right vertebral artery origin,. Additional right V1 (series 6, images 288 and 273) with at least moderate V1 segment stenosis. But the right vertebral artery remains patent to the skull base with no additional plaque. Proximal left subclavian artery calcified plaque with no significant stenosis but bulky and long segment nearly 2.5 cm calcified plaque throughout the proximal left vertebral artery (series 7, image 128) with high-grade stenosis. However, the left vertebral is mildly dominant and remains patent. No additional plaque or stenosis to the skull base. CTA HEAD Posterior circulation: Mildly dominant left vertebral V4 segment with normal left PICA origin and vertebrobasilar junction. No distal left vertebral plaque or stenosis.  Contralateral right vertebral V4 segment moderate calcified plaque and stenosis on series 6, image 147. But the right vertebral remains patent to the vertebrobasilar junction. The right AICA appears dominant. Patent basilar artery with mild irregularity. No significant basilar stenosis. Patent SCA and PCA origins. Right posterior communicating artery is present. Bilateral PCA branches are patent with mild irregularity. Anterior circulation: Both ICA siphons are patent. Only mild supraclinoid ICA calcified plaque. No significant ICA siphon stenosis. Patent carotid termini. Patent MCA and ACA origins. Normal anterior communicating artery. Bilateral ACA branches are within normal limits. Left MCA M1 segment and bifurcation are patent without stenosis. Left MCA branches appear mildly irregular. Right MCA M1 segment is tortuous and bifurcates early without stenosis. Right MCA branches appear patent with mild irregularity. Venous sinuses: Early contrast timing, grossly patent. Anatomic variants: Mildly dominant left vertebral artery. Review of the MIP images confirms the above findings IMPRESSION: 1. Negative for large vessel occlusion, but positive for bulky calcified plaque at both carotid bifurcations and the proximal left vertebral artery: - Right ICA origin and bulb RADIOGRAPHIC STRING SIGN. - proximal Left Vertebral Artery RADIOGRAPHIC STRING SIGN. - Left Carotid Bifurcation High-grade Stenosis, approaching a radiographic string sign. 2. Also moderate Right Vertebral Artery V1 and V4 segment stenoses due to plaque. 3. Aortic Atherosclerosis (ICD10-I70.0). Electronically Signed   By: Genevie Ann M.D.   On: 03/22/2022 05:12   CT HEAD WO CONTRAST  Result Date: 03/21/2022 CLINICAL DATA:  Dizziness. EXAM: CT HEAD WITHOUT CONTRAST TECHNIQUE: Contiguous axial images were obtained from the base of the skull through the vertex without intravenous contrast. RADIATION DOSE REDUCTION: This exam was performed according to the  departmental dose-optimization program which includes automated exposure control, adjustment of the mA and/or kV according to patient size and/or use of iterative reconstruction technique. COMPARISON:  None Available. FINDINGS: Brain: No evidence of acute infarction, hemorrhage, hydrocephalus, extra-axial collection or mass lesion/mass effect. Vascular: No hyperdense vessel or unexpected calcification. Skull: Normal. Negative for fracture or focal lesion. Sinuses/Orbits: There is mild right ethmoid sinus mucosal thickening. Other: None. IMPRESSION: No acute intracranial abnormality. Electronically Signed   By: Virgina Norfolk M.D.   On: 03/21/2022 19:58    Labs:  CBC: Recent Labs    11/01/21 1143 03/21/22 1929 03/22/22 2220 03/23/22 0240  WBC 7.5 8.2 7.0 8.1  HGB 15.5 14.5 14.7 14.2  HCT 44.1 41.1 40.1 38.9*  PLT 236 204 209 194    COAGS: Recent Labs    03/21/22 1929  INR 1.1  APTT 28    BMP: Recent Labs    11/01/21 1143 03/21/22 1929 03/22/22 2220 03/23/22 0240  NA 138 131* 129* 130*  K 4.3 4.2 4.0 3.9  CL 97 98 98 100  CO2 _0 GLUCOSE 54* 153* 171* 100*  BUN _1 CALCIUM 9.2 8.8* 8.5* 8.5*  CREATININE 1.08 1.02 1.03 0.99  GFRNONAA  --  >60 >60 >60    LIVER FUNCTION TESTS: Recent Labs    11/01/21 1143 03/21/22 1929 03/22/22 2220 03/23/22 0240  BILITOT 0.7 1.8* 1.8* 1.5*  AST 40 36 33 30  ALT 48* 41 45* 40  ALKPHOS 77 69 60 57  PROT 6.7 7.4 6.6 6.3*  ALBUMIN 4.6 4.3 3.6 3.4*    Assessment and Plan:  Left vertebral artery angioplasty/stent in IR 03/24/22 Doing well this am OK for DC per NIR Continue ASA/Plavix daily Will let pt know of P2y12 results-- will call with any changes if necessary per Dr Debbrah Alar 2 week follow up--- pt will hear from IR scheduler for time and date He and wife have good understanding of plan  Electronically Signed: Lavonia Drafts, PA-C 03/25/2022, 11:48 AM   I spent a total of 15 Minutes at the the  patient's bedside AND on the patient's hospital floor or unit, greater than 50% of which was counseling/coordinating care for L VA angioplasty/stent

## 2022-03-25 NOTE — Discharge Summary (Signed)
Physician Discharge Summary  Calvin Smith:[id number] DOB: 10-22-1960 DOA: 03/22/2022  PCP: [name], MD  Admit date: 03/22/2022 Discharge date: 03/25/2022  Admitted From: Home via [facility] Disposition: Home  Recommendations for Outpatient Follow-up:  Follow up with PCP in 1-2 weeks Outpatient neurology follow-up. Interventional radiology will continue to follow.  Home Health: N/A Equipment/Devices: N/A  Discharge Condition: Stable CODE STATUS: Full code Diet recommendation: Low-salt and low-carb diet  Discharge summary: Patient with type 2 diabetes, hypertension, hyperlipidemia and migraine headaches and sleep apnea presented to [facility] ER with episode of staggering and unsteady gait sudden onset.  Stroke work-up was done.  Negative MRI.  He was found to have bulky calcific plaque at both carotid bifurcations and proximal left vertebral artery, radiographically string sign right ICA and left vertebral artery.  Transferred to [facility] for neuroradiology intervention. 8/17, four-vessel angiogram found to have severe stenosis in the left vertebral, right carotid bifurcation, left carotid bifurcation and right vertebral artery. 8/18, underwent angiogram under anesthesia and monitored overnight.  TIA/acute vertebral insufficiency: Clinical findings, difficulty gait.  Clinically improved.  Neurologically normal now. CT head findings, normal. MRI of the brain, no findings. CT angiogram of the head and neck with significant stenosis. Antiplatelet therapy, on aspirin and Plavix.  We will continue indefinitely. LDL 62, at goal.  Patient on atorvastatin 80 mg daily. Hemoglobin A1c, 6.1.  On insulin, Ozempic, metformin and glipizide.  Continue. CT angiogram and stenting by IR.  There will continue to follow for stenting of other arteries.   Type 2 diabetes, well controlled with hyperglycemia: Hemoglobin A1c 6.1.  Fairly controlled.  Continue home doses of  insulin, Ozempic and oral hypoglycemics.   Essential hypertension: Blood pressure stable.  He will be able to resume medications after discharge.  Stable for discharge.  Discharge Diagnoses:  Principal Problem:   History of TIA (transient ischemic attack) Active Problems:   TIA (transient ischemic attack)   Type 2 diabetes mellitus with circulatory disorder (HCC)   Essential hypertension, benign   GERD (gastroesophageal reflux disease)   Hyperlipemia   Vertebrobasilar insufficiency   Cerebral infarction due to stenosis of right vertebral artery [facility])    Discharge Instructions  Discharge Instructions     Diet - low sodium heart healthy   Complete by: As directed    Diet Carb Modified   Complete by: As directed    Increase activity slowly   Complete by: As directed    No wound care   Complete by: As directed       Allergies as of 03/25/2022       Reactions   Bisoprolol-hydrochlorothiazide Other (See Comments)    E.D. Unknown per Pt   Codeine Itching        Medication List     TAKE these medications    aspirin EC 81 MG tablet Take 81 mg by mouth daily.   atorvastatin 80 MG tablet Commonly known as: LIPITOR Take 1 tablet (80 mg total) by mouth daily.   clopidogrel 75 MG tablet Commonly known as: PLAVIX Take 1 tablet (75 mg total) by mouth daily.   empagliflozin 25 MG Tabs tablet Commonly known as: JARDIANCE Take 25 mg by mouth daily.   glipiZIDE 5 MG 24 hr tablet Commonly known as: GLUCOTROL XL Take 5 mg by mouth in the morning and at bedtime.   ibuprofen 200 MG tablet Commonly known as: ADVIL Take 400-600 mg by mouth daily as needed for headache.   Lantus  SoloStar 100 UNIT/ML Solostar Pen Generic drug: insulin glargine INJECT SUBCUTANEOUSLY 90  UNITS AT BEDTIME What changed: See the new instructions.   lisinopril 20 MG tablet Commonly known as: ZESTRIL Take 20 mg by mouth daily.   metFORMIN 1000 MG tablet Commonly known as:  GLUCOPHAGE Take 1,000 mg by mouth 2 (two) times daily with a meal.   omeprazole 20 MG capsule Commonly known as: PRILOSEC TAKE 1 CAPSULE BY MOUTH IN THE MORNING AND AT BEDTIME. What changed: See the new instructions.   Ozempic (2 MG/DOSE) 8 MG/3ML Sopn Generic drug: Semaglutide (2 MG/DOSE) Inject 2 mg into the skin once a week.        Follow-up Information     de Rosario Jacks, MD Follow up.   Specialties: Radiology, Interventional Radiology Why: follow up with Dr Debbrah Alar 2 weeks--- pt will hear from scheduler for appt time and date; call [phone] if any questions or concerns Contact information: [place] [address] [phone]                Allergies  Allergen Reactions   Bisoprolol-Hydrochlorothiazide Other (See Comments)     E.D. Unknown per Pt   Codeine Itching    Consultations: Neurology Interventional radiology   Procedures/Studies: IR Danielle Dess Or Car A Stent  Result Date: 03/24/2022 INDICATION: 61 year old male with past medical history significant for diabetes, hypertension, hyperlipidemia, migraines, sleep apnea who presented to the ED for evaluation after an episode of ataxic gait, vertigo and nausea. Symptoms resolved after 20 minutes. CT of the head without contrast showed no acute intracranial abnormality. CT angiogram of the head and neck showed multifocal severe stenosis in bilateral carotid bifurcation and origin of the vertebral arteries densely calcified plaques. He underwent a diagnostic cerebral angiogram on March 24, 2019 3 seconds confirmed CT angiogram findings. He returns today for left vertebral artery angioplasty and stenting for treatment of severe ostial stenosis. EXAM: ULTRASOUND-GUIDED VASCULAR ACCESS DIAGNOSTIC CEREBRAL ANGIOGRAM LEFT VERTEBRAL ARTERY ANGIOPLASTY AND STENTING COMPARISON:  Cerebral angiogram March 23, 2022. MEDICATIONS: 6000 units of heparin IV.  ANESTHESIA/SEDATION: The procedure was performed under general anesthesia. CONTRAST:  75 mL of Omnipaque 300 milligram/mL FLUOROSCOPY: Radiation Exposure Index (as provided by the fluoroscopic device): 254 mGy Kerma COMPLICATIONS: None immediate. TECHNIQUE: Informed written consent was obtained from the patient after a thorough discussion of the procedural risks, benefits and alternatives. All questions were addressed. Maximal Sterile Barrier Technique was utilized including caps, mask, sterile gowns, sterile gloves, sterile drape, hand hygiene and skin antiseptic. A timeout was performed prior to the initiation of the procedure. The right groin was prepped and draped in the usual sterile fashion. Using a micropuncture kit and the modified Seldinger technique, access was gained to the right common femoral artery and an 8 French sheath was placed. Real-time ultrasound guidance was utilized for vascular access including the acquisition of a permanent ultrasound image documenting patency of the accessed vessel. Under fluoroscopy, a Cerebase guide catheter was navigated over a 0.035" Terumo Glidewire into the aortic arch. The catheter was placed into the left subclavian artery. Frontal and lateral angiograms of the head were obtained followed by frontal and lateral angiograms of the neck. FINDINGS: 1. Normal caliber of the right common femoral artery, adequate for vascular access. 2. Severely calcified plaque in the left subclavian artery extending to the at the origin of the left vertebral artery, resulting in severe stenosis at the origin of the left vertebral artery (85%), without significant stenosis  of the subclavian artery. Delayed flow is noted in the left vertebral artery distal to the stenosis. 3. The intracranial left vertebral artery, basilar artery, and bilateral posterior cerebral arteries are unremarkable. PROCEDURE: Frontal and lateral angiograms of the neck were obtained via left subclavian contrast  injection. Using biplane roadmap guidance, a 4.5 X 20 mm synergy balloon mounted stent was navigated over a 0.014 inch Aristotle guidewire into the proximal left vertebral artery. Frontal and lateral angiograms were obtained to evaluate stent positioning. The stent is at the proximal left vertebral artery with the proximal end at the level of the ostium. A 0.035" Terumo Glidewire was advanced into the distal left subclavian artery for support. The balloon was then inflated to its nominal pressure, expanding the stent within the proximal left vertebral artery. Left subclavian artery angiograms with frontal lateral views of the neck showed adequate stent positioning and expansion with complete resolution of stenosis. Angiograms with frontal and lateral views of the head showed no evidence of thromboembolic complication with improved opacification of the intracranial left vertebral artery, basilar artery and its main branches, and bilateral posterior cerebral arteries. Delayed angiograms showed no evidence of in stent clot formation. The catheter was subsequently withdrawn. Right common femoral artery angiogram was obtained in right anterior oblique view. The puncture is at the level of the common femoral artery. The artery has normal caliber, adequate for closure device. The sheath was exchanged over the wire for a Perclose ProGlide which was utilized for access closure. Immediate hemostasis was achieved. IMPRESSION: Successful and uncomplicated angioplasty and stenting of a severe left vertebral artery ostial stenosis. PLAN: 1. Transferred to ICU for monitoring for 24 hours. 2. Continue Plavix 75 mg and aspirin 81 mg q.d. Electronically Signed   By: Pedro Earls M.D.   On: 03/24/2022 14:33   IR US Guide Vasc Access Right  Result Date: 03/24/2022 INDICATION: 62 year old male with past medical history significant for diabetes, hypertension, hyperlipidemia, migraines, sleep apnea who presented to  the ED for evaluation after an episode of ataxic gait, vertigo and nausea. Symptoms resolved after 20 minutes. CT of the head without contrast showed no acute intracranial abnormality. CT angiogram of the head and neck showed multifocal severe stenosis in bilateral carotid bifurcation and origin of the vertebral arteries densely calcified plaques. He underwent a diagnostic cerebral angiogram on March 24, 2019 3 seconds confirmed CT angiogram findings. He returns today for left vertebral artery angioplasty and stenting for treatment of severe ostial stenosis. EXAM: ULTRASOUND-GUIDED VASCULAR ACCESS DIAGNOSTIC CEREBRAL ANGIOGRAM LEFT VERTEBRAL ARTERY ANGIOPLASTY AND STENTING COMPARISON:  Cerebral angiogram March 23, 2022. MEDICATIONS: 6000 units of heparin IV. ANESTHESIA/SEDATION: The procedure was performed under general anesthesia. CONTRAST:  75 mL of Omnipaque 300 milligram/mL FLUOROSCOPY: Radiation Exposure Index (as provided by the fluoroscopic device): 604 mGy Kerma COMPLICATIONS: None immediate. TECHNIQUE: Informed written consent was obtained from the patient after a thorough discussion of the procedural risks, benefits and alternatives. All questions were addressed. Maximal Sterile Barrier Technique was utilized including caps, mask, sterile gowns, sterile gloves, sterile drape, hand hygiene and skin antiseptic. A timeout was performed prior to the initiation of the procedure. The right groin was prepped and draped in the usual sterile fashion. Using a micropuncture kit and the modified Seldinger technique, access was gained to the right common femoral artery and an 8 French sheath was placed. Real-time ultrasound guidance was utilized for vascular access including the acquisition of a permanent ultrasound image documenting patency of the accessed vessel.  Under fluoroscopy, a Cerebase guide catheter was navigated over a 0.035" Terumo Glidewire into the aortic arch. The catheter was placed into the left  subclavian artery. Frontal and lateral angiograms of the head were obtained followed by frontal and lateral angiograms of the neck. FINDINGS: 1. Normal caliber of the right common femoral artery, adequate for vascular access. 2. Severely calcified plaque in the left subclavian artery extending to the at the origin of the left vertebral artery, resulting in severe stenosis at the origin of the left vertebral artery (85%), without significant stenosis of the subclavian artery. Delayed flow is noted in the left vertebral artery distal to the stenosis. 3. The intracranial left vertebral artery, basilar artery, and bilateral posterior cerebral arteries are unremarkable. PROCEDURE: Frontal and lateral angiograms of the neck were obtained via left subclavian contrast injection. Using biplane roadmap guidance, a 4.5 X 20 mm synergy balloon mounted stent was navigated over a 0.014 inch Aristotle guidewire into the proximal left vertebral artery. Frontal and lateral angiograms were obtained to evaluate stent positioning. The stent is at the proximal left vertebral artery with the proximal end at the level of the ostium. A 0.035" Terumo Glidewire was advanced into the distal left subclavian artery for support. The balloon was then inflated to its nominal pressure, expanding the stent within the proximal left vertebral artery. Left subclavian artery angiograms with frontal lateral views of the neck showed adequate stent positioning and expansion with complete resolution of stenosis. Angiograms with frontal and lateral views of the head showed no evidence of thromboembolic complication with improved opacification of the intracranial left vertebral artery, basilar artery and its main branches, and bilateral posterior cerebral arteries. Delayed angiograms showed no evidence of in stent clot formation. The catheter was subsequently withdrawn. Right common femoral artery angiogram was obtained in right anterior oblique view. The  puncture is at the level of the common femoral artery. The artery has normal caliber, adequate for closure device. The sheath was exchanged over the wire for a Perclose ProGlide which was utilized for access closure. Immediate hemostasis was achieved. IMPRESSION: Successful and uncomplicated angioplasty and stenting of a severe left vertebral artery ostial stenosis. PLAN: 1. Transferred to ICU for monitoring for 24 hours. 2. Continue Plavix 75 mg and aspirin 81 mg q.d. Electronically Signed   By: Pedro Earls M.D.   On: 03/24/2022 14:33   IR US Guide Vasc Access Right  Result Date: 03/23/2022 INDICATION: 61 year old male with past medical history significant for diabetes, hypertension, hyperlipidemia, migraines, sleep apnea who presented to the ED for evaluation after an episode of ataxic gait, vertigo and nausea. Symptoms resolved after 20 minutes. CT of the head without contrast showed no acute intracranial abnormality. CT angiogram of the head and neck showed multifocal severe stenosis in bilateral carotid bifurcation and origin of the vertebral arteries. Due to densely calcified plaques, decision was in made to proceed with a diagnostic cerebral angiogram to better evaluate the degree of stenosis. EXAM: BILATERAL COMMON CAROTID AND INNOMINATE ANGIOGRAPHY AND BILATERAL VERTEBRAL ARTERY ANGIOGRAMS MEDICATIONS: No antibiotics administered. ANESTHESIA/SEDATION: Moderate (conscious) sedation was employed during this procedure. A total of Versed 2 mg and Fentanyl 50 mcg was administered intravenously by the radiology nurse. Total intra-service moderate Sedation Time: 43 minutes. The patient's level of consciousness and vital signs were monitored continuously by radiology nursing throughout the procedure under my direct supervision. FLUOROSCOPY: Radiation Exposure Index (as provided by the fluoroscopic device): 884 mGy Kerma COMPLICATIONS: None immediate. TECHNIQUE: Informed written consent  was  obtained from the patient after a thorough discussion of the procedural risks, benefits and alternatives. All questions were addressed. Maximal Sterile Barrier Technique was utilized including caps, mask, sterile gowns, sterile gloves, sterile drape, hand hygiene and skin antiseptic. A timeout was performed prior to the initiation of the procedure. The right groin was prepped and draped in the usual sterile fashion. Using a micropuncture kit and the modified Seldinger technique, access was gained to the right common femoral artery and a 5 French sheath was placed. Real-time ultrasound guidance was utilized for vascular access including the acquisition of a permanent ultrasound image documenting patency of the accessed vessel. Under fluoroscopy, a 5 [place] Berenstein 2 catheter was navigated over 0.035" Terumo Glidewire into the aortic arch. The catheter was placed into the left subclavian artery. Frontal, lateral and multiple oblique angiograms of the neck were obtained. Then, frontal and lateral view of the head was obtained. The catheter was then placed into the right common carotid artery. Frontal, lateral and bilateral oblique angiograms of the neck were obtained. Then, frontal and lateral angiograms of the head were obtained. The catheter was subsequently placed into the right subclavian artery. Frontal and lateral angiograms of the neck were obtained followed by frontal and lateral angiograms of the head. Next, the catheter was placed into the left common carotid artery. Frontal, lateral and bilateral oblique angiograms of the neck were obtained followed by frontal and lateral angiograms of the head. The catheter was subsequently withdrawn. Right common femoral artery angiogram was obtained in right anterior oblique view. The puncture is at the level of the common femoral artery. The artery has normal caliber, adequate for closure device. A 5 [place] Exoseal was utilized for access closure. Immediate hemostasis  was achieved. PROCEDURE: No intervention was performed. FINDINGS: Left subclavian angiograms: Severely calcified plaque in the left subclavian artery extending to the at the origin of the left vertebral artery, resulting in severe stenosis at the origin of the left vertebral artery (85%), without significant stenosis of the subclavian artery. Delayed flow is noted in the left vertebral artery distal to the stenosis. Left vertebral artery angiograms: The intracranial left vertebral artery, basilar artery, and bilateral posterior cerebral arteries are unremarkable. Luminal caliber is smooth and tapering. No aneurysms or abnormally high-flow, early draining veins are seen. No regions of abnormal hypervascularity are noted. The visualized dural sinuses are patent. Right CCA angiograms: Cervical angiograms show prominently calcified plaques along the right carotid bulb resulting in approximately 80% stenosis. Right ICA angiograms: There is brisk vascular contrast filling of the right ACA and MCA vascular trees. There is also brisk contrast opacification of the right PCA vascular tree via a prominent posterior communicating artery. Luminal caliber is smooth and tapering. No aneurysms or abnormally high-flow, early draining veins are seen. No regions of abnormal hypervascularity are noted. The visualized dural sinuses are patent. The visualized branches of the right external carotid artery are unremarkable. Right subclavian angiograms: Atherosclerotic changes at the origin of the right vertebral artery resulting in approximately 70% stenosis. The right subclavian artery have normal caliber. Right vertebral artery angiograms: Mild atherosclerotic changes at the V4 segment of the right vertebral artery without hemodynamically significant stenosis. The basilar artery, and bilateral posterior cerebral arteries are unremarkable. Luminal caliber is smooth and tapering. No aneurysms or abnormally high-flow, early draining veins  are seen. No regions of abnormal hypervascularity are noted. The visualized dural sinuses are patent. Left CCA angiograms: Prominently calcified plaques in the left carotid bifurcation resulting  in approximately 80% stenosis. Left ICA angiograms: There is brisk vascular contrast filling of the left ACA and MCA vascular trees. Luminal caliber is smooth and tapering. No aneurysms or abnormally high-flow, early draining veins are seen. No regions of abnormal hypervascularity are noted. The visualized dural sinuses are patent. The visualized branches of the left external carotid artery are unremarkable. Right common femoral artery angiograms: The access is at the level of the mid right common femoral artery. The femoral artery has normal caliber, adequate for closure device. IMPRESSION: 1. Severe atherosclerotic disease at the origin of the left vertebral artery resulting in approximately 85% stenosis. 2. Severe atherosclerotic disease at the right carotid bifurcation resulting approximately 80% stenosis. 3. Severe atherosclerotic disease at the left carotid bifurcation resulting in approximately 80% stenosis. 4. Atherosclerotic disease at the origin of the right vertebral artery resulting in approximately 70% stenosis. Electronically Signed   By: Pedro Earls M.D.   On: 03/23/2022 14:20   IR ANGIO VERTEBRAL SEL SUBCLAVIAN INNOMINATE BILAT MOD SED  Result Date: 03/23/2022 INDICATION: 61 year old male with past medical history significant for diabetes, hypertension, hyperlipidemia, migraines, sleep apnea who presented to the ED for evaluation after an episode of ataxic gait, vertigo and nausea. Symptoms resolved after 20 minutes. CT of the head without contrast showed no acute intracranial abnormality. CT angiogram of the head and neck showed multifocal severe stenosis in bilateral carotid bifurcation and origin of the vertebral arteries. Due to densely calcified plaques, decision was in made to  proceed with a diagnostic cerebral angiogram to better evaluate the degree of stenosis. EXAM: BILATERAL COMMON CAROTID AND INNOMINATE ANGIOGRAPHY AND BILATERAL VERTEBRAL ARTERY ANGIOGRAMS MEDICATIONS: No antibiotics administered. ANESTHESIA/SEDATION: Moderate (conscious) sedation was employed during this procedure. A total of Versed 2 mg and Fentanyl 50 mcg was administered intravenously by the radiology nurse. Total intra-service moderate Sedation Time: 43 minutes. The patient's level of consciousness and vital signs were monitored continuously by radiology nursing throughout the procedure under my direct supervision. FLUOROSCOPY: Radiation Exposure Index (as provided by the fluoroscopic device): 789 mGy Kerma COMPLICATIONS: None immediate. TECHNIQUE: Informed written consent was obtained from the patient after a thorough discussion of the procedural risks, benefits and alternatives. All questions were addressed. Maximal Sterile Barrier Technique was utilized including caps, mask, sterile gowns, sterile gloves, sterile drape, hand hygiene and skin antiseptic. A timeout was performed prior to the initiation of the procedure. The right groin was prepped and draped in the usual sterile fashion. Using a micropuncture kit and the modified Seldinger technique, access was gained to the right common femoral artery and a 5 French sheath was placed. Real-time ultrasound guidance was utilized for vascular access including the acquisition of a permanent ultrasound image documenting patency of the accessed vessel. Under fluoroscopy, a 5 [place] Berenstein 2 catheter was navigated over 0.035" Terumo Glidewire into the aortic arch. The catheter was placed into the left subclavian artery. Frontal, lateral and multiple oblique angiograms of the neck were obtained. Then, frontal and lateral view of the head was obtained. The catheter was then placed into the right common carotid artery. Frontal, lateral and bilateral oblique  angiograms of the neck were obtained. Then, frontal and lateral angiograms of the head were obtained. The catheter was subsequently placed into the right subclavian artery. Frontal and lateral angiograms of the neck were obtained followed by frontal and lateral angiograms of the head. Next, the catheter was placed into the left common carotid artery. Frontal, lateral and bilateral oblique angiograms of  the neck were obtained followed by frontal and lateral angiograms of the head. The catheter was subsequently withdrawn. Right common femoral artery angiogram was obtained in right anterior oblique view. The puncture is at the level of the common femoral artery. The artery has normal caliber, adequate for closure device. A 5 [place] Exoseal was utilized for access closure. Immediate hemostasis was achieved. PROCEDURE: No intervention was performed. FINDINGS: Left subclavian angiograms: Severely calcified plaque in the left subclavian artery extending to the at the origin of the left vertebral artery, resulting in severe stenosis at the origin of the left vertebral artery (85%), without significant stenosis of the subclavian artery. Delayed flow is noted in the left vertebral artery distal to the stenosis. Left vertebral artery angiograms: The intracranial left vertebral artery, basilar artery, and bilateral posterior cerebral arteries are unremarkable. Luminal caliber is smooth and tapering. No aneurysms or abnormally high-flow, early draining veins are seen. No regions of abnormal hypervascularity are noted. The visualized dural sinuses are patent. Right CCA angiograms: Cervical angiograms show prominently calcified plaques along the right carotid bulb resulting in approximately 80% stenosis. Right ICA angiograms: There is brisk vascular contrast filling of the right ACA and MCA vascular trees. There is also brisk contrast opacification of the right PCA vascular tree via a prominent posterior communicating artery.  Luminal caliber is smooth and tapering. No aneurysms or abnormally high-flow, early draining veins are seen. No regions of abnormal hypervascularity are noted. The visualized dural sinuses are patent. The visualized branches of the right external carotid artery are unremarkable. Right subclavian angiograms: Atherosclerotic changes at the origin of the right vertebral artery resulting in approximately 70% stenosis. The right subclavian artery have normal caliber. Right vertebral artery angiograms: Mild atherosclerotic changes at the V4 segment of the right vertebral artery without hemodynamically significant stenosis. The basilar artery, and bilateral posterior cerebral arteries are unremarkable. Luminal caliber is smooth and tapering. No aneurysms or abnormally high-flow, early draining veins are seen. No regions of abnormal hypervascularity are noted. The visualized dural sinuses are patent. Left CCA angiograms: Prominently calcified plaques in the left carotid bifurcation resulting in approximately 80% stenosis. Left ICA angiograms: There is brisk vascular contrast filling of the left ACA and MCA vascular trees. Luminal caliber is smooth and tapering. No aneurysms or abnormally high-flow, early draining veins are seen. No regions of abnormal hypervascularity are noted. The visualized dural sinuses are patent. The visualized branches of the left external carotid artery are unremarkable. Right common femoral artery angiograms: The access is at the level of the mid right common femoral artery. The femoral artery has normal caliber, adequate for closure device. IMPRESSION: 1. Severe atherosclerotic disease at the origin of the left vertebral artery resulting in approximately 85% stenosis. 2. Severe atherosclerotic disease at the right carotid bifurcation resulting approximately 80% stenosis. 3. Severe atherosclerotic disease at the left carotid bifurcation resulting in approximately 80% stenosis. 4. Atherosclerotic  disease at the origin of the right vertebral artery resulting in approximately 70% stenosis. Electronically Signed   By: Pedro Earls M.D.   On: 03/23/2022 14:20   IR ANGIO INTRA EXTRACRAN SEL COM CAROTID INNOMINATE BILAT MOD SED  Result Date: 03/23/2022 INDICATION: 61 year old male with past medical history significant for diabetes, hypertension, hyperlipidemia, migraines, sleep apnea who presented to the ED for evaluation after an episode of ataxic gait, vertigo and nausea. Symptoms resolved after 20 minutes. CT of the head without contrast showed no acute intracranial abnormality. CT angiogram of the head and neck  showed multifocal severe stenosis in bilateral carotid bifurcation and origin of the vertebral arteries. Due to densely calcified plaques, decision was in made to proceed with a diagnostic cerebral angiogram to better evaluate the degree of stenosis. EXAM: BILATERAL COMMON CAROTID AND INNOMINATE ANGIOGRAPHY AND BILATERAL VERTEBRAL ARTERY ANGIOGRAMS MEDICATIONS: No antibiotics administered. ANESTHESIA/SEDATION: Moderate (conscious) sedation was employed during this procedure. A total of Versed 2 mg and Fentanyl 50 mcg was administered intravenously by the radiology nurse. Total intra-service moderate Sedation Time: 43 minutes. The patient's level of consciousness and vital signs were monitored continuously by radiology nursing throughout the procedure under my direct supervision. FLUOROSCOPY: Radiation Exposure Index (as provided by the fluoroscopic device): 417 mGy Kerma COMPLICATIONS: None immediate. TECHNIQUE: Informed written consent was obtained from the patient after a thorough discussion of the procedural risks, benefits and alternatives. All questions were addressed. Maximal Sterile Barrier Technique was utilized including caps, mask, sterile gowns, sterile gloves, sterile drape, hand hygiene and skin antiseptic. A timeout was performed prior to the initiation of the  procedure. The right groin was prepped and draped in the usual sterile fashion. Using a micropuncture kit and the modified Seldinger technique, access was gained to the right common femoral artery and a 5 French sheath was placed. Real-time ultrasound guidance was utilized for vascular access including the acquisition of a permanent ultrasound image documenting patency of the accessed vessel. Under fluoroscopy, a 5 [place] Berenstein 2 catheter was navigated over 0.035" Terumo Glidewire into the aortic arch. The catheter was placed into the left subclavian artery. Frontal, lateral and multiple oblique angiograms of the neck were obtained. Then, frontal and lateral view of the head was obtained. The catheter was then placed into the right common carotid artery. Frontal, lateral and bilateral oblique angiograms of the neck were obtained. Then, frontal and lateral angiograms of the head were obtained. The catheter was subsequently placed into the right subclavian artery. Frontal and lateral angiograms of the neck were obtained followed by frontal and lateral angiograms of the head. Next, the catheter was placed into the left common carotid artery. Frontal, lateral and bilateral oblique angiograms of the neck were obtained followed by frontal and lateral angiograms of the head. The catheter was subsequently withdrawn. Right common femoral artery angiogram was obtained in right anterior oblique view. The puncture is at the level of the common femoral artery. The artery has normal caliber, adequate for closure device. A 5 [place] Exoseal was utilized for access closure. Immediate hemostasis was achieved. PROCEDURE: No intervention was performed. FINDINGS: Left subclavian angiograms: Severely calcified plaque in the left subclavian artery extending to the at the origin of the left vertebral artery, resulting in severe stenosis at the origin of the left vertebral artery (85%), without significant stenosis of the subclavian  artery. Delayed flow is noted in the left vertebral artery distal to the stenosis. Left vertebral artery angiograms: The intracranial left vertebral artery, basilar artery, and bilateral posterior cerebral arteries are unremarkable. Luminal caliber is smooth and tapering. No aneurysms or abnormally high-flow, early draining veins are seen. No regions of abnormal hypervascularity are noted. The visualized dural sinuses are patent. Right CCA angiograms: Cervical angiograms show prominently calcified plaques along the right carotid bulb resulting in approximately 80% stenosis. Right ICA angiograms: There is brisk vascular contrast filling of the right ACA and MCA vascular trees. There is also brisk contrast opacification of the right PCA vascular tree via a prominent posterior communicating artery. Luminal caliber is smooth and tapering. No aneurysms or abnormally  high-flow, early draining veins are seen. No regions of abnormal hypervascularity are noted. The visualized dural sinuses are patent. The visualized branches of the right external carotid artery are unremarkable. Right subclavian angiograms: Atherosclerotic changes at the origin of the right vertebral artery resulting in approximately 70% stenosis. The right subclavian artery have normal caliber. Right vertebral artery angiograms: Mild atherosclerotic changes at the V4 segment of the right vertebral artery without hemodynamically significant stenosis. The basilar artery, and bilateral posterior cerebral arteries are unremarkable. Luminal caliber is smooth and tapering. No aneurysms or abnormally high-flow, early draining veins are seen. No regions of abnormal hypervascularity are noted. The visualized dural sinuses are patent. Left CCA angiograms: Prominently calcified plaques in the left carotid bifurcation resulting in approximately 80% stenosis. Left ICA angiograms: There is brisk vascular contrast filling of the left ACA and MCA vascular trees. Luminal  caliber is smooth and tapering. No aneurysms or abnormally high-flow, early draining veins are seen. No regions of abnormal hypervascularity are noted. The visualized dural sinuses are patent. The visualized branches of the left external carotid artery are unremarkable. Right common femoral artery angiograms: The access is at the level of the mid right common femoral artery. The femoral artery has normal caliber, adequate for closure device. IMPRESSION: 1. Severe atherosclerotic disease at the origin of the left vertebral artery resulting in approximately 85% stenosis. 2. Severe atherosclerotic disease at the right carotid bifurcation resulting approximately 80% stenosis. 3. Severe atherosclerotic disease at the left carotid bifurcation resulting in approximately 80% stenosis. 4. Atherosclerotic disease at the origin of the right vertebral artery resulting in approximately 70% stenosis. Electronically Signed   By: Pedro Earls M.D.   On: 03/23/2022 14:20   DG CHEST PORT 1 VIEW  Result Date: 03/22/2022 CLINICAL DATA:  Recent TIA EXAM: PORTABLE CHEST 1 VIEW COMPARISON:  10/17/2006 FINDINGS: The heart size and mediastinal contours are within normal limits. Both lungs are clear. The visualized skeletal structures are unremarkable. IMPRESSION: No active disease. Electronically Signed   By: Inez Catalina M.D.   On: 03/22/2022 22:38   MR BRAIN WO CONTRAST  Result Date: 03/22/2022 CLINICAL DATA:  61 year old male TIA. Dizziness. Unsteady gait. Neurologic deficit. EXAM: MRI HEAD WITHOUT CONTRAST TECHNIQUE: Multiplanar, multiecho pulse sequences of the brain and surrounding structures were obtained without intravenous contrast. COMPARISON:  CTA head and neck earlier today. FINDINGS: Brain: No restricted diffusion to suggest acute infarction. No midline shift, mass effect, evidence of mass lesion, ventriculomegaly, extra-axial collection or acute intracranial hemorrhage. Cervicomedullary junction and  pituitary are within normal limits. Scattered small subcortical white matter T2 and FLAIR hyperintense foci, mild to moderate for age. Occasional periventricular involvement. No cortical encephalomalacia or chronic cerebral blood products identified. Deep gray nuclei, brainstem, and cerebellum are normal for age. Vascular: Major intracranial vascular flow voids are preserved. Skull and upper cervical spine: Negative. Visualized bone marrow signal is within normal limits. Sinuses/Orbits: Negative. Other: Mastoids are clear. Grossly negative visible internal auditory structures. Normal stylomastoid foramina. Negative visible scalp and face. IMPRESSION: 1. No acute intracranial abnormality. 2. Mild to moderate for age nonspecific cerebral white matter signal changes, most commonly due to chronic small vessel disease. Electronically Signed   By: Genevie Ann M.D.   On: 03/22/2022 09:19   CT ANGIO HEAD NECK W WO CM  Result Date: 03/22/2022 CLINICAL DATA:  61 year old male TIA.  Dizziness. EXAM: CT ANGIOGRAPHY HEAD AND NECK TECHNIQUE: Multidetector CT imaging of the head and neck was performed using the standard  protocol during bolus administration of intravenous contrast. Multiplanar CT image reconstructions and MIPs were obtained to evaluate the vascular anatomy. Carotid stenosis measurements (when applicable) are obtained utilizing NASCET criteria, using the distal internal carotid diameter as the denominator. RADIATION DOSE REDUCTION: This exam was performed according to the departmental dose-optimization program which includes automated exposure control, adjustment of the mA and/or kV according to patient size and/or use of iterative reconstruction technique. CONTRAST:  83m OMNIPAQUE IOHEXOL 350 MG/ML SOLN COMPARISON:  Plain head CT 03/21/2022. FINDINGS: CTA NECK Skeleton: No acute osseous abnormality identified. Tympanic cavities and Visualized paranasal sinuses and mastoids are clear. Upper chest: Negative. Other  neck: Motion artifact at the soft palate, oropharynx. No acute neck soft tissue finding identified. Aortic arch: Mild arch atherosclerosis. Three vessel arch configuration. Right carotid system: Minimal brachiocephalic artery origin plaque without stenosis. Normal right CCA in total lateral calcified plaque which is just proximal to the bifurcation not resulting in stenosis (series 4, image 115). Patent carotid bifurcation. Bulky calcified plaque at the right ICA origin and bulb. Radiographic string sign stenosis results as seen on series 6 images 207 through 201, along a segment of up to 16 mm. Right ICA remains patent and is otherwise negative to the skull base. Left carotid system: Mild left CCA origin plaque without stenosis. Bulky calcified plaque beginning at the left carotid bifurcation and continuing into the left ICA origin and bulb with high-grade stenosis just before the left ICA origin on series 6, image 217 approaching a radiographic string sign. Origin and bulb calcified plaque then results in less than 50 % stenosis with respect to the distal vessel. Vertebral arteries: Calcified plaque at the right subclavian artery origin without stenosis. Calcified plaque near the right vertebral artery origin,. Additional right V1 (series 6, images 288 and 273) with at least moderate V1 segment stenosis. But the right vertebral artery remains patent to the skull base with no additional plaque. Proximal left subclavian artery calcified plaque with no significant stenosis but bulky and long segment nearly 2.5 cm calcified plaque throughout the proximal left vertebral artery (series 7, image 128) with high-grade stenosis. However, the left vertebral is mildly dominant and remains patent. No additional plaque or stenosis to the skull base. CTA HEAD Posterior circulation: Mildly dominant left vertebral V4 segment with normal left PICA origin and vertebrobasilar junction. No distal left vertebral plaque or stenosis.  Contralateral right vertebral V4 segment moderate calcified plaque and stenosis on series 6, image 147. But the right vertebral remains patent to the vertebrobasilar junction. The right AICA appears dominant. Patent basilar artery with mild irregularity. No significant basilar stenosis. Patent SCA and PCA origins. Right posterior communicating artery is present. Bilateral PCA branches are patent with mild irregularity. Anterior circulation: Both ICA siphons are patent. Only mild supraclinoid ICA calcified plaque. No significant ICA siphon stenosis. Patent carotid termini. Patent MCA and ACA origins. Normal anterior communicating artery. Bilateral ACA branches are within normal limits. Left MCA M1 segment and bifurcation are patent without stenosis. Left MCA branches appear mildly irregular. Right MCA M1 segment is tortuous and bifurcates early without stenosis. Right MCA branches appear patent with mild irregularity. Venous sinuses: Early contrast timing, grossly patent. Anatomic variants: Mildly dominant left vertebral artery. Review of the MIP images confirms the above findings IMPRESSION: 1. Negative for large vessel occlusion, but positive for bulky calcified plaque at both carotid bifurcations and the proximal left vertebral artery: - Right ICA origin and bulb RADIOGRAPHIC STRING SIGN. - proximal Left Vertebral  Artery RADIOGRAPHIC STRING SIGN. - Left Carotid Bifurcation High-grade Stenosis, approaching a radiographic string sign. 2. Also moderate Right Vertebral Artery V1 and V4 segment stenoses due to plaque. 3. Aortic Atherosclerosis (ICD10-I70.0). Electronically Signed   By: Genevie Ann M.D.   On: 03/22/2022 05:12   CT HEAD WO CONTRAST  Result Date: 03/21/2022 CLINICAL DATA:  Dizziness. EXAM: CT HEAD WITHOUT CONTRAST TECHNIQUE: Contiguous axial images were obtained from the base of the skull through the vertex without intravenous contrast. RADIATION DOSE REDUCTION: This exam was performed according to the  departmental dose-optimization program which includes automated exposure control, adjustment of the mA and/or kV according to patient size and/or use of iterative reconstruction technique. COMPARISON:  None Available. FINDINGS: Brain: No evidence of acute infarction, hemorrhage, hydrocephalus, extra-axial collection or mass lesion/mass effect. Vascular: No hyperdense vessel or unexpected calcification. Skull: Normal. Negative for fracture or focal lesion. Sinuses/Orbits: There is mild right ethmoid sinus mucosal thickening. Other: None. IMPRESSION: No acute intracranial abnormality. Electronically Signed   By: Virgina Norfolk M.D.   On: 03/21/2022 19:58   (Echo, Carotid, EGD, Colonoscopy, ERCP)    Subjective: Patient seen and examined.  Multiple family members at the bedside.  No overnight events.  Denies any complaints.  Mobilizing around.  No more dizziness lightheadedness.   Discharge Exam: Vitals:   03/25/22 1119 03/25/22 1130  BP: (!) 142/91 (!) 148/87  Pulse:    Resp:    Temp:    SpO2:     Vitals:   03/25/22 0700 03/25/22 0800 03/25/22 1119 03/25/22 1130  BP: 127/79 (!) 150/87 (!) 142/91 (!) 148/87  Pulse: 86 (!) 104    Resp: 17 20    Temp:  100 F (37.8 C)    TempSrc:  Axillary    SpO2: 97% 97%    Weight:      Height:        General: Pt is alert, awake, not in acute distress Cardiovascular: RRR, S1/S2 +, no rubs, no gallops Respiratory: CTA bilaterally, no wheezing, no rhonchi Abdominal: Soft, NT, ND, bowel sounds + Extremities: no edema, no cyanosis Groin with no swelling or redness.    The results of significant diagnostics from this hospitalization (including imaging, microbiology, ancillary and laboratory) are listed below for reference.     Microbiology: No results found for this or any previous visit (from the past 240 hour(s)).   Labs: BNP (last 3 results) No results for input(s): "BNP" in the last 8760 hours. Basic Metabolic Panel: Recent Labs  Lab  03/21/22 1929 03/22/22 2220 03/23/22 0240  NA 131* 129* 130*  K 4.2 4.0 3.9  CL 98 98 100  CO2 _0 GLUCOSE 153* 171* 100*  BUN _1 CREATININE 1.02 1.03 0.99  CALCIUM 8.8* 8.5* 8.5*  MG  --  2.0  --    Liver Function Tests: Recent Labs  Lab 03/21/22 1929 03/22/22 2220 03/23/22 0240  AST 36 33 30  ALT 41 45* 40  ALKPHOS 69 60 57  BILITOT 1.8* 1.8* 1.5*  PROT 7.4 6.6 6.3*  ALBUMIN 4.3 3.6 3.4*   No results for input(s): "LIPASE", "AMYLASE" in the last 168 hours. No results for input(s): "AMMONIA" in the last 168 hours. CBC: Recent Labs  Lab 03/21/22 1929 03/22/22 2220 03/23/22 0240  WBC 8.2 7.0 8.1  NEUTROABS 5.0 4.0  --   HGB 14.5 14.7 14.2  HCT 41.1 40.1 38.9*  MCV 90.3 89.1 88.8  PLT 204 209 194  Cardiac Enzymes: No results for input(s): "CKTOTAL", "CKMB", "CKMBINDEX", "TROPONINI" in the last 168 hours. BNP: Invalid input(s): "POCBNP" CBG: Recent Labs  Lab 03/24/22 1614 03/24/22 2025 03/24/22 2309 03/25/22 0437 03/25/22 0722  GLUCAP 179* 224* 164* 149* 104*   D-Dimer No results for input(s): "DDIMER" in the last 72 hours. Hgb A1c No results for input(s): "HGBA1C" in the last 72 hours. Lipid Profile Recent Labs    03/22/22 2220  CHOL 113  HDL 32*  LDLCALC 62  TRIG 95  CHOLHDL 3.5   Thyroid function studies Recent Labs    03/22/22 2220  TSH 2.541   Anemia work up No results for input(s): "VITAMINB12", "FOLATE", "FERRITIN", "TIBC", "IRON", "RETICCTPCT" in the last 72 hours. Urinalysis    Component Value Date/Time   COLORURINE YELLOW 03/22/2022 2300   APPEARANCEUR CLEAR 03/22/2022 2300   LABSPEC 1.026 03/22/2022 2300   PHURINE 6.0 03/22/2022 2300   GLUCOSEU >=500 (A) 03/22/2022 2300   HGBUR NEGATIVE 03/22/2022 2300   BILIRUBINUR NEGATIVE 03/22/2022 2300   KETONESUR NEGATIVE 03/22/2022 2300   PROTEINUR NEGATIVE 03/22/2022 2300   NITRITE NEGATIVE 03/22/2022 2300   LEUKOCYTESUR NEGATIVE 03/22/2022 2300   Sepsis  Labs Recent Labs  Lab 03/21/22 1929 03/22/22 2220 03/23/22 0240  WBC 8.2 7.0 8.1   Microbiology No results found for this or any previous visit (from the past 240 hour(s)).   Time coordinating discharge: 35 minutes  SIGNED:   Barb Merino, MD  [facility] 03/25/2022, 1:01 PM

## 2022-03-27 ENCOUNTER — Telehealth: Payer: Self-pay

## 2022-03-27 ENCOUNTER — Encounter (HOSPITAL_COMMUNITY): Payer: Self-pay | Admitting: Neuroradiology

## 2022-03-27 NOTE — Anesthesia Postprocedure Evaluation (Signed)
Anesthesia Post Note  Patient: Calvin Smith  Procedure(s) Performed: IR WITH ANESTHESIA (Left)     Patient location during evaluation: PACU Anesthesia Type: General Level of consciousness: awake and alert Pain management: pain level controlled Vital Signs Assessment: post-procedure vital signs reviewed and stable Respiratory status: spontaneous breathing, nonlabored ventilation, respiratory function stable and patient connected to nasal cannula oxygen Cardiovascular status: blood pressure returned to baseline and stable Postop Assessment: no apparent nausea or vomiting Anesthetic complications: no   No notable events documented.  Last Vitals:  Vitals:   03/25/22 1119 03/25/22 1130  BP: (!) 142/91 (!) 148/87  Pulse:    Resp:    Temp:    SpO2:      Last Pain:  Vitals:   03/25/22 0800  TempSrc: Axillary  PainSc: 0-No pain   Pain Goal: Patients Stated Pain Goal: 0 (03/23/22 0742)                 Tiajuana Amass

## 2022-03-27 NOTE — Telephone Encounter (Signed)
Transition Care Management Follow-up Telephone Call Date of discharge and from where: [facility] How have you been since you were released from the hospital? Better, but still a little weak. Any questions or concerns? No  Items Reviewed: Did the pt receive and understand the discharge instructions provided? Yes , except he is unsure when he can remove the femoral artery bandage. This is not stated in d/c summary. Medications obtained and verified? Yes , pt reports he was started on Plavix but this is to be short-term Other? No  Any new allergies since your discharge? No  Dietary orders reviewed? No Do you have support at home? Yes   Home Care and Equipment/Supplies: Were home health services ordered? no If so, what is the name of the agency? N/a  Has the agency set up a time to come to the patient's home? not applicable Were any new equipment or medical supplies ordered?  No What is the name of the medical supply agency? N/a Were you able to get the supplies/equipment? not applicable Do you have any questions related to the use of the equipment or supplies? No  Functional Questionnaire: (I = Independent and D = Dependent) ADLs: I  Bathing/Dressing- I  Meal Prep- I  Eating- I  Maintaining continence- I  Transferring/Ambulation- I  Managing Meds- I  Follow up appointments reviewed:  PCP Hospital f/u appt confirmed? Yes  Scheduled to see Dr. Silvio Pate on 8/22 @ 10:45. [facility] f/u appt confirmed? No  ; pt is to schedule with neurology. Are transportation arrangements needed? No  If their condition worsens, is the pt aware to call PCP or go to the Emergency Dept.? Yes Was the patient provided with contact information for the PCP's office or ED? Yes Was to pt encouraged to call back with questions or concerns? Yes

## 2022-03-28 ENCOUNTER — Encounter: Payer: Self-pay | Admitting: Internal Medicine

## 2022-03-28 ENCOUNTER — Ambulatory Visit: Payer: 59 | Admitting: Internal Medicine

## 2022-03-28 DIAGNOSIS — E1159 Type 2 diabetes mellitus with other circulatory complications: Secondary | ICD-10-CM

## 2022-03-28 DIAGNOSIS — I10 Essential (primary) hypertension: Secondary | ICD-10-CM | POA: Diagnosis not present

## 2022-03-28 DIAGNOSIS — N401 Enlarged prostate with lower urinary tract symptoms: Secondary | ICD-10-CM | POA: Diagnosis not present

## 2022-03-28 DIAGNOSIS — G459 Transient cerebral ischemic attack, unspecified: Secondary | ICD-10-CM

## 2022-03-28 DIAGNOSIS — Z794 Long term (current) use of insulin: Secondary | ICD-10-CM

## 2022-03-28 NOTE — Assessment & Plan Note (Signed)
His symptoms are severe---but I am concerned about potential side effects for now Will plan to start tamsulosin once his carotid interventions are done

## 2022-03-28 NOTE — Assessment & Plan Note (Signed)
Symptoms were posterior circulation---so vertebral artery stented first Now still needs interventions for both carotids---waiting to hear from interventional radiology about this On atorvastatin '80mg'$ , ASA 81, plavix 75 Good BP and diabetes control--no change in meds for that  Okay to resume typical activities Discussed that it may be best to avoid full time work responsibilities until the rest of the vascular evaluations
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
## 2022-03-28 NOTE — Assessment & Plan Note (Signed)
BP Readings from Last 3 Encounters:  03/28/22 110/68  03/25/22 (!) 148/87  03/22/22 (!) 155/86   Lisinopril 20 daily

## 2022-03-28 NOTE — Assessment & Plan Note (Signed)
Lab Results  Component Value Date   HGBA1C 6.1 (H) 03/21/2022   Excellent control on the semaglutide '2mg'$  weekly, jardiance 25 daily, lantus 90 daily, metformin 1000 bid

## 2022-03-29 ENCOUNTER — Telehealth (HOSPITAL_COMMUNITY): Payer: Self-pay

## 2022-03-29 ENCOUNTER — Other Ambulatory Visit (HOSPITAL_COMMUNITY): Payer: Self-pay | Admitting: Student

## 2022-03-29 ENCOUNTER — Encounter (HOSPITAL_COMMUNITY): Payer: Self-pay

## 2022-03-29 ENCOUNTER — Encounter: Payer: Self-pay | Admitting: Internal Medicine

## 2022-03-29 DIAGNOSIS — I6502 Occlusion and stenosis of left vertebral artery: Secondary | ICD-10-CM

## 2022-03-29 HISTORY — PX: IR ANGIO VERTEBRAL SEL VERTEBRAL UNI L MOD SED: IMG5367

## 2022-03-29 HISTORY — PX: IR ANGIOGRAM EXTREMITY LEFT: IMG651

## 2022-03-29 LAB — PLATELET INHIBITION P2Y12

## 2022-03-29 NOTE — Telephone Encounter (Signed)
Called to schedule f/u with Dr. Debbrah Alar, no answer, left vm. AW

## 2022-03-30 ENCOUNTER — Telehealth: Payer: Self-pay

## 2022-03-30 NOTE — Telephone Encounter (Signed)
I called the pt and advised of recommendation. She verbalized understanding and appreciation for the call. He will keep f/u as scheduled for September

## 2022-03-30 NOTE — Telephone Encounter (Signed)
-----   Message from Suzzanne Cloud, NP sent at 03/30/2022  7:29 AM EDT ----- Can you please call the patient and let him know to continue current Plavix/Aspirin as mentioned below from IR MD. Looks like has appointment 8/31 with IR. Patient is seeing [place] later in Sept. Thanks.  ----- Message ----- From: Pedro Earls, MD Sent: 03/29/2022   4:52 PM EDT To: Monia Sabal, PA-C; Suzzanne Cloud, NP  [facility] Sarah,  This is in the upper limit of what we want. We will keep treatment as it as Plavix 75 mg/day and ASA 81 mg/day. We will order a new test before his next treatment so that we confirm he is within range.  Thanks, KdMR  ----- Message ----- From: Suzzanne Cloud, NP Sent: 03/29/2022   4:12 PM EDT To: Monia Sabal, PA-C; #  Hi there, please see result below P2Y12. This patient is coming to our office in September. Thanks, Sarah  ----- Message ----- From: [name], MD Sent: 03/29/2022   1:37 PM EDT To: Suzzanne Cloud, NP  This was ordered in the hospital with his stroke. I have not interpreted this in the past---so I am forwarding it to you

## 2022-04-05 ENCOUNTER — Other Ambulatory Visit: Payer: Self-pay | Admitting: Internal Medicine

## 2022-04-06 ENCOUNTER — Encounter: Payer: Self-pay | Admitting: Internal Medicine

## 2022-04-06 ENCOUNTER — Ambulatory Visit (HOSPITAL_COMMUNITY)
Admission: RE | Admit: 2022-04-06 | Discharge: 2022-04-06 | Disposition: A | Discharge status: Home / Self Care | Payer: 59 | Source: Ambulatory Visit | Attending: Radiology | Admitting: Radiology

## 2022-04-06 DIAGNOSIS — G450 Vertebro-basilar artery syndrome: Secondary | ICD-10-CM

## 2022-04-06 MED ORDER — RIZATRIPTAN BENZOATE 10 MG PO TABS: 10.0000 mg | ORAL_TABLET | ORAL | 3 refills | Status: DC | PRN

## 2022-04-06 MED ORDER — PANTOPRAZOLE SODIUM 40 MG PO TBEC: 40.0000 mg | DELAYED_RELEASE_TABLET | Freq: Every day | ORAL | 3 refills | Status: DC

## 2022-04-06 NOTE — Progress Notes (Signed)
Referring Physician(s): Turpin,Pamela  Chief Complaint: The patient is seen in follow up today s/p left vertebral artery stenting.  History of present illness:  Calvin Smith is a 61 y.o. male with past medical history significant for DM, HTN, HLD, migraine headaches, sleep apnea. Patent presented to the ED at [facility] on 8.16.23  One episode of unsteady gait, diaphoresis and dizziness that self resolved.  Code stroke was called at the patient was transferred to [facility] for further evaluation. Cerebral angiogram performed on 8.17.23 showed severe stenosis in the left verbal artery, right carotid bifurcation, left carotid bifurcation and right vertebral artery. He underwent stenting of the left vertebral artery on 08.18.23 for symptomatic vertebrobasilar insufficiency without incident. He comes today for post procedure evaluation and to discuss the need for bilateral carotid revascularization given the high degree of stenosis seen an angiography.   Mr. Bartnik has been doing well since the procedure with no complaints other than feeling a little tired. I explained to him that this could be a side effect of the plavix and that there are alternatives, such as brilinta, but he says symptom is mild and tolerable.    Past Medical History:  Diagnosis Date   Allergic rhinitis    Arm fracture, left as a child   DeQuervain's disease (tenosynovitis)    right wrist   Diabetes mellitus    type 2   Erectile dysfunction    Hyperlipidemia    Hypertension    Migraines    Sleep apnea     Past Surgical History:  Procedure Laterality Date   Cardiolite  5/06   Negative EF 66%   CARPAL TUNNEL RELEASE  9/03   left    IR ANGIO INTRA EXTRACRAN SEL COM CAROTID INNOMINATE BILAT MOD SED  03/23/2022   IR ANGIO VERTEBRAL SEL SUBCLAVIAN INNOMINATE BILAT MOD SED  03/23/2022   IR ANGIO VERTEBRAL SEL VERTEBRAL UNI L MOD SED  03/29/2022   IR ANGIOGRAM EXTREMITY LEFT  03/29/2022   IR TRANSCATH EXCRAN VERT OR CAR A STENT   03/24/2022   IR US GUIDE VASC ACCESS RIGHT  03/23/2022   IR US GUIDE VASC ACCESS RIGHT  03/24/2022   RADIOLOGY WITH ANESTHESIA Left 03/24/2022   Procedure: IR WITH ANESTHESIA;  Surgeon: Pedro Earls, MD;  Location: [place];  Service: Radiology;  Laterality: Left;   WRIST SURGERY  1/04   DeQuervain release   WRIST SURGERY  1/08   ORIF l wrist DVR plate screwhead autologous    Allergies: Bisoprolol-hydrochlorothiazide and Codeine  Medications: Prior to Admission medications   Medication Sig Start Date End Date Taking? Authorizing Provider  aspirin EC 81 MG tablet Take 81 mg by mouth daily.    [provider]  atorvastatin (LIPITOR) 80 MG tablet Take 1 tablet (80 mg total) by mouth daily. 03/23/22 06/21/22  Collier Bullock, MD  clopidogrel (PLAVIX) 75 MG tablet Take 1 tablet (75 mg total) by mouth daily. Patient not taking: Reported on 03/23/2022 03/23/22 04/22/22  Collier Bullock, MD  empagliflozin (JARDIANCE) 25 MG TABS tablet Take 25 mg by mouth daily.    [provider]  glipiZIDE (GLUCOTROL XL) 5 MG 24 hr tablet Take 5 mg by mouth in the morning and at bedtime.    [provider]  ibuprofen (ADVIL) 200 MG tablet Take 400-600 mg by mouth daily as needed for headache.    [provider]  LANTUS SOLOSTAR 100 UNIT/ML Solostar Pen INJECT SUBCUTANEOUSLY 90  UNITS AT BEDTIME Patient  taking differently: 45 Units 2 (two) times daily. 03/01/22   [name], MD  lisinopril (ZESTRIL) 20 MG tablet Take 20 mg by mouth daily.    [provider]  metFORMIN (GLUCOPHAGE) 1000 MG tablet Take 1,000 mg by mouth 2 (two) times daily with a meal.    [provider]  omeprazole (PRILOSEC) 20 MG capsule TAKE 1 CAPSULE BY MOUTH IN THE MORNING AND AT BEDTIME. Patient taking differently: Take 20 mg by mouth 2 (two) times daily before a meal. 10/03/21   [name], MD  Semaglutide, 2 MG/DOSE, (OZEMPIC, 2 MG/DOSE,) 8 MG/3ML SOPN Inject 2 mg  into the skin once a week.    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  Tobacco Use   Smoking status: Never    Passive exposure: Past   Smokeless tobacco: Never  Vaping Use   Vaping Use: Never used  Substance and Sexual Activity   Alcohol use: Yes    Comment: rare   Drug use: No   Sexual activity: Yes    Partners: Female  Other Topics Concern   Not on file  Social History Narrative   Regular exercise: light to moderate   Caffeine use: coffee daily   Social Determinants of Radio broadcast assistant Strain: Not on file  Food Insecurity: Not on file  Transportation Needs: Not on file  Physical Activity: Not on file  Stress: Not on file  Social Connections: Not on file     Vital Signs: There were no vitals taken for this visit.  Physical Exam Constitutional:      Appearance: Normal appearance.  HENT:     Head: Normocephalic and atraumatic.     Mouth/Throat:     Mouth: Mucous membranes are moist.     Pharynx: Oropharynx is clear.  Eyes:     Extraocular Movements: Extraocular movements intact.     Conjunctiva/sclera: Conjunctivae normal.     Pupils: Pupils are equal, round, and reactive to light.  Neurological:     Mental Status: He is alert and oriented to person, place, and time.     Cranial Nerves: Cranial nerves 2-12 are intact.     Sensory: Sensation is  intact.     Motor: Motor function is intact.     Coordination: Coordination is intact.     Gait: Gait is intact.     Imaging: No results found.  Labs:  CBC: Recent Labs    11/01/21 1143 03/21/22 1929 03/22/22 2220 03/23/22 0240  WBC 7.5 8.2 7.0 8.1  HGB 15.5 14.5 14.7 14.2  HCT 44.1 41.1 40.1 38.9*  PLT 236 204 209 194    COAGS: Recent Labs    03/21/22 1929  INR 1.1  APTT 28    BMP: Recent Labs    11/01/21 1143 03/21/22 1929 03/22/22 2220 03/23/22 0240  NA 138 131* 129* 130*  K 4.3 4.2 4.0 3.9  CL 97 98 98 100  CO2 '22 24 23 22  '$ GLUCOSE 54* 153* 171* 100*  BUN '13 15 11 10  '$ CALCIUM 9.2 8.8* 8.5* 8.5*  CREATININE 1.08 1.02 1.03 0.99  GFRNONAA  --  >60 >60 >60    LIVER FUNCTION TESTS: Recent Labs    11/01/21 1143 03/21/22 1929 03/22/22 2220 03/23/22 0240  BILITOT 0.7 1.8* 1.8* 1.5*  AST 40 36 33 30  ALT 48* 41 45* 40  ALKPHOS 77 69 60 57  PROT 6.7 7.4 6.6 6.3*  ALBUMIN 4.6 4.3 3.6 3.4*    Assessment:  Mr. Venia Minks is doing very well post left vertebral artery stenting without evidence of focal neurological deficits. We discussed the recommendation for carotid revascularization, along with risks and benefits. I suggested we address the left carotid first, given that it supplies the dominant hemisphere. He would like to proceed with the carotid stenting. His wife, who is present during the appointment, agrees with the plan. He should continue taking aspirin and plavix daily. All questions were answered to their satisfaction. Our schedulers will reach out to them to book the left carotid artery stenting under monitored anesthesia care.  Signed: Pedro Earls, MD 04/06/2022, 2:47 PM    I spent a total of    25 Minutes in face to face in clinical consultation, greater than 50% of which was counseling/coordinating care for vertebral and carotid arteries atherosclerotic disease.

## 2022-04-11 ENCOUNTER — Other Ambulatory Visit (HOSPITAL_COMMUNITY): Payer: Self-pay | Admitting: Neuroradiology

## 2022-04-11 DIAGNOSIS — I771 Stricture of artery: Secondary | ICD-10-CM

## 2022-04-13 ENCOUNTER — Other Ambulatory Visit: Payer: Self-pay | Admitting: Radiology

## 2022-04-13 DIAGNOSIS — I6523 Occlusion and stenosis of bilateral carotid arteries: Secondary | ICD-10-CM

## 2022-04-13 NOTE — H&P (Deleted)
  The note originally documented on this encounter has been moved the the encounter in which it belongs.  

## 2022-04-13 NOTE — H&P (Signed)
Chief Complaint: Patient was seen in consultation today for vertebral and carotid artery atherosclerotic disease at the request of [place] D[place] Rodrigues,[name]  Referring Physician(s): [place]  Supervising Physician: Pedro Earls  Patient Status: [facility] - Out-pt  History of Present Illness: Calvin Smith is a 61 y.o. male well known to [facility] service after angioplasty with stenting of left vertebral stenosis 03/24/2022. Pt has post-procedure follow up with Dr. Karenann Cai 04/06/22 that found him doing well with previous intervention. Discussion at that time was the need for bilateral carotid revascularization due to degree of stenosis seen on previous angiogram study. Pt agreed at that time to proceed with left carotid intervention.    Past Medical History:  Diagnosis Date   Allergic rhinitis    Arm fracture, left as a child   DeQuervain's disease (tenosynovitis)    right wrist   Diabetes mellitus    type 2   Erectile dysfunction    GERD (gastroesophageal reflux disease)    Hyperlipidemia    Hypertension    Migraines    Peripheral vascular disease (HCC)    Sleep apnea     Past Surgical History:  Procedure Laterality Date   Cardiolite  5/06   Negative EF 66%   CARPAL TUNNEL RELEASE  9/03   left    IR ANGIO INTRA EXTRACRAN SEL COM CAROTID INNOMINATE BILAT MOD SED  03/23/2022   IR ANGIO VERTEBRAL SEL SUBCLAVIAN INNOMINATE BILAT MOD SED  03/23/2022   IR ANGIO VERTEBRAL SEL VERTEBRAL UNI L MOD SED  03/29/2022   IR ANGIOGRAM EXTREMITY LEFT  03/29/2022   IR TRANSCATH EXCRAN VERT OR CAR A STENT  03/24/2022   IR US GUIDE VASC ACCESS RIGHT  03/23/2022   IR US GUIDE VASC ACCESS RIGHT  03/24/2022   RADIOLOGY WITH ANESTHESIA Left 03/24/2022   Procedure: IR WITH ANESTHESIA;  Surgeon: Pedro Earls, MD;  Location: [place];  Service: Radiology;  Laterality: Left;   WRIST SURGERY  1/04   DeQuervain release   WRIST SURGERY  1/08    ORIF l wrist DVR plate screwhead autologous    Allergies: Bisoprolol-hydrochlorothiazide and Codeine  Medications: Prior to Admission medications   Medication Sig Start Date End Date Taking? Authorizing Provider  aspirin EC 81 MG tablet Take 81 mg by mouth daily.    [provider]  atorvastatin (LIPITOR) 80 MG tablet Take 1 tablet (80 mg total) by mouth daily. 03/23/22 06/21/22  Collier Bullock, MD  clopidogrel (PLAVIX) 75 MG tablet Take 1 tablet (75 mg total) by mouth daily. 03/23/22 04/22/22  Agbata, Royce Macadamia, MD  empagliflozin (JARDIANCE) 25 MG TABS tablet Take 25 mg by mouth daily.    [provider]  glipiZIDE (GLUCOTROL XL) 5 MG 24 hr tablet Take 5 mg by mouth in the morning and at bedtime.    [provider]  ibuprofen (ADVIL) 200 MG tablet Take 400-600 mg by mouth every 8 (eight) hours as needed for headache.    [provider]  LANTUS SOLOSTAR 100 UNIT/ML Solostar Pen INJECT SUBCUTANEOUSLY 90  UNITS AT BEDTIME Patient taking differently: Inject 42 Units into the skin 2 (two) times daily. 03/01/22   [name], MD  lisinopril (ZESTRIL) 20 MG tablet Take 20 mg by mouth daily.    [provider]  metFORMIN (GLUCOPHAGE) 1000 MG tablet Take 1,000 mg by mouth 2 (two) times daily with a meal.    [provider]  pantoprazole (PROTONIX) 40 MG  tablet Take 1 tablet (40 mg total) by mouth daily. 04/06/22   [name], MD  rizatriptan (MAXALT) 10 MG tablet Take 1 tablet (10 mg total) by mouth as needed for migraine. May repeat in 2 hours if needed 04/06/22   [name], MD  Semaglutide, 2 MG/DOSE, (OZEMPIC, 2 MG/DOSE,) 8 MG/3ML SOPN Inject 2 mg into the skin every Saturday.    [provider]     Family History  Problem Relation Age of Onset   Hypertension Father    Lymphoma Father        non-hodgkins   Uterine cancer Maternal Grandmother    Hypertension Paternal Grandfather    Lung cancer Paternal Aunt     Coronary artery disease Paternal Uncle    Colon cancer Unknown        in 1 relative (?aunt)    Social History   Socioeconomic History   Marital status: Married    Spouse name: Not on file   Number of children: Not on file   Years of education: Not on file   Highest education level: Not on file  Occupational History   Occupation: Buyer, 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  Tobacco Use   Smoking status: Never    Passive exposure: Past   Smokeless tobacco: Never  Vaping Use   Vaping Use: Never used  Substance and Sexual Activity   Alcohol use: Yes    Comment: rare   Drug use: No   Sexual activity: Yes    Partners: Female  Other Topics Concern   Not on file  Social History Narrative   Regular exercise: light to moderate   Caffeine use: coffee daily   Social Determinants of Radio broadcast assistant Strain: Not on file  Food Insecurity: Not on file  Transportation Needs: Not on file  Physical Activity: Not on file  Stress: Not on file  Social Connections: Not on file    Review of Systems: A 12 point ROS discussed and pertinent positives are indicated in the HPI above.  All other systems are negative.  Review of Systems  All other systems reviewed and are negative.   Vital Signs: There were no vitals taken for this visit.    Physical Exam Vitals reviewed.  Constitutional:      General: He is not in acute distress.    Appearance: Normal appearance. He is not ill-appearing.  HENT:     Head: Normocephalic and atraumatic.     Mouth/Throat:     Mouth: Mucous membranes are dry.     Pharynx: Oropharynx is clear.  Eyes:     Extraocular Movements: Extraocular movements intact.     Pupils: Pupils are equal, round, and reactive to light.  Cardiovascular:     Rate and Rhythm: Normal rate and regular rhythm.     Pulses: Normal pulses.     Heart sounds: Normal heart sounds.  Pulmonary:     Effort: Pulmonary effort is normal. No respiratory  distress.     Breath sounds: Normal breath sounds.  Abdominal:     General: Bowel sounds are normal. There is no distension.     Palpations: Abdomen is soft.     Tenderness: There is no abdominal tenderness. There is no guarding.  Musculoskeletal:     Right lower leg: No edema.     Left lower leg: No edema.  Skin:    General: Skin is warm and dry.  Neurological:  Mental Status: He is alert and oriented to person, place, and time.  Psychiatric:        Mood and Affect: Mood normal.        Behavior: Behavior normal.        Thought Content: Thought content normal.        Judgment: Judgment normal.     Imaging: IR Danielle Dess Or Car A Stent  Result Date: 03/24/2022 INDICATION: 61 year old male with past medical history significant for diabetes, hypertension, hyperlipidemia, migraines, sleep apnea who presented to the ED for evaluation after an episode of ataxic gait, vertigo and nausea. Symptoms resolved after 20 minutes. CT of the head without contrast showed no acute intracranial abnormality. CT angiogram of the head and neck showed multifocal severe stenosis in bilateral carotid bifurcation and origin of the vertebral arteries densely calcified plaques. He underwent a diagnostic cerebral angiogram on March 24, 2019 3 seconds confirmed CT angiogram findings. He returns today for left vertebral artery angioplasty and stenting for treatment of severe ostial stenosis. EXAM: ULTRASOUND-GUIDED VASCULAR ACCESS DIAGNOSTIC CEREBRAL ANGIOGRAM LEFT VERTEBRAL ARTERY ANGIOPLASTY AND STENTING COMPARISON:  Cerebral angiogram March 23, 2022. MEDICATIONS: 6000 units of heparin IV. ANESTHESIA/SEDATION: The procedure was performed under general anesthesia. CONTRAST:  75 mL of Omnipaque 300 milligram/mL FLUOROSCOPY: Radiation Exposure Index (as provided by the fluoroscopic device): 314 mGy Kerma COMPLICATIONS: None immediate. TECHNIQUE: Informed written consent was obtained from the patient after a  thorough discussion of the procedural risks, benefits and alternatives. All questions were addressed. Maximal Sterile Barrier Technique was utilized including caps, mask, sterile gowns, sterile gloves, sterile drape, hand hygiene and skin antiseptic. A timeout was performed prior to the initiation of the procedure. The right groin was prepped and draped in the usual sterile fashion. Using a micropuncture kit and the modified Seldinger technique, access was gained to the right common femoral artery and an 8 French sheath was placed. Real-time ultrasound guidance was utilized for vascular access including the acquisition of a permanent ultrasound image documenting patency of the accessed vessel. Under fluoroscopy, a Cerebase guide catheter was navigated over a 0.035" Terumo Glidewire into the aortic arch. The catheter was placed into the left subclavian artery. Frontal and lateral angiograms of the head were obtained followed by frontal and lateral angiograms of the neck. FINDINGS: 1. Normal caliber of the right common femoral artery, adequate for vascular access. 2. Severely calcified plaque in the left subclavian artery extending to the at the origin of the left vertebral artery, resulting in severe stenosis at the origin of the left vertebral artery (85%), without significant stenosis of the subclavian artery. Delayed flow is noted in the left vertebral artery distal to the stenosis. 3. The intracranial left vertebral artery, basilar artery, and bilateral posterior cerebral arteries are unremarkable. PROCEDURE: Frontal and lateral angiograms of the neck were obtained via left subclavian contrast injection. Using biplane roadmap guidance, a 4.5 X 20 mm synergy balloon mounted stent was navigated over a 0.014 inch Aristotle guidewire into the proximal left vertebral artery. Frontal and lateral angiograms were obtained to evaluate stent positioning. The stent is at the proximal left vertebral artery with the proximal  end at the level of the ostium. A 0.035" Terumo Glidewire was advanced into the distal left subclavian artery for support. The balloon was then inflated to its nominal pressure, expanding the stent within the proximal left vertebral artery. Left subclavian artery angiograms with frontal lateral views of the neck showed adequate stent positioning and expansion with complete  resolution of stenosis. Angiograms with frontal and lateral views of the head showed no evidence of thromboembolic complication with improved opacification of the intracranial left vertebral artery, basilar artery and its main branches, and bilateral posterior cerebral arteries. Delayed angiograms showed no evidence of in stent clot formation. The catheter was subsequently withdrawn. Right common femoral artery angiogram was obtained in right anterior oblique view. The puncture is at the level of the common femoral artery. The artery has normal caliber, adequate for closure device. The sheath was exchanged over the wire for a Perclose ProGlide which was utilized for access closure. Immediate hemostasis was achieved. IMPRESSION: Successful and uncomplicated angioplasty and stenting of a severe left vertebral artery ostial stenosis. PLAN: 1. Transferred to ICU for monitoring for 24 hours. 2. Continue Plavix 75 mg and aspirin 81 mg q.d. Electronically Signed   By: Pedro Earls M.D.   On: 03/24/2022 14:33   IR US Guide Vasc Access Right  Result Date: 03/24/2022 INDICATION: 61 year old male with past medical history significant for diabetes, hypertension, hyperlipidemia, migraines, sleep apnea who presented to the ED for evaluation after an episode of ataxic gait, vertigo and nausea. Symptoms resolved after 20 minutes. CT of the head without contrast showed no acute intracranial abnormality. CT angiogram of the head and neck showed multifocal severe stenosis in bilateral carotid bifurcation and origin of the vertebral arteries  densely calcified plaques. He underwent a diagnostic cerebral angiogram on March 24, 2019 3 seconds confirmed CT angiogram findings. He returns today for left vertebral artery angioplasty and stenting for treatment of severe ostial stenosis. EXAM: ULTRASOUND-GUIDED VASCULAR ACCESS DIAGNOSTIC CEREBRAL ANGIOGRAM LEFT VERTEBRAL ARTERY ANGIOPLASTY AND STENTING COMPARISON:  Cerebral angiogram March 23, 2022. MEDICATIONS: 6000 units of heparin IV. ANESTHESIA/SEDATION: The procedure was performed under general anesthesia. CONTRAST:  75 mL of Omnipaque 300 milligram/mL FLUOROSCOPY: Radiation Exposure Index (as provided by the fluoroscopic device): 341 mGy Kerma COMPLICATIONS: None immediate. TECHNIQUE: Informed written consent was obtained from the patient after a thorough discussion of the procedural risks, benefits and alternatives. All questions were addressed. Maximal Sterile Barrier Technique was utilized including caps, mask, sterile gowns, sterile gloves, sterile drape, hand hygiene and skin antiseptic. A timeout was performed prior to the initiation of the procedure. The right groin was prepped and draped in the usual sterile fashion. Using a micropuncture kit and the modified Seldinger technique, access was gained to the right common femoral artery and an 8 French sheath was placed. Real-time ultrasound guidance was utilized for vascular access including the acquisition of a permanent ultrasound image documenting patency of the accessed vessel. Under fluoroscopy, a Cerebase guide catheter was navigated over a 0.035" Terumo Glidewire into the aortic arch. The catheter was placed into the left subclavian artery. Frontal and lateral angiograms of the head were obtained followed by frontal and lateral angiograms of the neck. FINDINGS: 1. Normal caliber of the right common femoral artery, adequate for vascular access. 2. Severely calcified plaque in the left subclavian artery extending to the at the origin of the left  vertebral artery, resulting in severe stenosis at the origin of the left vertebral artery (85%), without significant stenosis of the subclavian artery. Delayed flow is noted in the left vertebral artery distal to the stenosis. 3. The intracranial left vertebral artery, basilar artery, and bilateral posterior cerebral arteries are unremarkable. PROCEDURE: Frontal and lateral angiograms of the neck were obtained via left subclavian contrast injection. Using biplane roadmap guidance, a 4.5 X 20 mm synergy balloon mounted stent  was navigated over a 0.014 inch Aristotle guidewire into the proximal left vertebral artery. Frontal and lateral angiograms were obtained to evaluate stent positioning. The stent is at the proximal left vertebral artery with the proximal end at the level of the ostium. A 0.035" Terumo Glidewire was advanced into the distal left subclavian artery for support. The balloon was then inflated to its nominal pressure, expanding the stent within the proximal left vertebral artery. Left subclavian artery angiograms with frontal lateral views of the neck showed adequate stent positioning and expansion with complete resolution of stenosis. Angiograms with frontal and lateral views of the head showed no evidence of thromboembolic complication with improved opacification of the intracranial left vertebral artery, basilar artery and its main branches, and bilateral posterior cerebral arteries. Delayed angiograms showed no evidence of in stent clot formation. The catheter was subsequently withdrawn. Right common femoral artery angiogram was obtained in right anterior oblique view. The puncture is at the level of the common femoral artery. The artery has normal caliber, adequate for closure device. The sheath was exchanged over the wire for a Perclose ProGlide which was utilized for access closure. Immediate hemostasis was achieved. IMPRESSION: Successful and uncomplicated angioplasty and stenting of a severe  left vertebral artery ostial stenosis. PLAN: 1. Transferred to ICU for monitoring for 24 hours. 2. Continue Plavix 75 mg and aspirin 81 mg q.d. Electronically Signed   By: Pedro Earls M.D.   On: 03/24/2022 14:33   IR US Guide Vasc Access Right  Result Date: 03/23/2022 INDICATION: 61 year old male with past medical history significant for diabetes, hypertension, hyperlipidemia, migraines, sleep apnea who presented to the ED for evaluation after an episode of ataxic gait, vertigo and nausea. Symptoms resolved after 20 minutes. CT of the head without contrast showed no acute intracranial abnormality. CT angiogram of the head and neck showed multifocal severe stenosis in bilateral carotid bifurcation and origin of the vertebral arteries. Due to densely calcified plaques, decision was in made to proceed with a diagnostic cerebral angiogram to better evaluate the degree of stenosis. EXAM: BILATERAL COMMON CAROTID AND INNOMINATE ANGIOGRAPHY AND BILATERAL VERTEBRAL ARTERY ANGIOGRAMS MEDICATIONS: No antibiotics administered. ANESTHESIA/SEDATION: Moderate (conscious) sedation was employed during this procedure. A total of Versed 2 mg and Fentanyl 50 mcg was administered intravenously by the radiology nurse. Total intra-service moderate Sedation Time: 43 minutes. The patient's level of consciousness and vital signs were monitored continuously by radiology nursing throughout the procedure under my direct supervision. FLUOROSCOPY: Radiation Exposure Index (as provided by the fluoroscopic device): 696 mGy Kerma COMPLICATIONS: None immediate. TECHNIQUE: Informed written consent was obtained from the patient after a thorough discussion of the procedural risks, benefits and alternatives. All questions were addressed. Maximal Sterile Barrier Technique was utilized including caps, mask, sterile gowns, sterile gloves, sterile drape, hand hygiene and skin antiseptic. A timeout was performed prior to the initiation  of the procedure. The right groin was prepped and draped in the usual sterile fashion. Using a micropuncture kit and the modified Seldinger technique, access was gained to the right common femoral artery and a 5 French sheath was placed. Real-time ultrasound guidance was utilized for vascular access including the acquisition of a permanent ultrasound image documenting patency of the accessed vessel. Under fluoroscopy, a 5 [place] Berenstein 2 catheter was navigated over 0.035" Terumo Glidewire into the aortic arch. The catheter was placed into the left subclavian artery. Frontal, lateral and multiple oblique angiograms of the neck were obtained. Then, frontal and lateral view of  the head was obtained. The catheter was then placed into the right common carotid artery. Frontal, lateral and bilateral oblique angiograms of the neck were obtained. Then, frontal and lateral angiograms of the head were obtained. The catheter was subsequently placed into the right subclavian artery. Frontal and lateral angiograms of the neck were obtained followed by frontal and lateral angiograms of the head. Next, the catheter was placed into the left common carotid artery. Frontal, lateral and bilateral oblique angiograms of the neck were obtained followed by frontal and lateral angiograms of the head. The catheter was subsequently withdrawn. Right common femoral artery angiogram was obtained in right anterior oblique view. The puncture is at the level of the common femoral artery. The artery has normal caliber, adequate for closure device. A 5 [place] Exoseal was utilized for access closure. Immediate hemostasis was achieved. PROCEDURE: No intervention was performed. FINDINGS: Left subclavian angiograms: Severely calcified plaque in the left subclavian artery extending to the at the origin of the left vertebral artery, resulting in severe stenosis at the origin of the left vertebral artery (85%), without significant stenosis of the  subclavian artery. Delayed flow is noted in the left vertebral artery distal to the stenosis. Left vertebral artery angiograms: The intracranial left vertebral artery, basilar artery, and bilateral posterior cerebral arteries are unremarkable. Luminal caliber is smooth and tapering. No aneurysms or abnormally high-flow, early draining veins are seen. No regions of abnormal hypervascularity are noted. The visualized dural sinuses are patent. Right CCA angiograms: Cervical angiograms show prominently calcified plaques along the right carotid bulb resulting in approximately 80% stenosis. Right ICA angiograms: There is brisk vascular contrast filling of the right ACA and MCA vascular trees. There is also brisk contrast opacification of the right PCA vascular tree via a prominent posterior communicating artery. Luminal caliber is smooth and tapering. No aneurysms or abnormally high-flow, early draining veins are seen. No regions of abnormal hypervascularity are noted. The visualized dural sinuses are patent. The visualized branches of the right external carotid artery are unremarkable. Right subclavian angiograms: Atherosclerotic changes at the origin of the right vertebral artery resulting in approximately 70% stenosis. The right subclavian artery have normal caliber. Right vertebral artery angiograms: Mild atherosclerotic changes at the V4 segment of the right vertebral artery without hemodynamically significant stenosis. The basilar artery, and bilateral posterior cerebral arteries are unremarkable. Luminal caliber is smooth and tapering. No aneurysms or abnormally high-flow, early draining veins are seen. No regions of abnormal hypervascularity are noted. The visualized dural sinuses are patent. Left CCA angiograms: Prominently calcified plaques in the left carotid bifurcation resulting in approximately 80% stenosis. Left ICA angiograms: There is brisk vascular contrast filling of the left ACA and MCA vascular trees.  Luminal caliber is smooth and tapering. No aneurysms or abnormally high-flow, early draining veins are seen. No regions of abnormal hypervascularity are noted. The visualized dural sinuses are patent. The visualized branches of the left external carotid artery are unremarkable. Right common femoral artery angiograms: The access is at the level of the mid right common femoral artery. The femoral artery has normal caliber, adequate for closure device. IMPRESSION: 1. Severe atherosclerotic disease at the origin of the left vertebral artery resulting in approximately 85% stenosis. 2. Severe atherosclerotic disease at the right carotid bifurcation resulting approximately 80% stenosis. 3. Severe atherosclerotic disease at the left carotid bifurcation resulting in approximately 80% stenosis. 4. Atherosclerotic disease at the origin of the right vertebral artery resulting in approximately 70% stenosis. Electronically Signed   By:  Pedro Earls M.D.   On: 03/23/2022 14:20   IR ANGIO VERTEBRAL SEL SUBCLAVIAN INNOMINATE BILAT MOD SED  Result Date: 03/23/2022 INDICATION: 61 year old male with past medical history significant for diabetes, hypertension, hyperlipidemia, migraines, sleep apnea who presented to the ED for evaluation after an episode of ataxic gait, vertigo and nausea. Symptoms resolved after 20 minutes. CT of the head without contrast showed no acute intracranial abnormality. CT angiogram of the head and neck showed multifocal severe stenosis in bilateral carotid bifurcation and origin of the vertebral arteries. Due to densely calcified plaques, decision was in made to proceed with a diagnostic cerebral angiogram to better evaluate the degree of stenosis. EXAM: BILATERAL COMMON CAROTID AND INNOMINATE ANGIOGRAPHY AND BILATERAL VERTEBRAL ARTERY ANGIOGRAMS MEDICATIONS: No antibiotics administered. ANESTHESIA/SEDATION: Moderate (conscious) sedation was employed during this procedure. A total of  Versed 2 mg and Fentanyl 50 mcg was administered intravenously by the radiology nurse. Total intra-service moderate Sedation Time: 43 minutes. The patient's level of consciousness and vital signs were monitored continuously by radiology nursing throughout the procedure under my direct supervision. FLUOROSCOPY: Radiation Exposure Index (as provided by the fluoroscopic device): 237 mGy Kerma COMPLICATIONS: None immediate. TECHNIQUE: Informed written consent was obtained from the patient after a thorough discussion of the procedural risks, benefits and alternatives. All questions were addressed. Maximal Sterile Barrier Technique was utilized including caps, mask, sterile gowns, sterile gloves, sterile drape, hand hygiene and skin antiseptic. A timeout was performed prior to the initiation of the procedure. The right groin was prepped and draped in the usual sterile fashion. Using a micropuncture kit and the modified Seldinger technique, access was gained to the right common femoral artery and a 5 French sheath was placed. Real-time ultrasound guidance was utilized for vascular access including the acquisition of a permanent ultrasound image documenting patency of the accessed vessel. Under fluoroscopy, a 5 [place] Berenstein 2 catheter was navigated over 0.035" Terumo Glidewire into the aortic arch. The catheter was placed into the left subclavian artery. Frontal, lateral and multiple oblique angiograms of the neck were obtained. Then, frontal and lateral view of the head was obtained. The catheter was then placed into the right common carotid artery. Frontal, lateral and bilateral oblique angiograms of the neck were obtained. Then, frontal and lateral angiograms of the head were obtained. The catheter was subsequently placed into the right subclavian artery. Frontal and lateral angiograms of the neck were obtained followed by frontal and lateral angiograms of the head. Next, the catheter was placed into the left common  carotid artery. Frontal, lateral and bilateral oblique angiograms of the neck were obtained followed by frontal and lateral angiograms of the head. The catheter was subsequently withdrawn. Right common femoral artery angiogram was obtained in right anterior oblique view. The puncture is at the level of the common femoral artery. The artery has normal caliber, adequate for closure device. A 5 [place] Exoseal was utilized for access closure. Immediate hemostasis was achieved. PROCEDURE: No intervention was performed. FINDINGS: Left subclavian angiograms: Severely calcified plaque in the left subclavian artery extending to the at the origin of the left vertebral artery, resulting in severe stenosis at the origin of the left vertebral artery (85%), without significant stenosis of the subclavian artery. Delayed flow is noted in the left vertebral artery distal to the stenosis. Left vertebral artery angiograms: The intracranial left vertebral artery, basilar artery, and bilateral posterior cerebral arteries are unremarkable. Luminal caliber is smooth and tapering. No aneurysms or abnormally high-flow, early draining  veins are seen. No regions of abnormal hypervascularity are noted. The visualized dural sinuses are patent. Right CCA angiograms: Cervical angiograms show prominently calcified plaques along the right carotid bulb resulting in approximately 80% stenosis. Right ICA angiograms: There is brisk vascular contrast filling of the right ACA and MCA vascular trees. There is also brisk contrast opacification of the right PCA vascular tree via a prominent posterior communicating artery. Luminal caliber is smooth and tapering. No aneurysms or abnormally high-flow, early draining veins are seen. No regions of abnormal hypervascularity are noted. The visualized dural sinuses are patent. The visualized branches of the right external carotid artery are unremarkable. Right subclavian angiograms: Atherosclerotic changes at the  origin of the right vertebral artery resulting in approximately 70% stenosis. The right subclavian artery have normal caliber. Right vertebral artery angiograms: Mild atherosclerotic changes at the V4 segment of the right vertebral artery without hemodynamically significant stenosis. The basilar artery, and bilateral posterior cerebral arteries are unremarkable. Luminal caliber is smooth and tapering. No aneurysms or abnormally high-flow, early draining veins are seen. No regions of abnormal hypervascularity are noted. The visualized dural sinuses are patent. Left CCA angiograms: Prominently calcified plaques in the left carotid bifurcation resulting in approximately 80% stenosis. Left ICA angiograms: There is brisk vascular contrast filling of the left ACA and MCA vascular trees. Luminal caliber is smooth and tapering. No aneurysms or abnormally high-flow, early draining veins are seen. No regions of abnormal hypervascularity are noted. The visualized dural sinuses are patent. The visualized branches of the left external carotid artery are unremarkable. Right common femoral artery angiograms: The access is at the level of the mid right common femoral artery. The femoral artery has normal caliber, adequate for closure device. IMPRESSION: 1. Severe atherosclerotic disease at the origin of the left vertebral artery resulting in approximately 85% stenosis. 2. Severe atherosclerotic disease at the right carotid bifurcation resulting approximately 80% stenosis. 3. Severe atherosclerotic disease at the left carotid bifurcation resulting in approximately 80% stenosis. 4. Atherosclerotic disease at the origin of the right vertebral artery resulting in approximately 70% stenosis. Electronically Signed   By: Pedro Earls M.D.   On: 03/23/2022 14:20   IR ANGIO INTRA EXTRACRAN SEL COM CAROTID INNOMINATE BILAT MOD SED  Result Date: 03/23/2022 INDICATION: 61 year old male with past medical history significant  for diabetes, hypertension, hyperlipidemia, migraines, sleep apnea who presented to the ED for evaluation after an episode of ataxic gait, vertigo and nausea. Symptoms resolved after 20 minutes. CT of the head without contrast showed no acute intracranial abnormality. CT angiogram of the head and neck showed multifocal severe stenosis in bilateral carotid bifurcation and origin of the vertebral arteries. Due to densely calcified plaques, decision was in made to proceed with a diagnostic cerebral angiogram to better evaluate the degree of stenosis. EXAM: BILATERAL COMMON CAROTID AND INNOMINATE ANGIOGRAPHY AND BILATERAL VERTEBRAL ARTERY ANGIOGRAMS MEDICATIONS: No antibiotics administered. ANESTHESIA/SEDATION: Moderate (conscious) sedation was employed during this procedure. A total of Versed 2 mg and Fentanyl 50 mcg was administered intravenously by the radiology nurse. Total intra-service moderate Sedation Time: 43 minutes. The patient's level of consciousness and vital signs were monitored continuously by radiology nursing throughout the procedure under my direct supervision. FLUOROSCOPY: Radiation Exposure Index (as provided by the fluoroscopic device): 831 mGy Kerma COMPLICATIONS: None immediate. TECHNIQUE: Informed written consent was obtained from the patient after a thorough discussion of the procedural risks, benefits and alternatives. All questions were addressed. Maximal Sterile Barrier Technique was utilized including caps,  mask, sterile gowns, sterile gloves, sterile drape, hand hygiene and skin antiseptic. A timeout was performed prior to the initiation of the procedure. The right groin was prepped and draped in the usual sterile fashion. Using a micropuncture kit and the modified Seldinger technique, access was gained to the right common femoral artery and a 5 French sheath was placed. Real-time ultrasound guidance was utilized for vascular access including the acquisition of a permanent ultrasound image  documenting patency of the accessed vessel. Under fluoroscopy, a 5 [place] Berenstein 2 catheter was navigated over 0.035" Terumo Glidewire into the aortic arch. The catheter was placed into the left subclavian artery. Frontal, lateral and multiple oblique angiograms of the neck were obtained. Then, frontal and lateral view of the head was obtained. The catheter was then placed into the right common carotid artery. Frontal, lateral and bilateral oblique angiograms of the neck were obtained. Then, frontal and lateral angiograms of the head were obtained. The catheter was subsequently placed into the right subclavian artery. Frontal and lateral angiograms of the neck were obtained followed by frontal and lateral angiograms of the head. Next, the catheter was placed into the left common carotid artery. Frontal, lateral and bilateral oblique angiograms of the neck were obtained followed by frontal and lateral angiograms of the head. The catheter was subsequently withdrawn. Right common femoral artery angiogram was obtained in right anterior oblique view. The puncture is at the level of the common femoral artery. The artery has normal caliber, adequate for closure device. A 5 [place] Exoseal was utilized for access closure. Immediate hemostasis was achieved. PROCEDURE: No intervention was performed. FINDINGS: Left subclavian angiograms: Severely calcified plaque in the left subclavian artery extending to the at the origin of the left vertebral artery, resulting in severe stenosis at the origin of the left vertebral artery (85%), without significant stenosis of the subclavian artery. Delayed flow is noted in the left vertebral artery distal to the stenosis. Left vertebral artery angiograms: The intracranial left vertebral artery, basilar artery, and bilateral posterior cerebral arteries are unremarkable. Luminal caliber is smooth and tapering. No aneurysms or abnormally high-flow, early draining veins are seen. No regions  of abnormal hypervascularity are noted. The visualized dural sinuses are patent. Right CCA angiograms: Cervical angiograms show prominently calcified plaques along the right carotid bulb resulting in approximately 80% stenosis. Right ICA angiograms: There is brisk vascular contrast filling of the right ACA and MCA vascular trees. There is also brisk contrast opacification of the right PCA vascular tree via a prominent posterior communicating artery. Luminal caliber is smooth and tapering. No aneurysms or abnormally high-flow, early draining veins are seen. No regions of abnormal hypervascularity are noted. The visualized dural sinuses are patent. The visualized branches of the right external carotid artery are unremarkable. Right subclavian angiograms: Atherosclerotic changes at the origin of the right vertebral artery resulting in approximately 70% stenosis. The right subclavian artery have normal caliber. Right vertebral artery angiograms: Mild atherosclerotic changes at the V4 segment of the right vertebral artery without hemodynamically significant stenosis. The basilar artery, and bilateral posterior cerebral arteries are unremarkable. Luminal caliber is smooth and tapering. No aneurysms or abnormally high-flow, early draining veins are seen. No regions of abnormal hypervascularity are noted. The visualized dural sinuses are patent. Left CCA angiograms: Prominently calcified plaques in the left carotid bifurcation resulting in approximately 80% stenosis. Left ICA angiograms: There is brisk vascular contrast filling of the left ACA and MCA vascular trees. Luminal caliber is smooth and tapering. No aneurysms  or abnormally high-flow, early draining veins are seen. No regions of abnormal hypervascularity are noted. The visualized dural sinuses are patent. The visualized branches of the left external carotid artery are unremarkable. Right common femoral artery angiograms: The access is at the level of the mid right  common femoral artery. The femoral artery has normal caliber, adequate for closure device. IMPRESSION: 1. Severe atherosclerotic disease at the origin of the left vertebral artery resulting in approximately 85% stenosis. 2. Severe atherosclerotic disease at the right carotid bifurcation resulting approximately 80% stenosis. 3. Severe atherosclerotic disease at the left carotid bifurcation resulting in approximately 80% stenosis. 4. Atherosclerotic disease at the origin of the right vertebral artery resulting in approximately 70% stenosis. Electronically Signed   By: Pedro Earls M.D.   On: 03/23/2022 14:20   DG CHEST PORT 1 VIEW  Result Date: 03/22/2022 CLINICAL DATA:  Recent TIA EXAM: PORTABLE CHEST 1 VIEW COMPARISON:  10/17/2006 FINDINGS: The heart size and mediastinal contours are within normal limits. Both lungs are clear. The visualized skeletal structures are unremarkable. IMPRESSION: No active disease. Electronically Signed   By: Inez Catalina M.D.   On: 03/22/2022 22:38   MR BRAIN WO CONTRAST  Result Date: 03/22/2022 CLINICAL DATA:  61 year old male TIA. Dizziness. Unsteady gait. Neurologic deficit. EXAM: MRI HEAD WITHOUT CONTRAST TECHNIQUE: Multiplanar, multiecho pulse sequences of the brain and surrounding structures were obtained without intravenous contrast. COMPARISON:  CTA head and neck earlier today. FINDINGS: Brain: No restricted diffusion to suggest acute infarction. No midline shift, mass effect, evidence of mass lesion, ventriculomegaly, extra-axial collection or acute intracranial hemorrhage. Cervicomedullary junction and pituitary are within normal limits. Scattered small subcortical white matter T2 and FLAIR hyperintense foci, mild to moderate for age. Occasional periventricular involvement. No cortical encephalomalacia or chronic cerebral blood products identified. Deep gray nuclei, brainstem, and cerebellum are normal for age. Vascular: Major intracranial vascular flow  voids are preserved. Skull and upper cervical spine: Negative. Visualized bone marrow signal is within normal limits. Sinuses/Orbits: Negative. Other: Mastoids are clear. Grossly negative visible internal auditory structures. Normal stylomastoid foramina. Negative visible scalp and face. IMPRESSION: 1. No acute intracranial abnormality. 2. Mild to moderate for age nonspecific cerebral white matter signal changes, most commonly due to chronic small vessel disease. Electronically Signed   By: Genevie Ann M.D.   On: 03/22/2022 09:19   CT ANGIO HEAD NECK W WO CM  Result Date: 03/22/2022 CLINICAL DATA:  61 year old male TIA.  Dizziness. EXAM: CT ANGIOGRAPHY HEAD AND NECK TECHNIQUE: Multidetector CT imaging of the head and neck was performed using the standard protocol during bolus administration of intravenous contrast. Multiplanar CT image reconstructions and MIPs were obtained to evaluate the vascular anatomy. Carotid stenosis measurements (when applicable) are obtained utilizing NASCET criteria, using the distal internal carotid diameter as the denominator. RADIATION DOSE REDUCTION: This exam was performed according to the departmental dose-optimization program which includes automated exposure control, adjustment of the mA and/or kV according to patient size and/or use of iterative reconstruction technique. CONTRAST:  15m OMNIPAQUE IOHEXOL 350 MG/ML SOLN COMPARISON:  Plain head CT 03/21/2022. FINDINGS: CTA NECK Skeleton: No acute osseous abnormality identified. Tympanic cavities and Visualized paranasal sinuses and mastoids are clear. Upper chest: Negative. Other neck: Motion artifact at the soft palate, oropharynx. No acute neck soft tissue finding identified. Aortic arch: Mild arch atherosclerosis. Three vessel arch configuration. Right carotid system: Minimal brachiocephalic artery origin plaque without stenosis. Normal right CCA in total lateral calcified plaque which is just  proximal to the bifurcation not  resulting in stenosis (series 4, image 115). Patent carotid bifurcation. Bulky calcified plaque at the right ICA origin and bulb. Radiographic string sign stenosis results as seen on series 6 images 207 through 201, along a segment of up to 16 mm. Right ICA remains patent and is otherwise negative to the skull base. Left carotid system: Mild left CCA origin plaque without stenosis. Bulky calcified plaque beginning at the left carotid bifurcation and continuing into the left ICA origin and bulb with high-grade stenosis just before the left ICA origin on series 6, image 217 approaching a radiographic string sign. Origin and bulb calcified plaque then results in less than 50 % stenosis with respect to the distal vessel. Vertebral arteries: Calcified plaque at the right subclavian artery origin without stenosis. Calcified plaque near the right vertebral artery origin,. Additional right V1 (series 6, images 288 and 273) with at least moderate V1 segment stenosis. But the right vertebral artery remains patent to the skull base with no additional plaque. Proximal left subclavian artery calcified plaque with no significant stenosis but bulky and long segment nearly 2.5 cm calcified plaque throughout the proximal left vertebral artery (series 7, image 128) with high-grade stenosis. However, the left vertebral is mildly dominant and remains patent. No additional plaque or stenosis to the skull base. CTA HEAD Posterior circulation: Mildly dominant left vertebral V4 segment with normal left PICA origin and vertebrobasilar junction. No distal left vertebral plaque or stenosis. Contralateral right vertebral V4 segment moderate calcified plaque and stenosis on series 6, image 147. But the right vertebral remains patent to the vertebrobasilar junction. The right AICA appears dominant. Patent basilar artery with mild irregularity. No significant basilar stenosis. Patent SCA and PCA origins. Right posterior communicating artery is  present. Bilateral PCA branches are patent with mild irregularity. Anterior circulation: Both ICA siphons are patent. Only mild supraclinoid ICA calcified plaque. No significant ICA siphon stenosis. Patent carotid termini. Patent MCA and ACA origins. Normal anterior communicating artery. Bilateral ACA branches are within normal limits. Left MCA M1 segment and bifurcation are patent without stenosis. Left MCA branches appear mildly irregular. Right MCA M1 segment is tortuous and bifurcates early without stenosis. Right MCA branches appear patent with mild irregularity. Venous sinuses: Early contrast timing, grossly patent. Anatomic variants: Mildly dominant left vertebral artery. Review of the MIP images confirms the above findings IMPRESSION: 1. Negative for large vessel occlusion, but positive for bulky calcified plaque at both carotid bifurcations and the proximal left vertebral artery: - Right ICA origin and bulb RADIOGRAPHIC STRING SIGN. - proximal Left Vertebral Artery RADIOGRAPHIC STRING SIGN. - Left Carotid Bifurcation High-grade Stenosis, approaching a radiographic string sign. 2. Also moderate Right Vertebral Artery V1 and V4 segment stenoses due to plaque. 3. Aortic Atherosclerosis (ICD10-I70.0). Electronically Signed   By: Genevie Ann M.D.   On: 03/22/2022 05:12   CT HEAD WO CONTRAST  Result Date: 03/21/2022 CLINICAL DATA:  Dizziness. EXAM: CT HEAD WITHOUT CONTRAST TECHNIQUE: Contiguous axial images were obtained from the base of the skull through the vertex without intravenous contrast. RADIATION DOSE REDUCTION: This exam was performed according to the departmental dose-optimization program which includes automated exposure control, adjustment of the mA and/or kV according to patient size and/or use of iterative reconstruction technique. COMPARISON:  None Available. FINDINGS: Brain: No evidence of acute infarction, hemorrhage, hydrocephalus, extra-axial collection or mass lesion/mass effect. Vascular: No  hyperdense vessel or unexpected calcification. Skull: Normal. Negative for fracture or focal lesion. Sinuses/Orbits: There  is mild right ethmoid sinus mucosal thickening. Other: None. IMPRESSION: No acute intracranial abnormality. Electronically Signed   By: Virgina Norfolk M.D.   On: 03/21/2022 19:58    Labs:  CBC: Recent Labs    03/21/22 1929 03/22/22 2220 03/23/22 0240 04/17/22 0709  WBC 8.2 7.0 8.1 7.4  HGB 14.5 14.7 14.2 14.9  HCT 41.1 40.1 38.9* 41.9  PLT 204 209 194 222    COAGS: Recent Labs    03/21/22 1929 04/17/22 0709  INR 1.1 1.1  APTT 28  --     BMP: Recent Labs    03/21/22 1929 03/22/22 2220 03/23/22 0240 04/17/22 0709  NA 131* 129* 130* 132*  K 4.2 4.0 3.9 4.0  CL 98 98 100 99  CO2 '24 23 22 23  ' GLUCOSE 153* 171* 100* 129*  BUN '15 11 10 18  ' CALCIUM 8.8* 8.5* 8.5* 8.7*  CREATININE 1.02 1.03 0.99 1.14  GFRNONAA >60 >60 >60 >60    LIVER FUNCTION TESTS: Recent Labs    11/01/21 1143 03/21/22 1929 03/22/22 2220 03/23/22 0240  BILITOT 0.7 1.8* 1.8* 1.5*  AST 40 36 33 30  ALT 48* 41 45* 40  ALKPHOS 77 69 60 57  PROT 6.7 7.4 6.6 6.3*  ALBUMIN 4.6 4.3 3.6 3.4*    TUMOR MARKERS: No results for input(s): "AFPTM", "CEA", "CA199", "CHROMGRNA" in the last 8760 hours.  Assessment and Plan: 61 yo male well-known to IR service having previous left vertebral stent placement presents for left carotid stent placement.   Pt resting in bed with wife at bedside.  He is A&O, calm and pleasant.  He is in no distress.  Pt states he is NPO per order.  He took 75 mg Plavix and 81 mg ASA PTA.  Risks and benefits of left carotid arteriogram with intervention were discussed with the patient including, but not limited to bleeding, infection, vascular injury, contrast induced renal failure, stroke, reperfusion hemorrhage, or even death. This interventional procedure involves the use of X-rays and because of the nature of the planned procedure, it is possible that  we will have prolonged use of X-ray fluoroscopy. Potential radiation risks to you include (but are not limited to) the following: - A slightly elevated risk for cancer  several years later in life. This risk is typically less than 0.5% percent. This risk is low in comparison to the normal incidence of human cancer, which is 33% for women and 50% for men according to the [place]. - Radiation induced injury can include skin redness, resembling a rash, tissue breakdown / ulcers and hair loss (which can be temporary or permanent).  The likelihood of either of these occurring depends on the difficulty of the procedure and whether you are sensitive to radiation due to previous procedures, disease, or genetic conditions.  IF your procedure requires a prolonged use of radiation, you will be notified and given written instructions for further action.  It is your responsibility to monitor the irradiated area for the 2 weeks following the procedure and to notify your physician if you are concerned that you have suffered a radiation induced injury.   All of the patient's questions were answered, patient is agreeable to proceed. Consent signed and in chart.   Thank you for this interesting consult.  I greatly enjoyed meeting OLAMIDE CARATTINI and look forward to participating in their care.  A copy of this report was sent to the requesting provider on this date.  Electronically Signed: Manuela Schwartz  Dennison Mascot, NP 04/17/2022, 8:28 AM   I spent a total of 20 minutes in face to face in clinical consultation, greater than 50% of which was counseling/coordinating care for left carotid stenosis.

## 2022-04-14 ENCOUNTER — Encounter: Payer: Self-pay | Admitting: Internal Medicine

## 2022-04-14 ENCOUNTER — Encounter (HOSPITAL_COMMUNITY): Payer: Self-pay | Admitting: *Deleted

## 2022-04-14 ENCOUNTER — Other Ambulatory Visit: Payer: Self-pay

## 2022-04-14 NOTE — Progress Notes (Signed)
Mr. Calvin Smith denies chest pain or shortness of breath.  Patient denies having any s/s of Covid in her household, also denies any known exposure to Covid.   [name]  PCP is Dr. Wilhemena Durie.  Mr. Calvin Smith has type II diabetes, patient checks daily fasting, CBGs run around 100.  I instructed Mr. Calvin Smith on Saturday, instructions will be given when you may take it again. I instructed Mr. Calvin Smith not take the following medications for diabetes Sunday or Monday: Jardiance, Glipizide, Metformin.  I instructed Mr. Calvin Smith to check CBG after awaking and every 2 hours until arrival  to the hospital. I Instructed patient if CBG is less than 70 to take 4 Glucose Tablets or 1 tube of Glucose Gel or 1/2 cup of a clear juice. Recheck CBG in 15 minutes if CBG is not over 70 call, pre- op desk at [phone] for further instructions. If scheduled to receive Insulin, do not take Insulin

## 2022-04-16 ENCOUNTER — Other Ambulatory Visit: Payer: Self-pay | Admitting: Student

## 2022-04-17 ENCOUNTER — Other Ambulatory Visit: Payer: Self-pay

## 2022-04-17 ENCOUNTER — Ambulatory Visit (HOSPITAL_COMMUNITY)
Admission: RE | Admit: 2022-04-17 | Discharge: 2022-04-17 | Disposition: A | Discharge status: Home / Self Care | Payer: 59 | Source: Ambulatory Visit | Attending: Neuroradiology | Admitting: Neuroradiology

## 2022-04-17 ENCOUNTER — Observation Stay (HOSPITAL_COMMUNITY)
Admission: RE | Admit: 2022-04-17 | Discharge: 2022-04-18 | Disposition: A | Discharge status: Home / Self Care | Payer: 59 | Attending: Neuroradiology | Admitting: Neuroradiology

## 2022-04-17 ENCOUNTER — Ambulatory Visit (HOSPITAL_COMMUNITY): Payer: 59 | Admitting: Physician Assistant

## 2022-04-17 ENCOUNTER — Encounter (HOSPITAL_COMMUNITY): Payer: Self-pay

## 2022-04-17 ENCOUNTER — Encounter (HOSPITAL_COMMUNITY)
Admission: RE | Disposition: A | Discharge status: Home / Self Care | Payer: Self-pay | Source: Home / Self Care | Attending: Neuroradiology

## 2022-04-17 ENCOUNTER — Ambulatory Visit (HOSPITAL_BASED_OUTPATIENT_CLINIC_OR_DEPARTMENT_OTHER): Payer: 59 | Admitting: Physician Assistant

## 2022-04-17 DIAGNOSIS — Z794 Long term (current) use of insulin: Secondary | ICD-10-CM | POA: Diagnosis not present

## 2022-04-17 DIAGNOSIS — I6522 Occlusion and stenosis of left carotid artery: Principal | ICD-10-CM | POA: Diagnosis present

## 2022-04-17 DIAGNOSIS — E1151 Type 2 diabetes mellitus with diabetic peripheral angiopathy without gangrene: Secondary | ICD-10-CM | POA: Insufficient documentation

## 2022-04-17 DIAGNOSIS — I10 Essential (primary) hypertension: Secondary | ICD-10-CM | POA: Insufficient documentation

## 2022-04-17 DIAGNOSIS — I6523 Occlusion and stenosis of bilateral carotid arteries: Secondary | ICD-10-CM

## 2022-04-17 DIAGNOSIS — Z7982 Long term (current) use of aspirin: Secondary | ICD-10-CM | POA: Insufficient documentation

## 2022-04-17 DIAGNOSIS — Z7984 Long term (current) use of oral hypoglycemic drugs: Secondary | ICD-10-CM | POA: Diagnosis not present

## 2022-04-17 DIAGNOSIS — Z79899 Other long term (current) drug therapy: Secondary | ICD-10-CM | POA: Insufficient documentation

## 2022-04-17 DIAGNOSIS — I771 Stricture of artery: Secondary | ICD-10-CM

## 2022-04-17 DIAGNOSIS — Z7902 Long term (current) use of antithrombotics/antiplatelets: Secondary | ICD-10-CM | POA: Diagnosis not present

## 2022-04-17 DIAGNOSIS — K219 Gastro-esophageal reflux disease without esophagitis: Secondary | ICD-10-CM | POA: Diagnosis not present

## 2022-04-17 DIAGNOSIS — G4730 Sleep apnea, unspecified: Secondary | ICD-10-CM | POA: Insufficient documentation

## 2022-04-17 DIAGNOSIS — E1159 Type 2 diabetes mellitus with other circulatory complications: Secondary | ICD-10-CM

## 2022-04-17 DIAGNOSIS — I63211 Cerebral infarction due to unspecified occlusion or stenosis of right vertebral arteries: Principal | ICD-10-CM

## 2022-04-17 HISTORY — DX: Peripheral vascular disease, unspecified: I73.9

## 2022-04-17 HISTORY — PX: IR ANGIO INTRA EXTRACRAN SEL COM CAROTID INNOMINATE UNI L MOD SED: IMG5358

## 2022-04-17 HISTORY — DX: Gastro-esophageal reflux disease without esophagitis: K21.9

## 2022-04-17 HISTORY — PX: RADIOLOGY WITH ANESTHESIA: SHX6223

## 2022-04-17 HISTORY — PX: IR US GUIDE VASC ACCESS RIGHT: IMG2390

## 2022-04-17 HISTORY — PX: IR ANGIO VERTEBRAL SEL SUBCLAVIAN INNOMINATE UNI L MOD SED: IMG5364

## 2022-04-17 HISTORY — PX: IR INTRAVSC STENT CERV CAROTID W/EMB-PROT MOD SED INCL ANGIO: IMG2303

## 2022-04-17 LAB — CBC WITH DIFFERENTIAL/PLATELET
Abs Immature Granulocytes: 0.05 10*3/uL (ref 0.00–0.07)
Basophils Absolute: 0 10*3/uL (ref 0.0–0.1)
Basophils Relative: 0 %
Eosinophils Absolute: 0.1 10*3/uL (ref 0.0–0.5)
Eosinophils Relative: 2 %
HCT: 41.9 % (ref 39.0–52.0)
Hemoglobin: 14.9 g/dL (ref 13.0–17.0)
Immature Granulocytes: 1 %
Lymphocytes Relative: 25 %
Lymphs Abs: 1.9 10*3/uL (ref 0.7–4.0)
MCH: 33.1 pg (ref 26.0–34.0)
MCHC: 35.6 g/dL (ref 30.0–36.0)
MCV: 93.1 fL (ref 80.0–100.0)
Monocytes Absolute: 0.7 10*3/uL (ref 0.1–1.0)
Monocytes Relative: 9 %
Neutro Abs: 4.6 10*3/uL (ref 1.7–7.7)
Neutrophils Relative %: 63 %
Platelets: 222 10*3/uL (ref 150–400)
RBC: 4.5 MIL/uL (ref 4.22–5.81)
RDW: 12.7 % (ref 11.5–15.5)
WBC: 7.4 10*3/uL (ref 4.0–10.5)
nRBC: 0 % (ref 0.0–0.2)

## 2022-04-17 LAB — GLUCOSE, CAPILLARY
Glucose-Capillary: 133 mg/dL — ABNORMAL HIGH (ref 70–99)
Glucose-Capillary: 127 mg/dL — ABNORMAL HIGH (ref 70–99)
Glucose-Capillary: 97 mg/dL (ref 70–99)

## 2022-04-17 LAB — BASIC METABOLIC PANEL
Anion gap: 10 (ref 5–15)
BUN: 18 mg/dL (ref 8–23)
CO2: 23 mmol/L (ref 22–32)
Calcium: 8.7 mg/dL — ABNORMAL LOW (ref 8.9–10.3)
Chloride: 99 mmol/L (ref 98–111)
Creatinine, Ser: 1.14 mg/dL (ref 0.61–1.24)
GFR, Estimated: >60 mL/min (ref >60)
Glucose, Bld: 129 mg/dL — ABNORMAL HIGH (ref 70–99)
Potassium: 4 mmol/L (ref 3.5–5.1)
Sodium: 132 mmol/L — ABNORMAL LOW (ref 135–145)

## 2022-04-17 LAB — PROTIME-INR
INR: 1.1 (ref 0.8–1.2)
Prothrombin Time: 14 seconds (ref 11.4–15.2)

## 2022-04-17 SURGERY — IR WITH ANESTHESIA
Anesthesia: Monitor Anesthesia Care

## 2022-04-17 MED ORDER — ASPIRIN 325 MG PO TBEC
325.0000 mg | DELAYED_RELEASE_TABLET | ORAL | Status: DC
  Filled 2022-04-17: qty 1

## 2022-04-17 MED ORDER — ONDANSETRON HCL 4 MG/2ML IJ SOLN: 4.0000 mg | Freq: Four times a day (QID) | INTRAMUSCULAR | Status: DC | PRN

## 2022-04-17 MED ORDER — INSULIN ASPART 100 UNIT/ML IJ SOLN: 0.0000 [IU] | INTRAMUSCULAR | Status: DC | PRN

## 2022-04-17 MED ORDER — ACETAMINOPHEN 325 MG PO TABS
650.0000 mg | ORAL_TABLET | ORAL | Status: DC | PRN
  Administered 2022-04-17 – 2022-04-18 (×3): 650 mg via ORAL
  Filled 2022-04-17 (×3): qty 2

## 2022-04-17 MED ORDER — CHLORHEXIDINE GLUCONATE 0.12 % MT SOLN
15.0000 mL | Freq: Once | OROMUCOSAL | Status: AC
  Administered 2022-04-17: 15 mL via OROMUCOSAL
  Filled 2022-04-17: qty 15

## 2022-04-17 MED ORDER — ACETAMINOPHEN 160 MG/5ML PO SOLN: 650.0000 mg | ORAL | Status: DC | PRN

## 2022-04-17 MED ORDER — ASPIRIN 81 MG PO CHEW
81.0000 mg | CHEWABLE_TABLET | Freq: Once | ORAL | Status: DC
Start: 2022-04-17 — End: 2022-04-17

## 2022-04-17 MED ORDER — ASPIRIN 81 MG PO CHEW: 243.0000 mg | CHEWABLE_TABLET | Freq: Once | ORAL | Status: AC

## 2022-04-17 MED ORDER — CLOPIDOGREL BISULFATE 75 MG PO TABS
75.0000 mg | ORAL_TABLET | ORAL | Status: DC
  Filled 2022-04-17: qty 1

## 2022-04-17 MED ORDER — CLEVIDIPINE BUTYRATE 0.5 MG/ML IV EMUL: 0.0000 mg/h | INTRAVENOUS | Status: DC

## 2022-04-17 MED ORDER — HEPARIN SODIUM (PORCINE) 1000 UNIT/ML IJ SOLN
INTRAMUSCULAR | Status: DC | PRN
  Administered 2022-04-17: 5000 [IU] via INTRAVENOUS

## 2022-04-17 MED ORDER — ONDANSETRON HCL 4 MG/2ML IJ SOLN
INTRAMUSCULAR | Status: DC | PRN
  Administered 2022-04-17: 4 mg via INTRAVENOUS

## 2022-04-17 MED ORDER — ORAL CARE MOUTH RINSE: 15.0000 mL | Freq: Once | OROMUCOSAL | Status: AC

## 2022-04-17 MED ORDER — CEFAZOLIN SODIUM-DEXTROSE 2-4 GM/100ML-% IV SOLN
2.0000 g | INTRAVENOUS | Status: AC
  Administered 2022-04-17: 2 g via INTRAVENOUS
  Filled 2022-04-17 (×2): qty 100

## 2022-04-17 MED ORDER — SODIUM CHLORIDE 0.9 % IV SOLN: INTRAVENOUS | Status: DC

## 2022-04-17 MED ORDER — IOHEXOL 300 MG/ML  SOLN
100.0000 mL | Freq: Once | INTRAMUSCULAR | Status: AC | PRN
  Administered 2022-04-17: 35 mL via INTRA_ARTERIAL

## 2022-04-17 MED ORDER — MIDAZOLAM HCL 2 MG/2ML IJ SOLN
INTRAMUSCULAR | Status: DC | PRN
  Administered 2022-04-17: 2 mg via INTRAVENOUS

## 2022-04-17 MED ORDER — LIDOCAINE HCL 1 % IJ SOLN
INTRAMUSCULAR | Status: AC
  Filled 2022-04-17: qty 20

## 2022-04-17 MED ORDER — ORAL CARE MOUTH RINSE: 15.0000 mL | OROMUCOSAL | Status: DC | PRN

## 2022-04-17 MED ORDER — INSULIN ASPART 100 UNIT/ML IJ SOLN: 0.0000 [IU] | INTRAMUSCULAR | Status: DC

## 2022-04-17 MED ORDER — FENTANYL CITRATE (PF) 250 MCG/5ML IJ SOLN
INTRAMUSCULAR | Status: DC | PRN
  Administered 2022-04-17 (×2): 50 ug via INTRAVENOUS

## 2022-04-17 MED ORDER — ACETAMINOPHEN 650 MG RE SUPP: 650.0000 mg | RECTAL | Status: DC | PRN

## 2022-04-17 MED ORDER — LACTATED RINGERS IV SOLN
INTRAVENOUS | Status: DC
Start: 2022-04-17 — End: 2022-04-17

## 2022-04-17 MED ORDER — PHENYLEPHRINE HCL-NACL 20-0.9 MG/250ML-% IV SOLN
0.0000 ug/min | INTRAVENOUS | Status: DC
  Administered 2022-04-17: 20 ug/min via INTRAVENOUS
  Filled 2022-04-17: qty 250

## 2022-04-17 MED ORDER — PHENYLEPHRINE 80 MCG/ML (10ML) SYRINGE FOR IV PUSH (FOR BLOOD PRESSURE SUPPORT)
PREFILLED_SYRINGE | INTRAVENOUS | Status: DC | PRN
  Administered 2022-04-17 (×3): 80 ug via INTRAVENOUS

## 2022-04-17 MED ORDER — LACTATED RINGERS IV SOLN: INTRAVENOUS | Status: DC | PRN

## 2022-04-17 MED ORDER — GLYCOPYRROLATE PF 0.2 MG/ML IJ SOSY
PREFILLED_SYRINGE | INTRAMUSCULAR | Status: DC | PRN
  Administered 2022-04-17: .1 mg via INTRAVENOUS

## 2022-04-17 MED ORDER — MIDAZOLAM HCL 2 MG/2ML IJ SOLN
INTRAMUSCULAR | Status: AC
  Filled 2022-04-17: qty 2

## 2022-04-17 MED ORDER — FENTANYL CITRATE (PF) 100 MCG/2ML IJ SOLN
INTRAMUSCULAR | Status: AC
  Filled 2022-04-17: qty 2

## 2022-04-17 MED ORDER — ASPIRIN 81 MG PO CHEW
CHEWABLE_TABLET | ORAL | Status: AC
  Administered 2022-04-17: 243 mg via ORAL
  Filled 2022-04-17: qty 3

## 2022-04-17 MED ORDER — CHLORHEXIDINE GLUCONATE CLOTH 2 % EX PADS: 6.0000 | MEDICATED_PAD | Freq: Every day | CUTANEOUS | Status: DC

## 2022-04-17 MED ORDER — NIMODIPINE 30 MG PO CAPS
0.0000 mg | ORAL_CAPSULE | ORAL | Status: DC
  Filled 2022-04-17: qty 2

## 2022-04-17 NOTE — Procedures (Signed)
INTERVENTIONAL NEURORADIOLOGY BRIEF POSTPROCEDURE NOTE  ULTRASOUND-GUIDED VASCULAR ACCESS  DIAGNOSTIC CEREBRAL ANGIOGRAM  LEFT CAROTID ANGIOPLASTY AND STENTING WITH CEREBRAL PROTECTION DEVICE    Attending: Dr. Pedro Earls   Diagnosis: Left carotid stenosis    Access site: Right common femoral artery    Access closure: Perclose pro style    Anesthesia: Monitored anesthesia care    Medication used: Refer to anesthesia documentation.   Complications: None.    Estimated blood loss: 30 mL    Specimen: None.    Findings: Redemonstrated high-grade stenosis (~80%) at the left carotid bifurcation.  Angioplasty performed with a 4 mm balloon followed by placement of an XACT carotid stent across the area of stenosis followed by in stent angioplasty with a 5 mm balloon with the use of a cerebral protection device.  No evidence of thromboembolic complication.    The patient tolerated the procedure well without incident or complication and is in stable condition.   PLAN: - bed rest x6 hours post femoral access  - SBP 100 x 140 mmHg

## 2022-04-17 NOTE — Anesthesia Preprocedure Evaluation (Addendum)
Anesthesia Evaluation  Patient identified by MRN, date of birth, ID band Patient awake    Reviewed: Allergy & Precautions, NPO status , Patient's Chart, lab work & pertinent test results  Airway Mallampati: III  TM Distance: >3 FB Neck ROM: Full    Dental  (+) Teeth Intact, Dental Advisory Given   Pulmonary sleep apnea ,    breath sounds clear to auscultation       Cardiovascular hypertension, Pt. on medications + Peripheral Vascular Disease   Rhythm:Regular Rate:Normal     Neuro/Psych  Headaches, TIA   GI/Hepatic Neg liver ROS, GERD  Medicated,  Endo/Other  diabetes, Type 2, Oral Hypoglycemic Agents  Renal/GU negative Renal ROS     Musculoskeletal negative musculoskeletal ROS (+)   Abdominal   Peds  Hematology negative hematology ROS (+)   Anesthesia Other Findings   Reproductive/Obstetrics                            Anesthesia Physical Anesthesia Plan  ASA: 3  Anesthesia Plan: MAC   Post-op Pain Management:    Induction: Intravenous  PONV Risk Score and Plan: 3 and Ondansetron, Propofol infusion and Midazolam  Airway Management Planned:   Additional Equipment: Arterial line  Intra-op Plan:   Post-operative Plan:   Informed Consent: I have reviewed the patients History and Physical, chart, labs and discussed the procedure including the risks, benefits and alternatives for the proposed anesthesia with the patient or authorized representative who has indicated his/her understanding and acceptance.       Plan Discussed with: CRNA  Anesthesia Plan Comments:        Anesthesia Quick Evaluation

## 2022-04-17 NOTE — Anesthesia Procedure Notes (Signed)
Procedure Name: MAC Date/Time: 04/17/2022 8:37 AM  Performed by: Renato Shin, CRNAPre-anesthesia Checklist: Patient identified, Emergency Drugs available, Suction available and Patient being monitored Patient Re-evaluated:Patient Re-evaluated prior to induction Oxygen Delivery Method: Nasal cannula Preoxygenation: Pre-oxygenation with 100% oxygen Induction Type: IV induction Placement Confirmation: positive ETCO2 and breath sounds checked- equal and bilateral Dental Injury: Teeth and Oropharynx as per pre-operative assessment

## 2022-04-17 NOTE — Anesthesia Postprocedure Evaluation (Signed)
Anesthesia Post Note  Patient: Calvin Smith  Procedure(s) Performed: LEFT CAROTID STENT     Patient location during evaluation: PACU Anesthesia Type: MAC Level of consciousness: awake and alert Pain management: pain level controlled Vital Signs Assessment: post-procedure vital signs reviewed and stable Respiratory status: spontaneous breathing, nonlabored ventilation, respiratory function stable and patient connected to nasal cannula oxygen Cardiovascular status: stable and blood pressure returned to baseline Postop Assessment: no apparent nausea or vomiting Anesthetic complications: no   No notable events documented.  Last Vitals:  Vitals:   04/17/22 1300 04/17/22 1330  BP: 94/67 101/76  Pulse: 88 94  Resp: 18 14  Temp:    SpO2: 95% 96%    Last Pain:  Vitals:   04/17/22 1200  TempSrc: Oral  PainSc: 0-No pain                 Effie Berkshire

## 2022-04-17 NOTE — Transfer of Care (Signed)
Immediate Anesthesia Transfer of Care Note  Patient: Calvin Smith  Procedure(s) Performed: LEFT CAROTID STENT  Patient Location: PACU  Anesthesia Type:MAC  Level of Consciousness: awake and patient cooperative  Airway & Oxygen Therapy: Patient Spontanous Breathing  Post-op Assessment: Report given to RN and Post -op Vital signs reviewed and stable  Post vital signs: Reviewed and stable  Last Vitals:  Vitals Value Taken Time  BP 95/67 04/17/22 1030  Temp    Pulse 90 04/17/22 1037  Resp 14 04/17/22 1037  SpO2 95 % 04/17/22 1037  Vitals shown include unvalidated device data.  Last Pain:  Vitals:   04/17/22 0714  TempSrc:   PainSc: 0-No pain         Complications: No notable events documented.

## 2022-04-18 ENCOUNTER — Encounter (HOSPITAL_COMMUNITY): Payer: Self-pay | Admitting: Neuroradiology

## 2022-04-18 DIAGNOSIS — I6522 Occlusion and stenosis of left carotid artery: Secondary | ICD-10-CM | POA: Diagnosis not present

## 2022-04-18 LAB — PLATELET INHIBITION P2Y12

## 2022-04-18 NOTE — Discharge Summary (Signed)
Patient ID: Calvin Smith MRN: [id number] DOB/AGE: December 04, 1960 61 y.o.  Admit date: 04/17/2022 Discharge date: 04/18/2022  Supervising Physician: Pedro Earls  Patient Status: [facility] - In-pt  Admission Diagnoses: Left Carotid Artery Stenosis  Discharge Diagnoses:  Principal Problem:   Left carotid artery stenosis  Discharged Condition: good  Hospital Course:   Calvin Smith, 61 year old male, has a medical history significant for DM, HTN, migraines and sleep apnea. He initially presented to the hospital 03/22/22 as a Code Stroke and a cerebral angiogram showed severe stenosis in the left vertebral artery, right carotid bifurcation, left carotid bifurcation and the right vertebral artery. He underwent stenting of the left vertebral artery 03/24/22 with Dr. Karenann Cai.   He followed up with Dr. Karenann Cai 04/06/22 to discuss carotid stenting and the decision was made to proceed with the left carotid first. The patient presented to [facility] 04/17/22 as an outpatient for a diagnostic cerebral angiogram with left carotid angioplasty and stenting. This procedure was done under general anesthesia with planned overnight observation in the Neuro ICU. With the exception of some hypotension requiring a brief period of pressors, the patient had an uneventful night and is ready to go home today.   His blood pressure remains slightly low and we will hold his morning dose of lisinopril. The patient will check his blood pressure at home and will resume lisinopril when his blood pressure is back to baseline. If his blood pressure does not return to baseline he will contact his PCP to discuss.   The patient will follow up with Dr. Karenann Cai in approximately 3 weeks. A scheduler from our office will call him with a date/time of his appointment.   He will tentatively be scheduled for an intervention on the right carotid artery in approximately 6 weeks. The patient was  previously taking Plavix 75 mg and Aspirin 81 mg. He will continue taking these medications. A P2Y12 was drawn this morning. Changes to his anticoagulation may be made once this has resulted. IR will call the patient to discuss if necessary.   The patient and his wife know they can call NIR with any questions/concerns prior to the follow up with Dr. Karenann Cai in 3 weeks.    Consults: None  Significant Diagnostic Studies: IR US Guide Vasc Access Right  Result Date: 04/17/2022 INDICATION: 61 year old male with past medical history significant for diabetes, hypertension, hyperlipidemia, migraines, sleep apnea who presented to the ED for evaluation after an episode of ataxic gait, vertigo and nausea. Symptoms resolved after 20 minutes. CT of the head without contrast showed no acute intracranial abnormality. CT angiogram of the head and neck showed multifocal severe stenosis in bilateral carotid bifurcation and origin of the vertebral arteries. He underwent uneventful stenting of the left vertebral artery on March 24, 2022. He returns today to our service for a left carotid angioplasty and stenting for treatment of high-grade stenosis. EXAM: ULTRASOUND-GUIDED VASCULAR ACCESS DIAGNOSTIC CEREBRAL ANGIOGRAM RIGHT CAROTID ANGIOPLASTY AND STENTING THE CEREBRAL PROTECTION DEVICE COMPARISON:  Cerebral angiogram March 23, 2022 MEDICATIONS: Ancef 2 g IV. The antibiotic was administered within 1 hour of the procedure ANESTHESIA/SEDATION: The procedure was performed under monitored anesthesia care. CONTRAST:  70 mL of Omnipaque 300 milligram/mL FLUOROSCOPY: Radiation Exposure Index (as provided by the fluoroscopic device): 932 mGy Kerma COMPLICATIONS: None immediate. TECHNIQUE: Informed written consent was obtained from the patient after a thorough discussion of the procedural risks, benefits and alternatives. All questions were  addressed. Maximal Sterile Barrier Technique was utilized including caps, mask,  sterile gowns, sterile gloves, sterile drape, hand hygiene and skin antiseptic. A timeout was performed prior to the initiation of the procedure. The right groin was prepped and draped in the usual sterile fashion. Using a micropuncture kit and the modified Seldinger technique, access was gained to the right common femoral artery and an 8 French sheath was placed. Real-time ultrasound guidance was utilized for vascular access including the acquisition of a permanent ultrasound image documenting patency of the accessed vessel. Under fluoroscopy, a 5 [place] Berenstein 2 catheter was navigated over a 0.035" Terumo Glidewire into the aortic arch. The catheter was placed into the left common carotid artery. Frontal and lateral angiograms of the neck were obtained. The catheter was then placed into the left subclavian artery. Frontal angiogram of the neck and lateral angiograms of the head and neck were obtained. Then, the catheter was again placed into the left common carotid artery. Frontal and lateral angiograms of the head were obtained followed by magnified right anterior oblique and lateral views of the neck. FINDINGS: 1. Normal caliber of the right common femoral artery, adequate for vascular access. 2. Left subclavian artery angiogram showed previously placed stent into the proximal left vertebral artery widely patent with brisk contrast opacification of the cervical and intracranial left vertebral artery, as well as basilar artery and bilateral posterior cerebral arteries. 3. Brisk contrast opacification of the left MCA and left ACA vascular tree without significant intracranial stenosis, occlusion or other significant vascular abnormality. 4. Calcified atherosclerotic plaques in the left carotid bifurcation resulting in approximately 80% stenosis. PROCEDURE: The 5 [place] Berenstein 2 catheter was exchanged over the wire and under biplane roadmap guidance for a shuttle sheath which was placed into the distal left  common carotid artery. Frontal and lateral angiograms of the neck were then obtained in utilized as biplane roadmap. Then, a 2.5-4.8 mm Emboshield NAV 6 cerebral protection device was navigated into the distal cervical segment of the left ICA. Subsequently, a 4 x 30 mm Viatrac balloon was navigated to the level of the left carotid bifurcation. Angioplasty was performed under fluoroscopy. The balloon was removed. Then, a 6-8 x 40 mm Xact carotid stent was deployed across the bifurcation. Next, a 5 x 20 mm Viatrac balloon was navigated into the recently deployed stent. In stent angioplasty was performed under fluoroscopy. The balloon was removed. Then, the cerebral protection device was recaptured. Left common carotid artery angiograms with frontal and lateral views of the neck showed adequate stent positioning with brisk anterograde flow without flow limitation or significant stenosis. Left common carotid artery angiograms with frontal and lateral views of the head showed no evidence of thromboembolic complication. Delay left common carotid artery angiograms with frontal and lateral views of the neck showed no evidence of delayed in stent clot formation. The catheter was subsequently withdrawn. Right common femoral artery angiogram was obtained in . The puncture is at the level of the common femoral artery. The artery has normal caliber, adequate for closure device. The sheath was exchanged over the wire for a Perclose prostyle which was utilized for access closure. Immediate hemostasis was achieved. IMPRESSION: Successful and uncomplicated cervical left carotid angioplasty and stenting with cerebral protection device for treatment of high-grade stenosis. PLAN: Admitted to ICU for close post procedure monitoring. Electronically Signed   By: Pedro Earls M.D.   On: 04/17/2022 14:23   IR INTRAVSC STENT CERV CAROTID W/EMB-PROT MOD SED  Result  Date: 04/17/2022 INDICATION: 61 year old male with past  medical history significant for diabetes, hypertension, hyperlipidemia, migraines, sleep apnea who presented to the ED for evaluation after an episode of ataxic gait, vertigo and nausea. Symptoms resolved after 20 minutes. CT of the head without contrast showed no acute intracranial abnormality. CT angiogram of the head and neck showed multifocal severe stenosis in bilateral carotid bifurcation and origin of the vertebral arteries. He underwent uneventful stenting of the left vertebral artery on March 24, 2022. He returns today to our service for a left carotid angioplasty and stenting for treatment of high-grade stenosis. EXAM: ULTRASOUND-GUIDED VASCULAR ACCESS DIAGNOSTIC CEREBRAL ANGIOGRAM RIGHT CAROTID ANGIOPLASTY AND STENTING THE CEREBRAL PROTECTION DEVICE COMPARISON:  Cerebral angiogram March 23, 2022 MEDICATIONS: Ancef 2 g IV. The antibiotic was administered within 1 hour of the procedure ANESTHESIA/SEDATION: The procedure was performed under monitored anesthesia care. CONTRAST:  70 mL of Omnipaque 300 milligram/mL FLUOROSCOPY: Radiation Exposure Index (as provided by the fluoroscopic device): 474 mGy Kerma COMPLICATIONS: None immediate. TECHNIQUE: Informed written consent was obtained from the patient after a thorough discussion of the procedural risks, benefits and alternatives. All questions were addressed. Maximal Sterile Barrier Technique was utilized including caps, mask, sterile gowns, sterile gloves, sterile drape, hand hygiene and skin antiseptic. A timeout was performed prior to the initiation of the procedure. The right groin was prepped and draped in the usual sterile fashion. Using a micropuncture kit and the modified Seldinger technique, access was gained to the right common femoral artery and an 8 French sheath was placed. Real-time ultrasound guidance was utilized for vascular access including the acquisition of a permanent ultrasound image documenting patency of the accessed vessel. Under  fluoroscopy, a 5 [place] Berenstein 2 catheter was navigated over a 0.035" Terumo Glidewire into the aortic arch. The catheter was placed into the left common carotid artery. Frontal and lateral angiograms of the neck were obtained. The catheter was then placed into the left subclavian artery. Frontal angiogram of the neck and lateral angiograms of the head and neck were obtained. Then, the catheter was again placed into the left common carotid artery. Frontal and lateral angiograms of the head were obtained followed by magnified right anterior oblique and lateral views of the neck. FINDINGS: 1. Normal caliber of the right common femoral artery, adequate for vascular access. 2. Left subclavian artery angiogram showed previously placed stent into the proximal left vertebral artery widely patent with brisk contrast opacification of the cervical and intracranial left vertebral artery, as well as basilar artery and bilateral posterior cerebral arteries. 3. Brisk contrast opacification of the left MCA and left ACA vascular tree without significant intracranial stenosis, occlusion or other significant vascular abnormality. 4. Calcified atherosclerotic plaques in the left carotid bifurcation resulting in approximately 80% stenosis. PROCEDURE: The 5 [place] Berenstein 2 catheter was exchanged over the wire and under biplane roadmap guidance for a shuttle sheath which was placed into the distal left common carotid artery. Frontal and lateral angiograms of the neck were then obtained in utilized as biplane roadmap. Then, a 2.5-4.8 mm Emboshield NAV 6 cerebral protection device was navigated into the distal cervical segment of the left ICA. Subsequently, a 4 x 30 mm Viatrac balloon was navigated to the level of the left carotid bifurcation. Angioplasty was performed under fluoroscopy. The balloon was removed. Then, a 6-8 x 40 mm Xact carotid stent was deployed across the bifurcation. Next, a 5 x 20 mm Viatrac balloon was  navigated into the recently deployed stent. In stent  angioplasty was performed under fluoroscopy. The balloon was removed. Then, the cerebral protection device was recaptured. Left common carotid artery angiograms with frontal and lateral views of the neck showed adequate stent positioning with brisk anterograde flow without flow limitation or significant stenosis. Left common carotid artery angiograms with frontal and lateral views of the head showed no evidence of thromboembolic complication. Delay left common carotid artery angiograms with frontal and lateral views of the neck showed no evidence of delayed in stent clot formation. The catheter was subsequently withdrawn. Right common femoral artery angiogram was obtained in . The puncture is at the level of the common femoral artery. The artery has normal caliber, adequate for closure device. The sheath was exchanged over the wire for a Perclose prostyle which was utilized for access closure. Immediate hemostasis was achieved. IMPRESSION: Successful and uncomplicated cervical left carotid angioplasty and stenting with cerebral protection device for treatment of high-grade stenosis. PLAN: Admitted to ICU for close post procedure monitoring. Electronically Signed   By: Pedro Earls M.D.   On: 04/17/2022 14:23   IR Earney [place] Harrie Jeans Or Car A Stent  Result Date: 03/24/2022 INDICATION: 61 year old male with past medical history significant for diabetes, hypertension, hyperlipidemia, migraines, sleep apnea who presented to the ED for evaluation after an episode of ataxic gait, vertigo and nausea. Symptoms resolved after 20 minutes. CT of the head without contrast showed no acute intracranial abnormality. CT angiogram of the head and neck showed multifocal severe stenosis in bilateral carotid bifurcation and origin of the vertebral arteries densely calcified plaques. He underwent a diagnostic cerebral angiogram on March 24, 2019 3 seconds  confirmed CT angiogram findings. He returns today for left vertebral artery angioplasty and stenting for treatment of severe ostial stenosis. EXAM: ULTRASOUND-GUIDED VASCULAR ACCESS DIAGNOSTIC CEREBRAL ANGIOGRAM LEFT VERTEBRAL ARTERY ANGIOPLASTY AND STENTING COMPARISON:  Cerebral angiogram March 23, 2022. MEDICATIONS: 6000 units of heparin IV. ANESTHESIA/SEDATION: The procedure was performed under general anesthesia. CONTRAST:  75 mL of Omnipaque 300 milligram/mL FLUOROSCOPY: Radiation Exposure Index (as provided by the fluoroscopic device): 035 mGy Kerma COMPLICATIONS: None immediate. TECHNIQUE: Informed written consent was obtained from the patient after a thorough discussion of the procedural risks, benefits and alternatives. All questions were addressed. Maximal Sterile Barrier Technique was utilized including caps, mask, sterile gowns, sterile gloves, sterile drape, hand hygiene and skin antiseptic. A timeout was performed prior to the initiation of the procedure. The right groin was prepped and draped in the usual sterile fashion. Using a micropuncture kit and the modified Seldinger technique, access was gained to the right common femoral artery and an 8 French sheath was placed. Real-time ultrasound guidance was utilized for vascular access including the acquisition of a permanent ultrasound image documenting patency of the accessed vessel. Under fluoroscopy, a Cerebase guide catheter was navigated over a 0.035" Terumo Glidewire into the aortic arch. The catheter was placed into the left subclavian artery. Frontal and lateral angiograms of the head were obtained followed by frontal and lateral angiograms of the neck. FINDINGS: 1. Normal caliber of the right common femoral artery, adequate for vascular access. 2. Severely calcified plaque in the left subclavian artery extending to the at the origin of the left vertebral artery, resulting in severe stenosis at the origin of the left vertebral artery (85%),  without significant stenosis of the subclavian artery. Delayed flow is noted in the left vertebral artery distal to the stenosis. 3. The intracranial left vertebral artery, basilar artery, and bilateral posterior cerebral arteries are  unremarkable. PROCEDURE: Frontal and lateral angiograms of the neck were obtained via left subclavian contrast injection. Using biplane roadmap guidance, a 4.5 X 20 mm synergy balloon mounted stent was navigated over a 0.014 inch Aristotle guidewire into the proximal left vertebral artery. Frontal and lateral angiograms were obtained to evaluate stent positioning. The stent is at the proximal left vertebral artery with the proximal end at the level of the ostium. A 0.035" Terumo Glidewire was advanced into the distal left subclavian artery for support. The balloon was then inflated to its nominal pressure, expanding the stent within the proximal left vertebral artery. Left subclavian artery angiograms with frontal lateral views of the neck showed adequate stent positioning and expansion with complete resolution of stenosis. Angiograms with frontal and lateral views of the head showed no evidence of thromboembolic complication with improved opacification of the intracranial left vertebral artery, basilar artery and its main branches, and bilateral posterior cerebral arteries. Delayed angiograms showed no evidence of in stent clot formation. The catheter was subsequently withdrawn. Right common femoral artery angiogram was obtained in right anterior oblique view. The puncture is at the level of the common femoral artery. The artery has normal caliber, adequate for closure device. The sheath was exchanged over the wire for a Perclose ProGlide which was utilized for access closure. Immediate hemostasis was achieved. IMPRESSION: Successful and uncomplicated angioplasty and stenting of a severe left vertebral artery ostial stenosis. PLAN: 1. Transferred to ICU for monitoring for 24 hours. 2.  Continue Plavix 75 mg and aspirin 81 mg q.d. Electronically Signed   By: Pedro Earls M.D.   On: 03/24/2022 14:33   IR US Guide Vasc Access Right  Result Date: 03/24/2022 INDICATION: 61 year old male with past medical history significant for diabetes, hypertension, hyperlipidemia, migraines, sleep apnea who presented to the ED for evaluation after an episode of ataxic gait, vertigo and nausea. Symptoms resolved after 20 minutes. CT of the head without contrast showed no acute intracranial abnormality. CT angiogram of the head and neck showed multifocal severe stenosis in bilateral carotid bifurcation and origin of the vertebral arteries densely calcified plaques. He underwent a diagnostic cerebral angiogram on March 24, 2019 3 seconds confirmed CT angiogram findings. He returns today for left vertebral artery angioplasty and stenting for treatment of severe ostial stenosis. EXAM: ULTRASOUND-GUIDED VASCULAR ACCESS DIAGNOSTIC CEREBRAL ANGIOGRAM LEFT VERTEBRAL ARTERY ANGIOPLASTY AND STENTING COMPARISON:  Cerebral angiogram March 23, 2022. MEDICATIONS: 6000 units of heparin IV. ANESTHESIA/SEDATION: The procedure was performed under general anesthesia. CONTRAST:  75 mL of Omnipaque 300 milligram/mL FLUOROSCOPY: Radiation Exposure Index (as provided by the fluoroscopic device): 428 mGy Kerma COMPLICATIONS: None immediate. TECHNIQUE: Informed written consent was obtained from the patient after a thorough discussion of the procedural risks, benefits and alternatives. All questions were addressed. Maximal Sterile Barrier Technique was utilized including caps, mask, sterile gowns, sterile gloves, sterile drape, hand hygiene and skin antiseptic. A timeout was performed prior to the initiation of the procedure. The right groin was prepped and draped in the usual sterile fashion. Using a micropuncture kit and the modified Seldinger technique, access was gained to the right common femoral artery and an 8  French sheath was placed. Real-time ultrasound guidance was utilized for vascular access including the acquisition of a permanent ultrasound image documenting patency of the accessed vessel. Under fluoroscopy, a Cerebase guide catheter was navigated over a 0.035" Terumo Glidewire into the aortic arch. The catheter was placed into the left subclavian artery. Frontal and lateral angiograms of  the head were obtained followed by frontal and lateral angiograms of the neck. FINDINGS: 1. Normal caliber of the right common femoral artery, adequate for vascular access. 2. Severely calcified plaque in the left subclavian artery extending to the at the origin of the left vertebral artery, resulting in severe stenosis at the origin of the left vertebral artery (85%), without significant stenosis of the subclavian artery. Delayed flow is noted in the left vertebral artery distal to the stenosis. 3. The intracranial left vertebral artery, basilar artery, and bilateral posterior cerebral arteries are unremarkable. PROCEDURE: Frontal and lateral angiograms of the neck were obtained via left subclavian contrast injection. Using biplane roadmap guidance, a 4.5 X 20 mm synergy balloon mounted stent was navigated over a 0.014 inch Aristotle guidewire into the proximal left vertebral artery. Frontal and lateral angiograms were obtained to evaluate stent positioning. The stent is at the proximal left vertebral artery with the proximal end at the level of the ostium. A 0.035" Terumo Glidewire was advanced into the distal left subclavian artery for support. The balloon was then inflated to its nominal pressure, expanding the stent within the proximal left vertebral artery. Left subclavian artery angiograms with frontal lateral views of the neck showed adequate stent positioning and expansion with complete resolution of stenosis. Angiograms with frontal and lateral views of the head showed no evidence of thromboembolic complication with  improved opacification of the intracranial left vertebral artery, basilar artery and its main branches, and bilateral posterior cerebral arteries. Delayed angiograms showed no evidence of in stent clot formation. The catheter was subsequently withdrawn. Right common femoral artery angiogram was obtained in right anterior oblique view. The puncture is at the level of the common femoral artery. The artery has normal caliber, adequate for closure device. The sheath was exchanged over the wire for a Perclose ProGlide which was utilized for access closure. Immediate hemostasis was achieved. IMPRESSION: Successful and uncomplicated angioplasty and stenting of a severe left vertebral artery ostial stenosis. PLAN: 1. Transferred to ICU for monitoring for 24 hours. 2. Continue Plavix 75 mg and aspirin 81 mg q.d. Electronically Signed   By: Pedro Earls M.D.   On: 03/24/2022 14:33   IR US Guide Vasc Access Right  Result Date: 03/23/2022 INDICATION: 61 year old male with past medical history significant for diabetes, hypertension, hyperlipidemia, migraines, sleep apnea who presented to the ED for evaluation after an episode of ataxic gait, vertigo and nausea. Symptoms resolved after 20 minutes. CT of the head without contrast showed no acute intracranial abnormality. CT angiogram of the head and neck showed multifocal severe stenosis in bilateral carotid bifurcation and origin of the vertebral arteries. Due to densely calcified plaques, decision was in made to proceed with a diagnostic cerebral angiogram to better evaluate the degree of stenosis. EXAM: BILATERAL COMMON CAROTID AND INNOMINATE ANGIOGRAPHY AND BILATERAL VERTEBRAL ARTERY ANGIOGRAMS MEDICATIONS: No antibiotics administered. ANESTHESIA/SEDATION: Moderate (conscious) sedation was employed during this procedure. A total of Versed 2 mg and Fentanyl 50 mcg was administered intravenously by the radiology nurse. Total intra-service moderate Sedation  Time: 43 minutes. The patient's level of consciousness and vital signs were monitored continuously by radiology nursing throughout the procedure under my direct supervision. FLUOROSCOPY: Radiation Exposure Index (as provided by the fluoroscopic device): 144 mGy Kerma COMPLICATIONS: None immediate. TECHNIQUE: Informed written consent was obtained from the patient after a thorough discussion of the procedural risks, benefits and alternatives. All questions were addressed. Maximal Sterile Barrier Technique was utilized including caps, mask, sterile gowns,  sterile gloves, sterile drape, hand hygiene and skin antiseptic. A timeout was performed prior to the initiation of the procedure. The right groin was prepped and draped in the usual sterile fashion. Using a micropuncture kit and the modified Seldinger technique, access was gained to the right common femoral artery and a 5 French sheath was placed. Real-time ultrasound guidance was utilized for vascular access including the acquisition of a permanent ultrasound image documenting patency of the accessed vessel. Under fluoroscopy, a 5 [place] Berenstein 2 catheter was navigated over 0.035" Terumo Glidewire into the aortic arch. The catheter was placed into the left subclavian artery. Frontal, lateral and multiple oblique angiograms of the neck were obtained. Then, frontal and lateral view of the head was obtained. The catheter was then placed into the right common carotid artery. Frontal, lateral and bilateral oblique angiograms of the neck were obtained. Then, frontal and lateral angiograms of the head were obtained. The catheter was subsequently placed into the right subclavian artery. Frontal and lateral angiograms of the neck were obtained followed by frontal and lateral angiograms of the head. Next, the catheter was placed into the left common carotid artery. Frontal, lateral and bilateral oblique angiograms of the neck were obtained followed by frontal and lateral  angiograms of the head. The catheter was subsequently withdrawn. Right common femoral artery angiogram was obtained in right anterior oblique view. The puncture is at the level of the common femoral artery. The artery has normal caliber, adequate for closure device. A 5 [place] Exoseal was utilized for access closure. Immediate hemostasis was achieved. PROCEDURE: No intervention was performed. FINDINGS: Left subclavian angiograms: Severely calcified plaque in the left subclavian artery extending to the at the origin of the left vertebral artery, resulting in severe stenosis at the origin of the left vertebral artery (85%), without significant stenosis of the subclavian artery. Delayed flow is noted in the left vertebral artery distal to the stenosis. Left vertebral artery angiograms: The intracranial left vertebral artery, basilar artery, and bilateral posterior cerebral arteries are unremarkable. Luminal caliber is smooth and tapering. No aneurysms or abnormally high-flow, early draining veins are seen. No regions of abnormal hypervascularity are noted. The visualized dural sinuses are patent. Right CCA angiograms: Cervical angiograms show prominently calcified plaques along the right carotid bulb resulting in approximately 80% stenosis. Right ICA angiograms: There is brisk vascular contrast filling of the right ACA and MCA vascular trees. There is also brisk contrast opacification of the right PCA vascular tree via a prominent posterior communicating artery. Luminal caliber is smooth and tapering. No aneurysms or abnormally high-flow, early draining veins are seen. No regions of abnormal hypervascularity are noted. The visualized dural sinuses are patent. The visualized branches of the right external carotid artery are unremarkable. Right subclavian angiograms: Atherosclerotic changes at the origin of the right vertebral artery resulting in approximately 70% stenosis. The right subclavian artery have normal  caliber. Right vertebral artery angiograms: Mild atherosclerotic changes at the V4 segment of the right vertebral artery without hemodynamically significant stenosis. The basilar artery, and bilateral posterior cerebral arteries are unremarkable. Luminal caliber is smooth and tapering. No aneurysms or abnormally high-flow, early draining veins are seen. No regions of abnormal hypervascularity are noted. The visualized dural sinuses are patent. Left CCA angiograms: Prominently calcified plaques in the left carotid bifurcation resulting in approximately 80% stenosis. Left ICA angiograms: There is brisk vascular contrast filling of the left ACA and MCA vascular trees. Luminal caliber is smooth and tapering. No aneurysms or abnormally high-flow,  early draining veins are seen. No regions of abnormal hypervascularity are noted. The visualized dural sinuses are patent. The visualized branches of the left external carotid artery are unremarkable. Right common femoral artery angiograms: The access is at the level of the mid right common femoral artery. The femoral artery has normal caliber, adequate for closure device. IMPRESSION: 1. Severe atherosclerotic disease at the origin of the left vertebral artery resulting in approximately 85% stenosis. 2. Severe atherosclerotic disease at the right carotid bifurcation resulting approximately 80% stenosis. 3. Severe atherosclerotic disease at the left carotid bifurcation resulting in approximately 80% stenosis. 4. Atherosclerotic disease at the origin of the right vertebral artery resulting in approximately 70% stenosis. Electronically Signed   By: Pedro Earls M.D.   On: 03/23/2022 14:20   IR ANGIO VERTEBRAL SEL SUBCLAVIAN INNOMINATE BILAT MOD SED  Result Date: 03/23/2022 INDICATION: 61 year old male with past medical history significant for diabetes, hypertension, hyperlipidemia, migraines, sleep apnea who presented to the ED for evaluation after an episode  of ataxic gait, vertigo and nausea. Symptoms resolved after 20 minutes. CT of the head without contrast showed no acute intracranial abnormality. CT angiogram of the head and neck showed multifocal severe stenosis in bilateral carotid bifurcation and origin of the vertebral arteries. Due to densely calcified plaques, decision was in made to proceed with a diagnostic cerebral angiogram to better evaluate the degree of stenosis. EXAM: BILATERAL COMMON CAROTID AND INNOMINATE ANGIOGRAPHY AND BILATERAL VERTEBRAL ARTERY ANGIOGRAMS MEDICATIONS: No antibiotics administered. ANESTHESIA/SEDATION: Moderate (conscious) sedation was employed during this procedure. A total of Versed 2 mg and Fentanyl 50 mcg was administered intravenously by the radiology nurse. Total intra-service moderate Sedation Time: 43 minutes. The patient's level of consciousness and vital signs were monitored continuously by radiology nursing throughout the procedure under my direct supervision. FLUOROSCOPY: Radiation Exposure Index (as provided by the fluoroscopic device): 354 mGy Kerma COMPLICATIONS: None immediate. TECHNIQUE: Informed written consent was obtained from the patient after a thorough discussion of the procedural risks, benefits and alternatives. All questions were addressed. Maximal Sterile Barrier Technique was utilized including caps, mask, sterile gowns, sterile gloves, sterile drape, hand hygiene and skin antiseptic. A timeout was performed prior to the initiation of the procedure. The right groin was prepped and draped in the usual sterile fashion. Using a micropuncture kit and the modified Seldinger technique, access was gained to the right common femoral artery and a 5 French sheath was placed. Real-time ultrasound guidance was utilized for vascular access including the acquisition of a permanent ultrasound image documenting patency of the accessed vessel. Under fluoroscopy, a 5 [place] Berenstein 2 catheter was navigated over 0.035"  Terumo Glidewire into the aortic arch. The catheter was placed into the left subclavian artery. Frontal, lateral and multiple oblique angiograms of the neck were obtained. Then, frontal and lateral view of the head was obtained. The catheter was then placed into the right common carotid artery. Frontal, lateral and bilateral oblique angiograms of the neck were obtained. Then, frontal and lateral angiograms of the head were obtained. The catheter was subsequently placed into the right subclavian artery. Frontal and lateral angiograms of the neck were obtained followed by frontal and lateral angiograms of the head. Next, the catheter was placed into the left common carotid artery. Frontal, lateral and bilateral oblique angiograms of the neck were obtained followed by frontal and lateral angiograms of the head. The catheter was subsequently withdrawn. Right common femoral artery angiogram was obtained in right anterior oblique view. The puncture  is at the level of the common femoral artery. The artery has normal caliber, adequate for closure device. A 5 [place] Exoseal was utilized for access closure. Immediate hemostasis was achieved. PROCEDURE: No intervention was performed. FINDINGS: Left subclavian angiograms: Severely calcified plaque in the left subclavian artery extending to the at the origin of the left vertebral artery, resulting in severe stenosis at the origin of the left vertebral artery (85%), without significant stenosis of the subclavian artery. Delayed flow is noted in the left vertebral artery distal to the stenosis. Left vertebral artery angiograms: The intracranial left vertebral artery, basilar artery, and bilateral posterior cerebral arteries are unremarkable. Luminal caliber is smooth and tapering. No aneurysms or abnormally high-flow, early draining veins are seen. No regions of abnormal hypervascularity are noted. The visualized dural sinuses are patent. Right CCA angiograms: Cervical angiograms  show prominently calcified plaques along the right carotid bulb resulting in approximately 80% stenosis. Right ICA angiograms: There is brisk vascular contrast filling of the right ACA and MCA vascular trees. There is also brisk contrast opacification of the right PCA vascular tree via a prominent posterior communicating artery. Luminal caliber is smooth and tapering. No aneurysms or abnormally high-flow, early draining veins are seen. No regions of abnormal hypervascularity are noted. The visualized dural sinuses are patent. The visualized branches of the right external carotid artery are unremarkable. Right subclavian angiograms: Atherosclerotic changes at the origin of the right vertebral artery resulting in approximately 70% stenosis. The right subclavian artery have normal caliber. Right vertebral artery angiograms: Mild atherosclerotic changes at the V4 segment of the right vertebral artery without hemodynamically significant stenosis. The basilar artery, and bilateral posterior cerebral arteries are unremarkable. Luminal caliber is smooth and tapering. No aneurysms or abnormally high-flow, early draining veins are seen. No regions of abnormal hypervascularity are noted. The visualized dural sinuses are patent. Left CCA angiograms: Prominently calcified plaques in the left carotid bifurcation resulting in approximately 80% stenosis. Left ICA angiograms: There is brisk vascular contrast filling of the left ACA and MCA vascular trees. Luminal caliber is smooth and tapering. No aneurysms or abnormally high-flow, early draining veins are seen. No regions of abnormal hypervascularity are noted. The visualized dural sinuses are patent. The visualized branches of the left external carotid artery are unremarkable. Right common femoral artery angiograms: The access is at the level of the mid right common femoral artery. The femoral artery has normal caliber, adequate for closure device. IMPRESSION: 1. Severe  atherosclerotic disease at the origin of the left vertebral artery resulting in approximately 85% stenosis. 2. Severe atherosclerotic disease at the right carotid bifurcation resulting approximately 80% stenosis. 3. Severe atherosclerotic disease at the left carotid bifurcation resulting in approximately 80% stenosis. 4. Atherosclerotic disease at the origin of the right vertebral artery resulting in approximately 70% stenosis. Electronically Signed   By: Pedro Earls M.D.   On: 03/23/2022 14:20   IR ANGIO INTRA EXTRACRAN SEL COM CAROTID INNOMINATE BILAT MOD SED  Result Date: 03/23/2022 INDICATION: 61 year old male with past medical history significant for diabetes, hypertension, hyperlipidemia, migraines, sleep apnea who presented to the ED for evaluation after an episode of ataxic gait, vertigo and nausea. Symptoms resolved after 20 minutes. CT of the head without contrast showed no acute intracranial abnormality. CT angiogram of the head and neck showed multifocal severe stenosis in bilateral carotid bifurcation and origin of the vertebral arteries. Due to densely calcified plaques, decision was in made to proceed with a diagnostic cerebral angiogram to better  evaluate the degree of stenosis. EXAM: BILATERAL COMMON CAROTID AND INNOMINATE ANGIOGRAPHY AND BILATERAL VERTEBRAL ARTERY ANGIOGRAMS MEDICATIONS: No antibiotics administered. ANESTHESIA/SEDATION: Moderate (conscious) sedation was employed during this procedure. A total of Versed 2 mg and Fentanyl 50 mcg was administered intravenously by the radiology nurse. Total intra-service moderate Sedation Time: 43 minutes. The patient's level of consciousness and vital signs were monitored continuously by radiology nursing throughout the procedure under my direct supervision. FLUOROSCOPY: Radiation Exposure Index (as provided by the fluoroscopic device): 962 mGy Kerma COMPLICATIONS: None immediate. TECHNIQUE: Informed written consent was obtained  from the patient after a thorough discussion of the procedural risks, benefits and alternatives. All questions were addressed. Maximal Sterile Barrier Technique was utilized including caps, mask, sterile gowns, sterile gloves, sterile drape, hand hygiene and skin antiseptic. A timeout was performed prior to the initiation of the procedure. The right groin was prepped and draped in the usual sterile fashion. Using a micropuncture kit and the modified Seldinger technique, access was gained to the right common femoral artery and a 5 French sheath was placed. Real-time ultrasound guidance was utilized for vascular access including the acquisition of a permanent ultrasound image documenting patency of the accessed vessel. Under fluoroscopy, a 5 [place] Berenstein 2 catheter was navigated over 0.035" Terumo Glidewire into the aortic arch. The catheter was placed into the left subclavian artery. Frontal, lateral and multiple oblique angiograms of the neck were obtained. Then, frontal and lateral view of the head was obtained. The catheter was then placed into the right common carotid artery. Frontal, lateral and bilateral oblique angiograms of the neck were obtained. Then, frontal and lateral angiograms of the head were obtained. The catheter was subsequently placed into the right subclavian artery. Frontal and lateral angiograms of the neck were obtained followed by frontal and lateral angiograms of the head. Next, the catheter was placed into the left common carotid artery. Frontal, lateral and bilateral oblique angiograms of the neck were obtained followed by frontal and lateral angiograms of the head. The catheter was subsequently withdrawn. Right common femoral artery angiogram was obtained in right anterior oblique view. The puncture is at the level of the common femoral artery. The artery has normal caliber, adequate for closure device. A 5 [place] Exoseal was utilized for access closure. Immediate hemostasis was  achieved. PROCEDURE: No intervention was performed. FINDINGS: Left subclavian angiograms: Severely calcified plaque in the left subclavian artery extending to the at the origin of the left vertebral artery, resulting in severe stenosis at the origin of the left vertebral artery (85%), without significant stenosis of the subclavian artery. Delayed flow is noted in the left vertebral artery distal to the stenosis. Left vertebral artery angiograms: The intracranial left vertebral artery, basilar artery, and bilateral posterior cerebral arteries are unremarkable. Luminal caliber is smooth and tapering. No aneurysms or abnormally high-flow, early draining veins are seen. No regions of abnormal hypervascularity are noted. The visualized dural sinuses are patent. Right CCA angiograms: Cervical angiograms show prominently calcified plaques along the right carotid bulb resulting in approximately 80% stenosis. Right ICA angiograms: There is brisk vascular contrast filling of the right ACA and MCA vascular trees. There is also brisk contrast opacification of the right PCA vascular tree via a prominent posterior communicating artery. Luminal caliber is smooth and tapering. No aneurysms or abnormally high-flow, early draining veins are seen. No regions of abnormal hypervascularity are noted. The visualized dural sinuses are patent. The visualized branches of the right external carotid artery are unremarkable. Right subclavian  angiograms: Atherosclerotic changes at the origin of the right vertebral artery resulting in approximately 70% stenosis. The right subclavian artery have normal caliber. Right vertebral artery angiograms: Mild atherosclerotic changes at the V4 segment of the right vertebral artery without hemodynamically significant stenosis. The basilar artery, and bilateral posterior cerebral arteries are unremarkable. Luminal caliber is smooth and tapering. No aneurysms or abnormally high-flow, early draining veins are  seen. No regions of abnormal hypervascularity are noted. The visualized dural sinuses are patent. Left CCA angiograms: Prominently calcified plaques in the left carotid bifurcation resulting in approximately 80% stenosis. Left ICA angiograms: There is brisk vascular contrast filling of the left ACA and MCA vascular trees. Luminal caliber is smooth and tapering. No aneurysms or abnormally high-flow, early draining veins are seen. No regions of abnormal hypervascularity are noted. The visualized dural sinuses are patent. The visualized branches of the left external carotid artery are unremarkable. Right common femoral artery angiograms: The access is at the level of the mid right common femoral artery. The femoral artery has normal caliber, adequate for closure device. IMPRESSION: 1. Severe atherosclerotic disease at the origin of the left vertebral artery resulting in approximately 85% stenosis. 2. Severe atherosclerotic disease at the right carotid bifurcation resulting approximately 80% stenosis. 3. Severe atherosclerotic disease at the left carotid bifurcation resulting in approximately 80% stenosis. 4. Atherosclerotic disease at the origin of the right vertebral artery resulting in approximately 70% stenosis. Electronically Signed   By: Pedro Earls M.D.   On: 03/23/2022 14:20   DG CHEST PORT 1 VIEW  Result Date: 03/22/2022 CLINICAL DATA:  Recent TIA EXAM: PORTABLE CHEST 1 VIEW COMPARISON:  10/17/2006 FINDINGS: The heart size and mediastinal contours are within normal limits. Both lungs are clear. The visualized skeletal structures are unremarkable. IMPRESSION: No active disease. Electronically Signed   By: Inez Catalina M.D.   On: 03/22/2022 22:38   MR BRAIN WO CONTRAST  Result Date: 03/22/2022 CLINICAL DATA:  61 year old male TIA. Dizziness. Unsteady gait. Neurologic deficit. EXAM: MRI HEAD WITHOUT CONTRAST TECHNIQUE: Multiplanar, multiecho pulse sequences of the brain and surrounding  structures were obtained without intravenous contrast. COMPARISON:  CTA head and neck earlier today. FINDINGS: Brain: No restricted diffusion to suggest acute infarction. No midline shift, mass effect, evidence of mass lesion, ventriculomegaly, extra-axial collection or acute intracranial hemorrhage. Cervicomedullary junction and pituitary are within normal limits. Scattered small subcortical white matter T2 and FLAIR hyperintense foci, mild to moderate for age. Occasional periventricular involvement. No cortical encephalomalacia or chronic cerebral blood products identified. Deep gray nuclei, brainstem, and cerebellum are normal for age. Vascular: Major intracranial vascular flow voids are preserved. Skull and upper cervical spine: Negative. Visualized bone marrow signal is within normal limits. Sinuses/Orbits: Negative. Other: Mastoids are clear. Grossly negative visible internal auditory structures. Normal stylomastoid foramina. Negative visible scalp and face. IMPRESSION: 1. No acute intracranial abnormality. 2. Mild to moderate for age nonspecific cerebral white matter signal changes, most commonly due to chronic small vessel disease. Electronically Signed   By: Genevie Ann M.D.   On: 03/22/2022 09:19   CT ANGIO HEAD NECK W WO CM  Result Date: 03/22/2022 CLINICAL DATA:  61 year old male TIA.  Dizziness. EXAM: CT ANGIOGRAPHY HEAD AND NECK TECHNIQUE: Multidetector CT imaging of the head and neck was performed using the standard protocol during bolus administration of intravenous contrast. Multiplanar CT image reconstructions and MIPs were obtained to evaluate the vascular anatomy. Carotid stenosis measurements (when applicable) are obtained utilizing NASCET criteria, using the  distal internal carotid diameter as the denominator. RADIATION DOSE REDUCTION: This exam was performed according to the departmental dose-optimization program which includes automated exposure control, adjustment of the mA and/or kV  according to patient size and/or use of iterative reconstruction technique. CONTRAST:  71m OMNIPAQUE IOHEXOL 350 MG/ML SOLN COMPARISON:  Plain head CT 03/21/2022. FINDINGS: CTA NECK Skeleton: No acute osseous abnormality identified. Tympanic cavities and Visualized paranasal sinuses and mastoids are clear. Upper chest: Negative. Other neck: Motion artifact at the soft palate, oropharynx. No acute neck soft tissue finding identified. Aortic arch: Mild arch atherosclerosis. Three vessel arch configuration. Right carotid system: Minimal brachiocephalic artery origin plaque without stenosis. Normal right CCA in total lateral calcified plaque which is just proximal to the bifurcation not resulting in stenosis (series 4, image 115). Patent carotid bifurcation. Bulky calcified plaque at the right ICA origin and bulb. Radiographic string sign stenosis results as seen on series 6 images 207 through 201, along a segment of up to 16 mm. Right ICA remains patent and is otherwise negative to the skull base. Left carotid system: Mild left CCA origin plaque without stenosis. Bulky calcified plaque beginning at the left carotid bifurcation and continuing into the left ICA origin and bulb with high-grade stenosis just before the left ICA origin on series 6, image 217 approaching a radiographic string sign. Origin and bulb calcified plaque then results in less than 50 % stenosis with respect to the distal vessel. Vertebral arteries: Calcified plaque at the right subclavian artery origin without stenosis. Calcified plaque near the right vertebral artery origin,. Additional right V1 (series 6, images 288 and 273) with at least moderate V1 segment stenosis. But the right vertebral artery remains patent to the skull base with no additional plaque. Proximal left subclavian artery calcified plaque with no significant stenosis but bulky and long segment nearly 2.5 cm calcified plaque throughout the proximal left vertebral artery (series 7,  image 128) with high-grade stenosis. However, the left vertebral is mildly dominant and remains patent. No additional plaque or stenosis to the skull base. CTA HEAD Posterior circulation: Mildly dominant left vertebral V4 segment with normal left PICA origin and vertebrobasilar junction. No distal left vertebral plaque or stenosis. Contralateral right vertebral V4 segment moderate calcified plaque and stenosis on series 6, image 147. But the right vertebral remains patent to the vertebrobasilar junction. The right AICA appears dominant. Patent basilar artery with mild irregularity. No significant basilar stenosis. Patent SCA and PCA origins. Right posterior communicating artery is present. Bilateral PCA branches are patent with mild irregularity. Anterior circulation: Both ICA siphons are patent. Only mild supraclinoid ICA calcified plaque. No significant ICA siphon stenosis. Patent carotid termini. Patent MCA and ACA origins. Normal anterior communicating artery. Bilateral ACA branches are within normal limits. Left MCA M1 segment and bifurcation are patent without stenosis. Left MCA branches appear mildly irregular. Right MCA M1 segment is tortuous and bifurcates early without stenosis. Right MCA branches appear patent with mild irregularity. Venous sinuses: Early contrast timing, grossly patent. Anatomic variants: Mildly dominant left vertebral artery. Review of the MIP images confirms the above findings IMPRESSION: 1. Negative for large vessel occlusion, but positive for bulky calcified plaque at both carotid bifurcations and the proximal left vertebral artery: - Right ICA origin and bulb RADIOGRAPHIC STRING SIGN. - proximal Left Vertebral Artery RADIOGRAPHIC STRING SIGN. - Left Carotid Bifurcation High-grade Stenosis, approaching a radiographic string sign. 2. Also moderate Right Vertebral Artery V1 and V4 segment stenoses due to plaque. 3. Aortic Atherosclerosis (  ICD10-I70.0). Electronically Signed   By: Genevie Ann M.D.   On: 03/22/2022 05:12   CT HEAD WO CONTRAST  Result Date: 03/21/2022 CLINICAL DATA:  Dizziness. EXAM: CT HEAD WITHOUT CONTRAST TECHNIQUE: Contiguous axial images were obtained from the base of the skull through the vertex without intravenous contrast. RADIATION DOSE REDUCTION: This exam was performed according to the departmental dose-optimization program which includes automated exposure control, adjustment of the mA and/or kV according to patient size and/or use of iterative reconstruction technique. COMPARISON:  None Available. FINDINGS: Brain: No evidence of acute infarction, hemorrhage, hydrocephalus, extra-axial collection or mass lesion/mass effect. Vascular: No hyperdense vessel or unexpected calcification. Skull: Normal. Negative for fracture or focal lesion. Sinuses/Orbits: There is mild right ethmoid sinus mucosal thickening. Other: None. IMPRESSION: No acute intracranial abnormality. Electronically Signed   By: Virgina Norfolk M.D.   On: 03/21/2022 19:58    Treatments: Observation/Medical Management  Discharge Exam: Blood pressure 110/64, pulse 71, temperature 98.7 F (37.1 C), temperature source Oral, resp. rate 17, height _0  (1.676 m), weight 176 lb (79.8 kg), SpO2 96 %. Physical Exam Constitutional:      General: He is not in acute distress.    Appearance: Normal appearance. He is not ill-appearing.  Cardiovascular:     Rate and Rhythm: Normal rate and regular rhythm.     Comments: Right groin vascular site is clean, soft, dry and non-tender.  Pulmonary:     Effort: Pulmonary effort is normal.  Skin:    General: Skin is warm and dry.  Neurological:     Mental Status: He is alert and oriented to person, place, and time.     Cranial Nerves: No dysarthria or facial asymmetry.     Motor: No weakness.     Disposition: Discharge disposition: 01-Home or Self Care        Allergies as of 04/18/2022       Reactions   Bisoprolol-hydrochlorothiazide Other  (See Comments)    E.D. Unknown per Pt   Codeine Itching        Medication List     TAKE these medications    aspirin EC 81 MG tablet Take 81 mg by mouth daily.   atorvastatin 80 MG tablet Commonly known as: LIPITOR Take 1 tablet (80 mg total) by mouth daily.   clopidogrel 75 MG tablet Commonly known as: PLAVIX Take 1 tablet (75 mg total) by mouth daily.   empagliflozin 25 MG Tabs tablet Commonly known as: JARDIANCE Take 25 mg by mouth daily.   glipiZIDE 5 MG 24 hr tablet Commonly known as: GLUCOTROL XL Take 5 mg by mouth in the morning and at bedtime.   ibuprofen 200 MG tablet Commonly known as: ADVIL Take 400-600 mg by mouth every 8 (eight) hours as needed for headache.   Lantus SoloStar 100 UNIT/ML Solostar Pen Generic drug: insulin glargine INJECT SUBCUTANEOUSLY 90  UNITS AT BEDTIME What changed: See the new instructions.   lisinopril 20 MG tablet Commonly known as: ZESTRIL Take 20 mg by mouth daily.   metFORMIN 1000 MG tablet Commonly known as: GLUCOPHAGE Take 1,000 mg by mouth 2 (two) times daily with a meal.   Ozempic (2 MG/DOSE) 8 MG/3ML Sopn Generic drug: Semaglutide (2 MG/DOSE) Inject 2 mg into the skin every Saturday.   pantoprazole 40 MG tablet Commonly known as: PROTONIX Take 1 tablet (40 mg total) by mouth daily.   rizatriptan 10 MG tablet Commonly known as: Maxalt Take 1 tablet (10 mg total) by  mouth as needed for migraine. May repeat in 2 hours if needed        [place] Follow up.   Why: Follow up with Dr. Karenann Cai in approximately 3 weeks. A scheduler from our office will call you with a date/time of your appoinment. Please call our office with questions/concerns prior to your visit. Contact information: [place] [address] [phone]                  Electronically Signed: Theresa Duty, NP 04/18/2022, 10:09 AM   I have spent  Less Than 30 Minutes discharging Calvin Smith.

## 2022-04-18 NOTE — Progress Notes (Signed)
D/C education given to Pt and all questions answered. No printed prescriptions to give or equipment to deliver. IVs removed. Pt taken to car with all belongings.   ?

## 2022-04-19 ENCOUNTER — Telehealth: Payer: Self-pay

## 2022-04-19 DIAGNOSIS — Z0279 Encounter for issue of other medical certificate: Secondary | ICD-10-CM

## 2022-04-19 NOTE — Telephone Encounter (Signed)
Patient dropped off short term disability form.  Continuous leave from 04/17/22-05/28/22 with follow up appts in future.  The next appt is 04/25/22 with Butler Denmark in neurology.  Patient stated he will have a total of three surgeries for this same issue.  I have placed the form in your inbox for completion and signing.  When form has been completed and faxed, patient will pick up a copy of form.

## 2022-04-19 NOTE — Telephone Encounter (Signed)
Short term disability form has been faxed to F# [phone].  Received fax confirmation.  Copies have been made for billing, scanning, patient, and myself.  Patient's copy is in front office.  Patient was notified by phone call that he can pick up form anytime this afternoon or later, at his convenience.

## 2022-04-20 LAB — POCT ACTIVATED CLOTTING TIME
Activated Clotting Time: 137 seconds
Activated Clotting Time: 227 seconds

## 2022-04-24 ENCOUNTER — Other Ambulatory Visit: Payer: Self-pay | Admitting: Internal Medicine

## 2022-04-25 ENCOUNTER — Ambulatory Visit: Payer: 59 | Admitting: Neurology

## 2022-04-25 ENCOUNTER — Encounter: Payer: Self-pay | Admitting: Neurology

## 2022-04-25 VITALS — BP 120/80 | HR 98 | Ht 66.0 in | Wt 174.5 lb

## 2022-04-25 DIAGNOSIS — I6522 Occlusion and stenosis of left carotid artery: Secondary | ICD-10-CM

## 2022-04-25 DIAGNOSIS — G459 Transient cerebral ischemic attack, unspecified: Secondary | ICD-10-CM

## 2022-04-25 DIAGNOSIS — I10 Essential (primary) hypertension: Secondary | ICD-10-CM | POA: Diagnosis not present

## 2022-04-25 DIAGNOSIS — G43709 Chronic migraine without aura, not intractable, without status migrainosus: Secondary | ICD-10-CM

## 2022-04-25 DIAGNOSIS — I63211 Cerebral infarction due to unspecified occlusion or stenosis of right vertebral arteries: Secondary | ICD-10-CM

## 2022-04-25 MED ORDER — CLOPIDOGREL BISULFATE 75 MG PO TABS: 75.0000 mg | ORAL_TABLET | Freq: Every day | ORAL | 3 refills | Status: DC

## 2022-04-25 MED ORDER — UBRELVY 100 MG PO TABS: 100.0000 mg | ORAL_TABLET | ORAL | 3 refills | Status: DC | PRN

## 2022-04-25 NOTE — Progress Notes (Signed)
Patient: Calvin Smith Date of Birth: 30-Dec-1960  Reason for Visit: Follow up History from: Patient, wife Primary Neurologist: Saw Dr. Leonie Man   ASSESSMENT AND PLAN 61 y.o. year old male   44.  TIA, posterior -With vertebrobasilar insufficiency likely large vessel disease -DAPT aspirin 81 mg daily, Plavix 75 mg daily after intracranial stenting -He has done well, no lingering issues  2.  Carotid stenosis -Bilateral carotid high-grade stenosis on CTA, bilateral ICA 80% stenosis on cerebral angiogram -04/17/22 underwent left carotid stenting, planning for right in about 6 weeks -Keep close follow-up with Dr. Debbrah Alar  3.  Hypertension -BP goal less than 130/90 -At goal, on lisinopril  4.  Hyperlipidemia -LDL 62, goal less than 70 -On Lipitor 80  5.  Type 2 diabetes, controlled -A1c 6.1, goal less than 7.0 -On Lantus, Jardiance, Ozempic, metformin, glipizide  6.  History of migraine headaches -Try Ubrelvy 100 mg as needed for acute headache -Discussed avoid triptan given vascular risk factors  7.  OSA on CPAP -Needs to follow-up with pulmonary, contact info given  For any acute stroke symptoms he should go to the ER.  I will see him back in 6 months or sooner if needed.  HISTORY OF PRESENT ILLNESS: Today 04/25/22 [name] is here today for stroke clinic follow-up. 03/21/22 had sudden onset of ataxic gait while cleaning the pool, with vertigo and nausea.  EMS came, his wife brought him.  He was back to baseline.  CTA head and neck showed multifocal severe stenosis in bilateral carotids and left vertebral.  Had vertebral artery stenting with Dr. Norma Fredrickson 03/24/22.  He returned back to see Dr. Debbrah Alar 04/17/22 for left carotid stenting, no issues occurred. He will return back in another 6 weeks for right carotid stenting.  Is a Librarian, academic for [employer].  He is out on FMLA.  Has been doing well other than some fatigue.  He has a long history of migraine headache.  Takes  Maxalt, takes them rarely.  He has frequent headache at baseline.  Recently he went 3 days with no headache.  During hospitalization his Lipitor was increased from 20-40.  -CT head no acute abnormality -CTA head and neck bilateral ICA bulb high-grade stenosis, left V1 high-grade stenosis -Cerebral angio left V1 85% stenosis, right V1 70% stenosis, bilateral ICA 80% stenosis -MRI showed no acute infarct -Post left vertebral artery stenting 03/24/22 -LDL 62 -A1c 6.1 -Aspirin 81 mg daily prior to admission, DAPT aspirin 81 mg daily and Plavix 75 mg daily after intracranial stenting  HISTORY  Copied 03/22/22 Dr. Sharyn Lull & P: This is a 61 year old gentleman with past medical history significant for diabetes, hypertension, hyperlipidemia, migraines, sleep apnea who presented to the ED for evaluation after an episode of ataxic gait which began at 5:30 PM yesterday when he was cleaning his pool.  He suddenly felt very unsteady on his feet and had gait instability.  He was able to get into the house without falling and then laid down on the couch.  He had vertigo and nausea at this time as well.  Symptoms resolved after 20 minutes.  Patient has not had any similar episodes in the past.  Patient was brought in by EMS for further evaluation.  At that time he was completely asymptomatic with a stroke scale of 0.  CT of the head without contrast showed no acute intracranial abnormality. CTA H&N showed multifocal severe stenosis in bilat carotids and L vert:   CTA H&N  1. Negative for large vessel occlusion, but positive for bulky calcified plaque at both carotid bifurcations and the proximal left vertebral artery: - Right ICA origin and bulb RADIOGRAPHIC STRING SIGN. - proximal Left Vertebral Artery RADIOGRAPHIC STRING SIGN. - Left Carotid Bifurcation High-grade Stenosis, approaching a radiographic string sign. 2. Also moderate Right Vertebral Artery V1 and V4 segment stenoses due to plaque. 3. Aortic  Atherosclerosis (ICD10-I70.0).   MRI brain wo contrast showed no e/o acute infarct, moderate CSVID  REVIEW OF SYSTEMS: Out of a complete 14 system review of symptoms, the patient complains only of the following symptoms, and all other reviewed systems are negative.  See HPI  ALLERGIES: Allergies  Allergen Reactions   Bisoprolol-Hydrochlorothiazide Other (See Comments)     E.D. Unknown per Pt   Codeine Itching    HOME MEDICATIONS: Outpatient Medications Prior to Visit  Medication Sig Dispense Refill   aspirin EC 81 MG tablet Take 81 mg by mouth daily.     atorvastatin (LIPITOR) 80 MG tablet Take 1 tablet (80 mg total) by mouth daily. 30 tablet 2   empagliflozin (JARDIANCE) 25 MG TABS tablet Take 25 mg by mouth daily.     glipiZIDE (GLUCOTROL XL) 5 MG 24 hr tablet Take 5 mg by mouth in the morning and at bedtime.     ibuprofen (ADVIL) 200 MG tablet Take 400-600 mg by mouth every 8 (eight) hours as needed for headache.     LANTUS SOLOSTAR 100 UNIT/ML Solostar Pen INJECT SUBCUTANEOUSLY 90  UNITS AT BEDTIME (Patient taking differently: Inject 42 Units into the skin 2 (two) times daily.) 90 mL 3   lisinopril (ZESTRIL) 20 MG tablet Take 20 mg by mouth daily.     metFORMIN (GLUCOPHAGE) 1000 MG tablet Take 1,000 mg by mouth 2 (two) times daily with a meal.     pantoprazole (PROTONIX) 40 MG tablet Take 1 tablet (40 mg total) by mouth daily. 90 tablet 3   Semaglutide, 2 MG/DOSE, (OZEMPIC, 2 MG/DOSE,) 8 MG/3ML SOPN Inject 2 mg into the skin every Saturday.     clopidogrel (PLAVIX) 75 MG tablet Take 75 mg by mouth daily.     rizatriptan (MAXALT) 10 MG tablet Take 1 tablet (10 mg total) by mouth as needed for migraine. May repeat in 2 hours if needed 27 tablet 3   No facility-administered medications prior to visit.    PAST MEDICAL HISTORY: Past Medical History:  Diagnosis Date   Allergic rhinitis    Arm fracture, left as a child   DeQuervain's disease (tenosynovitis)    right wrist    Diabetes mellitus    type 2   Erectile dysfunction    GERD (gastroesophageal reflux disease)    Hyperlipidemia    Hypertension    Migraines    Peripheral vascular disease ([place])    Sleep apnea     PAST SURGICAL HISTORY: Past Surgical History:  Procedure Laterality Date   Cardiolite  5/06   Negative EF 66%   CARPAL TUNNEL RELEASE  9/03   left    IR ANGIO INTRA EXTRACRAN SEL COM CAROTID INNOMINATE BILAT MOD SED  03/23/2022   IR ANGIO INTRA EXTRACRAN SEL COM CAROTID INNOMINATE UNI L MOD SED  04/17/2022   IR ANGIO VERTEBRAL SEL SUBCLAVIAN INNOMINATE BILAT MOD SED  03/23/2022   IR ANGIO VERTEBRAL SEL SUBCLAVIAN INNOMINATE UNI L MOD SED  04/17/2022   IR ANGIO VERTEBRAL SEL VERTEBRAL UNI L MOD SED  03/29/2022   IR ANGIOGRAM EXTREMITY LEFT  03/29/2022  IR INTRAVSC STENT CERV CAROTID W/EMB-PROT MOD SED INCL ANGIO  04/17/2022   IR TRANSCATH EXCRAN VERT OR CAR A STENT  03/24/2022   IR US GUIDE VASC ACCESS RIGHT  03/23/2022   IR US GUIDE VASC ACCESS RIGHT  03/24/2022   IR US GUIDE VASC ACCESS RIGHT  04/17/2022   RADIOLOGY WITH ANESTHESIA Left 03/24/2022   Procedure: IR WITH ANESTHESIA;  Surgeon: Pedro Earls, MD;  Location: [facility];  Service: Radiology;  Laterality: Left;   RADIOLOGY WITH ANESTHESIA N/A 04/17/2022   Procedure: LEFT CAROTID STENT;  Surgeon: Pedro Earls, MD;  Location: [facility];  Service: Radiology;  Laterality: N/A;   WRIST SURGERY  1/04   DeQuervain release   WRIST SURGERY  1/08   ORIF l wrist DVR plate screwhead autologous    FAMILY HISTORY: Family History  Problem Relation Age of Onset   Hypertension Father    Lymphoma Father        non-hodgkins   Uterine cancer Maternal Grandmother    Hypertension Paternal Grandfather    Lung cancer Paternal Aunt    Coronary artery disease Paternal Uncle    Colon cancer Unknown        in 1 relative (?aunt)    SOCIAL HISTORY: Social History   Socioeconomic History   Marital status: Married    Spouse  name: Not on file   Number of children: Not on file   Years of education: Not on file   Highest education level: Not on file  Occupational History   Occupation: Buyer, 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  Tobacco Use   Smoking status: Never    Passive exposure: Past   Smokeless tobacco: Never  Vaping Use   Vaping Use: Never used  Substance and Sexual Activity   Alcohol use: Yes    Comment: rare   Drug use: No   Sexual activity: Yes    Partners: Female  Other Topics Concern   Not on file  Social History Narrative   Regular exercise: light to moderate   Caffeine use: coffee daily   Social Determinants of Health   Financial Resource Strain: Not on file  Food Insecurity: Not on file  Transportation Needs: Not on file  Physical Activity: Not on file  Stress: Not on file  Social Connections: Not on file  Intimate Partner Violence: Not on file    PHYSICAL EXAM  Vitals:   04/25/22 1009 04/25/22 1053  BP: 106/68 120/80  Pulse: 98   Weight: 174 lb 8 oz (79.2 kg)   Height: '5\' 6"'$  (1.676 m)    Body mass index is 28.17 kg/m.  Generalized: Well developed, in no acute distress  Neurological examination  Mentation: Alert oriented to time, place, history taking. Follows all commands speech and language fluent Cranial nerve II-XII: Pupils were equal round reactive to light. Extraocular movements were full, visual field were full on confrontational test. Facial sensation and strength were normal.  Head turning and shoulder shrug  were normal and symmetric. Motor: The motor testing reveals 5 over 5 strength of all 4 extremities. Good symmetric motor tone is noted throughout.  Sensory: Sensory testing is intact to soft touch on all 4 extremities. No evidence of extinction is noted.  Coordination: Cerebellar testing reveals good finger-nose-finger and heel-to-shin bilaterally.  Mild tremor with finger-nose-finger. Gait and station: Gait is normal. Tandem gait is  normal.  Reflexes: Deep tendon reflexes are symmetric and normal bilaterally.  DIAGNOSTIC DATA (LABS, IMAGING, TESTING) - I reviewed patient records, labs, notes, testing and imaging myself where available.  Lab Results  Component Value Date   WBC 7.4 04/17/2022   HGB 14.9 04/17/2022   HCT 41.9 04/17/2022   MCV 93.1 04/17/2022   PLT 222 04/17/2022      Component Value Date/Time   NA 132 (L) 04/17/2022 0709   NA 138 11/01/2021 1143   K 4.0 04/17/2022 0709   CL 99 04/17/2022 0709   CO2 23 04/17/2022 0709   GLUCOSE 129 (H) 04/17/2022 0709   BUN 18 04/17/2022 0709   BUN 13 11/01/2021 1143   CREATININE 1.14 04/17/2022 0709   CALCIUM 8.7 (L) 04/17/2022 0709   PROT 6.3 (L) 03/23/2022 0240   PROT 6.7 11/01/2021 1143   ALBUMIN 3.4 (L) 03/23/2022 0240   ALBUMIN 4.6 11/01/2021 1143   AST 30 03/23/2022 0240   ALT 40 03/23/2022 0240   ALKPHOS 57 03/23/2022 0240   BILITOT 1.5 (H) 03/23/2022 0240   BILITOT 0.7 11/01/2021 1143   GFRNONAA >60 04/17/2022 0709   GFRAA 74 02/17/2020 0855   Lab Results  Component Value Date   CHOL 113 03/22/2022   HDL 32 (L) 03/22/2022   LDLCALC 62 03/22/2022   TRIG 95 03/22/2022   CHOLHDL 3.5 03/22/2022   Lab Results  Component Value Date   HGBA1C 6.1 (H) 03/21/2022   Lab Results  Component Value Date   VITAMINB12 492 07/12/2016   Lab Results  Component Value Date   TSH 2.541 03/22/2022    Butler Denmark, AGNP-C, DNP 04/25/2022, 11:34 AM [facility] [address], [place], [place] [address] [phone]

## 2022-04-25 NOTE — Patient Instructions (Addendum)
Keep BP < 130/90, LDL < 70, A1C < 7.0 Follow up with pulmonary about CPAP Instead of Maxalt, try Ubrelvy for acute headache treatment Keep follow up with IR  Stay on aspirin 81 mg daily and Plavix 75 mg daily  See you back in 6 months

## 2022-04-26 ENCOUNTER — Telehealth: Payer: Self-pay

## 2022-04-26 ENCOUNTER — Other Ambulatory Visit: Payer: Self-pay | Admitting: Neurology

## 2022-04-26 MED ORDER — UBRELVY 100 MG PO TABS: 100.0000 mg | ORAL_TABLET | ORAL | 3 refills | Status: DC | PRN

## 2022-04-26 NOTE — Telephone Encounter (Signed)
PA for Roselyn Meier has been sent via cmm  (Key: [id number])  [employer] is reviewing your PA request. Typically an electronic response will be received within 24-72 hours. To check for an update later, open this request from your dashboard.  You may close this dialog and return to your dashboard to perform other tasks.

## 2022-04-26 NOTE — Progress Notes (Signed)
I got a note from insurance # 16 tablets denied, looks like would cover # 10 tablets, I will send a new order. Thanks.  Meds ordered this encounter  Medications   Ubrogepant (UBRELVY) 100 MG TABS    Sig: Take 100 mg by mouth as needed (Take 1 tablet at onset of headache, may repeat in 2 hours if needed, max is 200 mg in 24 hours).    Dispense:  10 tablet    Refill:  3    Cancel out the # 16 tablets

## 2022-04-29 NOTE — Progress Notes (Signed)
I agree with the above plan 

## 2022-05-03 NOTE — Telephone Encounter (Signed)
(  Key: [id number])  This request has received a Favorable outcome.  Please note any additional information provided by [employer] at the bottom of your screen.

## 2022-05-08 ENCOUNTER — Ambulatory Visit (HOSPITAL_COMMUNITY)
Admission: RE | Admit: 2022-05-08 | Discharge: 2022-05-08 | Disposition: A | Discharge status: Home / Self Care | Payer: 59 | Source: Ambulatory Visit | Attending: Student | Admitting: Student

## 2022-05-08 DIAGNOSIS — I63211 Cerebral infarction due to unspecified occlusion or stenosis of right vertebral arteries: Secondary | ICD-10-CM

## 2022-05-08 NOTE — Consult Note (Signed)
Chief Complaint: Patient was seen in consultation today for right carotid stenosis.  Referring Physician(s): Covington,Jamie R  Supervising Physician: Pedro Earls  Patient Status: [facility] - Out-pt  History of Present Illness: Calvin Smith is a 61 y.o. male with a past medical history significant for DM, HTN, HLD, migraine headaches, sleep apnea. Patent presented to the ED at [facility] on 8.16.23  One episode of unsteady gait, diaphoresis and dizziness that self resolved.  Code stroke was called at the patient was transferred to [facility] for further evaluation. Cerebral angiogram performed on 8.17.23 showed severe stenosis in the left verbal artery, right carotid bifurcation, left carotid bifurcation and right vertebral artery. He underwent stenting of the left vertebral artery on 08.18.23 for symptomatic vertebrobasilar insufficiency without incident. He did well post procedure without complications. He returned on 04/17/2022 for elective left carotid angioplasty and stenting. Procedure was uneventful. He returns today for follow-up and for right carotid stenting planning.   Calvin Smith has been doing well since the procedure. He complains only of mild postural dizziness when standing up quickly  and feeling a little tired. He says dizziness subsides after a few seconds and tiredness is tolerable. No other complaints at the moment.     Past Medical History:  Diagnosis Date   Allergic rhinitis    Arm fracture, left as a child   DeQuervain's disease (tenosynovitis)    right wrist   Diabetes mellitus    type 2   Erectile dysfunction    GERD (gastroesophageal reflux disease)    Hyperlipidemia    Hypertension    Migraines    Peripheral vascular disease (HCC)    Sleep apnea     Past Surgical History:  Procedure Laterality Date   Cardiolite  5/06   Negative EF 66%   CARPAL TUNNEL RELEASE  9/03   left    IR ANGIO INTRA EXTRACRAN SEL COM CAROTID INNOMINATE BILAT MOD SED   03/23/2022   IR ANGIO INTRA EXTRACRAN SEL COM CAROTID INNOMINATE UNI L MOD SED  04/17/2022   IR ANGIO VERTEBRAL SEL SUBCLAVIAN INNOMINATE BILAT MOD SED  03/23/2022   IR ANGIO VERTEBRAL SEL SUBCLAVIAN INNOMINATE UNI L MOD SED  04/17/2022   IR ANGIO VERTEBRAL SEL VERTEBRAL UNI L MOD SED  03/29/2022   IR ANGIOGRAM EXTREMITY LEFT  03/29/2022   IR INTRAVSC STENT CERV CAROTID W/EMB-PROT MOD SED INCL ANGIO  04/17/2022   IR TRANSCATH EXCRAN VERT OR CAR A STENT  03/24/2022   IR US GUIDE VASC ACCESS RIGHT  03/23/2022   IR US GUIDE VASC ACCESS RIGHT  03/24/2022   IR US GUIDE VASC ACCESS RIGHT  04/17/2022   RADIOLOGY WITH ANESTHESIA Left 03/24/2022   Procedure: IR WITH ANESTHESIA;  Surgeon: Pedro Earls, MD;  Location: [place];  Service: Radiology;  Laterality: Left;   RADIOLOGY WITH ANESTHESIA N/A 04/17/2022   Procedure: LEFT CAROTID STENT;  Surgeon: Pedro Earls, MD;  Location: [place];  Service: Radiology;  Laterality: N/A;   WRIST SURGERY  1/04   DeQuervain release   WRIST SURGERY  1/08   ORIF l wrist DVR plate screwhead autologous    Allergies: Bisoprolol-hydrochlorothiazide and Codeine  Medications: Prior to Admission medications   Medication Sig Start Date End Date Taking? Authorizing Provider  aspirin EC 81 MG tablet Take 81 mg by mouth daily.    [provider]  atorvastatin (LIPITOR) 80 MG tablet Take 1 tablet (80 mg total) by mouth daily. 03/23/22 06/21/22  Collier Bullock, MD  clopidogrel (PLAVIX) 75 MG tablet TAKE 1 TABLET BY MOUTH DAILY 04/25/22   Viviana Simpler I, MD  empagliflozin (JARDIANCE) 25 MG TABS tablet Take 25 mg by mouth daily.    [provider]  glipiZIDE (GLUCOTROL XL) 5 MG 24 hr tablet Take 5 mg by mouth in the morning and at bedtime.    [provider]  ibuprofen (ADVIL) 200 MG tablet Take 400-600 mg by mouth every 8 (eight) hours as needed for headache.    [provider]  LANTUS SOLOSTAR 100 UNIT/ML Solostar Pen  INJECT SUBCUTANEOUSLY 90  UNITS AT BEDTIME Patient taking differently: Inject 42 Units into the skin 2 (two) times daily. 03/01/22   [name], MD  lisinopril (ZESTRIL) 20 MG tablet Take 20 mg by mouth daily.    [provider]  metFORMIN (GLUCOPHAGE) 1000 MG tablet Take 1,000 mg by mouth 2 (two) times daily with a meal.    [provider]  pantoprazole (PROTONIX) 40 MG tablet Take 1 tablet (40 mg total) by mouth daily. 04/06/22   [name], MD  Semaglutide, 2 MG/DOSE, (OZEMPIC, 2 MG/DOSE,) 8 MG/3ML SOPN Inject 2 mg into the skin every Saturday.    [provider]  Ubrogepant (UBRELVY) 100 MG TABS Take 100 mg by mouth as needed (Take 1 tablet at onset of headache, may repeat in 2 hours if needed, max is 200 mg in 24 hours). 04/26/22   Suzzanne Cloud, NP     Family History  Problem Relation Age of Onset   Hypertension Father    Lymphoma Father        non-hodgkins   Uterine cancer Maternal Grandmother    Hypertension Paternal Grandfather    Lung cancer Paternal Aunt    Coronary artery disease Paternal Uncle    Colon cancer Unknown        in 1 relative (?aunt)    Social History   Socioeconomic History   Marital status: Married    Spouse name: Not on file   Number of children: Not on file   Years of education: Not on file   Highest education level: Not on file  Occupational History   Occupation: SUPERVISOR    Employer: [employer]    Comment: immunoassay dept  Tobacco Use   Smoking status: Never    Passive exposure: Past   Smokeless tobacco: Never  Vaping Use   Vaping Use: Never used  Substance and Sexual Activity   Alcohol use: Yes    Comment: rare   Drug use: No   Sexual activity: Yes    Partners: Female  Other Topics Concern   Not on file  Social History Narrative   Regular exercise: light to moderate   Caffeine use: coffee daily   Social Determinants of Radio broadcast assistant Strain: Not on file  Food Insecurity: Not on  file  Transportation Needs: Not on file  Physical Activity: Not on file  Stress: Not on file  Social Connections: Not on file     Review of Systems: A 12 point ROS discussed and pertinent positives are indicated in the HPI above.  All other systems are negative.  Review of Systems  Vital Signs: There were no vitals taken for this visit.  Physical Exam Constitutional:      Appearance: Normal appearance.  HENT:     Head: Normocephalic and atraumatic.     Mouth/Throat:     Mouth: Mucous membranes are moist.  Pharynx: Oropharynx is clear.  Eyes:     Extraocular Movements: Extraocular movements intact.     Conjunctiva/sclera: Conjunctivae normal.     Pupils: Pupils are equal, round, and reactive to light.  Pulmonary:     Effort: Pulmonary effort is normal.  Neurological:     General: No focal deficit present.     Mental Status: He is alert and oriented to person, place, and time. Mental status is at baseline.          Imaging: IR US Guide Vasc Access Right  Result Date: 04/17/2022 INDICATION: 61 year old male with past medical history significant for diabetes, hypertension, hyperlipidemia, migraines, sleep apnea who presented to the ED for evaluation after an episode of ataxic gait, vertigo and nausea. Symptoms resolved after 20 minutes. CT of the head without contrast showed no acute intracranial abnormality. CT angiogram of the head and neck showed multifocal severe stenosis in bilateral carotid bifurcation and origin of the vertebral arteries. He underwent uneventful stenting of the left vertebral artery on March 24, 2022. He returns today to our service for a left carotid angioplasty and stenting for treatment of high-grade stenosis. EXAM: ULTRASOUND-GUIDED VASCULAR ACCESS DIAGNOSTIC CEREBRAL ANGIOGRAM RIGHT CAROTID ANGIOPLASTY AND STENTING THE CEREBRAL PROTECTION DEVICE COMPARISON:  Cerebral angiogram March 23, 2022 MEDICATIONS: Ancef 2 g IV. The antibiotic was  administered within 1 hour of the procedure ANESTHESIA/SEDATION: The procedure was performed under monitored anesthesia care. CONTRAST:  70 mL of Omnipaque 300 milligram/mL FLUOROSCOPY: Radiation Exposure Index (as provided by the fluoroscopic device): 194 mGy Kerma COMPLICATIONS: None immediate. TECHNIQUE: Informed written consent was obtained from the patient after a thorough discussion of the procedural risks, benefits and alternatives. All questions were addressed. Maximal Sterile Barrier Technique was utilized including caps, mask, sterile gowns, sterile gloves, sterile drape, hand hygiene and skin antiseptic. A timeout was performed prior to the initiation of the procedure. The right groin was prepped and draped in the usual sterile fashion. Using a micropuncture kit and the modified Seldinger technique, access was gained to the right common femoral artery and an 8 French sheath was placed. Real-time ultrasound guidance was utilized for vascular access including the acquisition of a permanent ultrasound image documenting patency of the accessed vessel. Under fluoroscopy, a 5 [place] Berenstein 2 catheter was navigated over a 0.035" Terumo Glidewire into the aortic arch. The catheter was placed into the left common carotid artery. Frontal and lateral angiograms of the neck were obtained. The catheter was then placed into the left subclavian artery. Frontal angiogram of the neck and lateral angiograms of the head and neck were obtained. Then, the catheter was again placed into the left common carotid artery. Frontal and lateral angiograms of the head were obtained followed by magnified right anterior oblique and lateral views of the neck. FINDINGS: 1. Normal caliber of the right common femoral artery, adequate for vascular access. 2. Left subclavian artery angiogram showed previously placed stent into the proximal left vertebral artery widely patent with brisk contrast opacification of the cervical and  intracranial left vertebral artery, as well as basilar artery and bilateral posterior cerebral arteries. 3. Brisk contrast opacification of the left MCA and left ACA vascular tree without significant intracranial stenosis, occlusion or other significant vascular abnormality. 4. Calcified atherosclerotic plaques in the left carotid bifurcation resulting in approximately 80% stenosis. PROCEDURE: The 5 [place] Berenstein 2 catheter was exchanged over the wire and under biplane roadmap guidance for a shuttle sheath which was placed into the distal left common carotid  artery. Frontal and lateral angiograms of the neck were then obtained in utilized as biplane roadmap. Then, a 2.5-4.8 mm Emboshield NAV 6 cerebral protection device was navigated into the distal cervical segment of the left ICA. Subsequently, a 4 x 30 mm Viatrac balloon was navigated to the level of the left carotid bifurcation. Angioplasty was performed under fluoroscopy. The balloon was removed. Then, a 6-8 x 40 mm Xact carotid stent was deployed across the bifurcation. Next, a 5 x 20 mm Viatrac balloon was navigated into the recently deployed stent. In stent angioplasty was performed under fluoroscopy. The balloon was removed. Then, the cerebral protection device was recaptured. Left common carotid artery angiograms with frontal and lateral views of the neck showed adequate stent positioning with brisk anterograde flow without flow limitation or significant stenosis. Left common carotid artery angiograms with frontal and lateral views of the head showed no evidence of thromboembolic complication. Delay left common carotid artery angiograms with frontal and lateral views of the neck showed no evidence of delayed in stent clot formation. The catheter was subsequently withdrawn. Right common femoral artery angiogram was obtained in . The puncture is at the level of the common femoral artery. The artery has normal caliber, adequate for closure device. The  sheath was exchanged over the wire for a Perclose prostyle which was utilized for access closure. Immediate hemostasis was achieved. IMPRESSION: Successful and uncomplicated cervical left carotid angioplasty and stenting with cerebral protection device for treatment of high-grade stenosis. PLAN: Admitted to ICU for close post procedure monitoring. Electronically Signed   By: Pedro Earls M.D.   On: 04/17/2022 14:23   IR INTRAVSC STENT CERV CAROTID W/EMB-PROT MOD SED  Result Date: 04/17/2022 INDICATION: 61 year old male with past medical history significant for diabetes, hypertension, hyperlipidemia, migraines, sleep apnea who presented to the ED for evaluation after an episode of ataxic gait, vertigo and nausea. Symptoms resolved after 20 minutes. CT of the head without contrast showed no acute intracranial abnormality. CT angiogram of the head and neck showed multifocal severe stenosis in bilateral carotid bifurcation and origin of the vertebral arteries. He underwent uneventful stenting of the left vertebral artery on March 24, 2022. He returns today to our service for a left carotid angioplasty and stenting for treatment of high-grade stenosis. EXAM: ULTRASOUND-GUIDED VASCULAR ACCESS DIAGNOSTIC CEREBRAL ANGIOGRAM RIGHT CAROTID ANGIOPLASTY AND STENTING THE CEREBRAL PROTECTION DEVICE COMPARISON:  Cerebral angiogram March 23, 2022 MEDICATIONS: Ancef 2 g IV. The antibiotic was administered within 1 hour of the procedure ANESTHESIA/SEDATION: The procedure was performed under monitored anesthesia care. CONTRAST:  70 mL of Omnipaque 300 milligram/mL FLUOROSCOPY: Radiation Exposure Index (as provided by the fluoroscopic device): 875 mGy Kerma COMPLICATIONS: None immediate. TECHNIQUE: Informed written consent was obtained from the patient after a thorough discussion of the procedural risks, benefits and alternatives. All questions were addressed. Maximal Sterile Barrier Technique was utilized  including caps, mask, sterile gowns, sterile gloves, sterile drape, hand hygiene and skin antiseptic. A timeout was performed prior to the initiation of the procedure. The right groin was prepped and draped in the usual sterile fashion. Using a micropuncture kit and the modified Seldinger technique, access was gained to the right common femoral artery and an 8 French sheath was placed. Real-time ultrasound guidance was utilized for vascular access including the acquisition of a permanent ultrasound image documenting patency of the accessed vessel. Under fluoroscopy, a 5 [place] Berenstein 2 catheter was navigated over a 0.035" Terumo Glidewire into the aortic arch. The catheter  was placed into the left common carotid artery. Frontal and lateral angiograms of the neck were obtained. The catheter was then placed into the left subclavian artery. Frontal angiogram of the neck and lateral angiograms of the head and neck were obtained. Then, the catheter was again placed into the left common carotid artery. Frontal and lateral angiograms of the head were obtained followed by magnified right anterior oblique and lateral views of the neck. FINDINGS: 1. Normal caliber of the right common femoral artery, adequate for vascular access. 2. Left subclavian artery angiogram showed previously placed stent into the proximal left vertebral artery widely patent with brisk contrast opacification of the cervical and intracranial left vertebral artery, as well as basilar artery and bilateral posterior cerebral arteries. 3. Brisk contrast opacification of the left MCA and left ACA vascular tree without significant intracranial stenosis, occlusion or other significant vascular abnormality. 4. Calcified atherosclerotic plaques in the left carotid bifurcation resulting in approximately 80% stenosis. PROCEDURE: The 5 [place] Berenstein 2 catheter was exchanged over the wire and under biplane roadmap guidance for a shuttle sheath which was  placed into the distal left common carotid artery. Frontal and lateral angiograms of the neck were then obtained in utilized as biplane roadmap. Then, a 2.5-4.8 mm Emboshield NAV 6 cerebral protection device was navigated into the distal cervical segment of the left ICA. Subsequently, a 4 x 30 mm Viatrac balloon was navigated to the level of the left carotid bifurcation. Angioplasty was performed under fluoroscopy. The balloon was removed. Then, a 6-8 x 40 mm Xact carotid stent was deployed across the bifurcation. Next, a 5 x 20 mm Viatrac balloon was navigated into the recently deployed stent. In stent angioplasty was performed under fluoroscopy. The balloon was removed. Then, the cerebral protection device was recaptured. Left common carotid artery angiograms with frontal and lateral views of the neck showed adequate stent positioning with brisk anterograde flow without flow limitation or significant stenosis. Left common carotid artery angiograms with frontal and lateral views of the head showed no evidence of thromboembolic complication. Delay left common carotid artery angiograms with frontal and lateral views of the neck showed no evidence of delayed in stent clot formation. The catheter was subsequently withdrawn. Right common femoral artery angiogram was obtained in . The puncture is at the level of the common femoral artery. The artery has normal caliber, adequate for closure device. The sheath was exchanged over the wire for a Perclose prostyle which was utilized for access closure. Immediate hemostasis was achieved. IMPRESSION: Successful and uncomplicated cervical left carotid angioplasty and stenting with cerebral protection device for treatment of high-grade stenosis. PLAN: Admitted to ICU for close post procedure monitoring. Electronically Signed   By: Pedro Earls M.D.   On: 04/17/2022 14:23    Labs:  CBC: Recent Labs    03/21/22 1929 03/22/22 2220 03/23/22 0240  04/17/22 0709  WBC 8.2 7.0 8.1 7.4  HGB 14.5 14.7 14.2 14.9  HCT 41.1 40.1 38.9* 41.9  PLT 204 209 194 222    COAGS: Recent Labs    03/21/22 1929 04/17/22 0709  INR 1.1 1.1  APTT 28  --     BMP: Recent Labs    03/21/22 1929 03/22/22 2220 03/23/22 0240 04/17/22 0709  NA 131* 129* 130* 132*  K 4.2 4.0 3.9 4.0  CL 98 98 100 99  CO2 '24 23 22 23  ' GLUCOSE 153* 171* 100* 129*  BUN '15 11 10 18  ' CALCIUM 8.8* 8.5* 8.5*  8.7*  CREATININE 1.02 1.03 0.99 1.14  GFRNONAA >60 >60 >60 >60    LIVER FUNCTION TESTS: Recent Labs    11/01/21 1143 03/21/22 1929 03/22/22 2220 03/23/22 0240  BILITOT 0.7 1.8* 1.8* 1.5*  AST 40 36 33 30  ALT 48* 41 45* 40  ALKPHOS 77 69 60 57  PROT 6.7 7.4 6.6 6.3*  ALBUMIN 4.6 4.3 3.6 3.4*    TUMOR MARKERS: No results for input(s): "AFPTM", "CEA", "CA199", "CHROMGRNA" in the last 8760 hours.  Assessment and Plan:  Calvin Smith is doing very well post left vertebral artery and carotid artery stenting without evidence of focal neurological deficits.  He would like to proceed with treatment of the right carotid stenosis. His wife, who is present during the appointment, agrees with the plan. He should continue taking aspirin and plavix daily. All questions were answered to their satisfaction. Our schedulers will reach out to them to book the right carotid artery stenting under monitored anesthesia care.  Thank you for this interesting consult.  I greatly enjoyed meeting Calvin Smith and look forward to participating in their care.  A copy of this report was sent to the requesting provider on this date.  Electronically Signed: Pedro Earls, MD 05/08/2022, 3:41 PM   I spent a total of    25 Minutes in face to face in clinical consultation, greater than 50% of which was counseling/coordinating care for carotid artery stenosis.

## 2022-05-17 ENCOUNTER — Other Ambulatory Visit (HOSPITAL_COMMUNITY): Payer: Self-pay | Admitting: Neuroradiology

## 2022-05-17 ENCOUNTER — Encounter: Payer: Self-pay | Admitting: Internal Medicine

## 2022-05-17 DIAGNOSIS — I6521 Occlusion and stenosis of right carotid artery: Secondary | ICD-10-CM

## 2022-05-19 ENCOUNTER — Telehealth: Payer: Self-pay

## 2022-05-19 NOTE — Telephone Encounter (Signed)
Disability form with updated dates has been placed in your inbox for signing.

## 2022-05-22 ENCOUNTER — Encounter (HOSPITAL_COMMUNITY): Payer: Self-pay | Admitting: Orthopedic Surgery

## 2022-05-22 DIAGNOSIS — Z0279 Encounter for issue of other medical certificate: Secondary | ICD-10-CM

## 2022-05-22 NOTE — Telephone Encounter (Signed)
Updated FMLA form for surgery on 05/24/22 and leave thereafter has been faxed to fax#[phone].  Received fax confirmation.  Copies have been made for billing, scanning, patient, and myself.  I spoke to patient over phone, who requested his copy be mailed to his home address.  I placed his copy in outgoing mail.

## 2022-05-23 ENCOUNTER — Other Ambulatory Visit: Payer: Self-pay

## 2022-05-23 ENCOUNTER — Encounter (HOSPITAL_COMMUNITY): Payer: Self-pay | Admitting: *Deleted

## 2022-05-23 ENCOUNTER — Other Ambulatory Visit: Payer: Self-pay | Admitting: Radiology

## 2022-05-23 DIAGNOSIS — I6521 Occlusion and stenosis of right carotid artery: Secondary | ICD-10-CM

## 2022-05-23 NOTE — Progress Notes (Signed)
PCP - Dr Viviana Simpler Cardiologist - n/a Neurology - Butler Denmark, NP  Chest x-ray - 03/22/22 EKG - 03/23/22 Stress Test - n/a ECHO - n/a Cardiac Cath - n/a  ICD Pacemaker/Loop - n/a  Sleep Study -  Yes CPAP - uses nightly  Diabetes Type 2 Do not take Jardiance, Glipizide and Metformin on the morning of surgery.  Do not take Jardiance for 72 hours prior to procedure.  (SDW call)   THE NIGHT BEFORE SURGERY, take 21 Units of Lantus Solostar Insulin.       THE MORNING OF SURGERY, take 21 Units of Lantus Solostar Insulin.  If your blood sugar is less than 70 mg/dL, you will need to treat for low blood sugar: Treat a low blood sugar (less than 70 mg/dL) with  cup of clear juice (cranberry or apple), 4 glucose tablets, OR glucose gel. Recheck blood sugar in 15 minutes after treatment (to make sure it is greater than 70 mg/dL). If your blood sugar is not greater than 70 mg/dL on recheck, call [phone] for further instructions.  Aspirin/Blood Thinner Instructions: Per MD, ok to take Plavix and ASA on DOS.  Anesthesia review: Yes  STOP now taking any Aspirin (unless otherwise instructed by your surgeon), Aleve, Naproxen, Ibuprofen, Motrin, Advil, Goody's, BC's, all herbal medications, fish oil, and all vitamins.   Coronavirus Screening Do you have any of the following symptoms:  Cough yes/no: No Fever (>100.32F)  yes/no: No Runny nose yes/no: No Sore throat yes/no: No Difficulty breathing/shortness of breath  yes/no: No  Have you traveled in the last 14 days and where? yes/no: No  Patient verbalized understanding of instructions that were given via phone.

## 2022-05-24 ENCOUNTER — Ambulatory Visit (HOSPITAL_COMMUNITY): Payer: 59 | Admitting: Physician Assistant

## 2022-05-24 ENCOUNTER — Inpatient Hospital Stay (HOSPITAL_COMMUNITY)
Admission: RE | Admit: 2022-05-24 | Discharge: 2022-05-25 | DRG: 036 | Disposition: A | Discharge status: Home / Self Care | Payer: 59 | Attending: Neuroradiology | Admitting: Neuroradiology

## 2022-05-24 ENCOUNTER — Encounter (HOSPITAL_COMMUNITY)
Admission: RE | Disposition: A | Discharge status: Home / Self Care | Payer: Self-pay | Source: Home / Self Care | Attending: Neuroradiology

## 2022-05-24 ENCOUNTER — Encounter (HOSPITAL_COMMUNITY): Payer: Self-pay

## 2022-05-24 ENCOUNTER — Inpatient Hospital Stay (HOSPITAL_COMMUNITY)
Admission: RE | Admit: 2022-05-24 | Discharge: 2022-05-24 | Disposition: A | Discharge status: Home / Self Care | Payer: 59 | Source: Ambulatory Visit | Attending: Neuroradiology | Admitting: Neuroradiology

## 2022-05-24 DIAGNOSIS — I6521 Occlusion and stenosis of right carotid artery: Secondary | ICD-10-CM | POA: Diagnosis present

## 2022-05-24 DIAGNOSIS — Z7982 Long term (current) use of aspirin: Secondary | ICD-10-CM | POA: Diagnosis not present

## 2022-05-24 DIAGNOSIS — Z79899 Other long term (current) drug therapy: Secondary | ICD-10-CM | POA: Diagnosis not present

## 2022-05-24 DIAGNOSIS — Z794 Long term (current) use of insulin: Secondary | ICD-10-CM

## 2022-05-24 DIAGNOSIS — E785 Hyperlipidemia, unspecified: Secondary | ICD-10-CM | POA: Diagnosis present

## 2022-05-24 DIAGNOSIS — Z7722 Contact with and (suspected) exposure to environmental tobacco smoke (acute) (chronic): Secondary | ICD-10-CM

## 2022-05-24 DIAGNOSIS — K219 Gastro-esophageal reflux disease without esophagitis: Secondary | ICD-10-CM | POA: Diagnosis present

## 2022-05-24 DIAGNOSIS — Z8249 Family history of ischemic heart disease and other diseases of the circulatory system: Secondary | ICD-10-CM

## 2022-05-24 DIAGNOSIS — G4730 Sleep apnea, unspecified: Secondary | ICD-10-CM | POA: Diagnosis present

## 2022-05-24 DIAGNOSIS — Z7902 Long term (current) use of antithrombotics/antiplatelets: Secondary | ICD-10-CM | POA: Diagnosis not present

## 2022-05-24 DIAGNOSIS — E1151 Type 2 diabetes mellitus with diabetic peripheral angiopathy without gangrene: Secondary | ICD-10-CM | POA: Diagnosis present

## 2022-05-24 DIAGNOSIS — Z8673 Personal history of transient ischemic attack (TIA), and cerebral infarction without residual deficits: Secondary | ICD-10-CM | POA: Diagnosis not present

## 2022-05-24 DIAGNOSIS — Z888 Allergy status to other drugs, medicaments and biological substances status: Secondary | ICD-10-CM

## 2022-05-24 DIAGNOSIS — Z7984 Long term (current) use of oral hypoglycemic drugs: Secondary | ICD-10-CM

## 2022-05-24 DIAGNOSIS — I6503 Occlusion and stenosis of bilateral vertebral arteries: Secondary | ICD-10-CM | POA: Diagnosis present

## 2022-05-24 DIAGNOSIS — I10 Essential (primary) hypertension: Secondary | ICD-10-CM

## 2022-05-24 DIAGNOSIS — I6523 Occlusion and stenosis of bilateral carotid arteries: Secondary | ICD-10-CM | POA: Diagnosis present

## 2022-05-24 DIAGNOSIS — Z885 Allergy status to narcotic agent status: Secondary | ICD-10-CM

## 2022-05-24 DIAGNOSIS — G43909 Migraine, unspecified, not intractable, without status migrainosus: Secondary | ICD-10-CM | POA: Diagnosis present

## 2022-05-24 HISTORY — PX: IR US GUIDE VASC ACCESS RIGHT: IMG2390

## 2022-05-24 HISTORY — PX: RADIOLOGY WITH ANESTHESIA: SHX6223

## 2022-05-24 HISTORY — PX: IR ANGIOGRAM FOLLOW UP STUDY: IMG697

## 2022-05-24 HISTORY — PX: IR ANGIO VERTEBRAL SEL SUBCLAVIAN INNOMINATE UNI L MOD SED: IMG5364

## 2022-05-24 HISTORY — PX: IR INTRAVSC STENT CERV CAROTID W/EMB-PROT MOD SED INCL ANGIO: IMG2303

## 2022-05-24 HISTORY — PX: IR ANGIO INTRA EXTRACRAN SEL COM CAROTID INNOMINATE BILAT MOD SED: IMG5360

## 2022-05-24 LAB — POCT ACTIVATED CLOTTING TIME
Activated Clotting Time: 131 seconds
Activated Clotting Time: 233 seconds

## 2022-05-24 LAB — PROTIME-INR
INR: 1.1 (ref 0.8–1.2)
Prothrombin Time: 14.2 seconds (ref 11.4–15.2)

## 2022-05-24 LAB — CBC WITH DIFFERENTIAL/PLATELET
Abs Immature Granulocytes: 0.02 10*3/uL (ref 0.00–0.07)
Basophils Absolute: 0 10*3/uL (ref 0.0–0.1)
Basophils Relative: 1 %
Eosinophils Absolute: 0.2 10*3/uL (ref 0.0–0.5)
Eosinophils Relative: 3 %
HCT: 42.1 % (ref 39.0–52.0)
Hemoglobin: 14.7 g/dL (ref 13.0–17.0)
Immature Granulocytes: 0 %
Lymphocytes Relative: 26 %
Lymphs Abs: 1.7 10*3/uL (ref 0.7–4.0)
MCH: 32.6 pg (ref 26.0–34.0)
MCHC: 34.9 g/dL (ref 30.0–36.0)
MCV: 93.3 fL (ref 80.0–100.0)
Monocytes Absolute: 0.6 10*3/uL (ref 0.1–1.0)
Monocytes Relative: 9 %
Neutro Abs: 4.1 10*3/uL (ref 1.7–7.7)
Neutrophils Relative %: 61 %
Platelets: 227 10*3/uL (ref 150–400)
RBC: 4.51 MIL/uL (ref 4.22–5.81)
RDW: 12.3 % (ref 11.5–15.5)
WBC: 6.6 10*3/uL (ref 4.0–10.5)
nRBC: 0 % (ref 0.0–0.2)

## 2022-05-24 LAB — GLUCOSE, CAPILLARY
Glucose-Capillary: 108 mg/dL — ABNORMAL HIGH (ref 70–99)
Glucose-Capillary: 88 mg/dL (ref 70–99)
Glucose-Capillary: 72 mg/dL (ref 70–99)
Glucose-Capillary: 136 mg/dL — ABNORMAL HIGH (ref 70–99)

## 2022-05-24 LAB — BASIC METABOLIC PANEL
Anion gap: 10 (ref 5–15)
BUN: 15 mg/dL (ref 8–23)
CO2: 23 mmol/L (ref 22–32)
Calcium: 9.4 mg/dL (ref 8.9–10.3)
Chloride: 105 mmol/L (ref 98–111)
Creatinine, Ser: 1.16 mg/dL (ref 0.61–1.24)
GFR, Estimated: >60 mL/min (ref >60)
Glucose, Bld: 133 mg/dL — ABNORMAL HIGH (ref 70–99)
Potassium: 3.9 mmol/L (ref 3.5–5.1)
Sodium: 138 mmol/L (ref 135–145)

## 2022-05-24 SURGERY — IR WITH ANESTHESIA
Anesthesia: Monitor Anesthesia Care

## 2022-05-24 MED ORDER — HEPARIN SODIUM (PORCINE) 1000 UNIT/ML IJ SOLN
INTRAMUSCULAR | Status: DC | PRN
  Administered 2022-05-24: 5000 [IU] via INTRAVENOUS

## 2022-05-24 MED ORDER — ASPIRIN 81 MG PO CHEW
81.0000 mg | CHEWABLE_TABLET | Freq: Every day | ORAL | Status: DC
  Administered 2022-05-25: 81 mg via ORAL
  Filled 2022-05-24: qty 1

## 2022-05-24 MED ORDER — LIDOCAINE HCL 1 % IJ SOLN
INTRAMUSCULAR | Status: AC
  Filled 2022-05-24: qty 20

## 2022-05-24 MED ORDER — FENTANYL CITRATE (PF) 250 MCG/5ML IJ SOLN
INTRAMUSCULAR | Status: DC | PRN
  Administered 2022-05-24: 50 ug via INTRAVENOUS
  Administered 2022-05-24 (×2): 25 ug via INTRAVENOUS

## 2022-05-24 MED ORDER — CHLORHEXIDINE GLUCONATE 0.12 % MT SOLN
OROMUCOSAL | Status: AC
  Administered 2022-05-24: 15 mL
  Filled 2022-05-24: qty 15

## 2022-05-24 MED ORDER — ASPIRIN 81 MG PO TBEC
81.0000 mg | DELAYED_RELEASE_TABLET | Freq: Every day | ORAL | Status: DC
  Administered 2022-05-24: 81 mg via ORAL

## 2022-05-24 MED ORDER — ONDANSETRON HCL 4 MG/2ML IJ SOLN: 4.0000 mg | Freq: Four times a day (QID) | INTRAMUSCULAR | Status: DC | PRN

## 2022-05-24 MED ORDER — ONDANSETRON HCL 4 MG/2ML IJ SOLN
INTRAMUSCULAR | Status: DC | PRN
  Administered 2022-05-24: 4 mg via INTRAVENOUS

## 2022-05-24 MED ORDER — LIDOCAINE 2% (20 MG/ML) 5 ML SYRINGE
INTRAMUSCULAR | Status: DC | PRN
  Administered 2022-05-24 (×2): 20 mg via INTRAVENOUS

## 2022-05-24 MED ORDER — ASPIRIN 81 MG PO TBEC
DELAYED_RELEASE_TABLET | ORAL | Status: AC
  Filled 2022-05-24: qty 2

## 2022-05-24 MED ORDER — ASPIRIN 81 MG PO TBEC
DELAYED_RELEASE_TABLET | ORAL | Status: AC
  Filled 2022-05-24: qty 3

## 2022-05-24 MED ORDER — ASPIRIN 81 MG PO TBEC
81.0000 mg | DELAYED_RELEASE_TABLET | Freq: Once | ORAL | Status: AC
  Administered 2022-05-24: 81 mg via ORAL

## 2022-05-24 MED ORDER — CLOPIDOGREL BISULFATE 75 MG PO TABS: 75.0000 mg | ORAL_TABLET | Freq: Every day | ORAL | Status: DC

## 2022-05-24 MED ORDER — CLEVIDIPINE BUTYRATE 0.5 MG/ML IV EMUL: 0.0000 mg/h | INTRAVENOUS | Status: DC

## 2022-05-24 MED ORDER — ASPIRIN 325 MG PO TBEC: 325.0000 mg | DELAYED_RELEASE_TABLET | ORAL | Status: DC

## 2022-05-24 MED ORDER — ACETAMINOPHEN 650 MG RE SUPP: 650.0000 mg | RECTAL | Status: DC | PRN

## 2022-05-24 MED ORDER — SODIUM CHLORIDE 0.9 % IV BOLUS
250.0000 mL | Freq: Once | INTRAVENOUS | Status: AC
  Administered 2022-05-24: 250 mL via INTRAVENOUS

## 2022-05-24 MED ORDER — MIDAZOLAM HCL 2 MG/2ML IJ SOLN
INTRAMUSCULAR | Status: DC | PRN
  Administered 2022-05-24: 2 mg via INTRAVENOUS

## 2022-05-24 MED ORDER — ACETAMINOPHEN 160 MG/5ML PO SOLN: 650.0000 mg | ORAL | Status: DC | PRN

## 2022-05-24 MED ORDER — SODIUM CHLORIDE 0.9 % IV SOLN: INTRAVENOUS | Status: DC

## 2022-05-24 MED ORDER — INSULIN ASPART 100 UNIT/ML IJ SOLN: 0.0000 [IU] | INTRAMUSCULAR | Status: DC | PRN

## 2022-05-24 MED ORDER — CEFAZOLIN SODIUM-DEXTROSE 2-4 GM/100ML-% IV SOLN
INTRAVENOUS | Status: AC
  Filled 2022-05-24: qty 100

## 2022-05-24 MED ORDER — CLOPIDOGREL BISULFATE 75 MG PO TABS: 75.0000 mg | ORAL_TABLET | ORAL | Status: DC

## 2022-05-24 MED ORDER — NIMODIPINE 30 MG PO CAPS: 0.0000 mg | ORAL_CAPSULE | ORAL | Status: DC

## 2022-05-24 MED ORDER — IOHEXOL 300 MG/ML  SOLN
150.0000 mL | Freq: Once | INTRAMUSCULAR | Status: AC | PRN
  Administered 2022-05-24: 112 mL via INTRA_ARTERIAL

## 2022-05-24 MED ORDER — VERAPAMIL HCL 2.5 MG/ML IV SOLN
INTRAVENOUS | Status: AC | PRN
  Administered 2022-05-24: 5 mg via INTRA_ARTERIAL

## 2022-05-24 MED ORDER — INSULIN ASPART 100 UNIT/ML IJ SOLN: 0.0000 [IU] | Freq: Every day | INTRAMUSCULAR | Status: DC

## 2022-05-24 MED ORDER — PROPOFOL 500 MG/50ML IV EMUL
INTRAVENOUS | Status: DC | PRN
  Administered 2022-05-24: 50 ug/kg/min via INTRAVENOUS

## 2022-05-24 MED ORDER — CLOPIDOGREL BISULFATE 75 MG PO TABS
75.0000 mg | ORAL_TABLET | Freq: Every day | ORAL | Status: DC
  Administered 2022-05-25: 75 mg via ORAL
  Filled 2022-05-24: qty 1

## 2022-05-24 MED ORDER — INSULIN ASPART 100 UNIT/ML IJ SOLN: 0.0000 [IU] | Freq: Three times a day (TID) | INTRAMUSCULAR | Status: DC

## 2022-05-24 MED ORDER — CEFAZOLIN SODIUM-DEXTROSE 2-4 GM/100ML-% IV SOLN: 2.0000 g | INTRAVENOUS | Status: DC

## 2022-05-24 MED ORDER — CEFAZOLIN SODIUM-DEXTROSE 2-3 GM-%(50ML) IV SOLR
INTRAVENOUS | Status: DC | PRN
  Administered 2022-05-24: 2 g via INTRAVENOUS

## 2022-05-24 MED ORDER — ASPIRIN 81 MG PO CHEW: 81.0000 mg | CHEWABLE_TABLET | Freq: Every day | ORAL | Status: DC

## 2022-05-24 MED ORDER — LACTATED RINGERS IV SOLN: INTRAVENOUS | Status: DC | PRN

## 2022-05-24 MED ORDER — ACETAMINOPHEN 325 MG PO TABS
650.0000 mg | ORAL_TABLET | ORAL | Status: DC | PRN
  Administered 2022-05-24: 650 mg via ORAL
  Filled 2022-05-24: qty 2

## 2022-05-24 MED ORDER — VERAPAMIL HCL 2.5 MG/ML IV SOLN
INTRAVENOUS | Status: AC
  Filled 2022-05-24: qty 2

## 2022-05-24 MED ORDER — CHLORHEXIDINE GLUCONATE CLOTH 2 % EX PADS: 6.0000 | MEDICATED_PAD | Freq: Every day | CUTANEOUS | Status: DC

## 2022-05-24 MED ORDER — PHENYLEPHRINE HCL-NACL 20-0.9 MG/250ML-% IV SOLN
INTRAVENOUS | Status: DC | PRN
  Administered 2022-05-24: 10 ug/min via INTRAVENOUS

## 2022-05-24 NOTE — H&P (Signed)
Chief Complaint: Patient was seen in consultation today for right carotid artery stenosis.  Referring Physician(s): Antony Contras, MD  Supervising Physician: Pedro Earls  Patient Status: [facility] - Out-[facility]  History of Present Illness: [facility] is a 61 y.o. male with a past medical history significant for DM, HTN, HLD, migraine headaches, sleep apnea who presents today for right carotid artery stenosis angioplasty/possible stent placement. Patent presented to the ED at [facility] on 8.16.23  One episode of unsteady gait, diaphoresis and dizziness that self resolved.  Code stroke was called at the patient was transferred to [facility] for further evaluation. Cerebral angiogram performed on 8.17.23 showed severe stenosis in the left vertebral artery, right carotid bifurcation, left carotid bifurcation and right vertebral artery. He underwent stenting of the left vertebral artery on 08.18.23 for symptomatic vertebrobasilar insufficiency without incident. He did well post procedure without complications. He returned on 04/17/2022 for elective left carotid angioplasty and stenting. Procedure was uneventful and he was discharged home. He was seen by Dr Tennis Must Sindy Messing on 05/08/22 to discuss right carotid artery stenosis intervention and after thorough discussion decision was made to proceed.   [name] denies any complaints this morning, he states he has been "feeling as well as I can." He is agreeable to proceed as planned. He took Plavix 75, ASA 81, metformin and insulin this morning.   Past Medical History:  Diagnosis Date   Allergic rhinitis    Arm fracture, left as a child   DeQuervain's disease (tenosynovitis)    right wrist   Diabetes mellitus    type 2   Erectile dysfunction    GERD (gastroesophageal reflux disease)    Hyperlipidemia    Hypertension    Migraines    Peripheral vascular disease ([place])    Sleep apnea    uses nightly   Stroke ([facility]) 03/21/2022    Past  Surgical History:  Procedure Laterality Date   Cardiolite  5/06   Negative EF 66%   CARPAL TUNNEL RELEASE  9/03   left    IR ANGIO INTRA EXTRACRAN SEL COM CAROTID INNOMINATE BILAT MOD SED  03/23/2022   IR ANGIO INTRA EXTRACRAN SEL COM CAROTID INNOMINATE UNI L MOD SED  04/17/2022   IR ANGIO VERTEBRAL SEL SUBCLAVIAN INNOMINATE BILAT MOD SED  03/23/2022   IR ANGIO VERTEBRAL SEL SUBCLAVIAN INNOMINATE UNI L MOD SED  04/17/2022   IR ANGIO VERTEBRAL SEL VERTEBRAL UNI L MOD SED  03/29/2022   IR ANGIOGRAM EXTREMITY LEFT  03/29/2022   IR INTRAVSC STENT CERV CAROTID W/EMB-PROT MOD SED INCL ANGIO  04/17/2022   IR TRANSCATH EXCRAN VERT OR CAR A STENT  03/24/2022   IR US GUIDE VASC ACCESS RIGHT  03/23/2022   IR US GUIDE VASC ACCESS RIGHT  03/24/2022   IR US GUIDE VASC ACCESS RIGHT  04/17/2022   RADIOLOGY WITH ANESTHESIA Left 03/24/2022   Procedure: IR WITH ANESTHESIA;  Surgeon: Pedro Earls, MD;  Location: [facility];  Service: Radiology;  Laterality: Left;   RADIOLOGY WITH ANESTHESIA N/A 04/17/2022   Procedure: LEFT CAROTID STENT;  Surgeon: Pedro Earls, MD;  Location: [facility];  Service: Radiology;  Laterality: N/A;   WRIST SURGERY  1/04   DeQuervain release   WRIST SURGERY  1/08   ORIF l wrist DVR plate screwhead autologous    Allergies: Bisoprolol-hydrochlorothiazide and Codeine  Medications: Prior to Admission medications   Medication Sig Start Date End Date Taking? Authorizing Provider  aspirin EC  81 MG tablet Take 81 mg by mouth daily.   Yes [provider]  atorvastatin (LIPITOR) 80 MG tablet Take 1 tablet (80 mg total) by mouth daily. 03/23/22 06/21/22 Yes Agbata, Tochukwu, MD  clopidogrel (PLAVIX) 75 MG tablet TAKE 1 TABLET BY MOUTH DAILY 04/25/22  Yes Viviana Simpler I, MD  empagliflozin (JARDIANCE) 25 MG TABS tablet Take 25 mg by mouth daily.   Yes [provider]  glipiZIDE (GLUCOTROL XL) 5 MG 24 hr tablet Take 5 mg by mouth in the morning and at  bedtime.   Yes [provider]  ibuprofen (ADVIL) 200 MG tablet Take 400-600 mg by mouth every 8 (eight) hours as needed for headache.   Yes [provider]  LANTUS SOLOSTAR 100 UNIT/ML Solostar Pen INJECT SUBCUTANEOUSLY 90  UNITS AT BEDTIME Patient taking differently: Inject 42 Units into the skin 2 (two) times daily. 03/01/22  Yes Viviana Simpler I, MD  lisinopril (ZESTRIL) 20 MG tablet Take 20 mg by mouth daily.   Yes [provider]  metFORMIN (GLUCOPHAGE) 1000 MG tablet Take 1,000 mg by mouth 2 (two) times daily with a meal.   Yes [provider]  pantoprazole (PROTONIX) 40 MG tablet Take 1 tablet (40 mg total) by mouth daily. 04/06/22  Yes [name], MD  Semaglutide, 2 MG/DOSE, (OZEMPIC, 2 MG/DOSE,) 8 MG/3ML SOPN Inject 2 mg into the skin every Saturday.   Yes [provider]  Ubrogepant (UBRELVY) 100 MG TABS Take 100 mg by mouth as needed (Take 1 tablet at onset of headache, may repeat in 2 hours if needed, max is 200 mg in 24 hours). 04/26/22  Yes Suzzanne Cloud, NP     Family History  Problem Relation Age of Onset   Hypertension Father    Lymphoma Father        non-hodgkins   Uterine cancer Maternal Grandmother    Hypertension Paternal Grandfather    Lung cancer Paternal Aunt    Coronary artery disease Paternal Uncle    Colon cancer Unknown        in 1 relative (?aunt)    Social History   Socioeconomic History   Marital status: Married    Spouse name: Not on file   Number of children: Not on file   Years of education: Not on file   Highest education level: Not on file  Occupational History   Occupation: Buyer, 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  Tobacco Use   Smoking status: Never    Passive exposure: Past   Smokeless tobacco: Never  Vaping Use   Vaping Use: Never used  Substance and Sexual Activity   Alcohol use: Not Currently    Comment: rare   Drug use: No   Sexual activity: Yes     Partners: Female  Other Topics Concern   Not on file  Social History Narrative   Regular exercise: light to moderate   Caffeine use: coffee daily   Social Determinants of Radio broadcast assistant Strain: Not on file  Food Insecurity: Not on file  Transportation Needs: Not on file  Physical Activity: Not on file  Stress: Not on file  Social Connections: Not on file     Review of Systems: A 12 point ROS discussed and pertinent positives are indicated in the HPI above.  All other systems are negative.  Review of Systems  Constitutional:  Negative for chills and fever.  Respiratory:  Negative for  cough and shortness of breath.   Cardiovascular:  Negative for chest pain.  Gastrointestinal:  Negative for abdominal pain, diarrhea, nausea and vomiting.  Musculoskeletal:  Negative for back pain.  Neurological:  Positive for dizziness. Negative for seizures, facial asymmetry, weakness, light-headedness, numbness and headaches.  Psychiatric/Behavioral:  Negative for confusion.     Vital Signs: BP 116/78   Pulse 96   Temp 98.3 F (36.8 C) (Oral)   Resp 20   Ht '5\' 6"'$  (1.676 m)   Wt 176 lb (79.8 kg)   SpO2 95%   BMI 28.41 kg/m   Physical Exam Vitals reviewed.  Constitutional:      General: He is not in acute distress. HENT:     Head: Normocephalic.     Mouth/Throat:     Mouth: Mucous membranes are moist.     Pharynx: Oropharynx is clear. No oropharyngeal exudate or posterior oropharyngeal erythema.  Cardiovascular:     Rate and Rhythm: Normal rate and regular rhythm.  Pulmonary:     Effort: Pulmonary effort is normal.     Breath sounds: Normal breath sounds.  Abdominal:     General: There is no distension.     Palpations: Abdomen is soft.     Tenderness: There is no abdominal tenderness.  Skin:    General: Skin is warm and dry.  Neurological:     Mental Status: He is alert and oriented to person, place, and time.  Psychiatric:        Mood and Affect: Mood normal.         Behavior: Behavior normal.        Thought Content: Thought content normal.        Judgment: Judgment normal.      MD Evaluation Airway: WNL Heart: WNL Abdomen: WNL Chest/ Lungs: WNL ASA  Classification: Per MD or Designee Mallampati/Airway Score: Two   Imaging: No results found.  Labs:  CBC: Recent Labs    03/22/22 2220 03/23/22 0240 04/17/22 0709 05/24/22 0703  WBC 7.0 8.1 7.4 6.6  HGB 14.7 14.2 14.9 14.7  HCT 40.1 38.9* 41.9 42.1  PLT 209 194 222 227    COAGS: Recent Labs    03/21/22 1929 04/17/22 0709 05/24/22 0703  INR 1.1 1.1 1.1  APTT 28  --   --     BMP: Recent Labs    03/22/22 2220 03/23/22 0240 04/17/22 0709 05/24/22 0703  NA 129* 130* 132* 138  K 4.0 3.9 4.0 3.9  CL 98 100 99 105  CO2 '23 22 23 23  '$ GLUCOSE 171* 100* 129* 133*  BUN '11 10 18 15  '$ CALCIUM 8.5* 8.5* 8.7* 9.4  CREATININE 1.03 0.99 1.14 1.16  GFRNONAA >60 >60 >60 >60    LIVER FUNCTION TESTS: Recent Labs    11/01/21 1143 03/21/22 1929 03/22/22 2220 03/23/22 0240  BILITOT 0.7 1.8* 1.8* 1.5*  AST 40 36 33 30  ALT 48* 41 45* 40  ALKPHOS 77 69 60 57  PROT 6.7 7.4 6.6 6.3*  ALBUMIN 4.6 4.3 3.6 3.4*    TUMOR MARKERS: No results for input(s): "AFPTM", "CEA", "CA199", "CHROMGRNA" in the last 8760 hours.  Assessment and Plan:  61 y/o M with history of severe stenosis in the left carotid bifurcation, left vertebral artery s/p stenting 04/17/22, right vertebral artery and right carotid bifurcation who presents today for cerebral angiogram with angioplasty and possible right carotid artery stent placement.   Risks and benefits of cerebral arteriogram with intervention were discussed with  the patient including, but not limited to bleeding, infection, vascular injury, contrast induced renal failure, stroke, reperfusion hemorrhage, or even death.  This interventional procedure involves the use of X-rays and because of the nature of the planned procedure, it is possible  that we will have prolonged use of X-ray fluoroscopy. Potential radiation risks to you include (but are not limited to) the following: - A slightly elevated risk for cancer  several years later in life. This risk is typically less than 0.5% percent. This risk is low in comparison to the normal incidence of human cancer, which is 33% for women and 50% for men according to the [employer]. - Radiation induced injury can include skin redness, resembling a rash, tissue breakdown / ulcers and hair loss (which can be temporary or permanent).  The likelihood of either of these occurring depends on the difficulty of the procedure and whether you are sensitive to radiation due to previous procedures, disease, or genetic conditions.  IF your procedure requires a prolonged use of radiation, you will be notified and given written instructions for further action.  It is your responsibility to monitor the irradiated area for the 2 weeks following the procedure and to notify your physician if you are concerned that you have suffered a radiation induced injury.    All of the patient's questions were answered, patient is agreeable to proceed.  Consent signed and in chart.  Thank you for this interesting consult.  I greatly enjoyed meeting AMRON GUERRETTE and look forward to participating in their care.  A copy of this report was sent to the requesting provider on this date.  Electronically Signed: Joaquim Nam, PA-C 05/24/2022, 8:27 AM   I spent a total of  15 Minutes in face to face in clinical consultation, greater than 50% of which was counseling/coordinating care for right carotid artery stenosis.

## 2022-05-24 NOTE — Progress Notes (Signed)
Interventional Radiology Brief Note:  Patient assessed at bedside alongside Dr. Karenann Cai. He is alert, oriented, without complaint after his procedure this AM.  Wife also at bedside.  All questions are answered.   He is ordered sliding scale insulin for overnight coverage.  May continue Carb Mod diet at this time.   He requests CPAP machine.  RN will discuss with family about bringing machine from home vs. Inpatient device.   Plan for observation overnight with discharge tomorrow based on clinical status.   MRA Neck in 4 months with follow-up with Dr. Karenann Cai.  Continue home aspirin and Plavix until this time.   Brynda Greathouse, MS RD PA-C

## 2022-05-24 NOTE — Procedures (Signed)
INTERVENTIONAL NEURORADIOLOGY BRIEF POSTPROCEDURE NOTE   ULTRASOUND-GUIDED VASCULAR ACCESS  DIAGNOSTIC CEREBRAL ANGIOGRAM  RIGHT CAROTID ANGIOPLASTY AND STENTING WITH CEREBRAL PROTECTION DEVICE    Attending: Dr. Pedro Earls   Diagnosis: Right carotid stenosis    Access site: Right common femoral artery    Access closure: 68F Angio-seal    Anesthesia: Monitored anesthesia care    Medication used: Refer to anesthesia documentation.   Complications: None.    Estimated blood loss: 30 mL    Specimen: None.    Findings: Redemonstrated high-grade stenosis (~90%) at the right carotid bulb.  Angioplasty performed with a 4 mm balloon followed by placement of a 8-6 x 40 mm XACT carotid stent across the area of stenosis followed by in stent angioplasty with a 6 mm balloon with the use of a cerebral protection device.  No evidence of thromboembolic complication.    The patient tolerated the procedure well without incident or complication and is in stable condition.    PLAN: - bed rest x6 hours post femoral access  - SBP 95 x 140 mmHg

## 2022-05-24 NOTE — Anesthesia Procedure Notes (Deleted)
Epidural

## 2022-05-24 NOTE — Transfer of Care (Signed)
Immediate Anesthesia Transfer of Care Note  Patient: [facility]  Procedure(s) Performed: Right carotid angioplasty with possible stenting  Patient Location: PACU  Anesthesia Type:MAC  Level of Consciousness: awake, alert  and oriented  Airway & Oxygen Therapy: Patient Spontanous Breathing  Post-op Assessment: Post -op Vital signs reviewed and stable  Post vital signs: stable  Last Vitals:  Vitals Value Taken Time  BP    Temp    Pulse 81 05/24/22 1044  Resp 16 05/24/22 1044  SpO2 93 % 05/24/22 1044  Vitals shown include unvalidated device data.  Last Pain:  Vitals:   05/24/22 0705  TempSrc:   PainSc: 0-No pain         Complications: No notable events documented.

## 2022-05-24 NOTE — Anesthesia Procedure Notes (Signed)
Arterial Line Insertion Start/End10/18/2023 8:00 AM, 05/24/2022 8:02 AM Performed by: Lavell Luster, CRNA, CRNA  Preanesthetic checklist: patient identified, IV checked, site marked, risks and benefits discussed, surgical consent, monitors and equipment checked and pre-op evaluation Lidocaine 1% used for infiltration Left, radial was placed Catheter size: 20 G Hand hygiene performed , maximum sterile barriers used  and Seldinger technique used Allen's test indicative of satisfactory collateral circulation Attempts: 1 Procedure performed without using ultrasound guided technique. Following insertion, Biopatch and dressing applied. Post procedure assessment: normal  Patient tolerated the procedure well with no immediate complications.

## 2022-05-24 NOTE — Anesthesia Procedure Notes (Addendum)
Procedure Name: MAC Date/Time: 05/24/2022 9:24 AM  Performed by: Lavell Luster, CRNAPre-anesthesia Checklist: Patient identified, Emergency Drugs available, Suction available, Patient being monitored and Timeout performed Patient Re-evaluated:Patient Re-evaluated prior to induction Oxygen Delivery Method: Simple face mask Preoxygenation: Pre-oxygenation with 100% oxygen Induction Type: IV induction Placement Confirmation: breath sounds checked- equal and bilateral and positive ETCO2 Dental Injury: Teeth and Oropharynx as per pre-operative assessment

## 2022-05-24 NOTE — Anesthesia Preprocedure Evaluation (Addendum)
Anesthesia Evaluation  Patient identified by MRN, date of birth, ID band Patient awake    Reviewed: Allergy & Precautions, H&P , NPO status , Patient's Chart, lab work & pertinent test results  Airway Mallampati: III  TM Distance: >3 FB Neck ROM: Full    Dental  (+) Teeth Intact, Dental Advisory Given   Pulmonary sleep apnea    breath sounds clear to auscultation       Cardiovascular hypertension, Pt. on medications + Peripheral Vascular Disease   Rhythm:Regular Rate:Normal     Neuro/Psych  Headaches TIA   GI/Hepatic Neg liver ROS,GERD  Medicated,,  Endo/Other  diabetes, Type 2, Oral Hypoglycemic Agents    Renal/GU negative Renal ROS     Musculoskeletal negative musculoskeletal ROS (+)    Abdominal   Peds  Hematology negative hematology ROS (+)   Anesthesia Other Findings   Reproductive/Obstetrics                             Anesthesia Physical Anesthesia Plan  ASA: 3  Anesthesia Plan: MAC   Post-op Pain Management:    Induction: Intravenous  PONV Risk Score and Plan: 3 and Ondansetron, Propofol infusion and Midazolam  Airway Management Planned: Simple Face Mask and Nasal Cannula  Additional Equipment: Arterial line  Intra-op Plan:   Post-operative Plan:   Informed Consent: I have reviewed the patients History and Physical, chart, labs and discussed the procedure including the risks, benefits and alternatives for the proposed anesthesia with the patient or authorized representative who has indicated his/her understanding and acceptance.       Plan Discussed with: CRNA and Anesthesiologist  Anesthesia Plan Comments:         Anesthesia Quick Evaluation

## 2022-05-24 NOTE — Sedation Documentation (Addendum)
Pt transported to PACU via stretcher c CRNA & RN x2. Bedside report given to [facility]. NAD noted from Pt.

## 2022-05-25 ENCOUNTER — Encounter (HOSPITAL_COMMUNITY): Payer: Self-pay | Admitting: Neuroradiology

## 2022-05-25 LAB — GLUCOSE, CAPILLARY: Glucose-Capillary: 227 mg/dL — ABNORMAL HIGH (ref 70–99)

## 2022-05-25 NOTE — Anesthesia Postprocedure Evaluation (Signed)
Anesthesia Post Note  Patient: [facility]  Procedure(s) Performed: Right carotid angioplasty with possible stenting     Patient location during evaluation: PACU Anesthesia Type: General Level of consciousness: awake and alert Pain management: pain level controlled Vital Signs Assessment: post-procedure vital signs reviewed and stable Respiratory status: spontaneous breathing, nonlabored ventilation, respiratory function stable and patient connected to nasal cannula oxygen Cardiovascular status: stable and blood pressure returned to baseline Postop Assessment: no apparent nausea or vomiting Anesthetic complications: no   No notable events documented.  Last Vitals:  Vitals:   05/25/22 0900 05/25/22 1000  BP: 109/69 118/70  Pulse: 87 82  Resp: 19 20  Temp:    SpO2: 94% 94%    Last Pain:  Vitals:   05/25/22 0800  TempSrc: Axillary  PainSc: 0-No pain                 Keatyn Jawad

## 2022-05-25 NOTE — Progress Notes (Signed)
Pt d/c to home by car with family. Assessment stable. D/C instructions reviewed and all questions answered. 

## 2022-05-25 NOTE — Discharge Summary (Addendum)
Patient ID: [facility] MRN: [id number] DOB/AGE: September 11, 1960 61 y.o.  Admit date: 05/24/2022 Discharge date: 05/25/2022  Supervising Physician: Pedro Earls  Patient Status: [facility] - In-pt  Admission Diagnoses:  Right carotid artery stenosis  Discharge Diagnoses:  Principal Problem:   Stenosis of right carotid artery   Discharged Condition: good  Hospital Course:  [facility] is a 61 y.o. male with a past medical history significant for DM, HTN, HLD, migraine headaches, sleep apnea admitted for right carotid artery stenosis angioplasty and stent placement. He underwent successful procedure yesterday with  de Sindy Messing and was admitted overnight for observation.  He has recovered well from his procedure and denies concerns or complaints this AM.  He has been able to tolerate POs.  He has been OOB and is ambulating in his room.  He has voided on his own.   His procedure site is intact, clean, soft.  No neurological deficits. His wife is also available at bedside.  All discharge instructions reviewed by Dr. Karenann Cai.  They understand schedulers will call to arrange follow-up MRA Neck in 4 months (6 months from the time of his first procedure in August).  He is aware to continue aspirin '81mg'$  daily, Plavix '75mg'$  daily for now.   Patient stable for discharge home.  Orders in place.   Discharge Exam: Blood pressure 118/70, pulse 82, temperature 98.3 F (36.8 C), temperature source Axillary, resp. rate 20, height '5\' 6"'$  (1.676 m), weight 176 lb (79.8 kg), SpO2 94 %. General appearance: alert, cooperative, and no distress Resp: clear to auscultation bilaterally Cardio: regular rate and rhythm, S1, S2 normal, no murmur, click, rub or gallop GI: soft, non-tender; bowel sounds normal; no masses,  no organomegaly Incision/Wound: procedure site intact, clean, and dry.  No evidence of bruising or pseudoaneurysm.   Neuro: no focal deficits.   Disposition:  Discharge disposition: 01-Home or Self Care       Discharge Instructions     Diet - low sodium heart healthy   Complete by: As directed    Discharge instructions   Complete by: As directed    Increase activity slowly.   No driving for 1-2 weeks.  Schedulers will contact you with date of imaging.  Continue Plavix 75 mg daily, aspirin '81mg'$  daily.   Increase activity slowly   Complete by: As directed    Remove dressing in 24 hours   Complete by: As directed       Allergies as of 05/25/2022       Reactions   Bisoprolol-hydrochlorothiazide Other (See Comments)    E.D. Unknown per Pt   Codeine Itching        Medication List     TAKE these medications    aspirin EC 81 MG tablet Take 81 mg by mouth daily.   atorvastatin 80 MG tablet Commonly known as: LIPITOR Take 1 tablet (80 mg total) by mouth daily.   clopidogrel 75 MG tablet Commonly known as: PLAVIX TAKE 1 TABLET BY MOUTH DAILY   empagliflozin 25 MG Tabs tablet Commonly known as: JARDIANCE Take 25 mg by mouth daily.   glipiZIDE 5 MG 24 hr tablet Commonly known as: GLUCOTROL XL Take 5 mg by mouth in the morning and at bedtime.   ibuprofen 200 MG tablet Commonly known as: ADVIL Take 400-600 mg by mouth every 8 (eight) hours as needed for headache.   Lantus SoloStar 100 UNIT/ML Solostar Pen Generic drug: insulin glargine INJECT SUBCUTANEOUSLY 90  UNITS AT BEDTIME What changed: See the new instructions.   lisinopril 20 MG tablet Commonly known as: ZESTRIL Take 20 mg by mouth daily.   metFORMIN 1000 MG tablet Commonly known as: GLUCOPHAGE Take 1,000 mg by mouth 2 (two) times daily with a meal.   Ozempic (2 MG/DOSE) 8 MG/3ML Sopn Generic drug: Semaglutide (2 MG/DOSE) Inject 2 mg into the skin every Saturday.   pantoprazole 40 MG tablet Commonly known as: PROTONIX Take 1 tablet (40 mg total) by mouth daily.   Ubrelvy 100 MG Tabs Generic drug: Ubrogepant Take 100 mg by mouth as needed (Take 1  tablet at onset of headache, may repeat in 2 hours if needed, max is 200 mg in 24 hours).        Follow-up Information     de Rosario Jacks, MD Follow up.   Specialties: Radiology, Interventional Radiology Why: Schedulers will contact you with date and time of appointment. Contact information: [address] [place] [address] [phone]                  Electronically Signed: Docia Barrier, PA 05/25/2022, 10:53 AM   I have spent Greater Than 30 Minutes discharging [facility].

## 2022-05-26 ENCOUNTER — Telehealth: Payer: Self-pay

## 2022-05-26 NOTE — Telephone Encounter (Signed)
Transition Care Management Follow-up Telephone Call Date of discharge and from where: 05/24/2022 [facility]  How have you been since you were released from the hospital? Doing ok very tired  Any questions or concerns? No  Items Reviewed: Did the pt receive and understand the discharge instructions provided? Yes  Medications obtained and verified? Yes  Other? No  Any new allergies since your discharge? No  Dietary orders reviewed? No  Do you have support at home? Yes   Home Care and Equipment/Supplies: Were home health services ordered? no If so, what is the name of the agency? N/A  Has the agency set up a time to come to the patient's home? no Were any new equipment or medical supplies ordered?  No What is the name of the medical supply agency? N/A Were you able to get the supplies/equipment? not applicable Do you have any questions related to the use of the equipment or supplies? No  Functional Questionnaire: (I = Independent and D = Dependent) ADLs: I   Bathing/Dressing- I  Meal Prep- I  Eating- I  Maintaining continence- I  Transferring/Ambulation- I  Managing Meds- I  Follow up appointments reviewed:  PCP Hospital f/u appt confirmed? No  was working on [place] up but patient phone lost service. Called x 3 and got [facility] f/u appt confirmed? Yes  Patient states he is scheduled for follow up imaging in a few months but was not sure of date.  Are transportation arrangements needed? No  If their condition worsens, is the pt aware to call PCP or go to the Emergency Dept.? Yes Was the patient provided with contact information for the PCP's office or ED? Yes Was to pt encouraged to call back with questions or concerns? Yes

## 2022-05-26 NOTE — Telephone Encounter (Signed)
Patient call was dropped before we could make hospital follow up please call to schedule with patient.

## 2022-05-29 NOTE — Telephone Encounter (Signed)
Patient called back to office scheduled appointment

## 2022-06-01 ENCOUNTER — Ambulatory Visit (INDEPENDENT_AMBULATORY_CARE_PROVIDER_SITE_OTHER): Payer: 59 | Admitting: Internal Medicine

## 2022-06-01 ENCOUNTER — Encounter: Payer: Self-pay | Admitting: Internal Medicine

## 2022-06-01 ENCOUNTER — Telehealth: Payer: Self-pay

## 2022-06-01 VITALS — BP 110/76 | HR 88 | Temp 98.7°F | Ht 66.0 in | Wt 176.0 lb

## 2022-06-01 DIAGNOSIS — I10 Essential (primary) hypertension: Secondary | ICD-10-CM | POA: Diagnosis not present

## 2022-06-01 DIAGNOSIS — I672 Cerebral atherosclerosis: Secondary | ICD-10-CM | POA: Diagnosis not present

## 2022-06-01 DIAGNOSIS — Z23 Encounter for immunization: Secondary | ICD-10-CM | POA: Diagnosis not present

## 2022-06-01 DIAGNOSIS — Z0279 Encounter for issue of other medical certificate: Secondary | ICD-10-CM

## 2022-06-01 DIAGNOSIS — E1159 Type 2 diabetes mellitus with other circulatory complications: Secondary | ICD-10-CM | POA: Diagnosis not present

## 2022-06-01 DIAGNOSIS — Z794 Long term (current) use of insulin: Secondary | ICD-10-CM | POA: Diagnosis not present

## 2022-06-01 NOTE — Telephone Encounter (Signed)
We received an additional form for short term disability.  Form has been placed in your inbox for signature.  Due by 06/06/22.

## 2022-06-01 NOTE — Progress Notes (Signed)
Subjective:    Patient ID: Calvin Smith, male    DOB: [birth year], 61 y.o.   MRN: [id number]  HPI Here for hospital follow up Had both carotid arteries stented seperately  No problems with neurologic changes post procedure Plans to stay off work---till December 13th  Has had slight dizziness---if he moves his head up quickly No chest pain or SOB  On ASA/plavix for now Will go back at 4 months and probably change to monotherapy (will have repeat MRI)  Checks sugars every morning Averages 110 No sig low sugar reactions--and none since all of this has happened Had dialed down the insulin a while back  Current Outpatient Medications on File Prior to Visit  Medication Sig Dispense Refill   aspirin EC 81 MG tablet Take 81 mg by mouth daily.     atorvastatin (LIPITOR) 80 MG tablet Take 1 tablet (80 mg total) by mouth daily. 30 tablet 2   clopidogrel (PLAVIX) 75 MG tablet TAKE 1 TABLET BY MOUTH DAILY 90 tablet 3   empagliflozin (JARDIANCE) 25 MG TABS tablet Take 25 mg by mouth daily.     glipiZIDE (GLUCOTROL XL) 5 MG 24 hr tablet Take 5 mg by mouth in the morning and at bedtime.     ibuprofen (ADVIL) 200 MG tablet Take 400-600 mg by mouth every 8 (eight) hours as needed for headache.     LANTUS SOLOSTAR 100 UNIT/ML Solostar Pen INJECT SUBCUTANEOUSLY 90  UNITS AT BEDTIME (Patient taking differently: Inject 42 Units into the skin 2 (two) times daily.) 90 mL 3   lisinopril (ZESTRIL) 20 MG tablet Take 20 mg by mouth daily.     metFORMIN (GLUCOPHAGE) 1000 MG tablet Take 1,000 mg by mouth 2 (two) times daily with a meal.     pantoprazole (PROTONIX) 40 MG tablet Take 1 tablet (40 mg total) by mouth daily. 90 tablet 3   Semaglutide, 2 MG/DOSE, (OZEMPIC, 2 MG/DOSE,) 8 MG/3ML SOPN Inject 2 mg into the skin every Saturday.     Ubrogepant (UBRELVY) 100 MG TABS Take 100 mg by mouth as needed (Take 1 tablet at onset of headache, may repeat in 2 hours if needed, max is 200 mg in 24 hours). 10 tablet  3   No current facility-administered medications on file prior to visit.    Allergies  Allergen Reactions   Bisoprolol-Hydrochlorothiazide Other (See Comments)     E.D. Unknown per Pt   Codeine Itching    Past Medical History:  Diagnosis Date   Allergic rhinitis    Arm fracture, left as a child   DeQuervain's disease (tenosynovitis)    right wrist   Diabetes mellitus    type 2   Erectile dysfunction    GERD (gastroesophageal reflux disease)    Hyperlipidemia    Hypertension    Migraines    Peripheral vascular disease ([place])    Sleep apnea    uses nightly   Stroke ([place]) 03/21/2022    Past Surgical History:  Procedure Laterality Date   Cardiolite  5/06   Negative EF 66%   CARPAL TUNNEL RELEASE  9/03   left    IR ANGIO INTRA EXTRACRAN SEL COM CAROTID INNOMINATE BILAT MOD SED  03/23/2022   IR ANGIO INTRA EXTRACRAN SEL COM CAROTID INNOMINATE BILAT MOD SED  05/24/2022   IR ANGIO INTRA EXTRACRAN SEL COM CAROTID INNOMINATE UNI L MOD SED  04/17/2022   IR ANGIO VERTEBRAL SEL SUBCLAVIAN INNOMINATE BILAT MOD SED  03/23/2022   IR ANGIO  VERTEBRAL SEL SUBCLAVIAN INNOMINATE UNI L MOD SED  04/17/2022   IR ANGIO VERTEBRAL SEL SUBCLAVIAN INNOMINATE UNI L MOD SED  05/24/2022   IR ANGIO VERTEBRAL SEL VERTEBRAL UNI L MOD SED  03/29/2022   IR ANGIOGRAM EXTREMITY LEFT  03/29/2022   IR ANGIOGRAM FOLLOW UP STUDY  05/24/2022   IR INTRAVSC STENT CERV CAROTID W/EMB-PROT MOD SED INCL ANGIO  04/17/2022   IR INTRAVSC STENT CERV CAROTID W/EMB-PROT MOD SED INCL ANGIO  05/24/2022   IR TRANSCATH EXCRAN VERT OR CAR A STENT  03/24/2022   IR US GUIDE VASC ACCESS RIGHT  03/23/2022   IR US GUIDE VASC ACCESS RIGHT  03/24/2022   IR US GUIDE VASC ACCESS RIGHT  04/17/2022   IR US GUIDE VASC ACCESS RIGHT  05/24/2022   RADIOLOGY WITH ANESTHESIA Left 03/24/2022   Procedure: IR WITH ANESTHESIA;  Surgeon: Pedro Earls, MD;  Location: [facility];  Service: Radiology;  Laterality: Left;   RADIOLOGY WITH  ANESTHESIA N/A 04/17/2022   Procedure: LEFT CAROTID STENT;  Surgeon: Pedro Earls, MD;  Location: [facility];  Service: Radiology;  Laterality: N/A;   RADIOLOGY WITH ANESTHESIA N/A 05/24/2022   Procedure: Right carotid angioplasty with possible stenting;  Surgeon: de Rosario Jacks, MD;  Location: [facility];  Service: Radiology;  Laterality: N/A;   WRIST SURGERY  1/04   DeQuervain release   WRIST SURGERY  1/08   ORIF l wrist DVR plate screwhead autologous    Family History  Problem Relation Age of Onset   Hypertension Father    Lymphoma Father        non-hodgkins   Uterine cancer Maternal Grandmother    Hypertension Paternal Grandfather    Lung cancer Paternal Aunt    Coronary artery disease Paternal Uncle    Colon cancer Unknown        in 1 relative (?aunt)    Social History   Socioeconomic History   Marital status: Married    Spouse name: Not on file   Number of children: Not on file   Years of education: Not on file   Highest education level: Not on file  Occupational History   Occupation: Buyer, 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  Tobacco Use   Smoking status: Never    Passive exposure: Past   Smokeless tobacco: Never  Vaping Use   Vaping Use: Never used  Substance and Sexual Activity   Alcohol use: Not Currently    Comment: rare   Drug use: No   Sexual activity: Yes    Partners: Female  Other Topics Concern   Not on file  Social History Narrative   Regular exercise: light to moderate   Caffeine use: coffee daily   Social Determinants of Radio broadcast assistant Strain: Not on file  Food Insecurity: Not on file  Transportation Needs: Not on file  Physical Activity: Not on file  Stress: Not on file  Social Connections: Not on file  Intimate Partner Violence: Not on file   Review of Systems Slight shakiness in right hand---no worse with use Sleeps okay---uses CPAP all the time. Wakes up refreshed Appetite is  normal No depression or mood issues    Objective:   Physical Exam Constitutional:      Appearance: Normal appearance.  Cardiovascular:     Rate and Rhythm: Normal rate and regular rhythm.     Heart sounds: No murmur heard.    No  gallop.  Pulmonary:     Effort: Pulmonary effort is normal.     Breath sounds: Normal breath sounds. No wheezing or rales.  Musculoskeletal:     Cervical back: Neck supple.     Right lower leg: No edema.     Left lower leg: No edema.  Lymphadenopathy:     Cervical: No cervical adenopathy.  Neurological:     Mental Status: He is alert.            Assessment & Plan:

## 2022-06-01 NOTE — Assessment & Plan Note (Signed)
Has now had stenting of left vertebral and both carotid arteries after TIA Doing well On ASA/plavix for now Atorvastatin 80

## 2022-06-01 NOTE — Assessment & Plan Note (Signed)
BP Readings from Last 3 Encounters:  06/01/22 110/76  05/25/22 118/70  04/25/22 120/80   Controlled with lisinopril 20

## 2022-06-01 NOTE — Assessment & Plan Note (Signed)
Lab Results  Component Value Date   HGBA1C 6.1 (H) 03/21/2022   Excellent control on semaglutide 2 weekly, jardiance 10, insulin 42 bid, metformin 100 bid, glipizide 5

## 2022-06-02 NOTE — Telephone Encounter (Signed)
Completed form has been faxed to fax# [phone].  Received fax confirmation.  Copies have been made for billing, scanning, patient, and myself.  Patient's copy has been placed in outgoing mail.  Notified patient via mychart.  This form is requesting CPT codes for surgeries performed.  I called patient yesterday to have him request these codes from his surgeon's office.  He will request them.  I have already faxed in the form without the CPT codes so that it does not get turned in late, per patient request.

## 2022-06-18 NOTE — Progress Notes (Unsigned)
    Munira Polson T. Trinita Devlin, MD, [place] at [facility] [place], [address]  Phone: [phone]  FAX: [phone]  ALEKAI POCOCK - 61 y.o. male  MRN [id number]  Date of Birth: 02-24-1961  Date: 06/19/2022  PCP: [name], MD  Referral: [name], MD  No chief complaint on file.   He presents with a trigger finger on the second digit on the right hand.  I have seen him before for trigger finger on the third digit of the right hand.  Tendon Sheath Injection Procedure Note JHASE CREPPEL [birth year] Date of procedure: 06/19/2022  Procedure: Tendon Sheath Injection for Trigger Finger, right second Indications: Pain  Procedure Details Verbal consent was obtained. Risks (including potential risk for skin lightening and potential atrophy), benefits and alternatives were discussed. Prepped with Chloraprep and Ethyl Chloride used for anesthesia. Under sterile conditions, patient injected at palmar crease aiming distally with 45 degree angle towards nodule; injected directly into tendon sheath. Medication flowed freely without resistance.  Needle size: 22 gauge 1 1/2 inch Injection: 1/2 cc of Lidocaine 1% and Kenalog 20 mg Medication: 1/2 cc of Kenalog 40 mg (equaling Kenalog 20 mg)

## 2022-06-19 ENCOUNTER — Ambulatory Visit (INDEPENDENT_AMBULATORY_CARE_PROVIDER_SITE_OTHER): Payer: 59 | Admitting: Family Medicine

## 2022-06-19 ENCOUNTER — Encounter: Payer: Self-pay | Admitting: Family Medicine

## 2022-06-19 VITALS — BP 90/60 | HR 95 | Temp 98.5°F | Ht 66.0 in | Wt 178.4 lb

## 2022-06-19 DIAGNOSIS — M65321 Trigger finger, right index finger: Secondary | ICD-10-CM | POA: Diagnosis not present

## 2022-06-19 MED ORDER — TRIAMCINOLONE ACETONIDE 40 MG/ML IJ SUSP
20.0000 mg | Freq: Once | INTRAMUSCULAR | Status: AC
  Administered 2022-06-19: 20 mg via INTRA_ARTICULAR

## 2022-06-19 NOTE — Addendum Note (Signed)
Addended by: Carter Kitten on: 06/19/2022 10:09 AM   Modules accepted: Orders

## 2022-07-03 ENCOUNTER — Telehealth: Payer: Self-pay

## 2022-07-03 NOTE — Telephone Encounter (Signed)
[facility] - PA Case ID: [id number] Status Sent to Plan today Drug Ubrelvy '100MG'$  tablets Form [employer] Electronic Prior Authorization Form (2017 [place]CP[place])

## 2022-07-06 ENCOUNTER — Telehealth (HOSPITAL_COMMUNITY): Payer: Self-pay

## 2022-07-06 NOTE — Telephone Encounter (Signed)
Called to let pt know he isn't due for f/u until January. I will call him back once auth is received to schedule. AW

## 2022-07-10 ENCOUNTER — Other Ambulatory Visit: Payer: Self-pay | Admitting: Internal Medicine

## 2022-08-14 ENCOUNTER — Other Ambulatory Visit: Payer: Self-pay | Admitting: Internal Medicine

## 2022-08-16 ENCOUNTER — Encounter: Payer: Self-pay | Admitting: Internal Medicine

## 2022-08-16 ENCOUNTER — Ambulatory Visit: Payer: 59 | Admitting: Internal Medicine

## 2022-08-16 VITALS — BP 110/60 | HR 94 | Temp 97.7°F | Ht 66.0 in | Wt 177.0 lb

## 2022-08-16 DIAGNOSIS — K219 Gastro-esophageal reflux disease without esophagitis: Secondary | ICD-10-CM

## 2022-08-16 MED ORDER — PROMETHAZINE-DM 6.25-15 MG/5ML PO SYRP: 5.0000 mL | ORAL_SOLUTION | Freq: Three times a day (TID) | ORAL | 0 refills | Status: DC | PRN

## 2022-08-16 MED ORDER — PANTOPRAZOLE SODIUM 40 MG PO TBEC: 40.0000 mg | DELAYED_RELEASE_TABLET | Freq: Two times a day (BID) | ORAL | 3 refills | Status: DC

## 2022-08-16 NOTE — Patient Instructions (Signed)
Please increase the pantoprazole to twice a day---add a dose at bedtime. Try cutting down more on coffee and raise your bed up a little bit (like with a brick under the headboards). If the symptoms continue, I think we may need to consider reducing the semaglutide (ozempic) dose

## 2022-08-16 NOTE — Progress Notes (Signed)
Subjective:    Patient ID: Calvin Smith, male    DOB: 1961/03/27, 62 y.o.   MRN: [id number]  HPI Here with wife due to increasing reflux symptoms  Reflux is ongoing and worse Will "literally gag" per wife Worst in AM --more so that at night Belching is constant and bad No burning No voice changes or laryngitis No trouble swallowing This goes back for years--but worsened. Burning improved on the pantoprazole Some cough---mostly residual from recent respiratory infection  Sugars okay Estimate of A1c 7% Fasting sugars in 120's  Takes the pantoprazole on empty stomach Doesn't eat for an hour usually Big coffee drinker---mostly in AM (and has cut back)  Ozempic dose increased about 6 months ago  Current Outpatient Medications on File Prior to Visit  Medication Sig Dispense Refill   aspirin EC 81 MG tablet Take 81 mg by mouth daily.     atorvastatin (LIPITOR) 80 MG tablet TAKE 1 TABLET BY MOUTH DAILY 90 tablet 3   clopidogrel (PLAVIX) 75 MG tablet TAKE 1 TABLET BY MOUTH DAILY 90 tablet 3   glipiZIDE (GLUCOTROL XL) 5 MG 24 hr tablet Take 5 mg by mouth in the morning and at bedtime.     ibuprofen (ADVIL) 200 MG tablet Take 400-600 mg by mouth every 8 (eight) hours as needed for headache.     insulin glargine (LANTUS SOLOSTAR) 100 UNIT/ML Solostar Pen Inject 42 Units into the skin 2 (two) times daily.     JARDIANCE 25 MG TABS tablet TAKE 1 TABLET BY MOUTH DAILY 90 tablet 3   lisinopril (ZESTRIL) 20 MG tablet TAKE 1 TABLET BY MOUTH DAILY 90 tablet 3   metFORMIN (GLUCOPHAGE) 1000 MG tablet Take 1,000 mg by mouth 2 (two) times daily with a meal.     pantoprazole (PROTONIX) 40 MG tablet Take 1 tablet (40 mg total) by mouth daily. 90 tablet 3   promethazine-dextromethorphan (PROMETHAZINE-DM) 6.25-15 MG/5ML syrup Take 5 mLs by mouth every 8 (eight) hours as needed for cough.     Semaglutide, 2 MG/DOSE, (OZEMPIC, 2 MG/DOSE,) 8 MG/3ML SOPN Inject 2 mg into the skin every Saturday.      Ubrogepant (UBRELVY) 100 MG TABS Take 100 mg by mouth as needed (Take 1 tablet at onset of headache, may repeat in 2 hours if needed, max is 200 mg in 24 hours). 10 tablet 3   No current facility-administered medications on file prior to visit.    Allergies  Allergen Reactions   Bisoprolol-Hydrochlorothiazide Other (See Comments)     E.D. Unknown per Pt   Codeine Itching    Past Medical History:  Diagnosis Date   Allergic rhinitis    Arm fracture, left as a child   DeQuervain's disease (tenosynovitis)    right wrist   Diabetes mellitus    type 2   Erectile dysfunction    GERD (gastroesophageal reflux disease)    Hyperlipidemia    Hypertension    Migraines    Peripheral vascular disease ([place])    Sleep apnea    uses nightly   Stroke ([place]) 03/21/2022    Past Surgical History:  Procedure Laterality Date   Cardiolite  5/06   Negative EF 66%   CARPAL TUNNEL RELEASE  9/03   left    IR ANGIO INTRA EXTRACRAN SEL COM CAROTID INNOMINATE BILAT MOD SED  03/23/2022   IR ANGIO INTRA EXTRACRAN SEL COM CAROTID INNOMINATE BILAT MOD SED  05/24/2022   IR ANGIO INTRA EXTRACRAN SEL COM CAROTID INNOMINATE  UNI L MOD SED  04/17/2022   IR ANGIO VERTEBRAL SEL SUBCLAVIAN INNOMINATE BILAT MOD SED  03/23/2022   IR ANGIO VERTEBRAL SEL SUBCLAVIAN INNOMINATE UNI L MOD SED  04/17/2022   IR ANGIO VERTEBRAL SEL SUBCLAVIAN INNOMINATE UNI L MOD SED  05/24/2022   IR ANGIO VERTEBRAL SEL VERTEBRAL UNI L MOD SED  03/29/2022   IR ANGIOGRAM EXTREMITY LEFT  03/29/2022   IR ANGIOGRAM FOLLOW UP STUDY  05/24/2022   IR INTRAVSC STENT CERV CAROTID W/EMB-PROT MOD SED INCL ANGIO  04/17/2022   IR INTRAVSC STENT CERV CAROTID W/EMB-PROT MOD SED INCL ANGIO  05/24/2022   IR TRANSCATH EXCRAN VERT OR CAR A STENT  03/24/2022   IR US GUIDE VASC ACCESS RIGHT  03/23/2022   IR US GUIDE VASC ACCESS RIGHT  03/24/2022   IR US GUIDE VASC ACCESS RIGHT  04/17/2022   IR US GUIDE VASC ACCESS RIGHT  05/24/2022   RADIOLOGY WITH ANESTHESIA Left  03/24/2022   Procedure: IR WITH ANESTHESIA;  Surgeon: Pedro Earls, MD;  Location: [facility];  Service: Radiology;  Laterality: Left;   RADIOLOGY WITH ANESTHESIA N/A 04/17/2022   Procedure: LEFT CAROTID STENT;  Surgeon: Pedro Earls, MD;  Location: [facility];  Service: Radiology;  Laterality: N/A;   RADIOLOGY WITH ANESTHESIA N/A 05/24/2022   Procedure: Right carotid angioplasty with possible stenting;  Surgeon: de Rosario Jacks, MD;  Location: [facility];  Service: Radiology;  Laterality: N/A;   WRIST SURGERY  1/04   DeQuervain release   WRIST SURGERY  1/08   ORIF l wrist DVR plate screwhead autologous    Family History  Problem Relation Age of Onset   Hypertension Father    Lymphoma Father        non-hodgkins   Uterine cancer Maternal Grandmother    Hypertension Paternal Grandfather    Lung cancer Paternal Aunt    Coronary artery disease Paternal Uncle    Colon cancer Unknown        in 1 relative (?aunt)    Social History   Socioeconomic History   Marital status: Married    Spouse name: Not on file   Number of children: Not on file   Years of education: Not on file   Highest education level: Not on file  Occupational History   Occupation: Buyer, 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  Tobacco Use   Smoking status: Never    Passive exposure: Past   Smokeless tobacco: Never  Vaping Use   Vaping Use: Never used  Substance and Sexual Activity   Alcohol use: Not Currently    Comment: rare   Drug use: No   Sexual activity: Yes    Partners: Female  Other Topics Concern   Not on file  Social History Narrative   Regular exercise: light to moderate   Caffeine use: coffee daily   Social Determinants of Radio broadcast assistant Strain: Not on file  Food Insecurity: Not on file  Transportation Needs: Not on file  Physical Activity: Not on file  Stress: Not on file  Social Connections: Not on file  Intimate Partner  Violence: Not on file   Review of Systems No N/V Bowels are okay     Objective:   Physical Exam Constitutional:      Appearance: Normal appearance.  Abdominal:     Palpations: Abdomen is soft.     Tenderness: There is no abdominal tenderness.  Neurological:  Mental Status: He is alert.            Assessment & Plan:

## 2022-08-16 NOTE — Assessment & Plan Note (Addendum)
More belching and occasionally gagging --but mostly in AM and not after eating I am concerned about it being from the semaglutide Should try cutting back on caffeine/coffee more Will increase pantoprazole to 40 bid No indication for EGD at this point---no dysphagia Raise head of bed Might need to consider reducing the semaglutide

## 2022-08-31 ENCOUNTER — Telehealth (HOSPITAL_COMMUNITY): Payer: Self-pay

## 2022-08-31 ENCOUNTER — Other Ambulatory Visit: Payer: Self-pay | Admitting: Internal Medicine

## 2022-08-31 NOTE — Telephone Encounter (Signed)
Called to schedule f/u ultrasound, no answer, left vm. AB

## 2022-09-04 ENCOUNTER — Other Ambulatory Visit (HOSPITAL_COMMUNITY): Payer: Self-pay | Admitting: Neuroradiology

## 2022-09-04 DIAGNOSIS — I6529 Occlusion and stenosis of unspecified carotid artery: Secondary | ICD-10-CM

## 2022-09-04 DIAGNOSIS — G450 Vertebro-basilar artery syndrome: Secondary | ICD-10-CM

## 2022-09-05 ENCOUNTER — Other Ambulatory Visit: Payer: Self-pay | Admitting: Internal Medicine

## 2022-09-14 ENCOUNTER — Telehealth (HOSPITAL_COMMUNITY): Payer: Self-pay

## 2022-09-14 ENCOUNTER — Ambulatory Visit (HOSPITAL_COMMUNITY)
Admission: RE | Admit: 2022-09-14 | Discharge: 2022-09-14 | Disposition: A | Discharge status: Home / Self Care | Payer: 59 | Source: Ambulatory Visit | Attending: Neuroradiology | Admitting: Neuroradiology

## 2022-09-14 DIAGNOSIS — I6529 Occlusion and stenosis of unspecified carotid artery: Secondary | ICD-10-CM | POA: Diagnosis present

## 2022-09-14 NOTE — Telephone Encounter (Signed)
Pt agreed with below plan. AB

## 2022-09-14 NOTE — Progress Notes (Signed)
VASCULAR LAB    Carotid duplex has been performed.  See CV proc for preliminary results.   Riggs Dineen, RVT 09/14/2022, 9:47 AM

## 2022-09-14 NOTE — Telephone Encounter (Signed)
-----   Message from Pedro Earls, MD sent at 09/14/2022 12:08 PM EST ----- Hello girls,   According to recent ultrasoung, Mr. Habermann stents are all patent with no significant stenosis. Could you please communicate this to him and let him know that he is good to stop the plavix. Continue on aspirin and get another ultrasound in 1 year.  Thanks, [employer]

## 2022-10-17 ENCOUNTER — Other Ambulatory Visit: Payer: Self-pay | Admitting: Internal Medicine

## 2022-10-17 NOTE — Telephone Encounter (Signed)
Pt is requesting the refill. Not on his med list.

## 2022-10-18 MED ORDER — SILDENAFIL CITRATE 100 MG PO TABS: ORAL_TABLET | ORAL | 11 refills | Status: DC

## 2022-10-18 NOTE — Telephone Encounter (Signed)
Spoke to pt. Looked at [facility] was reasonably priced with a GoodRx card. Sent rx to [facility].

## 2022-10-31 ENCOUNTER — Encounter: Payer: Self-pay | Admitting: Neurology

## 2022-10-31 ENCOUNTER — Ambulatory Visit: Payer: 59 | Admitting: Neurology

## 2022-10-31 VITALS — BP 127/81 | HR 89 | Ht 66.0 in | Wt 177.0 lb

## 2022-10-31 DIAGNOSIS — H814 Vertigo of central origin: Secondary | ICD-10-CM

## 2022-10-31 DIAGNOSIS — G459 Transient cerebral ischemic attack, unspecified: Secondary | ICD-10-CM | POA: Diagnosis not present

## 2022-10-31 DIAGNOSIS — I63211 Cerebral infarction due to unspecified occlusion or stenosis of right vertebral arteries: Secondary | ICD-10-CM | POA: Diagnosis not present

## 2022-10-31 NOTE — Progress Notes (Unsigned)
Patient: Calvin Smith Date of Birth: 1961/04/22  Reason for Visit: Follow up History from: Patient, wife Primary Neurologist: Saw Dr. Leonie Smith   ASSESSMENT AND PLAN 62 y.o. year old male   In August 2023 suffered likely posterior TIA with vertebrobasilar insufficiency likely due to large vessel disease. 03/24/22 underwent vertebral artery stenting. 04/17/22 underwent left carotid artery stenting, 05/17/22 right carotid artery stenting.  Remains on aspirin 81 mg daily, stopped Plavix in January 2024 after visit with Dr. Norma Smith.  Today, concern for lightheadedness sensation when looking up or down.  Also new type of headache since December 2023, with visual distortion, without any telltale head pain.  Also word finding trouble, fatigue.  He stopped his CPAP in January.  Will check MRI of the brain with and without contrast given new type of headache.  1.  TIA, posterior -With vertebrobasilar insufficiency likely large vessel disease -DAPT aspirin 81 mg daily, Plavix 75 mg daily after intracranial stenting -He has done well, no lingering issues  2.  Carotid stenosis -Bilateral carotid high-grade stenosis on CTA, bilateral ICA 80% stenosis on cerebral angiogram -04/17/22 underwent left carotid stenting, planning for right in about 6 weeks -Keep close follow-up with Dr. Debbrah Smith  3.  Hypertension -BP goal less than 130/90 -At goal, on lisinopril  4.  Hyperlipidemia -LDL 62, goal less than 70 -On Lipitor 80  5.  Type 2 diabetes, controlled -A1c 6.1, goal less than 7.0 -On Lantus, Jardiance, Ozempic, metformin, glipizide  6.  History of migraine headaches -Try Ubrelvy 100 mg as needed for acute headache -Discussed avoid triptan given vascular risk factors  7.  OSA on CPAP -Needs to follow-up with pulmonary, contact info given  For any acute stroke symptoms he should go to the ER.  I will see him back in 6 months or sooner if needed.   HISTORY OF PRESENT ILLNESS: Today  10/31/22 Underwent right carotid angioplasty with stenting 05/24/2022.  He did well.  Remains on aspirin 81 mg daily.  Carotid duplex February 2024 showed patent right and left ICA stent and vertebral artery stent. Mentions feeling lightheaded, with looking up and down. There is no dizziness. It passes on it's own. Has been present since carotid stents/TIA. Occasionally trouble with word finding, word comes to him eventually. Has always had headaches, but since December, starts with flashing squiggly lines, no headaches, lasts few minutes. Happens few times a week. 1 migraine since last seen, had been getting better with age. Is back to work full time at [employer]. Has a lot of fatigue. Intermittent tremor to hands at baseline, but getting worse. Trouble with fine motor skills. Dr. Debbrah Smith stopped Plavix, just on aspirin 81 mg. He stopped his CPAP in January giving him headaches. Claims had eye exam 1/31 told more diabetic eye changes, A1C 7.5.   04/25/22 SS: Calvin Smith is here today for stroke clinic follow-up. 03/21/22 had sudden onset of ataxic gait while cleaning the pool, with vertigo and nausea.  EMS came, his wife brought him.  He was back to baseline.  CTA head and neck showed multifocal severe stenosis in bilateral carotids and left vertebral.  Had vertebral artery stenting with Dr. Norma Smith 03/24/22.  He returned back to see Dr. Debbrah Smith 04/17/22 for left carotid stenting, no issues occurred. He will return back in another 6 weeks for right carotid stenting.  Is a Librarian, academic for [employer].  He is out on FMLA.  Has been doing well other than some fatigue.  He  has a long history of migraine headache.  Takes Maxalt, takes them rarely.  He has frequent headache at baseline.  Recently he went 3 days with no headache.  During hospitalization his Lipitor was increased from 20-40.   -CT head no acute abnormality -CTA head and neck bilateral ICA bulb high-grade stenosis, left V1 high-grade  stenosis -Cerebral angio left V1 85% stenosis, right V1 70% stenosis, bilateral ICA 80% stenosis -MRI showed no acute infarct -Post left vertebral artery stenting 03/24/22 -LDL 62 -A1c 6.1 -Aspirin 81 mg daily prior to admission, DAPT aspirin 81 mg daily and Plavix 75 mg daily after intracranial stenting  HISTORY  Copied 03/22/22 Dr. Sharyn Smith & P: This is a 62 year old gentleman with past medical history significant for diabetes, hypertension, hyperlipidemia, migraines, sleep apnea who presented to the ED for evaluation after an episode of ataxic gait which began at 5:30 PM yesterday when he was cleaning his pool.  He suddenly felt very unsteady on his feet and had gait instability.  He was able to get into the house without falling and then laid down on the couch.  He had vertigo and nausea at this time as well.  Symptoms resolved after 20 minutes.  Patient has not had any similar episodes in the past.  Patient was brought in by EMS for further evaluation.  At that time he was completely asymptomatic with a stroke scale of 0.  CT of the head without contrast showed no acute intracranial abnormality. CTA H&N showed multifocal severe stenosis in bilat carotids and L vert:   CTA H&N 1. Negative for large vessel occlusion, but positive for bulky calcified plaque at both carotid bifurcations and the proximal left vertebral artery: - Right ICA origin and bulb RADIOGRAPHIC STRING SIGN. - proximal Left Vertebral Artery RADIOGRAPHIC STRING SIGN. - Left Carotid Bifurcation High-grade Stenosis, approaching a radiographic string sign. 2. Also moderate Right Vertebral Artery V1 and V4 segment stenoses due to plaque. 3. Aortic Atherosclerosis (ICD10-I70.0).   MRI brain wo contrast showed no e/o acute infarct, moderate CSVID  REVIEW OF SYSTEMS: Out of a complete 14 system review of symptoms, the patient complains only of the following symptoms, and all other reviewed systems are negative.  See  HPI  ALLERGIES: Allergies  Allergen Reactions   Bisoprolol-Hydrochlorothiazide Other (See Comments)     E.D. Unknown per Pt   Codeine Itching    HOME MEDICATIONS: Outpatient Medications Prior to Visit  Medication Sig Dispense Refill   aspirin EC 81 MG tablet Take 81 mg by mouth daily.     atorvastatin (LIPITOR) 80 MG tablet TAKE 1 TABLET BY MOUTH DAILY 90 tablet 3   glipiZIDE (GLUCOTROL XL) 5 MG 24 hr tablet Take 5 mg by mouth in the morning and at bedtime.     ibuprofen (ADVIL) 200 MG tablet Take 400-600 mg by mouth every 8 (eight) hours as needed for headache.     insulin glargine (LANTUS SOLOSTAR) 100 UNIT/ML Solostar Pen Inject 42 Units into the skin 2 (two) times daily.     Insulin Pen Needle (BD PEN NEEDLE NANO U/F) 32G X 4 MM MISC USE ONCE DAILY 90 each 3   JARDIANCE 25 MG TABS tablet TAKE 1 TABLET BY MOUTH DAILY 90 tablet 3   lisinopril (ZESTRIL) 20 MG tablet TAKE 1 TABLET BY MOUTH DAILY 90 tablet 3   metFORMIN (GLUCOPHAGE) 1000 MG tablet TAKE 1 TABLET BY MOUTH TWICE  DAILY WITH MEALS 180 tablet 3   pantoprazole (PROTONIX) 40 MG  tablet Take 1 tablet (40 mg total) by mouth 2 (two) times daily. 180 tablet 3   promethazine-dextromethorphan (PROMETHAZINE-DM) 6.25-15 MG/5ML syrup Take 5 mLs by mouth every 8 (eight) hours as needed for cough. 118 mL 0   Semaglutide, 2 MG/DOSE, (OZEMPIC, 2 MG/DOSE,) 8 MG/3ML SOPN Inject 2 mg into the skin every Saturday.     sildenafil (VIAGRA) 100 MG tablet 1/2 to 1 tab by mouth daily as needed. 10 tablet 11   Ubrogepant (UBRELVY) 100 MG TABS Take 100 mg by mouth as needed (Take 1 tablet at onset of headache, may repeat in 2 hours if needed, max is 200 mg in 24 hours). 10 tablet 3   clopidogrel (PLAVIX) 75 MG tablet TAKE 1 TABLET BY MOUTH DAILY 90 tablet 3   No facility-administered medications prior to visit.    PAST MEDICAL HISTORY: Past Medical History:  Diagnosis Date   Allergic rhinitis    Arm fracture, left as a child   DeQuervain's  disease (tenosynovitis)    right wrist   Diabetes mellitus    type 2   Erectile dysfunction    GERD (gastroesophageal reflux disease)    Hyperlipidemia    Hypertension    Migraines    Peripheral vascular disease (HCC)    Sleep apnea    uses nightly   Stroke ([place]) 03/21/2022    PAST SURGICAL HISTORY: Past Surgical History:  Procedure Laterality Date   Cardiolite  5/06   Negative EF 66%   CARPAL TUNNEL RELEASE  9/03   left    IR ANGIO INTRA EXTRACRAN SEL COM CAROTID INNOMINATE BILAT MOD SED  03/23/2022   IR ANGIO INTRA EXTRACRAN SEL COM CAROTID INNOMINATE BILAT MOD SED  05/24/2022   IR ANGIO INTRA EXTRACRAN SEL COM CAROTID INNOMINATE UNI L MOD SED  04/17/2022   IR ANGIO VERTEBRAL SEL SUBCLAVIAN INNOMINATE BILAT MOD SED  03/23/2022   IR ANGIO VERTEBRAL SEL SUBCLAVIAN INNOMINATE UNI L MOD SED  04/17/2022   IR ANGIO VERTEBRAL SEL SUBCLAVIAN INNOMINATE UNI L MOD SED  05/24/2022   IR ANGIO VERTEBRAL SEL VERTEBRAL UNI L MOD SED  03/29/2022   IR ANGIOGRAM EXTREMITY LEFT  03/29/2022   IR ANGIOGRAM FOLLOW UP STUDY  05/24/2022   IR INTRAVSC STENT CERV CAROTID W/EMB-PROT MOD SED INCL ANGIO  04/17/2022   IR INTRAVSC STENT CERV CAROTID W/EMB-PROT MOD SED INCL ANGIO  05/24/2022   IR TRANSCATH EXCRAN VERT OR CAR A STENT  03/24/2022   IR US GUIDE VASC ACCESS RIGHT  03/23/2022   IR US GUIDE VASC ACCESS RIGHT  03/24/2022   IR US GUIDE VASC ACCESS RIGHT  04/17/2022   IR US GUIDE VASC ACCESS RIGHT  05/24/2022   RADIOLOGY WITH ANESTHESIA Left 03/24/2022   Procedure: IR WITH ANESTHESIA;  Surgeon: Pedro Earls, MD;  Location: [facility];  Service: Radiology;  Laterality: Left;   RADIOLOGY WITH ANESTHESIA N/A 04/17/2022   Procedure: LEFT CAROTID STENT;  Surgeon: Pedro Earls, MD;  Location: [facility];  Service: Radiology;  Laterality: N/A;   RADIOLOGY WITH ANESTHESIA N/A 05/24/2022   Procedure: Right carotid angioplasty with possible stenting;  Surgeon: de Rosario Jacks, MD;   Location: [facility];  Service: Radiology;  Laterality: N/A;   WRIST SURGERY  1/04   DeQuervain release   WRIST SURGERY  1/08   ORIF l wrist DVR plate screwhead autologous    FAMILY HISTORY: Family History  Problem Relation Age of Onset   Hypertension Father  Lymphoma Father        non-hodgkins   Uterine cancer Maternal Grandmother    Hypertension Paternal Grandfather    Lung cancer Paternal Aunt    Coronary artery disease Paternal Uncle    Colon cancer Unknown        in 1 relative (?aunt)    SOCIAL HISTORY: Social History   Socioeconomic History   Marital status: Married    Spouse name: Not on file   Number of children: Not on file   Years of education: Not on file   Highest education level: Not on file  Occupational History   Occupation: Buyer, 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  Tobacco Use   Smoking status: Never    Passive exposure: Past   Smokeless tobacco: Never  Vaping Use   Vaping Use: Never used  Substance and Sexual Activity   Alcohol use: Not Currently    Comment: rare   Drug use: No   Sexual activity: Yes    Partners: Female  Other Topics Concern   Not on file  Social History Narrative   Regular exercise: light to moderate   Caffeine use: coffee daily   Social Determinants of Radio broadcast assistant Strain: Not on file  Food Insecurity: Not on file  Transportation Needs: Not on file  Physical Activity: Not on file  Stress: Not on file  Social Connections: Not on file  Intimate Partner Violence: Not on file    PHYSICAL EXAM  Vitals:   10/31/22 1032  BP: 136/75  Pulse: 89  Weight: 177 lb (80.3 kg)  Height: 5\' 6"  (1.676 m)    Body mass index is 28.57 kg/m.  Generalized: Well developed, in no acute distress  Neurological examination  Mentation: Alert oriented to time, place, history taking. Follows all commands speech and language fluent Cranial nerve II-XII: Pupils were equal round reactive to light.  Extraocular movements were full, visual field were full on confrontational test. Facial sensation and strength were normal.  Head turning and shoulder shrug  were normal and symmetric. Motor: The motor testing reveals 5 over 5 strength of all 4 extremities. Good symmetric motor tone is noted throughout.  Sensory: Sensory testing is intact to soft touch on all 4 extremities. No evidence of extinction is noted.  Coordination: Cerebellar testing reveals good finger-nose-finger and heel-to-shin bilaterally.  Mild tremor with finger-nose-finger. Gait and station: Gait is normal. Tandem gait is normal.  Romberg is negative. Reflexes: Deep tendon reflexes are symmetric and normal bilaterally.   DIAGNOSTIC DATA (LABS, IMAGING, TESTING) - I reviewed patient records, labs, notes, testing and imaging myself where available.  Lab Results  Component Value Date   WBC 6.6 05/24/2022   HGB 14.7 05/24/2022   HCT 42.1 05/24/2022   MCV 93.3 05/24/2022   PLT 227 05/24/2022      Component Value Date/Time   NA 138 05/24/2022 0703   NA 138 11/01/2021 1143   K 3.9 05/24/2022 0703   CL 105 05/24/2022 0703   CO2 23 05/24/2022 0703   GLUCOSE 133 (H) 05/24/2022 0703   BUN 15 05/24/2022 0703   BUN 13 11/01/2021 1143   CREATININE 1.16 05/24/2022 0703   CALCIUM 9.4 05/24/2022 0703   PROT 6.3 (L) 03/23/2022 0240   PROT 6.7 11/01/2021 1143   ALBUMIN 3.4 (L) 03/23/2022 0240   ALBUMIN 4.6 11/01/2021 1143   AST 30 03/23/2022 0240   ALT 40 03/23/2022 0240   ALKPHOS 57  03/23/2022 0240   BILITOT 1.5 (H) 03/23/2022 0240   BILITOT 0.7 11/01/2021 1143   GFRNONAA >60 05/24/2022 0703   GFRAA 74 02/17/2020 0855   Lab Results  Component Value Date   CHOL 113 03/22/2022   HDL 32 (L) 03/22/2022   LDLCALC 62 03/22/2022   TRIG 95 03/22/2022   CHOLHDL 3.5 03/22/2022   Lab Results  Component Value Date   HGBA1C 6.1 (H) 03/21/2022   Lab Results  Component Value Date   [id number] 07/12/2016   Lab Results   Component Value Date   TSH 2.541 03/22/2022    Butler Denmark, AGNP-C, DNP 10/31/2022, 11:09 AM [facility] [address] [place], [place] [address] [phone]

## 2022-11-01 ENCOUNTER — Telehealth: Payer: Self-pay | Admitting: Neurology

## 2022-11-01 ENCOUNTER — Telehealth: Payer: Self-pay

## 2022-11-01 LAB — BASIC METABOLIC PANEL
BUN/Creatinine Ratio: 10 (ref 10–24)
BUN: 13 mg/dL (ref 8–27)
CO2: 21 mmol/L (ref 20–29)
Calcium: 9.8 mg/dL (ref 8.6–10.2)
Chloride: 98 mmol/L (ref 96–106)
Creatinine, Ser: 1.25 mg/dL (ref 0.76–1.27)
Glucose: 72 mg/dL (ref 70–99)
Potassium: 4.6 mmol/L (ref 3.5–5.2)
Sodium: 137 mmol/L (ref 134–144)
eGFR: 66 mL/min/{1.73_m2} (ref >59)

## 2022-11-01 NOTE — Telephone Encounter (Signed)
Left pt a message to call back regarding result: Result Care Coordination   Patient Communication   Edit Comments   Edit Notifications  Back to Top   Mr. Cham, blood work is normal.  I added on CT angiogram head and neck to check the stents and vessels in your head and neck.  We will still get MRI of the brain.  I will update you once results return.  Please let me know if you have any questions.  Thanks, Judson Roch

## 2022-11-01 NOTE — Telephone Encounter (Signed)
[facility] [place] Case Number: [id number] Sent to [place]

## 2022-11-02 NOTE — Progress Notes (Signed)
I agree with the above plan 

## 2022-11-03 ENCOUNTER — Encounter: Payer: 59 | Admitting: Internal Medicine

## 2022-11-13 ENCOUNTER — Ambulatory Visit (INDEPENDENT_AMBULATORY_CARE_PROVIDER_SITE_OTHER): Payer: 59 | Admitting: Internal Medicine

## 2022-11-13 ENCOUNTER — Encounter: Payer: Self-pay | Admitting: Internal Medicine

## 2022-11-13 ENCOUNTER — Ambulatory Visit (INDEPENDENT_AMBULATORY_CARE_PROVIDER_SITE_OTHER)
Admission: RE | Admit: 2022-11-13 | Discharge: 2022-11-13 | Disposition: A | Discharge status: Home / Self Care | Payer: 59 | Source: Ambulatory Visit | Attending: Internal Medicine | Admitting: Internal Medicine

## 2022-11-13 VITALS — BP 104/60 | HR 92 | Temp 97.2°F | Ht 64.25 in | Wt 175.0 lb

## 2022-11-13 DIAGNOSIS — Z0000 Encounter for general adult medical examination without abnormal findings: Secondary | ICD-10-CM | POA: Diagnosis not present

## 2022-11-13 DIAGNOSIS — N401 Enlarged prostate with lower urinary tract symptoms: Secondary | ICD-10-CM

## 2022-11-13 DIAGNOSIS — M25552 Pain in left hip: Secondary | ICD-10-CM

## 2022-11-13 DIAGNOSIS — Z125 Encounter for screening for malignant neoplasm of prostate: Secondary | ICD-10-CM

## 2022-11-13 DIAGNOSIS — G459 Transient cerebral ischemic attack, unspecified: Secondary | ICD-10-CM | POA: Diagnosis not present

## 2022-11-13 DIAGNOSIS — Z1211 Encounter for screening for malignant neoplasm of colon: Secondary | ICD-10-CM

## 2022-11-13 DIAGNOSIS — I10 Essential (primary) hypertension: Secondary | ICD-10-CM

## 2022-11-13 DIAGNOSIS — E1159 Type 2 diabetes mellitus with other circulatory complications: Secondary | ICD-10-CM | POA: Diagnosis not present

## 2022-11-13 DIAGNOSIS — Z794 Long term (current) use of insulin: Secondary | ICD-10-CM

## 2022-11-13 LAB — HM DIABETES FOOT EXAM

## 2022-11-13 MED ORDER — TAMSULOSIN HCL 0.4 MG PO CAPS: 0.4000 mg | ORAL_CAPSULE | Freq: Every day | ORAL | 11 refills | Status: DC

## 2022-11-13 NOTE — Assessment & Plan Note (Signed)
Seems like OA Not ready for surgery Will check x-rays Send to Dr Magnus Ivan if worsens

## 2022-11-13 NOTE — Assessment & Plan Note (Signed)
Ready to try medication Tamsulosin--would switch to finasteride if more dizziness

## 2022-11-13 NOTE — Assessment & Plan Note (Signed)
Actually has mild deficits On ASA and atorvastatin Having follow up studies

## 2022-11-13 NOTE — Assessment & Plan Note (Signed)
BP Readings from Last 3 Encounters:  11/13/22 104/60  10/31/22 127/81  08/16/22 110/60   Good control with lisinopril 20

## 2022-11-13 NOTE — Progress Notes (Signed)
Subjective:    Patient ID: Calvin Smith, male    DOB: [birth year], 62 y.o.   MRN: [id number]  HPI Here for physical With wife  Still has some dizzy spells Trouble finding words and some short term memory deficits Getting CT/MRI follow up soon  Having sores on his feet--right more than left  Hit them on rocking chair Dorsum---has tried to wear looser shoes   More trouble with his left hip Did have bone donor taken from there for his arm Uses ibuprofen with relief No orthopedist ---has seen Dr Patsy Lager  Sugars up and down Checks daily---but has had more low sugar reactions of late Wonders about CBG Was 7.5% 3 months ago--but thinks it is better now No foot numbness or burning  Current Outpatient Medications on File Prior to Visit  Medication Sig Dispense Refill   aspirin EC 81 MG tablet Take 81 mg by mouth daily.     atorvastatin (LIPITOR) 80 MG tablet TAKE 1 TABLET BY MOUTH DAILY 90 tablet 3   glipiZIDE (GLUCOTROL XL) 5 MG 24 hr tablet Take 5 mg by mouth in the morning and at bedtime.     ibuprofen (ADVIL) 200 MG tablet Take 400-600 mg by mouth every 8 (eight) hours as needed for headache.     insulin glargine (LANTUS SOLOSTAR) 100 UNIT/ML Solostar Pen Inject 42 Units into the skin 2 (two) times daily.     Insulin Pen Needle (BD PEN NEEDLE NANO U/F) 32G X 4 MM MISC USE ONCE DAILY 90 each 3   JARDIANCE 25 MG TABS tablet TAKE 1 TABLET BY MOUTH DAILY 90 tablet 3   lisinopril (ZESTRIL) 20 MG tablet TAKE 1 TABLET BY MOUTH DAILY 90 tablet 3   metFORMIN (GLUCOPHAGE) 1000 MG tablet TAKE 1 TABLET BY MOUTH TWICE  DAILY WITH MEALS 180 tablet 3   pantoprazole (PROTONIX) 40 MG tablet Take 1 tablet (40 mg total) by mouth 2 (two) times daily. 180 tablet 3   Semaglutide, 2 MG/DOSE, (OZEMPIC, 2 MG/DOSE,) 8 MG/3ML SOPN Inject 2 mg into the skin every Saturday.     sildenafil (VIAGRA) 100 MG tablet 1/2 to 1 tab by mouth daily as needed. 10 tablet 11   Ubrogepant (UBRELVY) 100 MG TABS Take 100  mg by mouth as needed (Take 1 tablet at onset of headache, may repeat in 2 hours if needed, max is 200 mg in 24 hours). 10 tablet 3   No current facility-administered medications on file prior to visit.    Allergies  Allergen Reactions   Bisoprolol-Hydrochlorothiazide Other (See Comments)     E.D. Unknown per Pt   Codeine Itching    Past Medical History:  Diagnosis Date   Allergic rhinitis    Arm fracture, left as a child   DeQuervain's disease (tenosynovitis)    right wrist   Diabetes mellitus    type 2   Erectile dysfunction    GERD (gastroesophageal reflux disease)    Hyperlipidemia    Hypertension    Migraines    Peripheral vascular disease    Sleep apnea    uses nightly   Stroke 03/21/2022    Past Surgical History:  Procedure Laterality Date   Cardiolite  5/06   Negative EF 66%   CARPAL TUNNEL RELEASE  9/03   left    IR ANGIO INTRA EXTRACRAN SEL COM CAROTID INNOMINATE BILAT MOD SED  03/23/2022   IR ANGIO INTRA EXTRACRAN SEL COM CAROTID INNOMINATE BILAT MOD SED  05/24/2022  IR ANGIO INTRA EXTRACRAN SEL COM CAROTID INNOMINATE UNI L MOD SED  04/17/2022   IR ANGIO VERTEBRAL SEL SUBCLAVIAN INNOMINATE BILAT MOD SED  03/23/2022   IR ANGIO VERTEBRAL SEL SUBCLAVIAN INNOMINATE UNI L MOD SED  04/17/2022   IR ANGIO VERTEBRAL SEL SUBCLAVIAN INNOMINATE UNI L MOD SED  05/24/2022   IR ANGIO VERTEBRAL SEL VERTEBRAL UNI L MOD SED  03/29/2022   IR ANGIOGRAM EXTREMITY LEFT  03/29/2022   IR ANGIOGRAM FOLLOW UP STUDY  05/24/2022   IR INTRAVSC STENT CERV CAROTID W/EMB-PROT MOD SED INCL ANGIO  04/17/2022   IR INTRAVSC STENT CERV CAROTID W/EMB-PROT MOD SED INCL ANGIO  05/24/2022   IR TRANSCATH EXCRAN VERT OR CAR A STENT  03/24/2022   IR US GUIDE VASC ACCESS RIGHT  03/23/2022   IR US GUIDE VASC ACCESS RIGHT  03/24/2022   IR US GUIDE VASC ACCESS RIGHT  04/17/2022   IR US GUIDE VASC ACCESS RIGHT  05/24/2022   RADIOLOGY WITH ANESTHESIA Left 03/24/2022   Procedure: IR WITH ANESTHESIA;  Surgeon: Baldemar Lenisde  Macedo Rodrigues, [name], MD;  [facility];  Service: Radiology;  Laterality: Left;   RADIOLOGY WITH ANESTHESIA N/A 04/17/2022   Procedure: LEFT CAROTID STENT;  Surgeon: Baldemar Lenisde Macedo Rodrigues, [name], MD;  [facility];  Service: Radiology;  Laterality: N/A;   RADIOLOGY WITH ANESTHESIA N/A 05/24/2022   Procedure: Right carotid angioplasty with possible stenting;  Surgeon: de Glori LuisMacedo Rodrigues, [name], MD;  [facility];  Service: Radiology;  Laterality: N/A;   WRIST SURGERY  1/04   DeQuervain release   WRIST SURGERY  1/08   ORIF l wrist DVR plate screwhead autologous    Family History  Problem Relation Age of Onset   Hypertension Father    Lymphoma Father        non-hodgkins   Uterine cancer Maternal Grandmother    Hypertension Paternal Grandfather    Lung cancer Paternal Aunt    Coronary artery disease Paternal Uncle    Colon cancer Unknown        in 1 relative (?aunt)    Social History   Socioeconomic History   Marital status: Married    Spouse name: Not on file   Number of children: Not on file   Years of education: Not on file   Highest education level: Not on file  Occupational History   Occupation: Event organiserUPERVISOR    Employer: [employer]    Comment: immunoassay dept  Tobacco Use   Smoking status: Never    Passive exposure: Past   Smokeless tobacco: Never  Vaping Use   Vaping Use: Never used  Substance and Sexual Activity   Alcohol use: Not Currently    Comment: rare   Drug use: No   Sexual activity: Yes    Partners: Female  Other Topics Concern   Not on file  Social History Narrative   Regular exercise: light to moderate   Caffeine use: coffee daily   Social Determinants of Corporate investment bankerHealth   Financial Resource Strain: Not on file  Food Insecurity: Not on file  Transportation Needs: Not on file  Physical Activity: Not on file  Stress: Not on file  Social Connections: Not on file  Intimate Partner Violence: Not on file   Review of Systems   Constitutional:  Negative for fatigue and unexpected weight change.       Wears seat belt  HENT:  Negative for dental problem, hearing loss, tinnitus and trouble swallowing.  Keeps up with dentist  Eyes:  Negative for visual disturbance.       No diplopia or unilateral vision loss Mild change in correction  Respiratory:  Negative for cough, chest tightness and shortness of breath.   Cardiovascular:  Negative for chest pain, palpitations and leg swelling.  Gastrointestinal:  Negative for blood in stool and constipation.       Some heartburn---controlled with pantoprazole Still belches  Endocrine: Negative for polydipsia and polyuria.  Genitourinary:  Positive for frequency.       Slow flow---feels ready to try medication  Musculoskeletal:  Positive for arthralgias. Negative for back pain and joint swelling.  Skin:  Negative for rash.  Allergic/Immunologic: Positive for environmental allergies. Negative for immunocompromised state.       No meds--mild  Neurological:  Positive for dizziness. Negative for syncope.       Only 1 migraine in the past 3 months Occ tension HA  Hematological:  Negative for adenopathy. Bruises/bleeds easily.  Psychiatric/Behavioral:  Negative for dysphoric mood and sleep disturbance. The patient is not nervous/anxious.        Objective:   Physical Exam Constitutional:      Appearance: Normal appearance.  HENT:     Mouth/Throat:     Pharynx: No oropharyngeal exudate or posterior oropharyngeal erythema.  Eyes:     Conjunctiva/sclera: Conjunctivae normal.     Pupils: Pupils are equal, round, and reactive to light.  Cardiovascular:     Rate and Rhythm: Normal rate and regular rhythm.     Pulses: Normal pulses.     Heart sounds:     No gallop.  Pulmonary:     Effort: Pulmonary effort is normal.     Breath sounds: Normal breath sounds. No wheezing or rales.  Abdominal:     Palpations: Abdomen is soft.     Tenderness: There is no abdominal  tenderness.  Musculoskeletal:     Cervical back: Neck supple.     Right lower leg: No edema.     Left lower leg: No edema.     Comments: Very limited internal rotation in left hip  Lymphadenopathy:     Cervical: No cervical adenopathy.  Skin:    Findings: No lesion or rash.     Comments: Granulated spots on on dorsum of feet---no other lesions  Neurological:     General: No focal deficit present.     Mental Status: He is alert and oriented to person, place, and time.     Comments: Normal sensation in feet  Psychiatric:        Mood and Affect: Mood normal.        Behavior: Behavior normal.            Assessment & Plan:

## 2022-11-13 NOTE — Assessment & Plan Note (Signed)
Healthy now Stays active--discussed resistance Ready for colonoscopy Will check PSA Not excited about updated COVID vaccines but will take flu in the fall

## 2022-11-13 NOTE — Assessment & Plan Note (Signed)
Thinks he is better again Would be worth looking into CGM again On lantus 45 bid, jardiance 25, glipizide 5 daily, metformin 1000 bid and ozempic 2mg  weekly

## 2022-11-14 LAB — CBC
Hematocrit: 44 % (ref 37.5–51.0)
Hemoglobin: 15.4 g/dL (ref 13.0–17.7)
MCH: 33 pg (ref 26.6–33.0)
MCHC: 35 g/dL (ref 31.5–35.7)
MCV: 94 fL (ref 79–97)
Platelets: 246 10*3/uL (ref 150–450)
RBC: 4.67 x10E6/uL (ref 4.14–5.80)
RDW: 12.7 % (ref 11.6–15.4)
WBC: 7.6 10*3/uL (ref 3.4–10.8)

## 2022-11-14 LAB — LIPID PANEL
Chol/HDL Ratio: 2.7 ratio (ref 0.0–5.0)
Cholesterol, Total: 115 mg/dL (ref 100–199)
HDL: 42 mg/dL (ref >39)
LDL Chol Calc (NIH): 55 mg/dL (ref 0–99)
Triglycerides: 92 mg/dL (ref 0–149)
VLDL Cholesterol Cal: 18 mg/dL (ref 5–40)

## 2022-11-14 LAB — MICROALBUMIN / CREATININE URINE RATIO
Creatinine, Urine: 106.1 mg/dL
Microalb/Creat Ratio: 12 mg/g creat (ref 0–29)
Microalbumin, Urine: 12.6 ug/mL

## 2022-11-14 LAB — COMPREHENSIVE METABOLIC PANEL
ALT: 29 IU/L (ref 0–44)
AST: 25 IU/L (ref 0–40)
Albumin/Globulin Ratio: 1.7 (ref 1.2–2.2)
Albumin: 4.3 g/dL (ref 3.9–4.9)
Alkaline Phosphatase: 73 IU/L (ref 44–121)
BUN/Creatinine Ratio: 12 (ref 10–24)
BUN: 14 mg/dL (ref 8–27)
Bilirubin Total: 0.8 mg/dL (ref 0.0–1.2)
CO2: 21 mmol/L (ref 20–29)
Calcium: 9.3 mg/dL (ref 8.6–10.2)
Chloride: 100 mmol/L (ref 96–106)
Creatinine, Ser: 1.2 mg/dL (ref 0.76–1.27)
Globulin, Total: 2.5 g/dL (ref 1.5–4.5)
Glucose: 69 mg/dL — ABNORMAL LOW (ref 70–99)
Potassium: 4.2 mmol/L (ref 3.5–5.2)
Sodium: 136 mmol/L (ref 134–144)
Total Protein: 6.8 g/dL (ref 6.0–8.5)
eGFR: 69 mL/min/{1.73_m2} (ref >59)

## 2022-11-14 LAB — PSA: Prostate Specific Ag, Serum: 0.5 ng/mL (ref 0.0–4.0)

## 2022-11-14 LAB — HEMOGLOBIN A1C
Est. average glucose Bld gHb Est-mCnc: 137 mg/dL
Hgb A1c MFr Bld: 6.4 % — ABNORMAL HIGH (ref 4.8–5.6)

## 2022-11-15 ENCOUNTER — Ambulatory Visit
Admission: RE | Admit: 2022-11-15 | Discharge: 2022-11-15 | Disposition: A | Discharge status: Home / Self Care | Payer: 59 | Source: Ambulatory Visit | Attending: Neurology | Admitting: Neurology

## 2022-11-15 DIAGNOSIS — G459 Transient cerebral ischemic attack, unspecified: Secondary | ICD-10-CM | POA: Diagnosis present

## 2022-11-15 DIAGNOSIS — H814 Vertigo of central origin: Secondary | ICD-10-CM | POA: Diagnosis present

## 2022-11-15 DIAGNOSIS — I63211 Cerebral infarction due to unspecified occlusion or stenosis of right vertebral arteries: Secondary | ICD-10-CM

## 2022-11-15 MED ORDER — IOHEXOL 350 MG/ML SOLN
75.0000 mL | Freq: Once | INTRAVENOUS | Status: AC | PRN
  Administered 2022-11-15: 75 mL via INTRAVENOUS

## 2022-11-15 MED ORDER — GADOBUTROL 1 MMOL/ML IV SOLN
7.5000 mL | Freq: Once | INTRAVENOUS | Status: AC | PRN
  Administered 2022-11-15: 7.5 mL via INTRAVENOUS

## 2022-11-19 ENCOUNTER — Other Ambulatory Visit: Payer: Self-pay | Admitting: Internal Medicine

## 2022-11-20 ENCOUNTER — Encounter: Payer: Self-pay | Admitting: Neurology

## 2022-11-21 ENCOUNTER — Telehealth (HOSPITAL_COMMUNITY): Payer: Self-pay

## 2022-11-21 ENCOUNTER — Other Ambulatory Visit (HOSPITAL_COMMUNITY): Payer: Self-pay | Admitting: Neuroradiology

## 2022-11-21 DIAGNOSIS — R42 Dizziness and giddiness: Secondary | ICD-10-CM

## 2022-11-21 DIAGNOSIS — G450 Vertebro-basilar artery syndrome: Secondary | ICD-10-CM

## 2022-11-21 NOTE — Telephone Encounter (Signed)
Called to schedule diagnostic angiogram w/Dr. Quay Burow, no answer, left vm. AB

## 2022-11-21 NOTE — Telephone Encounter (Signed)
I called the patient, still experiencing dizziness when looking up or down, for example cannot vacuum or change of April. I talked with Dr. Quay Burow, will schedule him for catheter angiogram. MRI of the brain is reassuring. No stroke or acute problems was seen. CTA head and neck showed bilateral patent carotid stents. Could not exclude distal in stent stenosis to V1 due to artifact in the stent walls surrounding densely calcified plaque, and vessel size.

## 2022-11-27 ENCOUNTER — Other Ambulatory Visit: Payer: Self-pay | Admitting: Physician Assistant

## 2022-11-27 DIAGNOSIS — Z01818 Encounter for other preprocedural examination: Secondary | ICD-10-CM

## 2022-11-28 ENCOUNTER — Encounter (HOSPITAL_COMMUNITY): Payer: Self-pay

## 2022-11-28 ENCOUNTER — Other Ambulatory Visit (HOSPITAL_COMMUNITY): Payer: Self-pay | Admitting: Neuroradiology

## 2022-11-28 ENCOUNTER — Ambulatory Visit (HOSPITAL_COMMUNITY)
Admission: RE | Admit: 2022-11-28 | Discharge: 2022-11-28 | Disposition: A | Discharge status: Home / Self Care | Payer: 59 | Source: Ambulatory Visit | Attending: Neuroradiology | Admitting: Neuroradiology

## 2022-11-28 DIAGNOSIS — Z01818 Encounter for other preprocedural examination: Secondary | ICD-10-CM

## 2022-11-28 DIAGNOSIS — G450 Vertebro-basilar artery syndrome: Secondary | ICD-10-CM

## 2022-11-28 DIAGNOSIS — R42 Dizziness and giddiness: Secondary | ICD-10-CM

## 2022-11-28 HISTORY — PX: IR ANGIO INTRA EXTRACRAN SEL COM CAROTID INNOMINATE BILAT MOD SED: IMG5360

## 2022-11-28 HISTORY — PX: IR ANGIO VERTEBRAL SEL VERTEBRAL BILAT MOD SED: IMG5369

## 2022-11-28 HISTORY — PX: IR US GUIDE VASC ACCESS RIGHT: IMG2390

## 2022-11-28 LAB — BASIC METABOLIC PANEL
Anion gap: 14 (ref 5–15)
BUN: 16 mg/dL (ref 8–23)
CO2: 21 mmol/L — ABNORMAL LOW (ref 22–32)
Calcium: 9.1 mg/dL (ref 8.9–10.3)
Chloride: 100 mmol/L (ref 98–111)
Creatinine, Ser: 1.19 mg/dL (ref 0.61–1.24)
GFR, Estimated: >60 mL/min (ref >60)
Glucose, Bld: 84 mg/dL (ref 70–99)
Potassium: 4 mmol/L (ref 3.5–5.1)
Sodium: 135 mmol/L (ref 135–145)

## 2022-11-28 LAB — CBC
HCT: 43 % (ref 39.0–52.0)
Hemoglobin: 15.4 g/dL (ref 13.0–17.0)
MCH: 32.8 pg (ref 26.0–34.0)
MCHC: 35.8 g/dL (ref 30.0–36.0)
MCV: 91.7 fL (ref 80.0–100.0)
Platelets: 234 10*3/uL (ref 150–400)
RBC: 4.69 MIL/uL (ref 4.22–5.81)
RDW: 12.5 % (ref 11.5–15.5)
WBC: 7.1 10*3/uL (ref 4.0–10.5)
nRBC: 0 % (ref 0.0–0.2)

## 2022-11-28 LAB — PROTIME-INR
INR: 1.1 (ref 0.8–1.2)
Prothrombin Time: 13.8 seconds (ref 11.4–15.2)

## 2022-11-28 MED ORDER — MIDAZOLAM HCL 2 MG/2ML IJ SOLN
INTRAMUSCULAR | Status: AC | PRN
  Administered 2022-11-28: .5 mg via INTRAVENOUS

## 2022-11-28 MED ORDER — HEPARIN SODIUM (PORCINE) 1000 UNIT/ML IJ SOLN
INTRAMUSCULAR | Status: AC
  Filled 2022-11-28: qty 10

## 2022-11-28 MED ORDER — LIDOCAINE HCL 1 % IJ SOLN
10.0000 mL | Freq: Once | INTRAMUSCULAR | Status: AC
  Administered 2022-11-28: 10 mL via INTRADERMAL

## 2022-11-28 MED ORDER — MIDAZOLAM HCL 2 MG/2ML IJ SOLN
INTRAMUSCULAR | Status: AC
  Filled 2022-11-28: qty 2

## 2022-11-28 MED ORDER — IOHEXOL 300 MG/ML  SOLN
150.0000 mL | Freq: Once | INTRAMUSCULAR | Status: AC | PRN
  Administered 2022-11-28: 120 mL via INTRA_ARTERIAL

## 2022-11-28 MED ORDER — SODIUM CHLORIDE 0.9 % IV SOLN: INTRAVENOUS | Status: DC

## 2022-11-28 MED ORDER — FENTANYL CITRATE (PF) 100 MCG/2ML IJ SOLN
INTRAMUSCULAR | Status: AC
  Filled 2022-11-28: qty 2

## 2022-11-28 MED ORDER — FENTANYL CITRATE (PF) 100 MCG/2ML IJ SOLN
INTRAMUSCULAR | Status: AC | PRN
  Administered 2022-11-28: 25 ug via INTRAVENOUS

## 2022-11-28 MED ORDER — LIDOCAINE HCL 1 % IJ SOLN
INTRAMUSCULAR | Status: AC
  Filled 2022-11-28: qty 20

## 2022-11-28 NOTE — H&P (Addendum)
Chief Complaint: Patient was seen in consultation today for persistent dizziness concerning for possible left vertebral artery stent stenosis.   Supervising Physician: Baldemar Lenis  Patient Status: [facility] - Out-pt  History of Present Illness: Calvin Smith is a 62 y.o. male   Known to NIR Previous Bilateral angioplasty/stent Carotid arteries and left vertebral artery ostium; Aug - October 2023 Taking ASA daily Persistent dizziness per pt Denies numbness/tingling Denies vision or speech changes Denies N/V   Was seen with Neurology Imaging ordered  IMPRESSION: MRI 11/15/22 1. No acute intracranial abnormality. 2. Mild chronic small vessel ischemic disease.  IMPRESSION: CTA 11/15/22 1. Patent bilateral carotid artery stents. No evidence of a significant in stent stenosis. 2. Patent left V1 stent. Detailed assessment is limited by CTA, however a moderate to severe distal in stent stenosis is not excluded. 3. Unchanged severe right vertebral artery origin stenosis. 4. No significant proximal intracranial arterial stenosis.  Dr Quay Burow recommending Cerebral arteriogram Schedule now for same    Past Medical History:  Diagnosis Date   Allergic rhinitis    Arm fracture, left as a child   DeQuervain's disease (tenosynovitis)    right wrist   Diabetes mellitus    type 2   Erectile dysfunction    GERD (gastroesophageal reflux disease)    Hyperlipidemia    Hypertension    Migraines    Peripheral vascular disease    Sleep apnea    uses nightly   Stroke 03/21/2022    Past Surgical History:  Procedure Laterality Date   Cardiolite  5/06   Negative EF 66%   CARPAL TUNNEL RELEASE  9/03   left    IR ANGIO INTRA EXTRACRAN SEL COM CAROTID INNOMINATE BILAT MOD SED  03/23/2022   IR ANGIO INTRA EXTRACRAN SEL COM CAROTID INNOMINATE BILAT MOD SED  05/24/2022   IR ANGIO INTRA EXTRACRAN SEL COM CAROTID INNOMINATE UNI L MOD SED  04/17/2022   IR ANGIO  VERTEBRAL SEL SUBCLAVIAN INNOMINATE BILAT MOD SED  03/23/2022   IR ANGIO VERTEBRAL SEL SUBCLAVIAN INNOMINATE UNI L MOD SED  04/17/2022   IR ANGIO VERTEBRAL SEL SUBCLAVIAN INNOMINATE UNI L MOD SED  05/24/2022   IR ANGIO VERTEBRAL SEL VERTEBRAL UNI L MOD SED  03/29/2022   IR ANGIOGRAM EXTREMITY LEFT  03/29/2022   IR ANGIOGRAM FOLLOW UP STUDY  05/24/2022   IR INTRAVSC STENT CERV CAROTID W/EMB-PROT MOD SED INCL ANGIO  04/17/2022   IR INTRAVSC STENT CERV CAROTID W/EMB-PROT MOD SED INCL ANGIO  05/24/2022   IR TRANSCATH EXCRAN VERT OR CAR A STENT  03/24/2022   IR US GUIDE VASC ACCESS RIGHT  03/23/2022   IR US GUIDE VASC ACCESS RIGHT  03/24/2022   IR US GUIDE VASC ACCESS RIGHT  04/17/2022   IR US GUIDE VASC ACCESS RIGHT  05/24/2022   RADIOLOGY WITH ANESTHESIA Left 03/24/2022   Procedure: IR WITH ANESTHESIA;  Surgeon: Baldemar Lenis, MD;  Location: [facility] OR;  Service: Radiology;  Laterality: Left;   RADIOLOGY WITH ANESTHESIA N/A 04/17/2022   Procedure: LEFT CAROTID STENT;  Surgeon: Baldemar Lenis, MD;  Location: [facility] OR;  Service: Radiology;  Laterality: N/A;   RADIOLOGY WITH ANESTHESIA N/A 05/24/2022   Procedure: Right carotid angioplasty with possible stenting;  Surgeon: de Glori Luis, MD;  Location: [facility] OR;  Service: Radiology;  Laterality: N/A;   WRIST SURGERY  1/04   DeQuervain release   WRIST SURGERY  1/08   ORIF l wrist DVR  plate screwhead autologous    Allergies: Bisoprolol-hydrochlorothiazide and Codeine  Medications: Prior to Admission medications   Medication Sig Start Date End Date Taking? Authorizing Provider  aspirin EC 81 MG tablet Take 81 mg by mouth daily.   Yes [provider]  atorvastatin (LIPITOR) 80 MG tablet TAKE 1 TABLET BY MOUTH DAILY 07/10/22  Yes Tillman Abide I, MD  glipiZIDE (GLUCOTROL XL) 5 MG 24 hr tablet Take 5 mg by mouth in the morning and at bedtime.   Yes [provider]  ibuprofen (ADVIL) 200 MG tablet Take  400-600 mg by mouth every 8 (eight) hours as needed for headache.   Yes [provider]  insulin glargine (LANTUS SOLOSTAR) 100 UNIT/ML Solostar Pen Inject 42 Units into the skin 2 (two) times daily.   Yes [provider]  JARDIANCE 25 MG TABS tablet TAKE 1 TABLET BY MOUTH DAILY 08/15/22  Yes Tillman Abide I, MD  lisinopril (ZESTRIL) 20 MG tablet TAKE 1 TABLET BY MOUTH DAILY 08/15/22  Yes Tillman Abide I, MD  metFORMIN (GLUCOPHAGE) 1000 MG tablet TAKE 1 TABLET BY MOUTH TWICE  DAILY WITH MEALS 09/01/22  Yes Karie Schwalbe, MD  pantoprazole (PROTONIX) 40 MG tablet Take 1 tablet (40 mg total) by mouth 2 (two) times daily. 08/16/22  Yes Karie Schwalbe, MD  Semaglutide, 2 MG/DOSE, (OZEMPIC, 2 MG/DOSE,) 8 MG/3ML SOPN Inject 2 mg into the skin every Saturday.   Yes [provider]  tamsulosin (FLOMAX) 0.4 MG CAPS capsule Take 1 capsule (0.4 mg total) by mouth daily. 11/13/22  Yes Karie Schwalbe, MD  Ubrogepant (UBRELVY) 100 MG TABS Take 100 mg by mouth as needed (Take 1 tablet at onset of headache, may repeat in 2 hours if needed, max is 200 mg in 24 hours). 04/26/22  Yes Glean Salvo, NP  CONTOUR NEXT TEST test strip USE TO CHECK BLOOD SUGAR DAILY 11/20/22   Tillman Abide I, MD  Insulin Pen Needle (BD PEN NEEDLE NANO U/F) 32G X 4 MM MISC USE ONCE DAILY 09/01/22   Karie Schwalbe, MD  sildenafil (VIAGRA) 100 MG tablet 1/2 to 1 tab by mouth daily as needed. 10/18/22   Karie Schwalbe, MD     Family History  Problem Relation Age of Onset   Hypertension Father    Lymphoma Father        non-hodgkins   Uterine cancer Maternal Grandmother    Hypertension Paternal Grandfather    Lung cancer Paternal Aunt    Coronary artery disease Paternal Uncle    Colon cancer Unknown        in 1 relative (?aunt)    Social History   Socioeconomic History   Marital status: Married    Spouse name: Not on file   Number of children: Not on file   Years of education: Not on file    Highest education level: Not on file  Occupational History   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  Tobacco Use   Smoking status: Never    Passive exposure: Past   Smokeless tobacco: Never  Vaping Use   Vaping Use: Never used  Substance and Sexual Activity   Alcohol use: Not Currently    Comment: rare   Drug use: No   Sexual activity: Yes    Partners: Female  Other Topics Concern   Not on file  Social History Narrative   Regular exercise: light to moderate  Caffeine use: coffee daily   Social Determinants of Health   Financial Resource Strain: Not on file  Food Insecurity: Not on file  Transportation Needs: Not on file  Physical Activity: Not on file  Stress: Not on file  Social Connections: Not on file     Review of Systems: A 12 point ROS discussed and pertinent positives are indicated in the HPI above.  All other systems are negative.  Review of Systems  Constitutional:  Negative for activity change, fatigue and fever.  HENT:  Negative for tinnitus and trouble swallowing.   Eyes:  Negative for photophobia and visual disturbance.  Respiratory:  Negative for cough and shortness of breath.   Cardiovascular:  Negative for chest pain.  Gastrointestinal:  Negative for abdominal pain, nausea and vomiting.  Musculoskeletal:  Negative for back pain and gait problem.  Neurological:  Positive for dizziness. Negative for tremors, seizures, syncope, facial asymmetry, speech difficulty, weakness, light-headedness, numbness and headaches.  Psychiatric/Behavioral:  Negative for behavioral problems and confusion.     Vital Signs: BP 134/71   Pulse 87   Temp (!) 97.2 F (36.2 C) (Temporal)   Resp 18   Ht 5' 6.5" (1.689 m)   Wt 174 lb (78.9 kg)   SpO2 96%   BMI 27.66 kg/m     Physical Exam Vitals reviewed.  HENT:     Mouth/Throat:     Mouth: Mucous membranes are moist.  Eyes:     Extraocular Movements: Extraocular movements  intact.  Cardiovascular:     Rate and Rhythm: Normal rate and regular rhythm.     Heart sounds: Normal heart sounds.  Pulmonary:     Effort: Pulmonary effort is normal.     Breath sounds: Normal breath sounds. No wheezing.  Abdominal:     Palpations: Abdomen is soft.     Tenderness: There is no abdominal tenderness.  Musculoskeletal:        General: No swelling. Normal range of motion.     Right lower leg: No edema.     Left lower leg: No edema.  Skin:    General: Skin is warm.  Neurological:     Mental Status: He is alert and oriented to person, place, and time.  Psychiatric:        Behavior: Behavior normal.     Imaging: MR BRAIN W WO CONTRAST  Result Date: 11/17/2022 CLINICAL DATA:  Headache. History of bilateral cervical carotid and left vertebral artery stenting. EXAM: MRI HEAD WITHOUT AND WITH CONTRAST TECHNIQUE: Multiplanar, multiecho pulse sequences of the brain and surrounding structures were obtained without and with intravenous contrast. CONTRAST:  7.93mL GADAVIST GADOBUTROL 1 MMOL/ML IV SOLN COMPARISON:  Head and neck CTA 11/15/2022.  Head MRI 03/22/2022. FINDINGS: Brain: There is no evidence of an acute infarct, intracranial hemorrhage, mass, midline shift, or extra-axial fluid collection. The ventricles and sulci are within normal limits for age. Scattered small T2 hyperintensities in the cerebral white matter bilaterally are unchanged from the prior MRI and are nonspecific but compatible with mild chronic small vessel ischemic disease. No abnormal enhancement is identified. No focal cerebellar insult is evident. Vascular: Major intracranial vascular flow voids are preserved. Skull and upper cervical spine: Unremarkable bone marrow signal. Sinuses/Orbits: Unremarkable orbits. Mild mucosal thickening in the ethmoid sinuses. Clear mastoid air cells. Other: None. IMPRESSION: 1. No acute intracranial abnormality. 2. Mild chronic small vessel ischemic disease. Electronically Signed    By: Sebastian Ache M.D.   On: 11/17/2022 11:17  CT ANGIO HEAD NECK W WO CM  Result Date: 11/17/2022 CLINICAL DATA:  Vertigo, central. History of bilateral cervical carotid and left vertebral artery angioplasty and stenting. EXAM: CT ANGIOGRAPHY HEAD AND NECK WITH AND WITHOUT CONTRAST TECHNIQUE: Multidetector CT imaging of the head and neck was performed using the standard protocol during bolus administration of intravenous contrast. Multiplanar CT image reconstructions and MIPs were obtained to evaluate the vascular anatomy. Carotid stenosis measurements (when applicable) are obtained utilizing NASCET criteria, using the distal internal carotid diameter as the denominator. RADIATION DOSE REDUCTION: This exam was performed according to the departmental dose-optimization program which includes automated exposure control, adjustment of the mA and/or kV according to patient size and/or use of iterative reconstruction technique. CONTRAST:  75mL OMNIPAQUE IOHEXOL 350 MG/ML SOLN COMPARISON:  Head MRI 11/15/2022 and 03/22/2022. Head and neck CTA 03/22/2022. FINDINGS: CT HEAD FINDINGS Brain: There is no evidence of an acute infarct, intracranial hemorrhage, mass, midline shift, or extra-axial fluid collection. The ventricles and sulci are within normal limits for age. Vascular: Calcified atherosclerosis at the skull base. Skull: No fracture or suspicious osseous lesion. Sinuses/Orbits: Paranasal sinuses and mastoid air cells are clear. Unremarkable orbits. Other: None. Review of the MIP images confirms the above findings CTA NECK FINDINGS Aortic arch: Standard 3 vessel aortic arch with mild atherosclerotic plaque. No stenosis of the arch vessel origins. Right carotid system: Patent with a stent spanning the distal common and proximal internal carotid arteries. No evidence of a significant in stent stenosis or stenosis proximal or distal to the stent. Left carotid system: Patent with a stent spanning the distal common and  proximal internal carotid arteries. No evidence of a significant in stent stenosis or stenosis proximal or distal to the stent. Vertebral arteries: Patent with the left being dominant. A stent in the proximal left V1 segment is patent, however accurate assessment for a potential in stent stenosis is challenging due to artifact from the stent walls, surrounding densely calcified plaque, and vessel size. Narrowed appearance of the stent by CTA is most prominent distally, and a moderate to severe distal in stent stenosis is not excluded. Right V1 segment atherosclerosis is again noted including a severe stenosis of the vertebral origin. Skeleton: No suspicious osseous lesion. Other neck: No evidence of cervical lymphadenopathy or mass. Upper chest: Clear lung apices. Review of the MIP images confirms the above findings CTA HEAD FINDINGS Anterior circulation: Internal carotid arteries are patent from skull base to carotid termini with mild atherosclerotic plaque not resulting in significant stenosis. ACAs and MCAs are patent with mild branch vessel irregularity but no evidence of a proximal branch occlusion or significant proximal stenosis. No aneurysm is identified. Posterior circulation: The intracranial vertebral arteries are patent to the basilar with no significant stenosis of the dominant left V4 segment. Moderate right V4 stenosis is similar to the prior CTA. Patent PICA, AICA, and SCA origins are seen bilaterally. The basilar artery is widely patent. There is a moderate-sized right posterior communicating artery. The PCAs are patent with branch vessel irregularity but no significant proximal stenosis. No aneurysm is identified. Venous sinuses: As permitted by contrast timing, patent. Anatomic variants: None. Review of the MIP images confirms the above findings IMPRESSION: 1. Patent bilateral carotid artery stents. No evidence of a significant in stent stenosis. 2. Patent left V1 stent. Detailed assessment is  limited by CTA, however a moderate to severe distal in stent stenosis is not excluded. 3. Unchanged severe right vertebral artery origin stenosis. 4. No significant  proximal intracranial arterial stenosis. 5.  Aortic Atherosclerosis (ICD10-I70.0). Electronically Signed   By: Sebastian Ache M.D.   On: 11/17/2022 11:14   DG Hip Unilat W OR W/O Pelvis Min 4 Views Left  Result Date: 11/14/2022 CLINICAL DATA:  Pain.  Prior donor site on left hip. EXAM: DG HIP (WITH OR WITHOUT PELVIS) 4+V LEFT COMPARISON:  KUB 03/14/2017 FINDINGS: The bilateral sacroiliac joint spaces are maintained. Mild bilateral sacroiliac subchondral sclerosis. The bilateral femoroacetabular and pubic symphysis joint spaces are maintained. Normal morphology of left femoral head-neck junction without CAM-type bump deformity. Unchanged mild irregularity of the anterior superior left iliac crest, possibly from prior reported bone marrow donation site versus chronic enthesopathic change at the transverse abdominal and/or rectus femoris tendon insertions. No acute fracture or dislocation. Mild atherosclerotic calcifications. Mild vascular phleboliths provide left hemipelvis. IMPRESSION: 1. Mild bilateral sacroiliac subchondral sclerosis, likely degenerative. 2. No acute bony abnormality. Electronically Signed   By: Neita Garnet M.D.   On: 11/14/2022 10:12    Labs:  CBC: Recent Labs    03/23/22 0240 04/17/22 0709 05/24/22 0703 11/13/22 1055  WBC 8.1 7.4 6.6 7.6  HGB 14.2 14.9 14.7 15.4  HCT 38.9* 41.9 42.1 44.0  PLT 194 222 227 246    COAGS: Recent Labs    03/21/22 1929 04/17/22 0709 05/24/22 0703  INR 1.1 1.1 1.1  APTT 28  --   --     BMP: Recent Labs    03/22/22 2220 03/23/22 0240 04/17/22 0709 05/24/22 0703 10/31/22 1114 11/13/22 1055  NA 129* 130* 132* 138 137 136  K 4.0 3.9 4.0 3.9 4.6 4.2  CL 98 100 99 105 98 100  CO2 23 22 23 23 21 21   GLUCOSE 171* 100* 129* 133* 72 69*  BUN 11 10 18 15 13 14   CALCIUM 8.5*  8.5* 8.7* 9.4 9.8 9.3  CREATININE 1.03 0.99 1.14 1.16 1.25 1.20  GFRNONAA >60 >60 >60 >60  --   --     LIVER FUNCTION TESTS: Recent Labs    03/21/22 1929 03/22/22 2220 03/23/22 0240 11/13/22 1055  BILITOT 1.8* 1.8* 1.5* 0.8  AST 36 33 30 25  ALT 41 45* 40 29  ALKPHOS 69 60 57 73  PROT 7.4 6.6 6.3* 6.8  ALBUMIN 4.3 3.6 3.4* 4.3    TUMOR MARKERS: No results for input(s): "AFPTM", "CEA", "CA199", "CHROMGRNA" in the last 8760 hours.  Assessment and Plan:  Scheduled for Cerebral arteriogram in NIR today Risks and benefits of cerebral angiogram with intervention were discussed with the patient including, but not limited to bleeding, infection, vascular injury, contrast induced renal failure, stroke or even death.  This interventional procedure involves the use of X-rays and because of the nature of the planned procedure, it is possible that we will have prolonged use of X-ray fluoroscopy.  Potential radiation risks to you include (but are not limited to) the following: - A slightly elevated risk for cancer  several years later in life. This risk is typically less than 0.5% percent. This risk is low in comparison to the normal incidence of human cancer, which is 33% for women and 50% for men according to the [employer] Society. - Radiation induced injury can include skin redness, resembling a rash, tissue breakdown / ulcers and hair loss (which can be temporary or permanent).   The likelihood of either of these occurring depends on the difficulty of the procedure and whether you are sensitive to radiation due to previous procedures,  disease, or genetic conditions.   IF your procedure requires a prolonged use of radiation, you will be notified and given written instructions for further action.  It is your responsibility to monitor the irradiated area for the 2 weeks following the procedure and to notify your physician if you are concerned that you have suffered a radiation induced  injury.    All of the patient's questions were answered, patient is agreeable to proceed.  Consent signed and in chart.  Thank you for this interesting consult.  I greatly enjoyed meeting NOCHUM FENTER and look forward to participating in their care.  A copy of this report was sent to the requesting provider on this date.  Electronically Signed: Robet Leu, PA-C 11/28/2022, 7:40 AM   I spent a total of    25 Minutes in face to face in clinical consultation, greater than 50% of which was counseling/coordinating care for Cerebral arteriogram

## 2022-11-28 NOTE — Procedures (Signed)
INTERVENTIONAL NEURORADIOLOGY BRIEF POSTPROCEDURE NOTE   DIAGNOSTIC CEREBRAL ANGIOGRAM    Attending: Dr. Baldemar Lenis   Diagnosis: Vertebrobasilar insufficiency symptoms    Access site: Right common femoral artery    Access closure: 5 [place] ExoSeal    Anesthesia: Moderate sedation    Medication used: 0.5 Mg Versed IV; 25 mcg Fentanyl IV.   Complications: None    Estimated blood loss: None    Specimen: None    Findings:  Status post stenting of the proximal left vertebral artery noting mildly increased tortuosity of the distal stent which is worsened by left lateral neck flexion resulting in approximately 60% stenosis.  Status post bilateral cervical carotid stenting with widely patent implant bilaterally. Approximately 60% stenosis of the origin of the right vertebral artery.   The patient tolerated the procedure well without incident or complication and is in stable condition.

## 2022-11-30 ENCOUNTER — Other Ambulatory Visit (HOSPITAL_COMMUNITY): Payer: Self-pay | Admitting: Radiology

## 2022-11-30 ENCOUNTER — Ambulatory Visit (HOSPITAL_COMMUNITY)
Admission: RE | Admit: 2022-11-30 | Discharge: 2022-11-30 | Disposition: A | Discharge status: Home / Self Care | Payer: 59 | Source: Ambulatory Visit | Attending: Neuroradiology | Admitting: Neuroradiology

## 2022-11-30 DIAGNOSIS — G450 Vertebro-basilar artery syndrome: Secondary | ICD-10-CM

## 2022-11-30 DIAGNOSIS — R42 Dizziness and giddiness: Secondary | ICD-10-CM

## 2022-11-30 NOTE — Progress Notes (Signed)
Referring Physician(s): de Macedo Rodrigues,Shulamit Donofrio  Chief Complaint: The patient is seen in follow up today s/p cerebral angiogram.  History of present illness:  62 year old male with past medical history significant for diabetes, hypertension, hyperlipidemia, migraines, sleep apnea who presented to the ED for evaluation after an episode of ataxic gait, vertigo and nausea. Symptoms resolved after 20 minutes. CT of the head without contrast showed no acute intracranial abnormality. CT angiogram of the head and neck showed multifocal severe stenosis in bilateral carotid bifurcation and origin of the vertebral arteries. He underwent uneventful stenting of the left vertebral artery ostium on March 24, 2022, left carotid angioplasty and stenting with cerebral protection device on April 17, 2022 and right carotid angioplasty and stenting with cerebral protection device on 05/24/2022. He complains of lightheadedness, dizziness and headache when moving head up and down and to the left side. He comes today to review results of the recent angiogram.  Past Medical History:  Diagnosis Date   Allergic rhinitis    Arm fracture, left as a child   DeQuervain's disease (tenosynovitis)    right wrist   Diabetes mellitus    type 2   Erectile dysfunction    GERD (gastroesophageal reflux disease)    Hyperlipidemia    Hypertension    Migraines    Peripheral vascular disease    Sleep apnea    uses nightly   Stroke 03/21/2022    Past Surgical History:  Procedure Laterality Date   Cardiolite  5/06   Negative EF 66%   CARPAL TUNNEL RELEASE  9/03   left    IR ANGIO INTRA EXTRACRAN SEL COM CAROTID INNOMINATE BILAT MOD SED  03/23/2022   IR ANGIO INTRA EXTRACRAN SEL COM CAROTID INNOMINATE BILAT MOD SED  05/24/2022   IR ANGIO INTRA EXTRACRAN SEL COM CAROTID INNOMINATE BILAT MOD SED  11/28/2022   IR ANGIO INTRA EXTRACRAN SEL COM CAROTID INNOMINATE UNI L MOD SED  04/17/2022   IR ANGIO VERTEBRAL SEL  SUBCLAVIAN INNOMINATE BILAT MOD SED  03/23/2022   IR ANGIO VERTEBRAL SEL SUBCLAVIAN INNOMINATE UNI L MOD SED  04/17/2022   IR ANGIO VERTEBRAL SEL SUBCLAVIAN INNOMINATE UNI L MOD SED  05/24/2022   IR ANGIO VERTEBRAL SEL VERTEBRAL BILAT MOD SED  11/28/2022   IR ANGIO VERTEBRAL SEL VERTEBRAL UNI L MOD SED  03/29/2022   IR ANGIOGRAM EXTREMITY LEFT  03/29/2022   IR ANGIOGRAM FOLLOW UP STUDY  05/24/2022   IR INTRAVSC STENT CERV CAROTID W/EMB-PROT MOD SED INCL ANGIO  04/17/2022   IR INTRAVSC STENT CERV CAROTID W/EMB-PROT MOD SED INCL ANGIO  05/24/2022   IR TRANSCATH EXCRAN VERT OR CAR A STENT  03/24/2022   IR US GUIDE VASC ACCESS RIGHT  03/23/2022   IR US GUIDE VASC ACCESS RIGHT  03/24/2022   IR US GUIDE VASC ACCESS RIGHT  04/17/2022   IR US GUIDE VASC ACCESS RIGHT  05/24/2022   IR US GUIDE VASC ACCESS RIGHT  11/28/2022   RADIOLOGY WITH ANESTHESIA Left 03/24/2022   Procedure: IR WITH ANESTHESIA;  Surgeon: Baldemar Lenis, MD;  Location: [facility];  Service: Radiology;  Laterality: Left;   RADIOLOGY WITH ANESTHESIA N/A 04/17/2022   Procedure: LEFT CAROTID STENT;  Surgeon: Baldemar Lenis, MD;  Location: [facility] OR;  Service: Radiology;  Laterality: N/A;   RADIOLOGY WITH ANESTHESIA N/A 05/24/2022   Procedure: Right carotid angioplasty with possible stenting;  Surgeon: de Glori Luis, MD;  Location: [facility] OR;  Service: Radiology;  Laterality:  N/A;   WRIST SURGERY  1/04   DeQuervain release   WRIST SURGERY  1/08   ORIF l wrist DVR plate screwhead autologous    Allergies: Bisoprolol-hydrochlorothiazide and Codeine  Medications: Prior to Admission medications   Medication Sig Start Date End Date Taking? Authorizing Provider  aspirin EC 81 MG tablet Take 81 mg by mouth daily.    [provider]  atorvastatin (LIPITOR) 80 MG tablet TAKE 1 TABLET BY MOUTH DAILY 07/10/22   Karie Schwalbe, MD  CONTOUR NEXT TEST test strip USE TO CHECK BLOOD SUGAR DAILY 11/20/22   Karie Schwalbe, MD  glipiZIDE (GLUCOTROL XL) 5 MG 24 hr tablet Take 5 mg by mouth in the morning and at bedtime.    [provider]  ibuprofen (ADVIL) 200 MG tablet Take 400-600 mg by mouth every 8 (eight) hours as needed for headache.    [provider]  insulin glargine (LANTUS SOLOSTAR) 100 UNIT/ML Solostar Pen Inject 42 Units into the skin 2 (two) times daily.    [provider]  Insulin Pen Needle (BD PEN NEEDLE NANO U/F) 32G X 4 MM MISC USE ONCE DAILY 09/01/22   Tillman Abide I, MD  JARDIANCE 25 MG TABS tablet TAKE 1 TABLET BY MOUTH DAILY 08/15/22   Tillman Abide I, MD  lisinopril (ZESTRIL) 20 MG tablet TAKE 1 TABLET BY MOUTH DAILY 08/15/22   Tillman Abide I, MD  metFORMIN (GLUCOPHAGE) 1000 MG tablet TAKE 1 TABLET BY MOUTH TWICE  DAILY WITH MEALS 09/01/22   Karie Schwalbe, MD  pantoprazole (PROTONIX) 40 MG tablet Take 1 tablet (40 mg total) by mouth 2 (two) times daily. 08/16/22   Karie Schwalbe, MD  Semaglutide, 2 MG/DOSE, (OZEMPIC, 2 MG/DOSE,) 8 MG/3ML SOPN Inject 2 mg into the skin every Saturday.    [provider]  sildenafil (VIAGRA) 100 MG tablet 1/2 to 1 tab by mouth daily as needed. 10/18/22   Karie Schwalbe, MD  tamsulosin (FLOMAX) 0.4 MG CAPS capsule Take 1 capsule (0.4 mg total) by mouth daily. 11/13/22   Karie Schwalbe, MD  Ubrogepant (UBRELVY) 100 MG TABS Take 100 mg by mouth as needed (Take 1 tablet at onset of headache, may repeat in 2 hours if needed, max is 200 mg in 24 hours). 04/26/22   Glean Salvo, NP     Family History  Problem Relation Age of Onset   Hypertension Father    Lymphoma Father        non-hodgkins   Uterine cancer Maternal Grandmother    Hypertension Paternal Grandfather    Lung cancer Paternal Aunt    Coronary artery disease Paternal Uncle    Colon cancer Unknown        in 1 relative (?aunt)    Social History   Socioeconomic History   Marital status: Married    Spouse name: Not on file   Number of  children: Not on file   Years of education: Not on file   Highest education level: Not on file  Occupational History   Occupation: SUPERVISOR    Employer: [employer]    Comment: immunoassay dept  Tobacco Use   Smoking status: Never    Passive exposure: Past   Smokeless tobacco: Never  Vaping Use   Vaping Use: Never used  Substance and Sexual Activity   Alcohol use: Not Currently    Comment: rare   Drug use: No   Sexual activity: Yes    Partners:  Female  Other Topics Concern   Not on file  Social History Narrative   Regular exercise: light to moderate   Caffeine use: coffee daily   Social Determinants of Health   Financial Resource Strain: Not on file  Food Insecurity: Not on file  Transportation Needs: Not on file  Physical Activity: Not on file  Stress: Not on file  Social Connections: Not on file     Vital Signs: There were no vitals taken for this visit.  Physical Exam  Imaging: IR ANGIO INTRA EXTRACRAN SEL COM CAROTID INNOMINATE BILAT MOD SED  Result Date: 11/28/2022 INDICATION: Mr. Schertzer 62 year old male with severe cerebrovascular atherosclerotic disease status post bilateral cervical carotid stenting and left vertebral artery ostium stenting. Has new onset of intermittent vertical and dizziness which is related to head positioning. He returns today for a diagnostic cerebral angiogram to evaluate for implant stenosis. EXAM: ULTRASOUND-GUIDED VASCULAR ACCESS DIAGNOSTIC CEREBRAL ANGIOGRAM COMPARISON:  Cerebral angiograms May 24, 2022 and priors. MEDICATIONS: No antibiotics administered. ANESTHESIA/SEDATION: Moderate (conscious) sedation was employed during this procedure. A total of Versed 0.5 mg and Fentanyl 25 mcg was administered intravenously by the radiology nurse. Total intra-service moderate Sedation Time: 48 minutes. The patient's level of consciousness and vital signs were monitored continuously by radiology nursing throughout the procedure under my direct  supervision. CONTRAST:  120 mL of Omnipaque 300 milligram/mL FLUOROSCOPY: Radiation Exposure Index (as provided by the fluoroscopic device): 658 mGy Kerma COMPLICATIONS: None immediate. TECHNIQUE: Informed written consent was obtained from the patient after a thorough discussion of the procedural risks, benefits and alternatives. All questions were addressed. Maximal Sterile Barrier Technique was utilized including caps, mask, sterile gowns, sterile gloves, sterile drape, hand hygiene and skin antiseptic. A timeout was performed prior to the initiation of the procedure. The right groin was prepped and draped in the usual sterile fashion. The soft tissues in the right groin area were infiltrated with lidocaine 1%. Using a micropuncture kit and the modified Seldinger technique, access was gained to the right common femoral artery and a 5 French sheath was placed. Real-time ultrasound guidance was utilized for vascular access including the acquisition of a permanent ultrasound image documenting patency of the accessed vessel. Under fluoroscopy, a 5 [place] Berenstein 2 catheter was navigated over a 0.035" Terumo Glidewire into the aortic arch. The catheter was placed into the left common carotid artery. Frontal and lateral angiograms of the neck were obtained followed by frontal and lateral angiograms of the head. The catheter was subsequently placed in the left subclavian artery. Frontal and lateral angiograms of the neck were obtained with neutral and left lateral neck flexion. Then frontal and lateral angiograms of the head were obtained. The catheter was placed into the right common carotid artery. Frontal lateral angiograms of the neck were obtained followed by frontal and lateral angiograms of the head Then, the catheter was placed into the right subclavian artery. Frontal and lateral angiograms of the neck were obtained. Using biplane roadmap guidance, the catheter was placed into the right vertebral artery.  Frontal and lateral angiograms of the head were obtained. The catheter was again placed into the left subclavian artery. Magnified frontal and lateral angiograms of the neck were obtained, centered on the left vertebral artery stent. The catheter was subsequently withdrawn. A right common femoral artery angiogram was obtained in right anterior oblique view. A 5 [place] Exoseal was utilized for access closure. Immediate hemostasis was achieved. A FINDINGS: Left CCA, cervical angiograms: Status post stenting extending from  the distal left common carotid artery to the proximal left internal carotid artery. The stent is widely patent with minimal neointimal hyperplasia. No significant stenosis. Left CCA, cranial angiograms: There is brisk vascular contrast filling of the left ACA and MCA vascular trees. Luminal caliber is smooth and tapering. No aneurysms or abnormally high-flow, early draining veins are seen. No regions of abnormal hypervascularity are noted. The visualized dural sinuses are patent. The visualized branches of the left external carotid artery are unremarkable. Left subclavian angiograms: Status post stenting of the proximal left vertebral artery. The stent appear widely patent on the neutral head position. However, the distal aspect of the stent appear narrowed (approximately 60%) with left neck flexion. Atherosclerotic changes in the proximal left subclavian artery with calcified plaques resulting approximately 35% stenosis. Left vertebral artery angiograms: The left vertebral artery, basilar artery, and bilateral posterior cerebral arteries are unremarkable. Luminal caliber is smooth and tapering. No aneurysms or abnormally high-flow, early draining veins are seen. No regions of abnormal hypervascularity are noted. The visualized dural sinuses are patent. Right CCA, cervical angiograms: Status post stenting extending from the distal right common carotid artery to the proximal right internal carotid  artery. The stent is widely patent with no visible neointimal hyperplasia. No significant stenosis. Right CCA, cranial angiograms: There is brisk vascular contrast filling of the right ACA and MCA vascular trees. Luminal caliber is smooth and tapering. No aneurysms or abnormally high-flow, early draining veins are seen. No regions of abnormal hypervascularity are noted. The visualized dural sinuses are patent. The visualized branches of the right external carotid artery are unremarkable. Right subclavian angiograms: Normal caliber of the right subclavian artery. There is approximately 60% stenosis of the origin of the right vertebral artery. Right vertebral artery angiograms: The right vertebral artery, basilar artery, and bilateral posterior cerebral arteries are unremarkable. Luminal caliber is smooth and tapering. No aneurysms or abnormally high-flow, early draining veins are seen. No regions of abnormal hypervascularity are noted. The visualized dural sinuses are patent. Right common femoral artery angiograms: The access is at the level of the mid right common femoral artery. The femoral artery has normal caliber, adequate for closure device. PROCEDURE: No intervention performed. IMPRESSION: 1. Status post stenting of the proximal left vertebral artery noting mildly increased tortuosity of the distal stent which is worsened by left lateral neck flexion resulting in approximately 60% stenosis. 2. Status post bilateral cervical carotid stenting with widely patent implant bilaterally. 3. Approximately 60% stenosis of the origin of the right vertebral artery. PLAN: Follow-up of this visit to discuss angiogram findings and management. Electronically Signed   By: Baldemar Lenis M.D.   On: 11/28/2022 15:56   IR US Guide Vasc Access Right  Result Date: 11/28/2022 INDICATION: Mr. Chaikin 62 year old male with severe cerebrovascular atherosclerotic disease status post bilateral cervical carotid stenting  and left vertebral artery ostium stenting. Has new onset of intermittent vertical and dizziness which is related to head positioning. He returns today for a diagnostic cerebral angiogram to evaluate for implant stenosis. EXAM: ULTRASOUND-GUIDED VASCULAR ACCESS DIAGNOSTIC CEREBRAL ANGIOGRAM COMPARISON:  Cerebral angiograms May 24, 2022 and priors. MEDICATIONS: No antibiotics administered. ANESTHESIA/SEDATION: Moderate (conscious) sedation was employed during this procedure. A total of Versed 0.5 mg and Fentanyl 25 mcg was administered intravenously by the radiology nurse. Total intra-service moderate Sedation Time: 48 minutes. The patient's level of consciousness and vital signs were monitored continuously by radiology nursing throughout the procedure under my direct supervision. CONTRAST:  120 mL of  Omnipaque 300 milligram/mL FLUOROSCOPY: Radiation Exposure Index (as provided by the fluoroscopic device): 658 mGy Kerma COMPLICATIONS: None immediate. TECHNIQUE: Informed written consent was obtained from the patient after a thorough discussion of the procedural risks, benefits and alternatives. All questions were addressed. Maximal Sterile Barrier Technique was utilized including caps, mask, sterile gowns, sterile gloves, sterile drape, hand hygiene and skin antiseptic. A timeout was performed prior to the initiation of the procedure. The right groin was prepped and draped in the usual sterile fashion. The soft tissues in the right groin area were infiltrated with lidocaine 1%. Using a micropuncture kit and the modified Seldinger technique, access was gained to the right common femoral artery and a 5 French sheath was placed. Real-time ultrasound guidance was utilized for vascular access including the acquisition of a permanent ultrasound image documenting patency of the accessed vessel. Under fluoroscopy, a 5 [place] Berenstein 2 catheter was navigated over a 0.035" Terumo Glidewire into the aortic arch. The  catheter was placed into the left common carotid artery. Frontal and lateral angiograms of the neck were obtained followed by frontal and lateral angiograms of the head. The catheter was subsequently placed in the left subclavian artery. Frontal and lateral angiograms of the neck were obtained with neutral and left lateral neck flexion. Then frontal and lateral angiograms of the head were obtained. The catheter was placed into the right common carotid artery. Frontal lateral angiograms of the neck were obtained followed by frontal and lateral angiograms of the head Then, the catheter was placed into the right subclavian artery. Frontal and lateral angiograms of the neck were obtained. Using biplane roadmap guidance, the catheter was placed into the right vertebral artery. Frontal and lateral angiograms of the head were obtained. The catheter was again placed into the left subclavian artery. Magnified frontal and lateral angiograms of the neck were obtained, centered on the left vertebral artery stent. The catheter was subsequently withdrawn. A right common femoral artery angiogram was obtained in right anterior oblique view. A 5 [place] Exoseal was utilized for access closure. Immediate hemostasis was achieved. A FINDINGS: Left CCA, cervical angiograms: Status post stenting extending from the distal left common carotid artery to the proximal left internal carotid artery. The stent is widely patent with minimal neointimal hyperplasia. No significant stenosis. Left CCA, cranial angiograms: There is brisk vascular contrast filling of the left ACA and MCA vascular trees. Luminal caliber is smooth and tapering. No aneurysms or abnormally high-flow, early draining veins are seen. No regions of abnormal hypervascularity are noted. The visualized dural sinuses are patent. The visualized branches of the left external carotid artery are unremarkable. Left subclavian angiograms: Status post stenting of the proximal left  vertebral artery. The stent appear widely patent on the neutral head position. However, the distal aspect of the stent appear narrowed (approximately 60%) with left neck flexion. Atherosclerotic changes in the proximal left subclavian artery with calcified plaques resulting approximately 35% stenosis. Left vertebral artery angiograms: The left vertebral artery, basilar artery, and bilateral posterior cerebral arteries are unremarkable. Luminal caliber is smooth and tapering. No aneurysms or abnormally high-flow, early draining veins are seen. No regions of abnormal hypervascularity are noted. The visualized dural sinuses are patent. Right CCA, cervical angiograms: Status post stenting extending from the distal right common carotid artery to the proximal right internal carotid artery. The stent is widely patent with no visible neointimal hyperplasia. No significant stenosis. Right CCA, cranial angiograms: There is brisk vascular contrast filling of the right ACA and MCA  vascular trees. Luminal caliber is smooth and tapering. No aneurysms or abnormally high-flow, early draining veins are seen. No regions of abnormal hypervascularity are noted. The visualized dural sinuses are patent. The visualized branches of the right external carotid artery are unremarkable. Right subclavian angiograms: Normal caliber of the right subclavian artery. There is approximately 60% stenosis of the origin of the right vertebral artery. Right vertebral artery angiograms: The right vertebral artery, basilar artery, and bilateral posterior cerebral arteries are unremarkable. Luminal caliber is smooth and tapering. No aneurysms or abnormally high-flow, early draining veins are seen. No regions of abnormal hypervascularity are noted. The visualized dural sinuses are patent. Right common femoral artery angiograms: The access is at the level of the mid right common femoral artery. The femoral artery has normal caliber, adequate for closure  device. PROCEDURE: No intervention performed. IMPRESSION: 1. Status post stenting of the proximal left vertebral artery noting mildly increased tortuosity of the distal stent which is worsened by left lateral neck flexion resulting in approximately 60% stenosis. 2. Status post bilateral cervical carotid stenting with widely patent implant bilaterally. 3. Approximately 60% stenosis of the origin of the right vertebral artery. PLAN: Follow-up of this visit to discuss angiogram findings and management. Electronically Signed   By: Baldemar Lenis M.D.   On: 11/28/2022 15:56   IR ANGIO VERTEBRAL SEL VERTEBRAL BILAT MOD SED  Result Date: 11/28/2022 INDICATION: Mr. Calvin Smith 62 year old male with severe cerebrovascular atherosclerotic disease status post bilateral cervical carotid stenting and left vertebral artery ostium stenting. Has new onset of intermittent vertical and dizziness which is related to head positioning. He returns today for a diagnostic cerebral angiogram to evaluate for implant stenosis. EXAM: ULTRASOUND-GUIDED VASCULAR ACCESS DIAGNOSTIC CEREBRAL ANGIOGRAM COMPARISON:  Cerebral angiograms May 24, 2022 and priors. MEDICATIONS: No antibiotics administered. ANESTHESIA/SEDATION: Moderate (conscious) sedation was employed during this procedure. A total of Versed 0.5 mg and Fentanyl 25 mcg was administered intravenously by the radiology nurse. Total intra-service moderate Sedation Time: 48 minutes. The patient's level of consciousness and vital signs were monitored continuously by radiology nursing throughout the procedure under my direct supervision. CONTRAST:  120 mL of Omnipaque 300 milligram/mL FLUOROSCOPY: Radiation Exposure Index (as provided by the fluoroscopic device): 658 mGy Kerma COMPLICATIONS: None immediate. TECHNIQUE: Informed written consent was obtained from the patient after a thorough discussion of the procedural risks, benefits and alternatives. All questions were  addressed. Maximal Sterile Barrier Technique was utilized including caps, mask, sterile gowns, sterile gloves, sterile drape, hand hygiene and skin antiseptic. A timeout was performed prior to the initiation of the procedure. The right groin was prepped and draped in the usual sterile fashion. The soft tissues in the right groin area were infiltrated with lidocaine 1%. Using a micropuncture kit and the modified Seldinger technique, access was gained to the right common femoral artery and a 5 French sheath was placed. Real-time ultrasound guidance was utilized for vascular access including the acquisition of a permanent ultrasound image documenting patency of the accessed vessel. Under fluoroscopy, a 5 [place] Berenstein 2 catheter was navigated over a 0.035" Terumo Glidewire into the aortic arch. The catheter was placed into the left common carotid artery. Frontal and lateral angiograms of the neck were obtained followed by frontal and lateral angiograms of the head. The catheter was subsequently placed in the left subclavian artery. Frontal and lateral angiograms of the neck were obtained with neutral and left lateral neck flexion. Then frontal and lateral angiograms of the head were obtained. The  catheter was placed into the right common carotid artery. Frontal lateral angiograms of the neck were obtained followed by frontal and lateral angiograms of the head Then, the catheter was placed into the right subclavian artery. Frontal and lateral angiograms of the neck were obtained. Using biplane roadmap guidance, the catheter was placed into the right vertebral artery. Frontal and lateral angiograms of the head were obtained. The catheter was again placed into the left subclavian artery. Magnified frontal and lateral angiograms of the neck were obtained, centered on the left vertebral artery stent. The catheter was subsequently withdrawn. A right common femoral artery angiogram was obtained in right anterior oblique  view. A 5 [place] Exoseal was utilized for access closure. Immediate hemostasis was achieved. A FINDINGS: Left CCA, cervical angiograms: Status post stenting extending from the distal left common carotid artery to the proximal left internal carotid artery. The stent is widely patent with minimal neointimal hyperplasia. No significant stenosis. Left CCA, cranial angiograms: There is brisk vascular contrast filling of the left ACA and MCA vascular trees. Luminal caliber is smooth and tapering. No aneurysms or abnormally high-flow, early draining veins are seen. No regions of abnormal hypervascularity are noted. The visualized dural sinuses are patent. The visualized branches of the left external carotid artery are unremarkable. Left subclavian angiograms: Status post stenting of the proximal left vertebral artery. The stent appear widely patent on the neutral head position. However, the distal aspect of the stent appear narrowed (approximately 60%) with left neck flexion. Atherosclerotic changes in the proximal left subclavian artery with calcified plaques resulting approximately 35% stenosis. Left vertebral artery angiograms: The left vertebral artery, basilar artery, and bilateral posterior cerebral arteries are unremarkable. Luminal caliber is smooth and tapering. No aneurysms or abnormally high-flow, early draining veins are seen. No regions of abnormal hypervascularity are noted. The visualized dural sinuses are patent. Right CCA, cervical angiograms: Status post stenting extending from the distal right common carotid artery to the proximal right internal carotid artery. The stent is widely patent with no visible neointimal hyperplasia. No significant stenosis. Right CCA, cranial angiograms: There is brisk vascular contrast filling of the right ACA and MCA vascular trees. Luminal caliber is smooth and tapering. No aneurysms or abnormally high-flow, early draining veins are seen. No regions of abnormal  hypervascularity are noted. The visualized dural sinuses are patent. The visualized branches of the right external carotid artery are unremarkable. Right subclavian angiograms: Normal caliber of the right subclavian artery. There is approximately 60% stenosis of the origin of the right vertebral artery. Right vertebral artery angiograms: The right vertebral artery, basilar artery, and bilateral posterior cerebral arteries are unremarkable. Luminal caliber is smooth and tapering. No aneurysms or abnormally high-flow, early draining veins are seen. No regions of abnormal hypervascularity are noted. The visualized dural sinuses are patent. Right common femoral artery angiograms: The access is at the level of the mid right common femoral artery. The femoral artery has normal caliber, adequate for closure device. PROCEDURE: No intervention performed. IMPRESSION: 1. Status post stenting of the proximal left vertebral artery noting mildly increased tortuosity of the distal stent which is worsened by left lateral neck flexion resulting in approximately 60% stenosis. 2. Status post bilateral cervical carotid stenting with widely patent implant bilaterally. 3. Approximately 60% stenosis of the origin of the right vertebral artery. PLAN: Follow-up of this visit to discuss angiogram findings and management. Electronically Signed   By: Baldemar Lenis M.D.   On: 11/28/2022 15:56    Labs:  CBC: Recent Labs    04/17/22 0709 05/24/22 0703 11/13/22 1055 11/28/22 0646  WBC 7.4 6.6 7.6 7.1  HGB 14.9 14.7 15.4 15.4  HCT 41.9 42.1 44.0 43.0  PLT 222 227 246 234    COAGS: Recent Labs    03/21/22 1929 04/17/22 0709 05/24/22 0703 11/28/22 0646  INR 1.1 1.1 1.1 1.1  APTT 28  --   --   --     BMP: Recent Labs    03/23/22 0240 04/17/22 0709 05/24/22 0703 10/31/22 1114 11/13/22 1055 11/28/22 0646  NA 130* 132* 138 137 136 135  K 3.9 4.0 3.9 4.6 4.2 4.0  CL 100 99 105 98 100 100  CO2 22 23  23 21 21  21*  GLUCOSE 100* 129* 133* 72 69* 84  BUN 10 18 15 13 14 16   CALCIUM 8.5* 8.7* 9.4 9.8 9.3 9.1  CREATININE 0.99 1.14 1.16 1.25 1.20 1.19  GFRNONAA >60 >60 >60  --   --  >60    LIVER FUNCTION TESTS: Recent Labs    03/21/22 1929 03/22/22 2220 03/23/22 0240 11/13/22 1055  BILITOT 1.8* 1.8* 1.5* 0.8  AST 36 33 30 25  ALT 41 45* 40 29  ALKPHOS 69 60 57 73  PROT 7.4 6.6 6.3* 6.8  ALBUMIN 4.3 3.6 3.4* 4.3    Assessment:  I reviewed images obtained with Mr. Retherford and his wife. Both carotid and the left vertebral artery stents appear widely patent. However, there is some narrowing of the stent when the neck is flexed to the left side. This could be causing his symptoms since his right vertebral artery ostium is also stenotic. However, the vessel should be extended while looking up, not buckling the stent and, therefore, would not explain why he would develop symptoms in that position. I would like him to be evaluated by ENT to exclude any inner ear pathology. If this is excluded, we should proceed with right vertebral artery ostium angioplasty and stenting.  Signed: Baldemar Lenis, MD 11/30/2022, 2:19 PM    I spent a total of    15 Minutes in face to face in clinical consultation, greater than 50% of which was counseling/coordinating care for vertebral artery stenosis.

## 2022-12-19 ENCOUNTER — Telehealth (HOSPITAL_COMMUNITY): Payer: Self-pay

## 2022-12-19 ENCOUNTER — Other Ambulatory Visit (HOSPITAL_COMMUNITY): Payer: Self-pay | Admitting: Neuroradiology

## 2022-12-19 DIAGNOSIS — G450 Vertebro-basilar artery syndrome: Secondary | ICD-10-CM

## 2022-12-19 DIAGNOSIS — R42 Dizziness and giddiness: Secondary | ICD-10-CM

## 2022-12-19 NOTE — Telephone Encounter (Signed)
Called to schedule consult, no answer, no vm. AB  

## 2022-12-21 ENCOUNTER — Ambulatory Visit (HOSPITAL_COMMUNITY)
Admission: RE | Admit: 2022-12-21 | Discharge: 2022-12-21 | Disposition: A | Discharge status: Home / Self Care | Payer: 59 | Source: Ambulatory Visit | Attending: Neuroradiology | Admitting: Neuroradiology

## 2022-12-21 ENCOUNTER — Other Ambulatory Visit (HOSPITAL_COMMUNITY): Payer: Self-pay | Admitting: Neuroradiology

## 2022-12-21 DIAGNOSIS — R42 Dizziness and giddiness: Secondary | ICD-10-CM

## 2022-12-21 DIAGNOSIS — G450 Vertebro-basilar artery syndrome: Secondary | ICD-10-CM

## 2022-12-22 NOTE — Progress Notes (Signed)
Referring Physician(s): de Marcial Pacas Rodrigues,Romell Cavanah  Chief Complaint: Lightheadedness.  History of present illness:  62 year old male with past medical history significant for diabetes, hypertension, hyperlipidemia, migraines, sleep apnea who presented to the ED for evaluation after an episode of ataxic gait, vertigo and nausea. Symptoms resolved after 20 minutes. CT of the head without contrast showed no acute intracranial abnormality. CT angiogram of the head and neck showed multifocal severe stenosis in bilateral carotid bifurcation and origin of the vertebral arteries. He underwent uneventful stenting of the left vertebral artery ostium on March 24, 2022, left carotid angioplasty and stenting with cerebral protection device on April 17, 2022 and right carotid angioplasty and stenting with cerebral protection device on 05/24/2022. He complains of lightheadedness, dizziness and headache when moving head up and down and to the left side.   Interval history: Calvin Smith has been evaluated for Benign paroxysmal positional vertigo by Gigi Gin with ENT, but tests were negative. It was felt that dizziness is likely of vascular cause. Calvin Smith also referred 2 episodes of intense dizziness today which lasted approximately 15 minutes. He was gazing down before the symptoms started. He is concerned about increased frequency of episodes.    Past Medical History:  Diagnosis Date   Allergic rhinitis    Arm fracture, left as a child   DeQuervain's disease (tenosynovitis)    right wrist   Diabetes mellitus    type 2   Erectile dysfunction    GERD (gastroesophageal reflux disease)    Hyperlipidemia    Hypertension    Migraines    Peripheral vascular disease (HCC)    Sleep apnea    uses nightly   Stroke (HCC) 03/21/2022    Past Surgical History:  Procedure Laterality Date   Cardiolite  5/06   Negative EF 66%   CARPAL TUNNEL RELEASE  9/03   left    IR ANGIO INTRA EXTRACRAN  SEL COM CAROTID INNOMINATE BILAT MOD SED  03/23/2022   IR ANGIO INTRA EXTRACRAN SEL COM CAROTID INNOMINATE BILAT MOD SED  05/24/2022   IR ANGIO INTRA EXTRACRAN SEL COM CAROTID INNOMINATE BILAT MOD SED  11/28/2022   IR ANGIO INTRA EXTRACRAN SEL COM CAROTID INNOMINATE UNI L MOD SED  04/17/2022   IR ANGIO VERTEBRAL SEL SUBCLAVIAN INNOMINATE BILAT MOD SED  03/23/2022   IR ANGIO VERTEBRAL SEL SUBCLAVIAN INNOMINATE UNI L MOD SED  04/17/2022   IR ANGIO VERTEBRAL SEL SUBCLAVIAN INNOMINATE UNI L MOD SED  05/24/2022   IR ANGIO VERTEBRAL SEL VERTEBRAL BILAT MOD SED  11/28/2022   IR ANGIO VERTEBRAL SEL VERTEBRAL UNI L MOD SED  03/29/2022   IR ANGIOGRAM EXTREMITY LEFT  03/29/2022   IR ANGIOGRAM FOLLOW UP STUDY  05/24/2022   IR INTRAVSC STENT CERV CAROTID W/EMB-PROT MOD SED INCL ANGIO  04/17/2022   IR INTRAVSC STENT CERV CAROTID W/EMB-PROT MOD SED INCL ANGIO  05/24/2022   IR TRANSCATH EXCRAN VERT OR CAR A STENT  03/24/2022   IR US GUIDE VASC ACCESS RIGHT  03/23/2022   IR US GUIDE VASC ACCESS RIGHT  03/24/2022   IR US GUIDE VASC ACCESS RIGHT  04/17/2022   IR US GUIDE VASC ACCESS RIGHT  05/24/2022   IR US GUIDE VASC ACCESS RIGHT  11/28/2022   RADIOLOGY WITH ANESTHESIA Left 03/24/2022   Procedure: IR WITH ANESTHESIA;  Surgeon: Baldemar Lenis, MD;  Location: [facility] OR;  Service: Radiology;  Laterality: Left;   RADIOLOGY WITH ANESTHESIA N/A 04/17/2022   Procedure: LEFT CAROTID STENT;  Surgeon: Baldemar Lenis, MD;  Location: [facility] OR;  Service: Radiology;  Laterality: N/A;   RADIOLOGY WITH ANESTHESIA N/A 05/24/2022   Procedure: Right carotid angioplasty with possible stenting;  Surgeon: de Glori Luis, MD;  Location: [facility] OR;  Service: Radiology;  Laterality: N/A;   WRIST SURGERY  1/04   DeQuervain release   WRIST SURGERY  1/08   ORIF l wrist DVR plate screwhead autologous    Allergies: Bisoprolol-hydrochlorothiazide and Codeine  Medications: Prior to Admission medications    Medication Sig Start Date End Date Taking? Authorizing Provider  aspirin EC 81 MG tablet Take 81 mg by mouth daily.    [provider]  atorvastatin (LIPITOR) 80 MG tablet TAKE 1 TABLET BY MOUTH DAILY 07/10/22   Karie Schwalbe, MD  CONTOUR NEXT TEST test strip USE TO CHECK BLOOD SUGAR DAILY 11/20/22   Karie Schwalbe, MD  glipiZIDE (GLUCOTROL XL) 5 MG 24 hr tablet Take 5 mg by mouth in the morning and at bedtime.    [provider]  ibuprofen (ADVIL) 200 MG tablet Take 400-600 mg by mouth every 8 (eight) hours as needed for headache.    [provider]  insulin glargine (LANTUS SOLOSTAR) 100 UNIT/ML Solostar Pen Inject 42 Units into the skin 2 (two) times daily.    [provider]  Insulin Pen Needle (BD PEN NEEDLE NANO U/F) 32G X 4 MM MISC USE ONCE DAILY 09/01/22   Tillman Abide I, MD  JARDIANCE 25 MG TABS tablet TAKE 1 TABLET BY MOUTH DAILY 08/15/22   Tillman Abide I, MD  lisinopril (ZESTRIL) 20 MG tablet TAKE 1 TABLET BY MOUTH DAILY 08/15/22   Tillman Abide I, MD  metFORMIN (GLUCOPHAGE) 1000 MG tablet TAKE 1 TABLET BY MOUTH TWICE  DAILY WITH MEALS 09/01/22   Karie Schwalbe, MD  pantoprazole (PROTONIX) 40 MG tablet Take 1 tablet (40 mg total) by mouth 2 (two) times daily. 08/16/22   Karie Schwalbe, MD  Semaglutide, 2 MG/DOSE, (OZEMPIC, 2 MG/DOSE,) 8 MG/3ML SOPN Inject 2 mg into the skin every Saturday.    [provider]  sildenafil (VIAGRA) 100 MG tablet 1/2 to 1 tab by mouth daily as needed. 10/18/22   Karie Schwalbe, MD  tamsulosin (FLOMAX) 0.4 MG CAPS capsule Take 1 capsule (0.4 mg total) by mouth daily. 11/13/22   Karie Schwalbe, MD  Ubrogepant (UBRELVY) 100 MG TABS Take 100 mg by mouth as needed (Take 1 tablet at onset of headache, may repeat in 2 hours if needed, max is 200 mg in 24 hours). 04/26/22   Glean Salvo, NP     Family History  Problem Relation Age of Onset   Hypertension Father    Lymphoma Father        non-hodgkins    Uterine cancer Maternal Grandmother    Hypertension Paternal Grandfather    Lung cancer Paternal Aunt    Coronary artery disease Paternal Uncle    Colon cancer Unknown        in 1 relative (?aunt)    Social History   Socioeconomic History   Marital status: Married    Spouse name: Not on file   Number of children: Not on file   Years of education: Not on file   Highest education level: Not on file  Occupational History   Occupation: SUPERVISOR    Employer: [employer]    Comment: immunoassay dept  Tobacco Use   Smoking status: Never  Passive exposure: Past   Smokeless tobacco: Never  Vaping Use   Vaping Use: Never used  Substance and Sexual Activity   Alcohol use: Not Currently    Comment: rare   Drug use: No   Sexual activity: Yes    Partners: Female  Other Topics Concern   Not on file  Social History Narrative   Regular exercise: light to moderate   Caffeine use: coffee daily   Social Determinants of Health   Financial Resource Strain: Not on file  Food Insecurity: Not on file  Transportation Needs: Not on file  Physical Activity: Not on file  Stress: Not on file  Social Connections: Not on file     Vital Signs: There were no vitals taken for this visit.  Physical Exam Neurological:     Mental Status: He is alert and oriented to person, place, and time. Mental status is at baseline.     Imaging: No results found.  Labs:  CBC: Recent Labs    04/17/22 0709 05/24/22 0703 11/13/22 1055 11/28/22 0646  WBC 7.4 6.6 7.6 7.1  HGB 14.9 14.7 15.4 15.4  HCT 41.9 42.1 44.0 43.0  PLT 222 227 246 234    COAGS: Recent Labs    03/21/22 1929 04/17/22 0709 05/24/22 0703 11/28/22 0646  INR 1.1 1.1 1.1 1.1  APTT 28  --   --   --     BMP: Recent Labs    03/23/22 0240 04/17/22 0709 05/24/22 0703 10/31/22 1114 11/13/22 1055 11/28/22 0646  NA 130* 132* 138 137 136 135  K 3.9 4.0 3.9 4.6 4.2 4.0  CL 100 99 105 98 100 100  CO2 22 23 23 21 21   21*  GLUCOSE 100* 129* 133* 72 69* 84  BUN 10 18 15 13 14 16   CALCIUM 8.5* 8.7* 9.4 9.8 9.3 9.1  CREATININE 0.99 1.14 1.16 1.25 1.20 1.19  GFRNONAA >60 >60 >60  --   --  >60    LIVER FUNCTION TESTS: Recent Labs    03/21/22 1929 03/22/22 2220 03/23/22 0240 11/13/22 1055  BILITOT 1.8* 1.8* 1.5* 0.8  AST 36 33 30 25  ALT 41 45* 40 29  ALKPHOS 69 60 57 73  PROT 7.4 6.6 6.3* 6.8  ALBUMIN 4.3 3.6 3.4* 4.3    Assessment:  Calvin Smith continues to experience episodes of intense dizziness/lightheadedness and has had a negative evaluation by ENT for any inner ear pathology. Therefore, this is most likely of vascular origin. Possibility of stenting his stenotic right vertebral artery and/or performing in stent angioplasty of his left vertebral artery stent were discussed. He would like to proceed with intervention. Risks and benefits were discussed. In anticipation to the procedure, he will start taking plavix 75 mg q.d. and continue taking ASA 81 mg q.d. We will schedule his procedure under general anesthesia.  Signed: Baldemar Lenis, MD 12/22/2022, 2:27 PM    I spent a total of    25 Minutes in face to face in clinical consultation, greater than 50% of which was counseling/coordinating care for vertebral artery stenosis.

## 2023-01-03 ENCOUNTER — Telehealth: Payer: Self-pay | Admitting: Internal Medicine

## 2023-01-03 NOTE — Telephone Encounter (Signed)
Patient dropped off document FMLA, to be filled out by provider. Patient requested to send it via Fax within 5-days. Document is located in providers tray at front office.Please advise at Mobile [phone] (mobile)

## 2023-01-03 NOTE — Telephone Encounter (Signed)
Left message on VM for pt to get more information about the time frame he is going to be out and what he is out for. I could not find anything in the chart past December 2023.

## 2023-01-04 NOTE — Telephone Encounter (Signed)
Pt called back returning Shannon's call. Told pt Shannon's response. Pt asked if we could fax the ppw over to Dr. Quay Burow? Told pt we would need a fax number or he'd need to pick ppw up. Pt stated he would try to find the fax number if not he'll pick up ppw this afternoon. Call back # [phone]

## 2023-01-04 NOTE — Telephone Encounter (Signed)
Thank you for the help

## 2023-01-04 NOTE — Telephone Encounter (Signed)
Spoke to pt yesterday afternoon and he gave me the procedure he is having done. Gave forms to Dr Alphonsus Sias. Per Dr Alphonsus Sias, the interventional radiologist doing the procedure should be the one filling out the forms. Dr Alphonsus Sias would need to see him. Problem with that is we are closed to patients tomorrow and Dr Alphonsus Sias is out of the office next week.   I left a message to have pt call back.

## 2023-01-07 ENCOUNTER — Other Ambulatory Visit: Payer: Self-pay | Admitting: Internal Medicine

## 2023-01-09 ENCOUNTER — Encounter (HOSPITAL_COMMUNITY): Payer: Self-pay | Admitting: Neuroradiology

## 2023-01-09 ENCOUNTER — Other Ambulatory Visit: Payer: Self-pay | Admitting: Radiology

## 2023-01-09 ENCOUNTER — Other Ambulatory Visit: Payer: Self-pay

## 2023-01-09 DIAGNOSIS — I771 Stricture of artery: Secondary | ICD-10-CM

## 2023-01-09 NOTE — Progress Notes (Signed)
Anesthesia Chart Review: Same day workup  62 yo male with pertinent hx including IDDM2 (A1c 6.4 on 11/13/22), GERD, TIA, PVD, HTN, Migraines, OSA on CPAP.  Follows with interventional radiology for hx of TIA in August of 2023. CT of the head without contrast showed no acute intracranial abnormality. CT angiogram of the head and neck showed multifocal severe stenosis in bilateral carotid bifurcation and origin of the vertebral arteries. He underwent uneventful stenting of the left vertebral artery ostium on March 24, 2022, left carotid angioplasty and stenting with cerebral protection device on April 17, 2022 and right carotid angioplasty and stenting with cerebral protection device on 05/24/2022. He continues to complain of dizziness/lightheadedness and  has had a negative ENT eval for any inner ear pathology. Etiology felt likely to be stenotic right vertebral artery. Last seen by Dr. Tommie Sams 12/22/22 and intervention recommended at that time. He was started on ASA and Plavix to continue through procedure.   Pt is on once weekly GLP-1 agonist Ozempic, reported last dose 01/06/23. He also has history of refractory GERD. His pantoprazole was recently increased to 40mg  BID.  Reviewed with anesthesiologists Dr. Jean Rosenthal and Dr. Armond Hang. Advised okay to proceed as planned. Consider RSI due to GERD and current GLP-1 use. Will need evaluation by assigned anesthesiologist on DOS.   Pt will need DOS labs and evaluation.   EKG 03/23/22: NSR. Rate 82.  Carotid duplex 09/14/22: Summary:  Right Carotid: Patent right ICA stent with <50% stenosis.  Left Carotid: Patent left ICA stent with <50% ICA stenosis. Vertebral artery stent appears patent.     Zannie Cove [facility] Short Stay Center/Anesthesiology Phone [phone] 01/09/2023 2:48 PM

## 2023-01-09 NOTE — Anesthesia Preprocedure Evaluation (Signed)
Anesthesia Evaluation  Patient identified by MRN, date of birth, ID band Patient awake    Reviewed: Allergy & Precautions, H&P , NPO status , Patient's Chart, lab work & pertinent test results  Airway Mallampati: II   Neck ROM: full    Dental   Pulmonary sleep apnea    breath sounds clear to auscultation       Cardiovascular hypertension, + Peripheral Vascular Disease   Rhythm:regular Rate:Normal     Neuro/Psych  Headaches CVA    GI/Hepatic ,GERD  ,,  Endo/Other  diabetes, Type 2    Renal/GU      Musculoskeletal   Abdominal   Peds  Hematology   Anesthesia Other Findings   Reproductive/Obstetrics                             Anesthesia Physical Anesthesia Plan  ASA: 3  Anesthesia Plan: General   Post-op Pain Management:    Induction: Intravenous  PONV Risk Score and Plan: 2 and Ondansetron and Dexamethasone  Airway Management Planned: Oral ETT  Additional Equipment: Arterial line  Intra-op Plan:   Post-operative Plan: Extubation in OR  Informed Consent: I have reviewed the patients History and Physical, chart, labs and discussed the procedure including the risks, benefits and alternatives for the proposed anesthesia with the patient or authorized representative who has indicated his/her understanding and acceptance.     Dental advisory given  Plan Discussed with: CRNA, Anesthesiologist and Surgeon  Anesthesia Plan Comments: (See PAT note by Antionette Poles, PA-C  )        Anesthesia Quick Evaluation

## 2023-01-09 NOTE — Progress Notes (Addendum)
SDW CALL  Patient was given pre-op instructions over the phone. The opportunity was given for the patient to ask questions. No further questions asked. Patient verbalized understanding of instructions given.   PCP - Tillman Abide, MD Cardiologist - none  PPM/ICD - denies Device Orders -  Rep Notified -   Chest x-ray - 03/22/22 EKG - 03/21/22 Stress Test - na ECHO - none Cardiac Cath - denies  Sleep Study -yes - 08/29/17 CPAP - does not use  Fasting Blood Sugar - 90's Checks Blood Sugar every morning  Blood Thinner Instructions:continue Plavix. Plavix is not on pt's med list but pt stated that he has been taking it since his last visit with Dr. Melchor Amour on 12/21/22. Pt was in the car and could not tell me the dose of the medication so I instructed him to tell the pre op nurse the dose when he comes for surgery tomorrow.  Aspirin Instructions: continue ASA 81  ERAS Protcol -no PRE-SURGERY Ensure or G2-   COVID TEST- na   Anesthesia review:  yes- pt on Jardiance which he took this am- 6/4 and Ozempic which he took 6/1. Secure chat sent to Shonna Chock and Antionette Poles.  Patient denies shortness of breath, fever, cough and chest pain over the phone call    Surgical Instructions    Your procedure is scheduled on 01/10/23  Report to [facility] Main Entrance "A" at 6:00 A.M., then check in with the Admitting office.  Call this number if you have problems the morning of surgery:  [phone]    Remember:  Do not eat or drink after midnight the night before your surgery   Take these medicines the morning of surgery with A SIP OF WATER: Aspirin,Plavix,Tamsulosin,Protonix  As of today, STOP taking any Aspirin (unless otherwise instructed by your surgeon) Aleve, Naproxen, Ibuprofen, Motrin, Advil, Goody's, BC's, all herbal medications, fish oil, and all vitamins. WHAT DO I DO ABOUT MY DIABETES MEDICATION?   Do not take oral diabetes medicines (pills) the morning of  surgery. Do no take Glipizide,Metformin, or Jardiance.     THE MORNING OF SURGERY, take 21 units of glargine(lantus) insulin.  HOW TO MANAGE YOUR DIABETES BEFORE AND AFTER SURGERY  Why is it important to control my blood sugar before and after surgery? Improving blood sugar levels before and after surgery helps healing and can limit problems. A way of improving blood sugar control is eating a healthy diet by:  Eating less sugar and carbohydrates  Increasing activity/exercise  Talking with your doctor about reaching your blood sugar goals High blood sugars (greater than 180 mg/dL) can raise your risk of infections and slow your recovery, so you will need to focus on controlling your diabetes during the weeks before surgery. Make sure that the doctor who takes care of your diabetes knows about your planned surgery including the date and location.  Check your blood sugar the morning of your surgery when you wake up and every 2 hours until you get to the Short Stay unit.  If your blood sugar is less than 70 mg/dL, you will need to treat for low blood sugar: Do not take insulin. Treat a low blood sugar (less than 70 mg/dL) with  cup of clear juice (cranberry or apple), 4 glucose tablets, OR glucose gel. Recheck blood sugar in 15 minutes after treatment (to make sure it is greater than 70 mg/dL). If your blood sugar is not greater than 70 mg/dL on recheck, call [phone] for  further instructions. Report your blood sugar to the short stay nurse when you get to Short Stay.  If you are admitted to the hospital after surgery: Your blood sugar will be checked by the staff and you will probably be given insulin after surgery (instead of oral diabetes medicines) to make sure you have good blood sugar levels. The goal for blood sugar control after surgery is 80-180 mg/dL.   [facility] is not responsible for any belongings or valuables. .   Do NOT Smoke (Tobacco/Vaping)  24 hours prior to your  procedure  If you use a CPAP at night, you may bring your mask for your overnight stay.   Contacts, glasses, hearing aids, dentures or partials may not be worn into surgery, please bring cases for these belongings   Patients discharged the day of surgery will not be allowed to drive home, and someone needs to stay with them for 24 hours.   SURGICAL WAITING ROOM VISITATION You may have 1 visitor in the pre-op area at a time determined by the pre-op nurse. (Visitor may not switch out) Patients having surgery or a procedure in a hospital may have two support people in the waiting room. Children under the age of 32 must have an adult with them who is not the patient. They may stay in the waiting area during the procedure and may switch out with other visitors. If the patient needs to stay at the hospital during part of their recovery, the visitor guidelines for inpatient rooms apply.  Please refer to the [facility] website for the visitor guidelines for Inpatients (after your surgery is over and you are in a regular room).     Special instructions:    Oral Hygiene is also important to reduce your risk of infection.  Remember - BRUSH YOUR TEETH THE MORNING OF SURGERY WITH YOUR REGULAR TOOTHPASTE   Day of Surgery:  Take a shower the day of or night before with antibacterial soap. Wear Clean/Comfortable clothing the morning of surgery Do not apply any deodorants/lotions.   Do not wear jewelry or makeup Do not wear lotions, powders,  perfumes/colognes, or deodorant. Do not shave 48 hours prior to surgery.  Men may shave face and neck. Do not bring valuables to the hospital. Do not wear nail polish, gel polish, artificial nails, or any other type of covering on natural nails (fingers and toes) If you have artificial nails or gel coating that need to be removed by a nail salon, please have this removed prior to surgery. Artificial nails or gel coating may interfere with anesthesia's ability  to adequately monitor your vital signs. Remember to brush your teeth WITH YOUR REGULAR TOOTHPASTE.

## 2023-01-10 ENCOUNTER — Inpatient Hospital Stay (HOSPITAL_COMMUNITY): Payer: 59 | Admitting: Physician Assistant

## 2023-01-10 ENCOUNTER — Encounter (HOSPITAL_COMMUNITY): Payer: Self-pay

## 2023-01-10 ENCOUNTER — Inpatient Hospital Stay (HOSPITAL_COMMUNITY)
Admission: RE | Admit: 2023-01-10 | Discharge: 2023-01-11 | DRG: 039 | Disposition: A | Discharge status: Home / Self Care | Payer: 59 | Attending: Neuroradiology | Admitting: Neuroradiology

## 2023-01-10 ENCOUNTER — Encounter (HOSPITAL_COMMUNITY)
Admission: RE | Disposition: A | Discharge status: Home / Self Care | Payer: Self-pay | Source: Home / Self Care | Attending: Neuroradiology

## 2023-01-10 ENCOUNTER — Inpatient Hospital Stay (HOSPITAL_COMMUNITY)
Admission: RE | Admit: 2023-01-10 | Discharge: 2023-01-10 | Disposition: A | Discharge status: Home / Self Care | Payer: 59 | Source: Ambulatory Visit | Attending: Neuroradiology | Admitting: Neuroradiology

## 2023-01-10 DIAGNOSIS — I10 Essential (primary) hypertension: Secondary | ICD-10-CM | POA: Diagnosis present

## 2023-01-10 DIAGNOSIS — Z801 Family history of malignant neoplasm of trachea, bronchus and lung: Secondary | ICD-10-CM

## 2023-01-10 DIAGNOSIS — Z7985 Long-term (current) use of injectable non-insulin antidiabetic drugs: Secondary | ICD-10-CM | POA: Diagnosis not present

## 2023-01-10 DIAGNOSIS — Z807 Family history of other malignant neoplasms of lymphoid, hematopoietic and related tissues: Secondary | ICD-10-CM

## 2023-01-10 DIAGNOSIS — Z8049 Family history of malignant neoplasm of other genital organs: Secondary | ICD-10-CM

## 2023-01-10 DIAGNOSIS — Z8249 Family history of ischemic heart disease and other diseases of the circulatory system: Secondary | ICD-10-CM | POA: Diagnosis not present

## 2023-01-10 DIAGNOSIS — Z8673 Personal history of transient ischemic attack (TIA), and cerebral infarction without residual deficits: Secondary | ICD-10-CM | POA: Diagnosis not present

## 2023-01-10 DIAGNOSIS — R42 Dizziness and giddiness: Secondary | ICD-10-CM

## 2023-01-10 DIAGNOSIS — Z7982 Long term (current) use of aspirin: Secondary | ICD-10-CM | POA: Diagnosis not present

## 2023-01-10 DIAGNOSIS — G450 Vertebro-basilar artery syndrome: Principal | ICD-10-CM

## 2023-01-10 DIAGNOSIS — I771 Stricture of artery: Secondary | ICD-10-CM

## 2023-01-10 DIAGNOSIS — Z7984 Long term (current) use of oral hypoglycemic drugs: Secondary | ICD-10-CM

## 2023-01-10 DIAGNOSIS — E785 Hyperlipidemia, unspecified: Secondary | ICD-10-CM | POA: Diagnosis present

## 2023-01-10 DIAGNOSIS — G4730 Sleep apnea, unspecified: Secondary | ICD-10-CM | POA: Diagnosis present

## 2023-01-10 DIAGNOSIS — Z7902 Long term (current) use of antithrombotics/antiplatelets: Secondary | ICD-10-CM

## 2023-01-10 DIAGNOSIS — Z79899 Other long term (current) drug therapy: Secondary | ICD-10-CM | POA: Diagnosis not present

## 2023-01-10 DIAGNOSIS — E1151 Type 2 diabetes mellitus with diabetic peripheral angiopathy without gangrene: Secondary | ICD-10-CM

## 2023-01-10 DIAGNOSIS — I6501 Occlusion and stenosis of right vertebral artery: Secondary | ICD-10-CM | POA: Diagnosis present

## 2023-01-10 DIAGNOSIS — K219 Gastro-esophageal reflux disease without esophagitis: Secondary | ICD-10-CM | POA: Diagnosis present

## 2023-01-10 DIAGNOSIS — Z794 Long term (current) use of insulin: Secondary | ICD-10-CM

## 2023-01-10 HISTORY — PX: IR ANGIO VERTEBRAL SEL VERTEBRAL UNI R MOD SED: IMG5368

## 2023-01-10 HISTORY — PX: RADIOLOGY WITH ANESTHESIA: SHX6223

## 2023-01-10 HISTORY — PX: IR INTRA CRAN STENT: IMG2345

## 2023-01-10 HISTORY — PX: IR ANGIO VERTEBRAL SEL SUBCLAVIAN INNOMINATE UNI L MOD SED: IMG5364

## 2023-01-10 HISTORY — PX: IR US GUIDE VASC ACCESS RIGHT: IMG2390

## 2023-01-10 LAB — BASIC METABOLIC PANEL
Anion gap: 11 (ref 5–15)
BUN: 15 mg/dL (ref 8–23)
CO2: 25 mmol/L (ref 22–32)
Calcium: 9.5 mg/dL (ref 8.9–10.3)
Chloride: 97 mmol/L — ABNORMAL LOW (ref 98–111)
Creatinine, Ser: 1.36 mg/dL — ABNORMAL HIGH (ref 0.61–1.24)
GFR, Estimated: 59 mL/min — ABNORMAL LOW (ref >60)
Glucose, Bld: 100 mg/dL — ABNORMAL HIGH (ref 70–99)
Potassium: 4.5 mmol/L (ref 3.5–5.1)
Sodium: 133 mmol/L — ABNORMAL LOW (ref 135–145)

## 2023-01-10 LAB — CBC WITH DIFFERENTIAL/PLATELET
Abs Immature Granulocytes: 0.02 10*3/uL (ref 0.00–0.07)
Basophils Absolute: 0 10*3/uL (ref 0.0–0.1)
Basophils Relative: 0 %
Eosinophils Absolute: 0.1 10*3/uL (ref 0.0–0.5)
Eosinophils Relative: 2 %
HCT: 48 % (ref 39.0–52.0)
Hemoglobin: 16.4 g/dL (ref 13.0–17.0)
Immature Granulocytes: 0 %
Lymphocytes Relative: 21 %
Lymphs Abs: 1.7 10*3/uL (ref 0.7–4.0)
MCH: 31.5 pg (ref 26.0–34.0)
MCHC: 34.2 g/dL (ref 30.0–36.0)
MCV: 92.1 fL (ref 80.0–100.0)
Monocytes Absolute: 0.7 10*3/uL (ref 0.1–1.0)
Monocytes Relative: 9 %
Neutro Abs: 5.2 10*3/uL (ref 1.7–7.7)
Neutrophils Relative %: 68 %
Platelets: 249 10*3/uL (ref 150–400)
RBC: 5.21 MIL/uL (ref 4.22–5.81)
RDW: 12.3 % (ref 11.5–15.5)
WBC: 7.7 10*3/uL (ref 4.0–10.5)
nRBC: 0 % (ref 0.0–0.2)

## 2023-01-10 LAB — POCT ACTIVATED CLOTTING TIME
Activated Clotting Time: 134 seconds
Activated Clotting Time: 238 seconds
Activated Clotting Time: 476 seconds

## 2023-01-10 LAB — GLUCOSE, CAPILLARY
Glucose-Capillary: 130 mg/dL — ABNORMAL HIGH (ref 70–99)
Glucose-Capillary: 160 mg/dL — ABNORMAL HIGH (ref 70–99)
Glucose-Capillary: 172 mg/dL — ABNORMAL HIGH (ref 70–99)
Glucose-Capillary: 83 mg/dL (ref 70–99)
Glucose-Capillary: 98 mg/dL (ref 70–99)

## 2023-01-10 LAB — PROTIME-INR
INR: 1.1 (ref 0.8–1.2)
Prothrombin Time: 13.9 seconds (ref 11.4–15.2)

## 2023-01-10 LAB — MRSA NEXT GEN BY PCR, NASAL: MRSA by PCR Next Gen: NOT DETECTED

## 2023-01-10 SURGERY — IR WITH ANESTHESIA
Anesthesia: General

## 2023-01-10 MED ORDER — SUGAMMADEX SODIUM 200 MG/2ML IV SOLN
INTRAVENOUS | Status: DC | PRN
  Administered 2023-01-10: 200 mg via INTRAVENOUS

## 2023-01-10 MED ORDER — CHLORHEXIDINE GLUCONATE CLOTH 2 % EX PADS
6.0000 | MEDICATED_PAD | Freq: Every day | CUTANEOUS | Status: DC
  Administered 2023-01-10 – 2023-01-11 (×2): 6 via TOPICAL

## 2023-01-10 MED ORDER — ASPIRIN 325 MG PO TBEC
325.0000 mg | DELAYED_RELEASE_TABLET | ORAL | Status: DC
  Filled 2023-01-10: qty 1

## 2023-01-10 MED ORDER — INSULIN ASPART 100 UNIT/ML IJ SOLN: 0.0000 [IU] | INTRAMUSCULAR | Status: DC | PRN

## 2023-01-10 MED ORDER — INSULIN ASPART 100 UNIT/ML IJ SOLN
0.0000 [IU] | INTRAMUSCULAR | Status: DC
  Administered 2023-01-10: 4 [IU] via SUBCUTANEOUS
  Administered 2023-01-10 – 2023-01-11 (×3): 2 [IU] via SUBCUTANEOUS

## 2023-01-10 MED ORDER — SODIUM CHLORIDE 0.9 % IV BOLUS: 250.0000 mL | INTRAVENOUS | Status: AC | PRN

## 2023-01-10 MED ORDER — ORAL CARE MOUTH RINSE: 15.0000 mL | OROMUCOSAL | Status: DC | PRN

## 2023-01-10 MED ORDER — ONDANSETRON HCL 4 MG/2ML IJ SOLN: 4.0000 mg | Freq: Four times a day (QID) | INTRAMUSCULAR | Status: DC | PRN

## 2023-01-10 MED ORDER — ACETAMINOPHEN 160 MG/5ML PO SOLN: 650.0000 mg | ORAL | Status: DC | PRN

## 2023-01-10 MED ORDER — OXYCODONE HCL 5 MG PO TABS: 5.0000 mg | ORAL_TABLET | Freq: Once | ORAL | Status: DC | PRN

## 2023-01-10 MED ORDER — ROCURONIUM BROMIDE 10 MG/ML (PF) SYRINGE
PREFILLED_SYRINGE | INTRAVENOUS | Status: DC | PRN
  Administered 2023-01-10: 20 mg via INTRAVENOUS
  Administered 2023-01-10: 50 mg via INTRAVENOUS
  Administered 2023-01-10: 10 mg via INTRAVENOUS

## 2023-01-10 MED ORDER — ASPIRIN 81 MG PO CHEW: 324.0000 mg | CHEWABLE_TABLET | Freq: Every day | ORAL | Status: DC

## 2023-01-10 MED ORDER — CEFAZOLIN SODIUM-DEXTROSE 2-4 GM/100ML-% IV SOLN
2.0000 g | INTRAVENOUS | Status: AC
  Administered 2023-01-10: 2 g via INTRAVENOUS
  Filled 2023-01-10 (×2): qty 100

## 2023-01-10 MED ORDER — LACTATED RINGERS IV SOLN: INTRAVENOUS | Status: DC

## 2023-01-10 MED ORDER — SODIUM CHLORIDE 0.9 % IV SOLN: INTRAVENOUS | Status: DC | PRN

## 2023-01-10 MED ORDER — CHLORHEXIDINE GLUCONATE 0.12 % MT SOLN
15.0000 mL | Freq: Once | OROMUCOSAL | Status: AC
  Administered 2023-01-10: 15 mL via OROMUCOSAL
  Filled 2023-01-10: qty 15

## 2023-01-10 MED ORDER — SODIUM CHLORIDE 0.9 % IV SOLN: INTRAVENOUS | Status: DC

## 2023-01-10 MED ORDER — PHENYLEPHRINE 80 MCG/ML (10ML) SYRINGE FOR IV PUSH (FOR BLOOD PRESSURE SUPPORT)
PREFILLED_SYRINGE | INTRAVENOUS | Status: DC | PRN
  Administered 2023-01-10 (×3): 160 ug via INTRAVENOUS

## 2023-01-10 MED ORDER — ACETAMINOPHEN 650 MG RE SUPP: 650.0000 mg | RECTAL | Status: DC | PRN

## 2023-01-10 MED ORDER — ASPIRIN 81 MG PO CHEW
CHEWABLE_TABLET | ORAL | Status: AC
  Administered 2023-01-10: 243 mg via ORAL
  Filled 2023-01-10: qty 3

## 2023-01-10 MED ORDER — HEPARIN SODIUM (PORCINE) 1000 UNIT/ML IJ SOLN
INTRAMUSCULAR | Status: DC | PRN
  Administered 2023-01-10: 5000 [IU] via INTRAVENOUS

## 2023-01-10 MED ORDER — OXYCODONE HCL 5 MG/5ML PO SOLN: 5.0000 mg | Freq: Once | ORAL | Status: DC | PRN

## 2023-01-10 MED ORDER — ASPIRIN 325 MG PO TABS
325.0000 mg | ORAL_TABLET | Freq: Every day | ORAL | Status: DC
  Administered 2023-01-11: 325 mg via ORAL
  Filled 2023-01-10: qty 1

## 2023-01-10 MED ORDER — PROPOFOL 10 MG/ML IV BOLUS
INTRAVENOUS | Status: DC | PRN
  Administered 2023-01-10: 160 mg via INTRAVENOUS

## 2023-01-10 MED ORDER — PHENYLEPHRINE HCL-NACL 20-0.9 MG/250ML-% IV SOLN
INTRAVENOUS | Status: DC | PRN
  Administered 2023-01-10: 25 ug/min via INTRAVENOUS

## 2023-01-10 MED ORDER — CLOPIDOGREL BISULFATE 75 MG PO TABS
75.0000 mg | ORAL_TABLET | Freq: Every day | ORAL | Status: DC
  Administered 2023-01-11: 75 mg via ORAL
  Filled 2023-01-10: qty 1

## 2023-01-10 MED ORDER — ORAL CARE MOUTH RINSE: 15.0000 mL | Freq: Once | OROMUCOSAL | Status: AC

## 2023-01-10 MED ORDER — FENTANYL CITRATE (PF) 100 MCG/2ML IJ SOLN: 25.0000 ug | INTRAMUSCULAR | Status: DC | PRN

## 2023-01-10 MED ORDER — SUCCINYLCHOLINE CHLORIDE 200 MG/10ML IV SOSY
PREFILLED_SYRINGE | INTRAVENOUS | Status: DC | PRN
  Administered 2023-01-10: 140 mg via INTRAVENOUS

## 2023-01-10 MED ORDER — DEXAMETHASONE SODIUM PHOSPHATE 10 MG/ML IJ SOLN
INTRAMUSCULAR | Status: DC | PRN
  Administered 2023-01-10: 5 mg via INTRAVENOUS

## 2023-01-10 MED ORDER — FENTANYL CITRATE (PF) 100 MCG/2ML IJ SOLN
INTRAMUSCULAR | Status: DC | PRN
  Administered 2023-01-10: 100 ug via INTRAVENOUS

## 2023-01-10 MED ORDER — CLEVIDIPINE BUTYRATE 0.5 MG/ML IV EMUL
0.0000 mg/h | INTRAVENOUS | Status: DC
  Filled 2023-01-10: qty 50

## 2023-01-10 MED ORDER — IOHEXOL 300 MG/ML  SOLN
150.0000 mL | Freq: Once | INTRAMUSCULAR | Status: AC | PRN
  Administered 2023-01-10: 100 mL via INTRA_ARTERIAL

## 2023-01-10 MED ORDER — ACETAMINOPHEN 325 MG PO TABS
650.0000 mg | ORAL_TABLET | ORAL | Status: DC | PRN
  Administered 2023-01-10: 650 mg via ORAL
  Filled 2023-01-10: qty 2

## 2023-01-10 MED ORDER — LIDOCAINE 2% (20 MG/ML) 5 ML SYRINGE
INTRAMUSCULAR | Status: DC | PRN
  Administered 2023-01-10: 60 mg via INTRAVENOUS

## 2023-01-10 MED ORDER — NIMODIPINE 30 MG PO CAPS
0.0000 mg | ORAL_CAPSULE | ORAL | Status: AC
  Administered 2023-01-10: 30 mg via ORAL
  Filled 2023-01-10: qty 2

## 2023-01-10 MED ORDER — CLOPIDOGREL BISULFATE 75 MG PO TABS: 75.0000 mg | ORAL_TABLET | Freq: Every day | ORAL | Status: DC

## 2023-01-10 MED ORDER — ASPIRIN 81 MG PO CHEW: 243.0000 mg | CHEWABLE_TABLET | Freq: Once | ORAL | Status: AC

## 2023-01-10 MED ORDER — ONDANSETRON HCL 4 MG/2ML IJ SOLN
INTRAMUSCULAR | Status: DC | PRN
  Administered 2023-01-10: 4 mg via INTRAVENOUS

## 2023-01-10 MED ORDER — EPHEDRINE SULFATE-NACL 50-0.9 MG/10ML-% IV SOSY
PREFILLED_SYRINGE | INTRAVENOUS | Status: DC | PRN
  Administered 2023-01-10 (×2): 5 mg via INTRAVENOUS
  Administered 2023-01-10: 10 mg via INTRAVENOUS
  Administered 2023-01-10 (×2): 5 mg via INTRAVENOUS

## 2023-01-10 NOTE — Transfer of Care (Signed)
Immediate Anesthesia Transfer of Care Note  Patient: Calvin Smith  Procedure(s) Performed: Cervical right vertebral artery angioplasty/stenting  Patient Location: PACU  Anesthesia Type:General  Level of Consciousness: awake, alert , and oriented  Airway & Oxygen Therapy: Patient Spontanous Breathing and Patient connected to nasal cannula oxygen  Post-op Assessment: Report given to RN and Post -op Vital signs reviewed and stable  Post vital signs: Reviewed and stable  Last Vitals:  Vitals Value Taken Time  BP 118/66 01/10/23 1057  Temp    Pulse 96 01/10/23 1059  Resp 17 01/10/23 1059  SpO2 97 % 01/10/23 1059  Vitals shown include unvalidated device data.  Last Pain:  Vitals:   01/10/23 0731  TempSrc:   PainSc: 0-No pain         Complications: No notable events documented.

## 2023-01-10 NOTE — Sedation Documentation (Signed)
ACT: 134

## 2023-01-10 NOTE — Anesthesia Procedure Notes (Signed)
Arterial Line Insertion Start/End6/12/2022 8:00 AM, 01/10/2023 8:15 AM Performed by: Lelon Perla, CRNA, CRNA  Patient location: Pre-op. Preanesthetic checklist: patient identified, IV checked, site marked, risks and benefits discussed, surgical consent, monitors and equipment checked, pre-op evaluation, timeout performed and anesthesia consent Lidocaine 1% used for infiltration Left, radial was placed Catheter size: 20 G Hand hygiene performed  and maximum sterile barriers used   Attempts: 1 Procedure performed without using ultrasound guided technique. Following insertion, dressing applied and Biopatch. Post procedure assessment: normal and unchanged  Patient tolerated the procedure well with no immediate complications.

## 2023-01-10 NOTE — H&P (Signed)
 Chief Complaint: Patient was seen in consultation today for Right vertebral artery angioplasty/stent with possible left vertebral artery intrastent angioplasty/stent placement at the request of Dr Agbata,Tochukwu     Supervising Physician: De Macedo Rodrigues, [name]  Patient Status: [facility] - Out-pt  History of Present Illness: Calvin Smith is a 61 y.o. male    FULL code status per pt Recent consult with Dr Rodrigues 12/21/22:   Diabetes, hypertension, hyperlipidemia, migraines, sleep apnea who presented to the ED for evaluation after an episode of ataxic gait, vertigo and nausea. Symptoms resolved after 20 minutes. CT of the head without contrast showed no acute intracranial abnormality. CT angiogram of the head and neck showed multifocal severe stenosis in bilateral carotid bifurcation and origin of the vertebral arteries. He underwent uneventful stenting of the left vertebral artery ostium on March 24, 2022, left carotid angioplasty and stenting with cerebral protection device on April 17, 2022 and right carotid angioplasty and stenting with cerebral protection device on 05/24/2022. He complains of lightheadedness, dizziness and headache when moving head up and down and to the left side.   Interval history: Calvin Smith has been evaluated for Benign paroxysmal positional vertigo by Elizabeth Uhlenhake with ENT, but tests were negative. It was felt that dizziness is likely of vascular cause. Calvin Smith also referred 2 episodes of intense dizziness today which lasted approximately 15 minutes. He was gazing down before the symptoms started. He is concerned about increased frequency of episodes.  Calvin Smith continues to experience episodes of intense dizziness/lightheadedness and has had a negative evaluation by ENT for any inner ear pathology. Therefore, this is most likely of vascular origin. Possibility of stenting his stenotic right vertebral artery and/or performing in stent  angioplasty of his left vertebral artery stent were discussed. He would like to proceed with intervention. Risks and benefits were discussed. In anticipation to the procedure, he will start taking plavix 75 mg q.d. and continue taking ASA 81 mg q.d. We will schedule his procedure under general anesthesia.   Pt scheduled today for Right vertebral artery angioplasty/stent with possible Left vertebral artery Intrastent angioplasty/stent placement  Has no complaints Today Neuro intact Wife in room with pt   Past Medical History:  Diagnosis Date   Allergic rhinitis    Arm fracture, left as a child   DeQuervain's disease (tenosynovitis)    right wrist   Diabetes mellitus    type 2   Erectile dysfunction    GERD (gastroesophageal reflux disease)    Hyperlipidemia    Hypertension    Migraines    Peripheral vascular disease (HCC)    Sleep apnea    uses nightly   Stroke (HCC) 03/21/2022    Past Surgical History:  Procedure Laterality Date   Cardiolite  12/05/2004   Negative EF 66%   CARPAL TUNNEL RELEASE  04/07/2002   left    FRACTURE SURGERY     IR ANGIO INTRA EXTRACRAN SEL COM CAROTID INNOMINATE BILAT MOD SED  03/23/2022   IR ANGIO INTRA EXTRACRAN SEL COM CAROTID INNOMINATE BILAT MOD SED  05/24/2022   IR ANGIO INTRA EXTRACRAN SEL COM CAROTID INNOMINATE BILAT MOD SED  11/28/2022   IR ANGIO INTRA EXTRACRAN SEL COM CAROTID INNOMINATE UNI L MOD SED  04/17/2022   IR ANGIO VERTEBRAL SEL SUBCLAVIAN INNOMINATE BILAT MOD SED  03/23/2022   IR ANGIO VERTEBRAL SEL SUBCLAVIAN INNOMINATE UNI L MOD SED  04/17/2022   IR ANGIO VERTEBRAL SEL SUBCLAVIAN INNOMINATE UNI L MOD SED  05/24/2022     IR ANGIO VERTEBRAL SEL VERTEBRAL BILAT MOD SED  11/28/2022   IR ANGIO VERTEBRAL SEL VERTEBRAL UNI L MOD SED  03/29/2022   IR ANGIOGRAM EXTREMITY LEFT  03/29/2022   IR ANGIOGRAM FOLLOW UP STUDY  05/24/2022   IR INTRAVSC STENT CERV CAROTID W/EMB-PROT MOD SED INCL ANGIO  04/17/2022   IR INTRAVSC STENT CERV  CAROTID W/EMB-PROT MOD SED INCL ANGIO  05/24/2022   IR TRANSCATH EXCRAN VERT OR CAR A STENT  03/24/2022   IR US GUIDE VASC ACCESS RIGHT  03/23/2022   IR US GUIDE VASC ACCESS RIGHT  03/24/2022   IR US GUIDE VASC ACCESS RIGHT  04/17/2022   IR US GUIDE VASC ACCESS RIGHT  05/24/2022   IR US GUIDE VASC ACCESS RIGHT  11/28/2022   RADIOLOGY WITH ANESTHESIA Left 03/24/2022   Procedure: IR WITH ANESTHESIA;  Surgeon: de Macedo Rodrigues, [name], MD;  Location: [facility] OR;  Service: Radiology;  Laterality: Left;   RADIOLOGY WITH ANESTHESIA N/A 04/17/2022   Procedure: LEFT CAROTID STENT;  Surgeon: de Macedo Rodrigues, [name], MD;  Location: [facility] OR;  Service: Radiology;  Laterality: N/A;   RADIOLOGY WITH ANESTHESIA N/A 05/24/2022   Procedure: Right carotid angioplasty with possible stenting;  Surgeon: de Macedo Rodrigues, [name], MD;  Location: [facility] OR;  Service: Radiology;  Laterality: N/A;   WRIST SURGERY  08/07/2002   DeQuervain release   WRIST SURGERY Left 08/07/2006   ORIF l wrist DVR plate screwhead autologous    Allergies: Bisoprolol-hydrochlorothiazide and Codeine  Medications: Prior to Admission medications   Medication Sig Start Date End Date Taking? Authorizing Provider  aspirin EC 81 MG tablet Take 81 mg by mouth daily.    [provider]  atorvastatin (LIPITOR) 80 MG tablet TAKE 1 TABLET BY MOUTH DAILY 07/10/22   Letvak, Richard I, MD  clopidogrel (PLAVIX) 75 MG tablet Take 75 mg by mouth daily.    [provider]  CONTOUR NEXT TEST test strip USE TO CHECK BLOOD SUGAR DAILY 11/20/22   Letvak, Richard I, MD  glipiZIDE (GLUCOTROL XL) 5 MG 24 hr tablet TAKE 1 TABLET BY MOUTH TWICE  DAILY 01/08/23   Letvak, Richard I, MD  ibuprofen (ADVIL) 200 MG tablet Take 445 mg by mouth every 8 (eight) hours as needed for headache.    [provider]  insulin glargine (LANTUS SOLOSTAR) 100 UNIT/ML Solostar Pen Inject 42 Units into the skin 2 (two) times daily.    [provider]  Insulin Pen Needle (BD PEN NEEDLE NANO U/F) 32G X 4 MM MISC USE ONCE DAILY 09/01/22   Letvak, Richard I, MD  JARDIANCE 25 MG TABS tablet TAKE 1 TABLET BY MOUTH DAILY 08/15/22   Letvak, Richard I, MD  lisinopril (ZESTRIL) 20 MG tablet TAKE 1 TABLET BY MOUTH DAILY 08/15/22   Letvak, Richard I, MD  metFORMIN (GLUCOPHAGE) 1000 MG tablet TAKE 1 TABLET BY MOUTH TWICE  DAILY WITH MEALS 09/01/22   Letvak, Richard I, MD  pantoprazole (PROTONIX) 40 MG tablet Take 1 tablet (40 mg total) by mouth 2 (two) times daily. 08/16/22   Letvak, Richard I, MD  Semaglutide, 2 MG/DOSE, (OZEMPIC, 2 MG/DOSE,) 8 MG/3ML SOPN Inject 2 mg into the skin every Saturday.    [provider]  sildenafil (VIAGRA) 100 MG tablet 1/2 to 1 tab by mouth daily as needed. 10/18/22   Letvak, Richard I, MD  tamsulosin (FLOMAX) 0.4 MG CAPS capsule Take 1 capsule (0.4 mg total) by mouth daily. 11/13/22   Letvak, Richard I,   MD  Ubrogepant (UBRELVY) 100 MG TABS Take 100 mg by mouth as needed (Take 1 tablet at onset of headache, may repeat in 2 hours if needed, max is 200 mg in 24 hours). 04/26/22   Slack, Sarah J, NP     Family History  Problem Relation Age of Onset   Hypertension Father    Lymphoma Father        non-hodgkins   Uterine cancer Maternal Grandmother    Hypertension Paternal Grandfather    Lung cancer Paternal Aunt    Coronary artery disease Paternal Uncle    Colon cancer Unknown        in 1 relative (?aunt)    Social History   Socioeconomic History   Marital status: Married    Spouse name: Not on file   Number of children: Not on file   Years of education: Not on file   Highest education level: Not on file  Occupational History   Occupation: SUPERVISOR    Employer: [employer]    Comment: immunoassay dept  Tobacco Use   Smoking status: Never    Passive exposure: Past   Smokeless tobacco: Never  Vaping Use   Vaping Use: Never used  Substance and Sexual Activity   Alcohol use: Not Currently     Comment: rare   Drug use: No   Sexual activity: Yes    Partners: Female  Other Topics Concern   Not on file  Social History Narrative   Regular exercise: light to moderate   Caffeine use: coffee daily   Social Determinants of Health   Financial Resource Strain: Not on file  Food Insecurity: Not on file  Transportation Needs: Not on file  Physical Activity: Not on file  Stress: Not on file  Social Connections: Not on file    Review of Systems: A 12 point ROS discussed and pertinent positives are indicated in the HPI above.  All other systems are negative.  Review of Systems  Constitutional:  Negative for activity change, fatigue and fever.  HENT:  Negative for tinnitus and trouble swallowing.   Eyes:  Negative for visual disturbance.  Respiratory:  Negative for cough and shortness of breath.   Cardiovascular:  Negative for chest pain.  Gastrointestinal:  Negative for abdominal pain.  Neurological:  Positive for dizziness. Negative for tremors, seizures, syncope, facial asymmetry, speech difficulty, weakness, light-headedness, numbness and headaches.  Psychiatric/Behavioral:  Negative for behavioral problems and confusion.     Vital Signs: There were no vitals taken for this visit.    Physical Exam Vitals reviewed.  HENT:     Mouth/Throat:     Mouth: Mucous membranes are moist.  Cardiovascular:     Rate and Rhythm: Normal rate and regular rhythm.     Heart sounds: Normal heart sounds.  Pulmonary:     Effort: Pulmonary effort is normal.     Breath sounds: Normal breath sounds.  Abdominal:     Palpations: Abdomen is soft.     Tenderness: There is no abdominal tenderness.  Musculoskeletal:        General: No swelling or tenderness.     Right lower leg: No edema.     Left lower leg: No edema.  Skin:    General: Skin is warm.  Neurological:     Mental Status: He is alert and oriented to person, place, and time.  Psychiatric:        Behavior: Behavior normal.      Imaging: No results found.    Labs:  CBC: Recent Labs    05/24/22 0703 11/13/22 1055 11/28/22 0646 01/10/23 0643  WBC 6.6 7.6 7.1 7.7  HGB 14.7 15.4 15.4 16.4  HCT 42.1 44.0 43.0 48.0  PLT 227 246 234 249    COAGS: Recent Labs    03/21/22 1929 04/17/22 0709 05/24/22 0703 11/28/22 0646 01/10/23 0643  INR 1.1 1.1 1.1 1.1 1.1  APTT 28  --   --   --   --     BMP: Recent Labs    03/23/22 0240 04/17/22 0709 05/24/22 0703 10/31/22 1114 11/13/22 1055 11/28/22 0646  NA 130* 132* 138 137 136 135  K 3.9 4.0 3.9 4.6 4.2 4.0  CL 100 99 105 98 100 100  CO2 22 23 23 21 21 21*  GLUCOSE 100* 129* 133* 72 69* 84  BUN 10 18 15 13 14 16  CALCIUM 8.5* 8.7* 9.4 9.8 9.3 9.1  CREATININE 0.99 1.14 1.16 1.25 1.20 1.19  GFRNONAA >60 >60 >60  --   --  >60    LIVER FUNCTION TESTS: Recent Labs    03/21/22 1929 03/22/22 2220 03/23/22 0240 11/13/22 1055  BILITOT 1.8* 1.8* 1.5* 0.8  AST 36 33 30 25  ALT 41 45* 40 29  ALKPHOS 69 60 57 73  PROT 7.4 6.6 6.3* 6.8  ALBUMIN 4.3 3.6 3.4* 4.3    TUMOR MARKERS: No results for input(s): "AFPTM", "CEA", "CA199", "CHROMGRNA" in the last 8760 hours.  Assessment and Plan:  Scheduled today for right vertebral artery angioplasty/stent placement and possible left vertebral artery Intrastnet angioplasty/stent.- in NIR with Dr de MacedoRodrigues Risks and benefits of cerebral angiogram with intervention were discussed with the patient including, but not limited to bleeding, infection, vascular injury, contrast induced renal failure, stroke or even death.  This interventional procedure involves the use of X-rays and because of the nature of the planned procedure, it is possible that we will have prolonged use of X-ray fluoroscopy.  Potential radiation risks to you include (but are not limited to) the following: - A slightly elevated risk for cancer  several years later in life. This risk is typically less than 0.5% percent. This risk is  low in comparison to the normal incidence of human cancer, which is 33% for women and 50% for men according to the [employer]. - Radiation induced injury can include skin redness, resembling a rash, tissue breakdown / ulcers and hair loss (which can be temporary or permanent).   The likelihood of either of these occurring depends on the difficulty of the procedure and whether you are sensitive to radiation due to previous procedures, disease, or genetic conditions.   IF your procedure requires a prolonged use of radiation, you will be notified and given written instructions for further action.  It is your responsibility to monitor the irradiated area for the 2 weeks following the procedure and to notify your physician if you are concerned that you have suffered a radiation induced injury.    All of the patient's questions were answered, patient is agreeable to proceed.  Consent signed and in chart.  Thank you for this interesting consult.  I greatly enjoyed meeting Calvin Smith and look forward to participating in their care.  A copy of this report was sent to the requesting provider on this date.  Electronically Signed: Zareah Hunzeker A Tylar Merendino, PA-C 01/10/2023, 7:29 AM   I spent a total of    25 Minutes in face to face in clinical consultation,   greater than 50% of which was counseling/coordinating care for R VA angioplasty/stent and possible L VA Intrastent angioplasty/stent 

## 2023-01-10 NOTE — H&P (Deleted)
  The note originally documented on this encounter has been moved the the encounter in which it belongs.  

## 2023-01-10 NOTE — Anesthesia Postprocedure Evaluation (Signed)
Anesthesia Post Note  Patient: Calvin Smith  Procedure(s) Performed: Cervical right vertebral artery angioplasty/stenting     Patient location during evaluation: PACU Anesthesia Type: General Level of consciousness: awake and alert Pain management: pain level controlled Vital Signs Assessment: post-procedure vital signs reviewed and stable Respiratory status: spontaneous breathing, nonlabored ventilation, respiratory function stable and patient connected to nasal cannula oxygen Cardiovascular status: blood pressure returned to baseline and stable Postop Assessment: no apparent nausea or vomiting Anesthetic complications: no   No notable events documented.  Last Vitals:  Vitals:   01/10/23 1315 01/10/23 1330  BP:    Pulse: 89 88  Resp: 17 (!) 21  Temp:    SpO2: 98% 98%    Last Pain:  Vitals:   01/10/23 1057  TempSrc:   PainSc: 0-No pain                 Alyss Granato S

## 2023-01-10 NOTE — Progress Notes (Signed)
1210:  Patient transferred from PACU to 4NICU.  First vitals charted at this time.  Patient is alert and oriented.  Groin site is clean, dry, and intact.  Pulses palpable.  Education provided to patient regarding laying flat until 1700.  Patient's family notified.  No belongings noted.

## 2023-01-10 NOTE — Anesthesia Procedure Notes (Signed)
Procedure Name: Intubation Date/Time: 01/10/2023 8:44 AM  Performed by: Lelon Perla, CRNAPre-anesthesia Checklist: Patient identified, Emergency Drugs available, Suction available and Patient being monitored Patient Re-evaluated:Patient Re-evaluated prior to induction Oxygen Delivery Method: Circle system utilized Preoxygenation: Pre-oxygenation with 100% oxygen Induction Type: IV induction, Rapid sequence and Cricoid Pressure applied Laryngoscope Size: Glidescope and 4 Grade View: Grade I Tube type: Oral Tube size: 7.5 mm Number of attempts: 1 Airway Equipment and Method: Stylet and Oral airway Placement Confirmation: ETT inserted through vocal cords under direct vision, positive ETCO2 and breath sounds checked- equal and bilateral Secured at: 23 cm Tube secured with: Tape Dental Injury: Teeth and Oropharynx as per pre-operative assessment  Comments: Elective glidescope due to small oral opening and short TMD

## 2023-01-10 NOTE — Sedation Documentation (Signed)
ACT: 476

## 2023-01-10 NOTE — Sedation Documentation (Signed)
ACT 238

## 2023-01-10 NOTE — Progress Notes (Signed)
Brief post procedure note:  Patient seen with Dr Tommie Sams in ICU, wife at bedside. He denies any complaints, is hopeful to go home tomorrow. Per RN A-line reading SBP 160s while cuff reading SBPs 110s, patient baseline BP pre-procedure 120/70 mmHg. On exam A-line appears to be positional as when hand is dorsiflexed SBP decreases 20 points.   Right CFA puncture site clean, dry, dressed appropriately. Soft, non tender, no active bleeding. Neuro exam unremarkable.  Plan: - Discontinue A-line, use BP cuff - SBP goal 100-160 - Continue ASA 325 mg QD + Plavix 75 mg QD, will plan to continue this regimen as outpatient for next 6 months and then will discuss decreasing ASA at follow up visit with Dr Tommi Rumps Melchor Amour - Discharge tomorrow if remains stable  Please call Dr Tommie Sams with questions or concerns overnight.  Lynnette Caffey, PA-C

## 2023-01-11 ENCOUNTER — Other Ambulatory Visit: Payer: Self-pay | Admitting: Physician Assistant

## 2023-01-11 ENCOUNTER — Encounter (HOSPITAL_COMMUNITY): Payer: Self-pay | Admitting: Neuroradiology

## 2023-01-11 DIAGNOSIS — G450 Vertebro-basilar artery syndrome: Secondary | ICD-10-CM

## 2023-01-11 LAB — CBC
HCT: 40.6 % (ref 39.0–52.0)
Hemoglobin: 14.2 g/dL (ref 13.0–17.0)
MCH: 32 pg (ref 26.0–34.0)
MCHC: 35 g/dL (ref 30.0–36.0)
MCV: 91.4 fL (ref 80.0–100.0)
Platelets: 241 10*3/uL (ref 150–400)
RBC: 4.44 MIL/uL (ref 4.22–5.81)
RDW: 12.6 % (ref 11.5–15.5)
WBC: 11.1 10*3/uL — ABNORMAL HIGH (ref 4.0–10.5)
nRBC: 0 % (ref 0.0–0.2)

## 2023-01-11 LAB — COMPREHENSIVE METABOLIC PANEL
ALT: 24 U/L (ref 0–44)
AST: 32 U/L (ref 15–41)
Albumin: 3.5 g/dL (ref 3.5–5.0)
Alkaline Phosphatase: 64 U/L (ref 38–126)
Anion gap: 11 (ref 5–15)
BUN: 18 mg/dL (ref 8–23)
CO2: 21 mmol/L — ABNORMAL LOW (ref 22–32)
Calcium: 8.5 mg/dL — ABNORMAL LOW (ref 8.9–10.3)
Chloride: 99 mmol/L (ref 98–111)
Creatinine, Ser: 1.26 mg/dL — ABNORMAL HIGH (ref 0.61–1.24)
GFR, Estimated: >60 mL/min (ref >60)
Glucose, Bld: 216 mg/dL — ABNORMAL HIGH (ref 70–99)
Potassium: 3.8 mmol/L (ref 3.5–5.1)
Sodium: 131 mmol/L — ABNORMAL LOW (ref 135–145)
Total Bilirubin: 1.1 mg/dL (ref 0.3–1.2)
Total Protein: 6.3 g/dL — ABNORMAL LOW (ref 6.5–8.1)

## 2023-01-11 LAB — GLUCOSE, CAPILLARY
Glucose-Capillary: 111 mg/dL — ABNORMAL HIGH (ref 70–99)
Glucose-Capillary: 132 mg/dL — ABNORMAL HIGH (ref 70–99)
Glucose-Capillary: 175 mg/dL — ABNORMAL HIGH (ref 70–99)

## 2023-01-11 MED ORDER — ASPIRIN 325 MG PO TABS: 325.0000 mg | ORAL_TABLET | Freq: Every day | ORAL | 0 refills | Status: DC

## 2023-01-11 NOTE — Progress Notes (Signed)
Called phlebotomy regarding morning labs.  Labs delayed but will be done as soon as possible.

## 2023-01-11 NOTE — Discharge Summary (Signed)
Patient ID: Calvin Smith MRN: [id number] DOB/AGE: 11-24-1960 62 y.o.  Admit date: 01/10/2023 Discharge date: 01/11/2023  Supervising Physician: Baldemar Lenis  Patient Status: [facility] - In-pt  Admission Diagnoses: Vertebral insufficiency  Discharge Diagnoses:  Principal Problem:   Vertebral artery stenosis, symptomatic, without infarction, right   Discharged Condition: good  Hospital Course:   Calvin Smith is a 62 year old male with past medical history significant for diabetes, hypertension, hyperlipidemia, migraines, sleep apnea who presented to the ED for evaluation after an episode of ataxic gait, vertigo and nausea.   Symptoms resolved after 20 minutes.   CTA head and neck showed multifocal severe stenosis in bilateral carotid bifurcation and origin of the vertebral arteries.  He underwent uneventful stenting of the left vertebral artery ostium on March 24, 2022, left carotid angioplasty and stenting with cerebral protection device on April 17, 2022 and right carotid angioplasty and stenting with cerebral protection device on 05/24/2022.   Approximately 6 months ago he started having lightheadedness, dizziness and headache when moving head up and down and to the left side.   Cerebral angiogram done November 28, 2022 showed dynamic stenosis of the left vertebral artery stent when moving the head down to the left side.   Approximately 60% stenosis of origin of the right vertebral artery was also seen. He returned yesterday for right vertebral artery origin stenting.    Consults: None  Treatments:  Successful stenting and angioplasty of stenotic right V1/vertebral artery stenosis with a balloon mounted drug-eluting stent by Dr. Tommie Sams.    Discharge Exam: Blood pressure 132/85, pulse 92, temperature 98.5 F (36.9 C), temperature source Oral, resp. rate 20, height 5' 6.5" (1.689 m), weight 175 lb (79.4 kg), SpO2 93 %. Alert, awake, and  oriented x3. Speech and comprehension intact. PERRL bilaterally. EOMs intact bilaterally without nystagmus or subjective diplopia. Visual fields intact bilaterally. No facial asymmetry. Tongue midline. Motor power symmetric proportional to effort. No pronator drift. Fine motor and coordination intact and symmetric. 5/5 Strength bilaterally Common femoral artery puncture site looks good, no bleeding, no hematoma, no pseudoaneurysm Gait not assessed.  Lab Results  Component Value Date   WBC 11.1 (H) 01/11/2023   HGB 14.2 01/11/2023   HCT 40.6 01/11/2023   MCV 91.4 01/11/2023   PLT 241 01/11/2023   Lab Results  Component Value Date   CREATININE 1.26 (H) 01/11/2023   BUN 18 01/11/2023   NA 131 (L) 01/11/2023   K 3.8 01/11/2023   CL 99 01/11/2023   CO2 21 (L) 01/11/2023   Lab Results  Component Value Date   ALT 24 01/11/2023   AST 32 01/11/2023   ALKPHOS 64 01/11/2023   BILITOT 1.1 01/11/2023    Disposition: Discharge disposition: 01-Home or Self Care     Home  Patient will follow up in about 3 weeks. IR scheduler will call patient with appointment details.  Discharge Instructions     Call MD for:  redness, tenderness, or signs of infection (pain, swelling, redness, odor or green/yellow discharge around incision site)   Complete by: As directed    Diet general   Complete by: As directed    Increase activity slowly   Complete by: As directed    Remove dressing in 24 hours   Complete by: As directed       Allergies as of 01/11/2023       Reactions   Bisoprolol-hydrochlorothiazide Other (See Comments)    E.D. Unknown per Pt  Codeine Itching        Medication List     STOP taking these medications    aspirin EC 81 MG tablet Replaced by: aspirin 325 MG tablet       TAKE these medications    aspirin 325 MG tablet Take 1 tablet (325 mg total) by mouth daily. Start taking on: January 12, 2023 Replaces: aspirin EC 81 MG tablet   atorvastatin 80  MG tablet Commonly known as: LIPITOR TAKE 1 TABLET BY MOUTH DAILY   BD Pen Needle Nano U/F 32G X 4 MM Misc Generic drug: Insulin Pen Needle USE ONCE DAILY   clopidogrel 75 MG tablet Commonly known as: PLAVIX Take 75 mg by mouth daily.   Contour Next Test test strip Generic drug: glucose blood USE TO CHECK BLOOD SUGAR DAILY   glipiZIDE 5 MG 24 hr tablet Commonly known as: GLUCOTROL XL TAKE 1 TABLET BY MOUTH TWICE  DAILY   ibuprofen 200 MG tablet Commonly known as: ADVIL Take 445 mg by mouth every 8 (eight) hours as needed for headache.   Jardiance 25 MG Tabs tablet Generic drug: empagliflozin TAKE 1 TABLET BY MOUTH DAILY   Lantus SoloStar 100 UNIT/ML Solostar Pen Generic drug: insulin glargine Inject 42 Units into the skin 2 (two) times daily.   lisinopril 20 MG tablet Commonly known as: ZESTRIL TAKE 1 TABLET BY MOUTH DAILY   metFORMIN 1000 MG tablet Commonly known as: GLUCOPHAGE TAKE 1 TABLET BY MOUTH TWICE  DAILY WITH MEALS   Ozempic (2 MG/DOSE) 8 MG/3ML Sopn Generic drug: Semaglutide (2 MG/DOSE) Inject 2 mg into the skin every Saturday.   pantoprazole 40 MG tablet Commonly known as: PROTONIX Take 1 tablet (40 mg total) by mouth 2 (two) times daily.   sildenafil 100 MG tablet Commonly known as: VIAGRA 1/2 to 1 tab by mouth daily as needed.   tamsulosin 0.4 MG Caps capsule Commonly known as: FLOMAX Take 1 capsule (0.4 mg total) by mouth daily.   Ubrelvy 100 MG Tabs Generic drug: Ubrogepant Take 100 mg by mouth as needed (Take 1 tablet at onset of headache, may repeat in 2 hours if needed, max is 200 mg in 24 hours).           Calvin Smith Calvin Placzek PA-C 01/11/2023 11:47 AM        I have spent Greater Than 30 Minutes discharging Calvin Smith.

## 2023-01-11 NOTE — Progress Notes (Signed)
Frequent PVCs on telemetry. MD paged and VO for routine labs received.

## 2023-01-12 ENCOUNTER — Telehealth: Payer: Self-pay

## 2023-01-12 NOTE — Transitions of Care (Post Inpatient/ED Visit) (Unsigned)
01/12/2023  Name: Calvin Smith MRN: [id number] DOB: 1961/01/01  Today's TOC FU Call Status: Today's TOC FU Call Status:: Successful TOC FU Call Competed  Transition Care Management Follow-up Telephone Call Discharge Facility: [facility]) Type of Discharge: Inpatient Admission Primary Inpatient Discharge Diagnosis:: vertbral artery stenosis Reason for ED Visit: Other: How have you been since you were released from the hospital?: Better Any questions or concerns?: No  Items Reviewed: Did you receive and understand the discharge instructions provided?: Yes Medications obtained,verified, and reconciled?: Yes (Medications Reviewed) Any new allergies since your discharge?: No Dietary orders reviewed?: No Do you have support at home?: Yes People in Home: spouse  Medications Reviewed Today: Medications Reviewed Today     Reviewed by Annabell Sabal, CMA (Certified Medical Assistant) on 01/12/23 at [phone]  Med List Status: <None>   Medication Order Taking? Sig Documenting Provider Last Dose Status Informant  aspirin 325 MG tablet [id number] Yes Take 1 tablet (325 mg total) by mouth daily. Gershon Crane, PA-C Taking Active   atorvastatin (LIPITOR) 80 MG tablet [id number] Yes TAKE 1 TABLET BY MOUTH DAILY Karie Schwalbe, MD Taking Active Self  clopidogrel (PLAVIX) 75 MG tablet [id number] Yes Take 75 mg by mouth daily. [provider] Taking Active Self  CONTOUR NEXT TEST test strip [id number] Yes USE TO CHECK BLOOD SUGAR DAILY Karie Schwalbe, MD Taking Active Self  glipiZIDE (GLUCOTROL XL) 5 MG 24 hr tablet [id number] Yes TAKE 1 TABLET BY MOUTH TWICE  DAILY Karie Schwalbe, MD Taking Active   ibuprofen (ADVIL) 200 MG tablet [id number] Yes Take 445 mg by mouth every 8 (eight) hours as needed for headache. [provider] Taking Active Self           Med Note Mattie Marlin, Pablo Lawrence   Wed Jan 10, 2023  7:17 AM) 21 units   insulin glargine (LANTUS SOLOSTAR) 100  UNIT/ML Solostar Pen [id number] Yes Inject 42 Units into the skin 2 (two) times daily. [provider] Taking Active Self           Med Note Gunnar Fusi, MELISSA R   Fri Jan 05, 2023  9:12 AM)    Insulin Pen Needle (BD PEN NEEDLE NANO U/F) 32G X 4 MM MISC [id number] Yes USE ONCE DAILY Karie Schwalbe, MD Taking Active Self  JARDIANCE 25 MG TABS tablet [id number] Yes TAKE 1 TABLET BY MOUTH DAILY Karie Schwalbe, MD Taking Active Self  lisinopril (ZESTRIL) 20 MG tablet [id number] Yes TAKE 1 TABLET BY MOUTH DAILY Karie Schwalbe, MD Taking Active Self  metFORMIN (GLUCOPHAGE) 1000 MG tablet [id number] Yes TAKE 1 TABLET BY MOUTH TWICE  DAILY WITH MEALS Karie Schwalbe, MD Taking Active Self  pantoprazole (PROTONIX) 40 MG tablet [id number] Yes Take 1 tablet (40 mg total) by mouth 2 (two) times daily. Karie Schwalbe, MD Taking Active Self  Semaglutide, 2 MG/DOSE, (OZEMPIC, 2 MG/DOSE,) 8 MG/3ML SOPN [id number] Yes Inject 2 mg into the skin every Saturday. [provider] Taking Active Self  sildenafil (VIAGRA) 100 MG tablet [id number] Yes 1/2 to 1 tab by mouth daily as needed. Karie Schwalbe, MD Taking Active Self  tamsulosin [facility]) 0.4 MG CAPS capsule [id number] Yes Take 1 capsule (0.4 mg total) by mouth daily. Karie Schwalbe, MD Taking Active Self  Ubrogepant (UBRELVY) 100 MG TABS [id number] Yes Take 100 mg by mouth as needed (Take 1 tablet at onset of headache, may repeat in 2 hours if needed, max is  200 mg in 24 hours). Glean Salvo, NP Taking Active Self            Home Care and Equipment/Supplies: Were Home Health Services Ordered?: No Any new equipment or medical supplies ordered?: No  Functional Questionnaire: Do you need assistance with bathing/showering or dressing?: No Do you need assistance with meal preparation?: No Do you need assistance with eating?: No Do you have difficulty maintaining continence: No Do you need assistance with getting out of bed/getting  out of a chair/moving?: No Do you have difficulty managing or taking your medications?: No  Follow up appointments reviewed: PCP Follow-up appointment confirmed?: No MD Provider Line Number:[phone] Given: Yes Specialist Hospital Follow-up appointment confirmed?: NA Do you need transportation to your follow-up appointment?: No Do you understand care options if your condition(s) worsen?: Yes-patient verbalized understanding    SIGNATURE Fredirick Maudlin

## 2023-01-23 NOTE — Progress Notes (Unsigned)
    Sylar Voong T. Kahlin Mark, MD, CAQ Sports Medicine [facility] [address] [place], [address]  Phone: [phone]  FAX: [phone]  Calvin Smith - 62 y.o. male  MRN [id number]  Date of Birth: 07-Nov-1960  Date: 01/24/2023  PCP: Karie Schwalbe, MD  Referral: Karie Schwalbe, MD  No chief complaint on file.   Is a very pleasant patient, and have seen him before for various issues.  I saw him for a trigger finger on the right second digit in November 2023, that point I did do a trigger finger injection.

## 2023-01-24 ENCOUNTER — Ambulatory Visit (INDEPENDENT_AMBULATORY_CARE_PROVIDER_SITE_OTHER): Payer: 59 | Admitting: Family Medicine

## 2023-01-24 ENCOUNTER — Encounter: Payer: Self-pay | Admitting: Family Medicine

## 2023-01-24 VITALS — BP 100/60 | HR 83 | Temp 97.4°F | Ht 66.0 in | Wt 174.4 lb

## 2023-01-24 DIAGNOSIS — M65331 Trigger finger, right middle finger: Secondary | ICD-10-CM

## 2023-01-24 DIAGNOSIS — M65321 Trigger finger, right index finger: Secondary | ICD-10-CM | POA: Diagnosis not present

## 2023-01-24 MED ORDER — TRIAMCINOLONE ACETONIDE 40 MG/ML IJ SUSP
20.0000 mg | Freq: Once | INTRAMUSCULAR | Status: AC
Start: 2023-01-24 — End: 2023-01-24
  Administered 2023-01-24: 20 mg via INTRA_ARTICULAR

## 2023-01-24 NOTE — Addendum Note (Signed)
Addended by: Damita Lack on: 01/24/2023 09:32 AM   Modules accepted: Orders

## 2023-01-31 ENCOUNTER — Ambulatory Visit (HOSPITAL_COMMUNITY)
Admission: RE | Admit: 2023-01-31 | Discharge: 2023-01-31 | Disposition: A | Discharge status: Home / Self Care | Payer: 59 | Source: Ambulatory Visit | Attending: Physician Assistant | Admitting: Physician Assistant

## 2023-01-31 DIAGNOSIS — G450 Vertebro-basilar artery syndrome: Secondary | ICD-10-CM

## 2023-01-31 NOTE — Progress Notes (Signed)
Referring Physician(s): Gershon Crane  Chief Complaint: The patient is seen in follow up today s/p right vertebral artery stenting.  History of present illness:  62 year old male with past medical history significant for diabetes, hypertension, hyperlipidemia, migraines, sleep apnea and TIA. He has significant atherosclerotic disease of the major neck arteries status post left vertebral artery and bilateral carotid arteries stenting.  Most recently, he developed  lightheadedness, dizziness and headache when moving head up and down and to the left side.  Cerebral angiogram showed decreased flow through the left vertebral artery stent when turning the head down to the left.  On 01/10/2023, he underwent elective angioplasty and stenting of a severely stenotic right vertebral artery origin.  He returns today for follow-up.  He is feeling well without any focal neurological deficit.  Lightheadedness when turning the head has resolved.  However, he refers some lightheadedness when standing up from bending down.   Past Medical History:  Diagnosis Date   Allergic rhinitis    Arm fracture, left as a child   DeQuervain's disease (tenosynovitis)    right wrist   Diabetes mellitus    type 2   Erectile dysfunction    GERD (gastroesophageal reflux disease)    Hyperlipidemia    Hypertension    Migraines    Peripheral vascular disease (HCC)    Sleep apnea    uses nightly   Stroke (HCC) 03/21/2022    Past Surgical History:  Procedure Laterality Date   Cardiolite  12/05/2004   Negative EF 66%   CARPAL TUNNEL RELEASE  04/07/2002   left    FRACTURE SURGERY     IR ANGIO INTRA EXTRACRAN SEL COM CAROTID INNOMINATE BILAT MOD SED  03/23/2022   IR ANGIO INTRA EXTRACRAN SEL COM CAROTID INNOMINATE BILAT MOD SED  05/24/2022   IR ANGIO INTRA EXTRACRAN SEL COM CAROTID INNOMINATE BILAT MOD SED  11/28/2022   IR ANGIO INTRA EXTRACRAN SEL COM CAROTID INNOMINATE UNI L MOD SED  04/17/2022   IR ANGIO  VERTEBRAL SEL SUBCLAVIAN INNOMINATE BILAT MOD SED  03/23/2022   IR ANGIO VERTEBRAL SEL SUBCLAVIAN INNOMINATE UNI L MOD SED  04/17/2022   IR ANGIO VERTEBRAL SEL SUBCLAVIAN INNOMINATE UNI L MOD SED  05/24/2022   IR ANGIO VERTEBRAL SEL SUBCLAVIAN INNOMINATE UNI L MOD SED  01/10/2023   IR ANGIO VERTEBRAL SEL VERTEBRAL BILAT MOD SED  11/28/2022   IR ANGIO VERTEBRAL SEL VERTEBRAL UNI L MOD SED  03/29/2022   IR ANGIO VERTEBRAL SEL VERTEBRAL UNI R MOD SED  01/10/2023   IR ANGIOGRAM EXTREMITY LEFT  03/29/2022   IR ANGIOGRAM FOLLOW UP STUDY  05/24/2022   IR INTRA CRAN STENT  01/10/2023   IR INTRAVSC STENT CERV CAROTID W/EMB-PROT MOD SED INCL ANGIO  04/17/2022   IR INTRAVSC STENT CERV CAROTID W/EMB-PROT MOD SED INCL ANGIO  05/24/2022   IR TRANSCATH EXCRAN VERT OR CAR A STENT  03/24/2022   IR US GUIDE VASC ACCESS RIGHT  03/23/2022   IR US GUIDE VASC ACCESS RIGHT  03/24/2022   IR US GUIDE VASC ACCESS RIGHT  04/17/2022   IR US GUIDE VASC ACCESS RIGHT  05/24/2022   IR US GUIDE VASC ACCESS RIGHT  11/28/2022   IR US GUIDE VASC ACCESS RIGHT  01/10/2023   RADIOLOGY WITH ANESTHESIA Left 03/24/2022   Procedure: IR WITH ANESTHESIA;  Surgeon: Baldemar Lenis, MD;  Location: [facility] OR;  Service: Radiology;  Laterality: Left;   RADIOLOGY WITH ANESTHESIA N/A 04/17/2022   Procedure: LEFT  CAROTID STENT;  Surgeon: Baldemar Lenis, MD;  Location: [facility] OR;  Service: Radiology;  Laterality: N/A;   RADIOLOGY WITH ANESTHESIA N/A 05/24/2022   Procedure: Right carotid angioplasty with possible stenting;  Surgeon: de Glori Luis, MD;  Location: [facility] OR;  Service: Radiology;  Laterality: N/A;   RADIOLOGY WITH ANESTHESIA N/A 01/10/2023   Procedure: Cervical right vertebral artery angioplasty/stenting;  Surgeon: Baldemar Lenis, MD;  Location: [facility] OR;  Service: Radiology;  Laterality: N/A;   WRIST SURGERY  08/07/2002   DeQuervain release   WRIST SURGERY Left 08/07/2006   ORIF l wrist DVR  plate screwhead autologous    Allergies: Bisoprolol-hydrochlorothiazide and Codeine  Medications: Prior to Admission medications   Medication Sig Start Date End Date Taking? Authorizing Provider  aspirin 325 MG tablet Take 1 tablet (325 mg total) by mouth daily. 01/12/23   Gershon Crane, PA-C  atorvastatin (LIPITOR) 80 MG tablet TAKE 1 TABLET BY MOUTH DAILY 07/10/22   Tillman Abide I, MD  clopidogrel (PLAVIX) 75 MG tablet Take 75 mg by mouth daily.    [provider]  CONTOUR NEXT TEST test strip USE TO CHECK BLOOD SUGAR DAILY 11/20/22   Tillman Abide I, MD  glipiZIDE (GLUCOTROL XL) 5 MG 24 hr tablet TAKE 1 TABLET BY MOUTH TWICE  DAILY 01/08/23   Tillman Abide I, MD  ibuprofen (ADVIL) 200 MG tablet Take 445 mg by mouth every 8 (eight) hours as needed for headache.    [provider]  insulin glargine (LANTUS SOLOSTAR) 100 UNIT/ML Solostar Pen Inject 42 Units into the skin 2 (two) times daily.    [provider]  Insulin Pen Needle (BD PEN NEEDLE NANO U/F) 32G X 4 MM MISC USE ONCE DAILY 09/01/22   Tillman Abide I, MD  JARDIANCE 25 MG TABS tablet TAKE 1 TABLET BY MOUTH DAILY 08/15/22   Tillman Abide I, MD  lisinopril (ZESTRIL) 20 MG tablet TAKE 1 TABLET BY MOUTH DAILY 08/15/22   Tillman Abide I, MD  metFORMIN (GLUCOPHAGE) 1000 MG tablet TAKE 1 TABLET BY MOUTH TWICE  DAILY WITH MEALS 09/01/22   Karie Schwalbe, MD  pantoprazole (PROTONIX) 40 MG tablet Take 1 tablet (40 mg total) by mouth 2 (two) times daily. 08/16/22   Karie Schwalbe, MD  Semaglutide, 2 MG/DOSE, (OZEMPIC, 2 MG/DOSE,) 8 MG/3ML SOPN Inject 2 mg into the skin every Saturday.    [provider]  sildenafil (VIAGRA) 100 MG tablet 1/2 to 1 tab by mouth daily as needed. 10/18/22   Karie Schwalbe, MD  tamsulosin (FLOMAX) 0.4 MG CAPS capsule Take 1 capsule (0.4 mg total) by mouth daily. 11/13/22   Karie Schwalbe, MD  Ubrogepant (UBRELVY) 100 MG TABS Take 100 mg by mouth as needed (Take 1  tablet at onset of headache, may repeat in 2 hours if needed, max is 200 mg in 24 hours). 04/26/22   Glean Salvo, NP     Family History  Problem Relation Age of Onset   Hypertension Father    Lymphoma Father        non-hodgkins   Uterine cancer Maternal Grandmother    Hypertension Paternal Grandfather    Lung cancer Paternal Aunt    Coronary artery disease Paternal Uncle    Colon cancer Unknown        in 1 relative (?aunt)    Social History   Socioeconomic History   Marital status: Married    Spouse name: Not on  file   Number of children: Not on file   Years of education: Not on file   Highest education level: Bachelor's degree (e.g., BA, AB, BS)  Occupational History   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  Tobacco Use   Smoking status: Never    Passive exposure: Past   Smokeless tobacco: Never  Vaping Use   Vaping Use: Never used  Substance and Sexual Activity   Alcohol use: Not Currently    Comment: rare   Drug use: No   Sexual activity: Yes    Partners: Female  Other Topics Concern   Not on file  Social History Narrative   Regular exercise: light to moderate   Caffeine use: coffee daily   Social Determinants of Health   Financial Resource Strain: Low Risk  (01/20/2023)   Overall Financial Resource Strain (CARDIA)    Difficulty of Paying Living Expenses: Not hard at all  Food Insecurity: No Food Insecurity (01/20/2023)   Hunger Vital Sign    Worried About Running Out of Food in the Last Year: Never true    Ran Out of Food in the Last Year: Never true  Transportation Needs: No Transportation Needs (01/20/2023)   PRAPARE - Administrator, Civil Service (Medical): No    Lack of Transportation (Non-Medical): No  Physical Activity: Sufficiently Active (01/20/2023)   Exercise Vital Sign    Days of Exercise per Week: 3 days    Minutes of Exercise per Session: 50 min  Stress: No Stress Concern Present (01/20/2023)    [employer] of Occupational Health - Occupational Stress Questionnaire    Feeling of Stress : Only a little  Social Connections: Socially Integrated (01/20/2023)   Social Connection and Isolation Panel [NHANES]    Frequency of Communication with Friends and Family: More than three times a week    Frequency of Social Gatherings with Friends and Family: Once a week    Attends Religious Services: More than 4 times per year    Active Member of [employer] or Organizations: Yes    Attends Engineer, structural: More than 4 times per year    Marital Status: Married     Vital Signs: There were no vitals taken for this visit.  Physical Exam Constitutional:      Appearance: Normal appearance.  HENT:     Head: Normocephalic and atraumatic.     Mouth/Throat:     Mouth: Mucous membranes are moist.     Pharynx: Oropharynx is clear.  Eyes:     Extraocular Movements: Extraocular movements intact.     Conjunctiva/sclera: Conjunctivae normal.  Pulmonary:     Effort: Pulmonary effort is normal.  Neurological:     Mental Status: He is alert and oriented to person, place, and time. Mental status is at baseline.     Cranial Nerves: Cranial nerves 2-12 are intact.     Motor: Motor function is intact.     Coordination: Coordination is intact.     Gait: Gait is intact.     Imaging: No results found.  Labs:  CBC: Recent Labs    11/13/22 1055 11/28/22 0646 01/10/23 0643 01/11/23 0955  WBC 7.6 7.1 7.7 11.1*  HGB 15.4 15.4 16.4 14.2  HCT 44.0 43.0 48.0 40.6  PLT 246 234 249 241    COAGS: Recent Labs    03/21/22 1929 04/17/22 0709 05/24/22 0703 11/28/22 0646 01/10/23 0643  INR 1.1 1.1 1.1 1.1  1.1  APTT 28  --   --   --   --     BMP: Recent Labs    05/24/22 0703 10/31/22 1114 11/13/22 1055 11/28/22 0646 01/10/23 0643 01/11/23 0955  NA 138   < > 136 135 133* 131*  K 3.9   < > 4.2 4.0 4.5 3.8  CL 105   < > 100 100 97* 99  CO2 23   < > 21 21* 25 21*  GLUCOSE  133*   < > 69* 84 100* 216*  BUN 15   < > 14 16 15 18   CALCIUM 9.4   < > 9.3 9.1 9.5 8.5*  CREATININE 1.16   < > 1.20 1.19 1.36* 1.26*  GFRNONAA >60  --   --  >60 59* >60   < > = values in this interval not displayed.    LIVER FUNCTION TESTS: Recent Labs    03/22/22 2220 03/23/22 0240 11/13/22 1055 01/11/23 0955  BILITOT 1.8* 1.5* 0.8 1.1  AST 33 30 25 32  ALT 45* 40 29 24  ALKPHOS 60 57 73 64  PROT 6.6 6.3* 6.8 6.3*  ALBUMIN 3.6 3.4* 4.3 3.5    Assessment:  Mr. Simonich is doing well after his right vertebral artery origin angioplasty and stenting.  Head positional lightheadedness has resolved.  I believe transient lightheadedness when standing up from lower/bending position could be related to some mild orthostatic hypotension.  Will we will obtain an MR angiogram of the neck in 6 months.  At that time, we will re-evaluate need for dual anti-platelet therapy versus single anti-platelet. He may decrease his aspirin from 325 mg to 81 mg today while continuing on Plavix 75 mg/day.   Signed: Baldemar Lenis, MD 01/31/2023, 1:56 PM    I spent a total of    25 Minutes in face to face in clinical consultation, greater than 50% of which was counseling/coordinating care for atherosclerotic disease status port right vertebral artery stenting.

## 2023-03-13 ENCOUNTER — Other Ambulatory Visit: Payer: Self-pay | Admitting: Internal Medicine

## 2023-03-13 NOTE — Addendum Note (Signed)
Encounter addended by: Melburn Popper on: 03/13/2023 7:53 AM  Actions taken: Imaging Exam ended

## 2023-04-19 ENCOUNTER — Ambulatory Visit: Payer: 59 | Admitting: Internal Medicine

## 2023-04-19 ENCOUNTER — Encounter: Payer: Self-pay | Admitting: Internal Medicine

## 2023-04-19 VITALS — BP 130/76 | HR 84 | Temp 97.4°F | Ht 66.0 in | Wt 178.0 lb

## 2023-04-19 DIAGNOSIS — S92414G Nondisplaced fracture of proximal phalanx of right great toe, subsequent encounter for fracture with delayed healing: Secondary | ICD-10-CM | POA: Insufficient documentation

## 2023-04-19 NOTE — Progress Notes (Signed)
Subjective:    Patient ID: Calvin Smith, male    DOB: 1961-01-23, 62 y.o.   MRN: [id number]  HPI Here with wife due to left foot issues  Broke left great toe 8/4 Then last week---was putting down landscape timbers onto cart Cart tipped--hit right leg--then fell on left toe again Back to urgent care and fracture noted again No note of healing on the x-ray  It looks very angry----dark since the original fracutre Now more angry and new lump on lateral forefoot (dorsum) Using walking shoe (small) some of the time Occasional ibuprofen ---does help  No major pain in right ankle Still some swelling  Current Outpatient Medications on File Prior to Visit  Medication Sig Dispense Refill   aspirin 325 MG tablet Take 1 tablet (325 mg total) by mouth daily. 90 tablet 0   atorvastatin (LIPITOR) 80 MG tablet TAKE 1 TABLET BY MOUTH DAILY 90 tablet 3   clopidogrel (PLAVIX) 75 MG tablet Take 75 mg by mouth daily.     CONTOUR NEXT TEST test strip USE TO CHECK BLOOD SUGAR DAILY 100 strip 3   glipiZIDE (GLUCOTROL XL) 5 MG 24 hr tablet TAKE 1 TABLET BY MOUTH TWICE  DAILY 180 tablet 3   ibuprofen (ADVIL) 200 MG tablet Take 445 mg by mouth every 8 (eight) hours as needed for headache.     Insulin Pen Needle (BD PEN NEEDLE NANO U/F) 32G X 4 MM MISC USE ONCE DAILY 90 each 3   JARDIANCE 25 MG TABS tablet TAKE 1 TABLET BY MOUTH DAILY 90 tablet 3   LANTUS SOLOSTAR 100 UNIT/ML Solostar Pen INJECT SUBCUTANEOUSLY 90 UNITS  AT BEDTIME 90 mL 3   lisinopril (ZESTRIL) 20 MG tablet TAKE 1 TABLET BY MOUTH DAILY 90 tablet 3   metFORMIN (GLUCOPHAGE) 1000 MG tablet TAKE 1 TABLET BY MOUTH TWICE  DAILY WITH MEALS 180 tablet 3   pantoprazole (PROTONIX) 40 MG tablet Take 1 tablet (40 mg total) by mouth 2 (two) times daily. 180 tablet 3   Semaglutide, 2 MG/DOSE, (OZEMPIC, 2 MG/DOSE,) 8 MG/3ML SOPN Inject 2 mg into the skin every Saturday.     sildenafil (VIAGRA) 100 MG tablet 1/2 to 1 tab by mouth daily as needed. 10  tablet 11   tamsulosin (FLOMAX) 0.4 MG CAPS capsule Take 1 capsule (0.4 mg total) by mouth daily. 30 capsule 11   Ubrogepant (UBRELVY) 100 MG TABS Take 100 mg by mouth as needed (Take 1 tablet at onset of headache, may repeat in 2 hours if needed, max is 200 mg in 24 hours). 10 tablet 3   No current facility-administered medications on file prior to visit.    Allergies  Allergen Reactions   Bisoprolol-Hydrochlorothiazide Other (See Comments)     E.D. Unknown per Pt   Codeine Itching    Past Medical History:  Diagnosis Date   Allergic rhinitis    Arm fracture, left as a child   DeQuervain's disease (tenosynovitis)    right wrist   Diabetes mellitus    type 2   Erectile dysfunction    GERD (gastroesophageal reflux disease)    Hyperlipidemia    Hypertension    Migraines    Peripheral vascular disease (HCC)    Sleep apnea    uses nightly   Stroke (HCC) 03/21/2022    Past Surgical History:  Procedure Laterality Date   Cardiolite  12/05/2004   Negative EF 66%   CARPAL TUNNEL RELEASE  04/07/2002   left  FRACTURE SURGERY     IR ANGIO INTRA EXTRACRAN SEL COM CAROTID INNOMINATE BILAT MOD SED  03/23/2022   IR ANGIO INTRA EXTRACRAN SEL COM CAROTID INNOMINATE BILAT MOD SED  05/24/2022   IR ANGIO INTRA EXTRACRAN SEL COM CAROTID INNOMINATE BILAT MOD SED  11/28/2022   IR ANGIO INTRA EXTRACRAN SEL COM CAROTID INNOMINATE UNI L MOD SED  04/17/2022   IR ANGIO VERTEBRAL SEL SUBCLAVIAN INNOMINATE BILAT MOD SED  03/23/2022   IR ANGIO VERTEBRAL SEL SUBCLAVIAN INNOMINATE UNI L MOD SED  04/17/2022   IR ANGIO VERTEBRAL SEL SUBCLAVIAN INNOMINATE UNI L MOD SED  05/24/2022   IR ANGIO VERTEBRAL SEL SUBCLAVIAN INNOMINATE UNI L MOD SED  01/10/2023   IR ANGIO VERTEBRAL SEL VERTEBRAL BILAT MOD SED  11/28/2022   IR ANGIO VERTEBRAL SEL VERTEBRAL UNI L MOD SED  03/29/2022   IR ANGIO VERTEBRAL SEL VERTEBRAL UNI R MOD SED  01/10/2023   IR ANGIOGRAM EXTREMITY LEFT  03/29/2022   IR ANGIOGRAM FOLLOW UP STUDY   05/24/2022   IR INTRA CRAN STENT  01/10/2023   IR INTRAVSC STENT CERV CAROTID W/EMB-PROT MOD SED INCL ANGIO  04/17/2022   IR INTRAVSC STENT CERV CAROTID W/EMB-PROT MOD SED INCL ANGIO  05/24/2022   IR TRANSCATH EXCRAN VERT OR CAR A STENT  03/24/2022   IR US GUIDE VASC ACCESS RIGHT  03/23/2022   IR US GUIDE VASC ACCESS RIGHT  03/24/2022   IR US GUIDE VASC ACCESS RIGHT  04/17/2022   IR US GUIDE VASC ACCESS RIGHT  05/24/2022   IR US GUIDE VASC ACCESS RIGHT  11/28/2022   IR US GUIDE VASC ACCESS RIGHT  01/10/2023   RADIOLOGY WITH ANESTHESIA Left 03/24/2022   Procedure: IR WITH ANESTHESIA;  Surgeon: Baldemar Lenis, MD;  Location: [facility] OR;  Service: Radiology;  Laterality: Left;   RADIOLOGY WITH ANESTHESIA N/A 04/17/2022   Procedure: LEFT CAROTID STENT;  Surgeon: Baldemar Lenis, MD;  Location: [facility] OR;  Service: Radiology;  Laterality: N/A;   RADIOLOGY WITH ANESTHESIA N/A 05/24/2022   Procedure: Right carotid angioplasty with possible stenting;  Surgeon: de Glori Luis, MD;  Location: [facility] OR;  Service: Radiology;  Laterality: N/A;   RADIOLOGY WITH ANESTHESIA N/A 01/10/2023   Procedure: Cervical right vertebral artery angioplasty/stenting;  Surgeon: Baldemar Lenis, MD;  Location: [facility] OR;  Service: Radiology;  Laterality: N/A;   WRIST SURGERY  08/07/2002   DeQuervain release   WRIST SURGERY Left 08/07/2006   ORIF l wrist DVR plate screwhead autologous    Family History  Problem Relation Age of Onset   Hypertension Father    Lymphoma Father        non-hodgkins   Uterine cancer Maternal Grandmother    Hypertension Paternal Grandfather    Lung cancer Paternal Aunt    Coronary artery disease Paternal Uncle    Colon cancer Unknown        in 1 relative (?aunt)    Social History   Socioeconomic History   Marital status: Married    Spouse name: Not on file   Number of children: Not on file   Years of education: Not on file   Highest  education level: Bachelor's degree (e.g., BA, AB, BS)  Occupational History   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  Tobacco Use   Smoking status: Never    Passive exposure: Past   Smokeless tobacco: Never  Vaping Use   Vaping  status: Never Used  Substance and Sexual Activity   Alcohol use: Not Currently    Comment: rare   Drug use: No   Sexual activity: Yes    Partners: Female  Other Topics Concern   Not on file  Social History Narrative   Regular exercise: light to moderate   Caffeine use: coffee daily   Social Determinants of Health   Financial Resource Strain: Low Risk  (01/20/2023)   Overall Financial Resource Strain (CARDIA)    Difficulty of Paying Living Expenses: Not hard at all  Food Insecurity: No Food Insecurity (01/20/2023)   Hunger Vital Sign    Worried About Running Out of Food in the Last Year: Never true    Ran Out of Food in the Last Year: Never true  Transportation Needs: No Transportation Needs (01/20/2023)   PRAPARE - Administrator, Civil Service (Medical): No    Lack of Transportation (Non-Medical): No  Physical Activity: Sufficiently Active (01/20/2023)   Exercise Vital Sign    Days of Exercise per Week: 3 days    Minutes of Exercise per Session: 50 min  Stress: No Stress Concern Present (01/20/2023)   [employer] of Occupational Health - Occupational Stress Questionnaire    Feeling of Stress : Only a little  Social Connections: Socially Integrated (01/20/2023)   Social Connection and Isolation Panel [NHANES]    Frequency of Communication with Friends and Family: More than three times a week    Frequency of Social Gatherings with Friends and Family: Once a week    Attends Religious Services: More than 4 times per year    Active Member of [employer] or Organizations: Yes    Attends Engineer, structural: More than 4 times per year    Marital Status: Married  Catering manager Violence: Not At  Risk (01/10/2023)   Humiliation, Afraid, Rape, and Kick questionnaire    Fear of Current or Ex-Partner: No    Emotionally Abused: No    Physically Abused: No    Sexually Abused: No   Review of Systems Done with all the vascular evaluations    Objective:   Physical Exam Constitutional:      Appearance: Normal appearance.  Musculoskeletal:     Comments: Right ankle with green evolving ecchymoses ROM is fine and no bony tenderness  Left great toe without sig swelling New apparent tender small hematoma over distal 4th metatarsal (unclear how this happened in the past 2 days)  Neurological:     Mental Status: He is alert.            Assessment & Plan:

## 2023-04-19 NOTE — Assessment & Plan Note (Signed)
Repeat injury has pushed his recovery back Now with new hematoma on dorsum by 4th metatarsal---unclear how (from shoe??) Can use ice prn Try diclofenac gel

## 2023-05-10 ENCOUNTER — Encounter: Payer: Self-pay | Admitting: Neurology

## 2023-05-10 ENCOUNTER — Ambulatory Visit: Payer: 59 | Admitting: Neurology

## 2023-05-10 VITALS — BP 98/57 | HR 91 | Ht 66.0 in | Wt 176.0 lb

## 2023-05-10 DIAGNOSIS — R42 Dizziness and giddiness: Secondary | ICD-10-CM | POA: Diagnosis not present

## 2023-05-10 DIAGNOSIS — I6503 Occlusion and stenosis of bilateral vertebral arteries: Secondary | ICD-10-CM

## 2023-05-10 NOTE — Patient Instructions (Signed)
I had a long discussion with the patient and his wife regarding his symptoms of vertebrobasilar insufficiency with multi vessel extracranial stenosis subsequent elective bilateral carotid and vertebral origin stenting.  He is doing well except mild subjective dizziness prolonged looking down.  Continue aspirin and Plavix for 2 more months and then discontinue Plavix due to increased bruising and stay on aspirin alone.  Maintain aggressive risk factor modification with strict control of hypertension with blood pressure goal below 130/90, lipids with LDL cholesterol goal below 70 mg percent and diabetes with hemoglobin A1c goal below 6.5%.  He was also encouraged to eat a healthy diet with lots of fruits, vegetables, cereals and whole grains and to be active and exercise regularly.  Patient is neurologically cleared to undergo colonoscopy and may stop his antiplatelet agents 5 days prior to the procedure with a small but acceptable periprocedural risk of TIA/stroke if he is willing.  Return for follow-up in the future in 6 months or call earlier if necessary.  Stroke Prevention Some medical conditions and behaviors can lead to a higher chance of having a stroke. You can help prevent a stroke by eating healthy, exercising, not smoking, and managing any medical conditions you have. Stroke is a leading cause of functional impairment. Primary prevention is particularly important because a majority of strokes are first-time events. Stroke changes the lives of not only those who experience a stroke but also their family and other caregivers. How can this condition affect me? A stroke is a medical emergency and should be treated right away. A stroke can lead to brain damage and can sometimes be life-threatening. If a person gets medical treatment right away, there is a better chance of surviving and recovering from a stroke. What can increase my risk? The following medical conditions may increase your risk of a  stroke: Cardiovascular disease. High blood pressure (hypertension). Diabetes. High cholesterol. Sickle cell disease. Blood clotting disorders (hypercoagulable state). Obesity. Sleep disorders (obstructive sleep apnea). Other risk factors include: Being older than age 49. Having a history of blood clots, stroke, or mini-stroke (transient ischemic attack, TIA). Genetic factors, such as race, ethnicity, or a family history of stroke. Smoking cigarettes or using other tobacco products. Taking birth control pills, especially if you also use tobacco. Heavy use of alcohol or drugs, especially cocaine and methamphetamine. Physical inactivity. What actions can I take to prevent this? Manage your health conditions High cholesterol levels. Eating a healthy diet is important for preventing high cholesterol. If cholesterol cannot be managed through diet alone, you may need to take medicines. Take any prescribed medicines to control your cholesterol as told by your health care provider. Hypertension. To reduce your risk of stroke, try to keep your blood pressure below 130/80. Eating a healthy diet and exercising regularly are important for controlling blood pressure. If these steps are not enough to manage your blood pressure, you may need to take medicines. Take any prescribed medicines to control hypertension as told by your health care provider. Ask your health care provider if you should monitor your blood pressure at home. Have your blood pressure checked every year, even if your blood pressure is normal. Blood pressure increases with age and some medical conditions. Diabetes. Eating a healthy diet and exercising regularly are important parts of managing your blood sugar (glucose). If your blood sugar cannot be managed through diet and exercise, you may need to take medicines. Take any prescribed medicines to control your diabetes as told by your health care  provider. Get evaluated for  obstructive sleep apnea. Talk to your health care provider about getting a sleep evaluation if you snore a lot or have excessive sleepiness. Make sure that any other medical conditions you have, such as atrial fibrillation or atherosclerosis, are managed. Nutrition Follow instructions from your health care provider about what to eat or drink to help manage your health condition. These instructions may include: Reducing your daily calorie intake. Limiting how much salt (sodium) you use to 1,500 milligrams (mg) each day. Using only healthy fats for cooking, such as olive oil, canola oil, or sunflower oil. Eating healthy foods. You can do this by: Choosing foods that are high in fiber, such as whole grains, and fresh fruits and vegetables. Eating at least 5 servings of fruits and vegetables a day. Try to fill one-half of your plate with fruits and vegetables at each meal. Choosing lean protein foods, such as lean cuts of meat, poultry without skin, fish, tofu, beans, and nuts. Eating low-fat dairy products. Avoiding foods that are high in sodium. This can help lower blood pressure. Avoiding foods that have saturated fat, trans fat, and cholesterol. This can help prevent high cholesterol. Avoiding processed and prepared foods. Counting your daily carbohydrate intake.  Lifestyle If you drink alcohol: Limit how much you have to: 0-1 drink a day for women who are not pregnant. 0-2 drinks a day for men. Know how much alcohol is in your drink. In the [place] one drink equals one 12 oz bottle of beer ( ), one 5 oz glass of wine ( ), or one 1 oz glass of hard liquor (44mL). Do not use any products that contain nicotine or tobacco. These products include cigarettes, chewing tobacco, and vaping devices, such as e-cigarettes. If you need help quitting, ask your health care provider. Avoid secondhand smoke. Do not use drugs. Activity  Try to stay at a healthy weight. Get at least 30 minutes of  exercise on most days, such as: Fast walking. Biking. Swimming. Medicines Take over-the-counter and prescription medicines only as told by your health care provider. Aspirin or blood thinners (antiplatelets or anticoagulants) may be recommended to reduce your risk of forming blood clots that can lead to stroke. Avoid taking birth control pills. Talk to your health care provider about the risks of taking birth control pills if: You are over 26 years old. You smoke. You get very bad headaches. You have had a blood clot. Where to find more information American Stroke Association: www.strokeassociation.org Get help right away if: You or a loved one has any symptoms of a stroke. "BE FAST" is an easy way to remember the main warning signs of a stroke: B - Balance. Signs are dizziness, sudden trouble walking, or loss of balance. E - Eyes. Signs are trouble seeing or a sudden change in vision. F - Face. Signs are sudden weakness or numbness of the face, or the face or eyelid drooping on one side. A - Arms. Signs are weakness or numbness in an arm. This happens suddenly and usually on one side of the body. S - Speech. Signs are sudden trouble speaking, slurred speech, or trouble understanding what people say. T - Time. Time to call emergency services. Write down what time symptoms started. You or a loved one has other signs of a stroke, such as: A sudden, severe headache with no known cause. Nausea or vomiting. Seizure. These symptoms may represent a serious problem that is an emergency. Do not wait to see if the  symptoms will go away. Get medical help right away. Call your local emergency services (911 in the [place] Do not drive yourself to the hospital. Summary You can help to prevent a stroke by eating healthy, exercising, not smoking, limiting alcohol intake, and managing any medical conditions you may have. Do not use any products that contain nicotine or tobacco. These include cigarettes,  chewing tobacco, and vaping devices, such as e-cigarettes. If you need help quitting, ask your health care provider. Remember "BE FAST" for warning signs of a stroke. Get help right away if you or a loved one has any of these signs. This information is not intended to replace advice given to you by your health care provider. Make sure you discuss any questions you have with your health care provider. Document Revised: 06/26/2022 Document Reviewed: 06/26/2022 Elsevier Patient Education  2024 [employer].

## 2023-05-10 NOTE — Progress Notes (Signed)
[facility] [address] [place]. [place] [address] [phone]       OFFICE FOLLOW-UP NOTE  Mr. MOMIN PIENKOWSKI Date of Birth:  [birth year] Medical Record Number:  [id number]   HPI: Mr. Nordland is a 62 year old Caucasian male seen today for office follow-up visit.  He is accompanied by his wife.  History is obtained from them and review of electronic medical records.  I personally reviewed pertinent available imaging films in PACS.  He has past medical history of diabetes, hyperlipidemia, hypertension, migraines, sleep apnea.  He initially presented on 03/22/2022 with sudden onset of unsteady gait and dizziness.  He had some vertical vertigo and nausea.  He was brought in for evaluation and CT head was unremarkable but CT angiogram of the head and neck showed severe multifocal stenosis involving bilateral extracranial carotids with string signs and moderate stenosis of left vertebral artery origin.  He underwent left vertebral artery origin stenting on 03/24/2022 by Dr. Sherlon Handing and did well.  He subsequently underwent elective left carotid artery stenting on 04/17/2022 later right ICA stenting on 05/17/2022.  He continued to have intermittent symptoms of dizziness particularly with looking down.  He finally had his right vertebral artery origin angioplasty and stenting on 01/10/2023 by Dr. Sherlon Handing.  Patient has done well since then.  He still has some dizziness but is not as bad.  This is mostly when he is looking down for more than a minute or 2.  This is transient and disappears when he puts his head back.  He denies any slurred speech, extremity weakness, gait or balance difficulties or diplopia or slurred speech.  He is on aspirin and Plavix and tolerating well but bruises quite easily.  Lab work on/8/24 had shown LDL cholesterol to be optimal at 65 mg percent and hemoglobin A1c at 6.4.  He was seen on follow-up on 01/31/23 by Dr. Sherlon Handing plans to see her again in 6 months.  He has  no new complaints today.  He states his tolerating Lipitor well without oxalates and pains and sugars under good control.  His blood pressure is usually on the low side and its only 98 systolic today.  ROS:   14 system review of systems is positive for bruising, dizziness and all other systems negative  PMH:  Past Medical History:  Diagnosis Date   Allergic rhinitis    Arm fracture, left as a child   DeQuervain's disease (tenosynovitis)    right wrist   Diabetes mellitus    type 2   Erectile dysfunction    GERD (gastroesophageal reflux disease)    Hyperlipidemia    Hypertension    Migraines    Peripheral vascular disease (HCC)    Sleep apnea    uses nightly   Stroke (HCC) 03/21/2022    Social History:  Social History   Socioeconomic History   Marital status: Married    Spouse name: Not on file   Number of children: Not on file   Years of education: Not on file   Highest education level: Bachelor's degree (e.g., BA, AB, BS)  Occupational History   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  Tobacco Use   Smoking status: Never    Passive exposure: Past   Smokeless tobacco: Never  Vaping Use   Vaping status: Never Used  Substance and Sexual Activity   Alcohol use: Not Currently    Comment: rare   Drug use: No  Sexual activity: Yes    Partners: Female  Other Topics Concern   Not on file  Social History Narrative   Regular exercise: light to moderate   Caffeine use: coffee daily   Social Determinants of Health   Financial Resource Strain: Low Risk  (01/20/2023)   Overall Financial Resource Strain (CARDIA)    Difficulty of Paying Living Expenses: Not hard at all  Food Insecurity: No Food Insecurity (01/20/2023)   Hunger Vital Sign    Worried About Running Out of Food in the Last Year: Never true    Ran Out of Food in the Last Year: Never true  Transportation Needs: No Transportation Needs (01/20/2023)   PRAPARE - Therapist, art (Medical): No    Lack of Transportation (Non-Medical): No  Physical Activity: Sufficiently Active (01/20/2023)   Exercise Vital Sign    Days of Exercise per Week: 3 days    Minutes of Exercise per Session: 50 min  Stress: No Stress Concern Present (01/20/2023)   [employer] of Occupational Health - Occupational Stress Questionnaire    Feeling of Stress : Only a little  Social Connections: Socially Integrated (01/20/2023)   Social Connection and Isolation Panel [NHANES]    Frequency of Communication with Friends and Family: More than three times a week    Frequency of Social Gatherings with Friends and Family: Once a week    Attends Religious Services: More than 4 times per year    Active Member of [employer] or Organizations: Yes    Attends Engineer, structural: More than 4 times per year    Marital Status: Married  Catering manager Violence: Not At Risk (01/10/2023)   Humiliation, Afraid, Rape, and Kick questionnaire    Fear of Current or Ex-Partner: No    Emotionally Abused: No    Physically Abused: No    Sexually Abused: No    Medications:   Current Outpatient Medications on File Prior to Visit  Medication Sig Dispense Refill   aspirin EC 81 MG tablet Take 81 mg by mouth once.     atorvastatin (LIPITOR) 80 MG tablet TAKE 1 TABLET BY MOUTH DAILY 90 tablet 3   clopidogrel (PLAVIX) 75 MG tablet Take 75 mg by mouth daily.     CONTOUR NEXT TEST test strip USE TO CHECK BLOOD SUGAR DAILY 100 strip 3   glipiZIDE (GLUCOTROL XL) 5 MG 24 hr tablet TAKE 1 TABLET BY MOUTH TWICE  DAILY 180 tablet 3   ibuprofen (ADVIL) 200 MG tablet Take 445 mg by mouth every 8 (eight) hours as needed for headache.     Insulin Pen Needle (BD PEN NEEDLE NANO U/F) 32G X 4 MM MISC USE ONCE DAILY 90 each 3   JARDIANCE 25 MG TABS tablet TAKE 1 TABLET BY MOUTH DAILY 90 tablet 3   LANTUS SOLOSTAR 100 UNIT/ML Solostar Pen INJECT SUBCUTANEOUSLY 90 UNITS  AT BEDTIME 90 mL 3   lisinopril  (ZESTRIL) 20 MG tablet TAKE 1 TABLET BY MOUTH DAILY 90 tablet 3   metFORMIN (GLUCOPHAGE) 1000 MG tablet TAKE 1 TABLET BY MOUTH TWICE  DAILY WITH MEALS 180 tablet 3   pantoprazole (PROTONIX) 40 MG tablet Take 1 tablet (40 mg total) by mouth 2 (two) times daily. 180 tablet 3   Semaglutide, 2 MG/DOSE, (OZEMPIC, 2 MG/DOSE,) 8 MG/3ML SOPN Inject 2 mg into the skin every Saturday.     sildenafil (VIAGRA) 100 MG tablet 1/2 to 1 tab by mouth daily as needed.  10 tablet 11   tamsulosin (FLOMAX) 0.4 MG CAPS capsule Take 1 capsule (0.4 mg total) by mouth daily. 30 capsule 11   Ubrogepant (UBRELVY) 100 MG TABS Take 100 mg by mouth as needed (Take 1 tablet at onset of headache, may repeat in 2 hours if needed, max is 200 mg in 24 hours). 10 tablet 3   No current facility-administered medications on file prior to visit.    Allergies:   Allergies  Allergen Reactions   Bisoprolol-Hydrochlorothiazide Other (See Comments)     E.D. Unknown per Pt   Codeine Itching    Physical Exam General: well developed, well nourished, seated, in no evident distress Head: head normocephalic and atraumatic.  Neck: supple with no carotid or supraclavicular bruits Cardiovascular: regular rate and rhythm, no murmurs Musculoskeletal: no deformity Skin:  no rash but multiple scattered petechiae and forearms bilaterally. Vascular:  Normal pulses all extremities Vitals:   05/10/23 1415  BP: (!) 98/57  Pulse: 91   Neurologic Exam Mental Status: Awake and fully alert. Oriented to place and time. Recent and remote memory intact. Attention span, concentration and fund of knowledge appropriate. Mood and affect appropriate.  Cranial Nerves: Fundoscopic exam reveals sharp disc margins. Pupils equal, briskly reactive to light. Extraocular movements full without nystagmus. Visual fields full to confrontation. Hearing intact. Facial sensation intact. Face, tongue, palate moves normally and symmetrically.  Motor: Normal bulk and  tone. Normal strength in all tested extremity muscles. Sensory.: intact to touch ,pinprick .position and vibratory sensation.  Coordination: Rapid alternating movements normal in all extremities. Finger-to-nose and heel-to-shin performed accurately bilaterally. Gait and Station: Arises from chair without difficulty. Stance is normal. Gait demonstrates normal stride length and balance . Able to heel, toe and tandem walk without difficulty.  Reflexes: 1+ and symmetric. Toes downgoing.   NIHSS  0 Modified Rankin  1   ASSESSMENT: 62 year old Caucasian male with episodes of vertebrobasilar insufficiency due to severe bilateral proximal vertebral origin stenosis as well as severe high-grade bilateral carotid stenosis status post bilateral elective carotid stents as well as vertebral origin splints the last 1 being the right vertebral artery 01/10/2023.  Patient is doing well but does have some subjective dizziness complaints with prolonged bending of the neck down.  Multiple vascular risk factors of diabetes, hypertension, hyperlipidemia, sleep apnea, multivessel extracranial stenosis status post angioplasty stenting     PLAN:I had a long discussion with the patient and his wife regarding his symptoms of vertebrobasilar insufficiency with multi vessel extracranial stenosis subsequent elective bilateral carotid and vertebral origin stenting.  He is doing well except mild subjective dizziness prolonged looking down.  Continue aspirin and Plavix for 2 more months and then discontinue Plavix due to increased bruising and stay on aspirin alone.  Maintain aggressive risk factor modification with strict control of hypertension with blood pressure goal below 130/90, lipids with LDL cholesterol goal below 70 mg percent and diabetes with hemoglobin A1c goal below 6.5%.  He was also encouraged to eat a healthy diet with lots of fruits, vegetables, cereals and whole grains and to be active and exercise regularly.   Patient is neurologically cleared to undergo colonoscopy and may stop his antiplatelet agents 5 days prior to the procedure with a small but acceptable periprocedural risk of TIA/stroke if he is willing.  Return for follow-up in the future in 6 months or call earlier if necessary. Greater than 50% of time during this 35 minute visit was spent on counseling,explanation of diagnosis of vertebrobasilar TIAs,  symptomatic vertebral and carotid stenosis and stenting, planning of further management, discussion with patient and family and coordination of care Delia Heady, MD Note: This document was prepared with digital dictation and possible smart phrase technology. Any transcriptional errors that result from this process are unintentional

## 2023-05-11 ENCOUNTER — Other Ambulatory Visit: Payer: Self-pay | Admitting: Internal Medicine

## 2023-05-11 DIAGNOSIS — Z1212 Encounter for screening for malignant neoplasm of rectum: Secondary | ICD-10-CM

## 2023-05-11 DIAGNOSIS — Z1211 Encounter for screening for malignant neoplasm of colon: Secondary | ICD-10-CM

## 2023-05-22 ENCOUNTER — Other Ambulatory Visit: Payer: Self-pay | Admitting: Internal Medicine

## 2023-05-22 NOTE — Telephone Encounter (Signed)
Last refill: Listed as Historical provider  Last OV: 04/19/23 (Acute)    Next OV: 11/15/23

## 2023-05-30 LAB — COLOGUARD: COLOGUARD: NEGATIVE

## 2023-06-13 ENCOUNTER — Other Ambulatory Visit: Payer: Self-pay | Admitting: Internal Medicine

## 2023-06-26 ENCOUNTER — Other Ambulatory Visit: Payer: Self-pay | Admitting: Internal Medicine

## 2023-06-27 ENCOUNTER — Other Ambulatory Visit: Payer: Self-pay | Admitting: Internal Medicine

## 2023-08-06 ENCOUNTER — Other Ambulatory Visit: Payer: Self-pay | Admitting: Neurology

## 2023-08-16 ENCOUNTER — Other Ambulatory Visit (HOSPITAL_COMMUNITY): Payer: Self-pay | Admitting: Neuroradiology

## 2023-08-16 DIAGNOSIS — G450 Vertebro-basilar artery syndrome: Secondary | ICD-10-CM

## 2023-08-20 ENCOUNTER — Ambulatory Visit (HOSPITAL_COMMUNITY)
Admission: RE | Admit: 2023-08-20 | Discharge: 2023-08-20 | Disposition: A | Discharge status: Home / Self Care | Payer: 59 | Source: Ambulatory Visit | Attending: Neuroradiology | Admitting: Neuroradiology

## 2023-08-20 DIAGNOSIS — G450 Vertebro-basilar artery syndrome: Secondary | ICD-10-CM | POA: Diagnosis present

## 2023-08-20 MED ORDER — GADOBUTROL 1 MMOL/ML IV SOLN
8.0000 mL | Freq: Once | INTRAVENOUS | Status: AC | PRN
  Administered 2023-08-20: 8 mL via INTRAVENOUS

## 2023-08-27 ENCOUNTER — Other Ambulatory Visit (HOSPITAL_COMMUNITY): Payer: Self-pay | Admitting: Neuroradiology

## 2023-08-27 DIAGNOSIS — G450 Vertebro-basilar artery syndrome: Secondary | ICD-10-CM

## 2023-08-28 ENCOUNTER — Ambulatory Visit (HOSPITAL_COMMUNITY)
Admission: RE | Admit: 2023-08-28 | Discharge: 2023-08-28 | Disposition: A | Discharge status: Home / Self Care | Payer: 59 | Source: Ambulatory Visit | Attending: Neuroradiology | Admitting: Neuroradiology

## 2023-08-28 DIAGNOSIS — G450 Vertebro-basilar artery syndrome: Secondary | ICD-10-CM

## 2023-08-30 ENCOUNTER — Other Ambulatory Visit (HOSPITAL_COMMUNITY): Payer: Self-pay | Admitting: Neuroradiology

## 2023-08-30 MED ORDER — ASPIRIN 325 MG PO TBEC: 325.0000 mg | DELAYED_RELEASE_TABLET | Freq: Every day | ORAL | 0 refills | Status: AC

## 2023-08-30 NOTE — Progress Notes (Signed)
Referring Physician(s): de Macedo Rodrigues,Quiera Diffee  History of present illness:  63 year old male with past medical history significant for diabetes, hypertension, hyperlipidemia, migraines, sleep apnea and TIA. He has significant atherosclerotic disease of the major neck arteries status post left vertebral artery and bilateral carotid arteries stenting. Most recently, he developed lightheadedness, dizziness and headache when moving head up and down and to the left side. Cerebral angiogram showed decreased flow through the left vertebral artery stent when turning the head down to the left. On 01/10/2023, he underwent elective angioplasty and stenting of a severely stenotic right vertebral artery origin. He has been doing well except for lightheadedness when looking down for over a couple of minutes. No focal neurological deficit. He comes today to discuss results of recent MRA of the neck. He says he felt unwell after the MRI for several days when he fell generalized weakness and appeared very pale.   Past Medical History:  Diagnosis Date   Allergic rhinitis    Arm fracture, left as a child   DeQuervain's disease (tenosynovitis)    right wrist   Diabetes mellitus    type 2   Erectile dysfunction    GERD (gastroesophageal reflux disease)    Hyperlipidemia    Hypertension    Migraines    Peripheral vascular disease (HCC)    Sleep apnea    uses nightly   Stroke (HCC) 03/21/2022    Past Surgical History:  Procedure Laterality Date   Cardiolite  12/05/2004   Negative EF 66%   CARPAL TUNNEL RELEASE  04/07/2002   left    FRACTURE SURGERY     IR ANGIO INTRA EXTRACRAN SEL COM CAROTID INNOMINATE BILAT MOD SED  03/23/2022   IR ANGIO INTRA EXTRACRAN SEL COM CAROTID INNOMINATE BILAT MOD SED  05/24/2022   IR ANGIO INTRA EXTRACRAN SEL COM CAROTID INNOMINATE BILAT MOD SED  11/28/2022   IR ANGIO INTRA EXTRACRAN SEL COM CAROTID INNOMINATE UNI L MOD SED  04/17/2022   IR ANGIO VERTEBRAL SEL  SUBCLAVIAN INNOMINATE BILAT MOD SED  03/23/2022   IR ANGIO VERTEBRAL SEL SUBCLAVIAN INNOMINATE UNI L MOD SED  04/17/2022   IR ANGIO VERTEBRAL SEL SUBCLAVIAN INNOMINATE UNI L MOD SED  05/24/2022   IR ANGIO VERTEBRAL SEL SUBCLAVIAN INNOMINATE UNI L MOD SED  01/10/2023   IR ANGIO VERTEBRAL SEL VERTEBRAL BILAT MOD SED  11/28/2022   IR ANGIO VERTEBRAL SEL VERTEBRAL UNI L MOD SED  03/29/2022   IR ANGIO VERTEBRAL SEL VERTEBRAL UNI R MOD SED  01/10/2023   IR ANGIOGRAM EXTREMITY LEFT  03/29/2022   IR ANGIOGRAM FOLLOW UP STUDY  05/24/2022   IR INTRA CRAN STENT  01/10/2023   IR INTRAVSC STENT CERV CAROTID W/EMB-PROT MOD SED INCL ANGIO  04/17/2022   IR INTRAVSC STENT CERV CAROTID W/EMB-PROT MOD SED INCL ANGIO  05/24/2022   IR TRANSCATH EXCRAN VERT OR CAR A STENT  03/24/2022   IR US GUIDE VASC ACCESS RIGHT  03/23/2022   IR US GUIDE VASC ACCESS RIGHT  03/24/2022   IR US GUIDE VASC ACCESS RIGHT  04/17/2022   IR US GUIDE VASC ACCESS RIGHT  05/24/2022   IR US GUIDE VASC ACCESS RIGHT  11/28/2022   IR US GUIDE VASC ACCESS RIGHT  01/10/2023   RADIOLOGY WITH ANESTHESIA Left 03/24/2022   Procedure: IR WITH ANESTHESIA;  Surgeon: Baldemar Lenis, MD;  Location: [facility] OR;  Service: Radiology;  Laterality: Left;   RADIOLOGY WITH ANESTHESIA N/A 04/17/2022   Procedure: LEFT CAROTID STENT;  Surgeon: Baldemar Lenis, MD;  Location: [facility] OR;  Service: Radiology;  Laterality: N/A;   RADIOLOGY WITH ANESTHESIA N/A 05/24/2022   Procedure: Right carotid angioplasty with possible stenting;  Surgeon: de Glori Luis, MD;  Location: [facility] OR;  Service: Radiology;  Laterality: N/A;   RADIOLOGY WITH ANESTHESIA N/A 01/10/2023   Procedure: Cervical right vertebral artery angioplasty/stenting;  Surgeon: Baldemar Lenis, MD;  Location: [facility] OR;  Service: Radiology;  Laterality: N/A;   WRIST SURGERY  08/07/2002   DeQuervain release   WRIST SURGERY Left 08/07/2006   ORIF l wrist DVR plate  screwhead autologous    Allergies: Bisoprolol-hydrochlorothiazide and Codeine  Medications: Prior to Admission medications   Medication Sig Start Date End Date Taking? Authorizing Provider  aspirin EC 81 MG tablet Take 81 mg by mouth once.    [provider]  atorvastatin (LIPITOR) 80 MG tablet TAKE 1 TABLET BY MOUTH DAILY 07/10/22   Tillman Abide I, MD  clopidogrel (PLAVIX) 75 MG tablet TAKE 1 TABLET BY MOUTH DAILY 05/22/23   Karie Schwalbe, MD  CONTOUR NEXT TEST test strip USE TO CHECK BLOOD SUGAR DAILY 11/20/22   Tillman Abide I, MD  glipiZIDE (GLUCOTROL XL) 5 MG 24 hr tablet TAKE 1 TABLET BY MOUTH TWICE  DAILY 01/08/23   Tillman Abide I, MD  ibuprofen (ADVIL) 200 MG tablet Take 445 mg by mouth every 8 (eight) hours as needed for headache.    [provider]  Insulin Pen Needle (BD PEN NEEDLE NANO U/F) 32G X 4 MM MISC USE ONCE DAILY 09/01/22   Karie Schwalbe, MD  JARDIANCE 25 MG TABS tablet TAKE 1 TABLET BY MOUTH DAILY 06/27/23   Karie Schwalbe, MD  LANTUS SOLOSTAR 100 UNIT/ML Solostar Pen INJECT SUBCUTANEOUSLY 90 UNITS  AT BEDTIME 03/13/23   Tillman Abide I, MD  lisinopril (ZESTRIL) 20 MG tablet TAKE 1 TABLET BY MOUTH DAILY 06/27/23   Tillman Abide I, MD  metFORMIN (GLUCOPHAGE) 1000 MG tablet TAKE 1 TABLET BY MOUTH TWICE  DAILY WITH MEALS 09/01/22   Tillman Abide I, MD  pantoprazole (PROTONIX) 40 MG tablet TAKE ONE TABLET BY MOUTH TWICE DAILY 06/13/23   Karie Schwalbe, MD  Semaglutide, 2 MG/DOSE, (OZEMPIC, 2 MG/DOSE,) 8 MG/3ML SOPN Inject 2 mg into the skin every Saturday.    [provider]  sildenafil (VIAGRA) 100 MG tablet 1/2 to 1 tab by mouth daily as needed. 10/18/22   Karie Schwalbe, MD  tamsulosin (FLOMAX) 0.4 MG CAPS capsule Take 1 capsule (0.4 mg total) by mouth daily. 11/13/22   Karie Schwalbe, MD  UBRELVY 100 MG TABS TAKE 1 TABLET BY MOUTH AT ONSET OF HEADACHE, MAY REPEAT IN 2 HOURS IF NEEDED. MAX OF 2 TABS/24 HOURS 08/06/23    Glean Salvo, NP     Family History  Problem Relation Age of Onset   Hypertension Father    Lymphoma Father        non-hodgkins   Uterine cancer Maternal Grandmother    Hypertension Paternal Grandfather    Lung cancer Paternal Aunt    Coronary artery disease Paternal Uncle    Colon cancer Unknown        in 1 relative (?aunt)    Social History   Socioeconomic History   Marital status: Married    Spouse name: Not on file   Number of children: Not on file   Years of education: Not on file   Highest education level:  Bachelor's degree (e.g., BA, AB, BS)  Occupational History   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  Tobacco Use   Smoking status: Never    Passive exposure: Past   Smokeless tobacco: Never  Vaping Use   Vaping status: Never Used  Substance and Sexual Activity   Alcohol use: Not Currently    Comment: rare   Drug use: No   Sexual activity: Yes    Partners: Female  Other Topics Concern   Not on file  Social History Narrative   Regular exercise: light to moderate   Caffeine use: coffee daily   Social Drivers of Corporate investment banker Strain: Low Risk  (01/20/2023)   Overall Financial Resource Strain (CARDIA)    Difficulty of Paying Living Expenses: Not hard at all  Food Insecurity: No Food Insecurity (01/20/2023)   Hunger Vital Sign    Worried About Running Out of Food in the Last Year: Never true    Ran Out of Food in the Last Year: Never true  Transportation Needs: No Transportation Needs (01/20/2023)   PRAPARE - Administrator, Civil Service (Medical): No    Lack of Transportation (Non-Medical): No  Physical Activity: Sufficiently Active (01/20/2023)   Exercise Vital Sign    Days of Exercise per Week: 3 days    Minutes of Exercise per Session: 50 min  Stress: No Stress Concern Present (01/20/2023)   [employer] of Occupational Health - Occupational Stress Questionnaire    Feeling of Stress :  Only a little  Social Connections: Socially Integrated (01/20/2023)   Social Connection and Isolation Panel [NHANES]    Frequency of Communication with Friends and Family: More than three times a week    Frequency of Social Gatherings with Friends and Family: Once a week    Attends Religious Services: More than 4 times per year    Active Member of [employer] or Organizations: Yes    Attends Engineer, structural: More than 4 times per year    Marital Status: Married     Vital Signs: There were no vitals taken for this visit.  Physical Exam Constitutional:      Appearance: Normal appearance.  Neurological:     General: No focal deficit present.     Mental Status: He is alert and oriented to person, place, and time. Mental status is at baseline.     Imaging:  EXAM: MRA NECK WITHOUT AND WITH CONTRAST   TECHNIQUE: Multiplanar and multiecho pulse sequences of the neck were obtained without and with intravenous contrast. Angiographic images of the neck were obtained using MRA technique without and with intravenous contrast.   CONTRAST:  8mL GADAVIST GADOBUTROL 1 MMOL/ML IV SOLN   COMPARISON:  CTA head and neck 11/15/2022   FINDINGS: There is a standard 3 vessel aortic arch. The brachiocephalic and subclavian arteries are patent without evidence of a significant stenosis.   Both common carotid arteries are widely patent. There is signal loss at the carotid bifurcations and proximal internal carotid arteries secondary to bilateral carotid stents which precludes assessment of these regions, however both internal carotid arteries are widely patent more distally in the neck.   There is artifactual signal loss in the proximal V1 segments bilaterally related to stents which precludes assessment of these regions. Both vertebral arteries are widely patent more distally in the neck with antegrade flow bilaterally. The left vertebral artery is dominant. A moderate right V4  stenosis is likely unchanged.   IMPRESSION: 1. Artifact from stents obscures the proximal vertebral arteries and proximal internal carotid arteries. 2. Wide patency of the vertebral and carotid arteries elsewhere in the neck. 3. Unchanged moderate right V4 stenosis.     Electronically Signed   By: Sebastian Ache M.D.   On: 08/27/2023 14:16    Labs:  CBC: Recent Labs    11/13/22 1055 11/28/22 0646 01/10/23 0643 01/11/23 0955  WBC 7.6 7.1 7.7 11.1*  HGB 15.4 15.4 16.4 14.2  HCT 44.0 43.0 48.0 40.6  PLT 246 234 249 241    COAGS: Recent Labs    11/28/22 0646 01/10/23 0643  INR 1.1 1.1    BMP: Recent Labs    11/13/22 1055 11/28/22 0646 01/10/23 0643 01/11/23 0955  NA 136 135 133* 131*  K 4.2 4.0 4.5 3.8  CL 100 100 97* 99  CO2 21 21* 25 21*  GLUCOSE 69* 84 100* 216*  BUN 14 16 15 18   CALCIUM 9.3 9.1 9.5 8.5*  CREATININE 1.20 1.19 1.36* 1.26*  GFRNONAA  --  >60 59* >60    LIVER FUNCTION TESTS: Recent Labs    11/13/22 1055 01/11/23 0955  BILITOT 0.8 1.1  AST 25 32  ALT 29 24  ALKPHOS 73 64  PROT 6.8 6.3*  ALBUMIN 4.3 3.5    Assessment:  I discussed imaging findings with Mr. Obrochta and his wife. While artifact prevents visualization of the inner lumen of the vertebral stents, the flow appear normal right distal to the stents, consistent with maintained patency. I explained that to have a good visualization of the lumen of the stent, a diagnostic cerebral angiogram would need to be performed. However, since he has not developed any new focal neurological deficit, the stents are likely sufficiently patent. We discussed transitioning to single antiplatelet agent. They asked appropriate questions. At this time, Mr. Corkill favors changing to single anti-platelet agent without performing a catheter angiogram first. He will transition to aspirin 325 mg daily and stop the plavix. He was instructed to contact us should he change his mind about the angiogram or if  he develops new symptoms.  Signed: Baldemar Lenis, MD 08/30/2023, 3:34 PM    I spent a total of  25 Minutes in face to face in clinical consultation, greater than 50% of which was counseling/coordinating care for cerebrovascula atherosclerotic disease.

## 2023-09-03 ENCOUNTER — Other Ambulatory Visit: Payer: Self-pay | Admitting: Internal Medicine

## 2023-09-12 ENCOUNTER — Encounter: Payer: Self-pay | Admitting: Neurology

## 2023-09-12 MED ORDER — UBRELVY 100 MG PO TABS: ORAL_TABLET | ORAL | 3 refills | Status: AC

## 2023-09-14 ENCOUNTER — Telehealth: Payer: Self-pay | Admitting: Pharmacy Technician

## 2023-09-14 ENCOUNTER — Other Ambulatory Visit (HOSPITAL_COMMUNITY): Payer: Self-pay

## 2023-09-14 NOTE — Telephone Encounter (Signed)
 Pharmacy Patient Advocate Encounter   Received notification from CoverMyMeds that prior authorization for Ubrelvy  100MG  tablets is required/requested.   Insurance verification completed.   The patient is insured through [facility] .   Per test claim: PA required; PA submitted to above mentioned insurance via CoverMyMeds Key/confirmation #/EOC [id number] Status is pending

## 2023-09-20 NOTE — Telephone Encounter (Signed)
Pharmacy Patient Advocate Encounter  Received notification from [facility] that Prior Authorization for Ubrelvy 100MG  tablets  has been APPROVED from 09/14/2023 to 09/13/2024   PA #/Case ID/Reference #: [id number]

## 2023-10-01 ENCOUNTER — Other Ambulatory Visit: Payer: Self-pay | Admitting: Internal Medicine

## 2023-10-09 ENCOUNTER — Other Ambulatory Visit: Payer: Self-pay | Admitting: Internal Medicine

## 2023-11-15 ENCOUNTER — Ambulatory Visit (INDEPENDENT_AMBULATORY_CARE_PROVIDER_SITE_OTHER): Payer: 59 | Admitting: Internal Medicine

## 2023-11-15 ENCOUNTER — Encounter: Payer: Self-pay | Admitting: Internal Medicine

## 2023-11-15 VITALS — BP 110/68 | HR 84 | Temp 98.6°F | Ht 66.5 in | Wt 177.0 lb

## 2023-11-15 DIAGNOSIS — I672 Cerebral atherosclerosis: Secondary | ICD-10-CM

## 2023-11-15 DIAGNOSIS — E1159 Type 2 diabetes mellitus with other circulatory complications: Secondary | ICD-10-CM | POA: Diagnosis not present

## 2023-11-15 DIAGNOSIS — E119 Type 2 diabetes mellitus without complications: Secondary | ICD-10-CM | POA: Diagnosis not present

## 2023-11-15 DIAGNOSIS — E291 Testicular hypofunction: Secondary | ICD-10-CM

## 2023-11-15 DIAGNOSIS — Z7984 Long term (current) use of oral hypoglycemic drugs: Secondary | ICD-10-CM | POA: Insufficient documentation

## 2023-11-15 DIAGNOSIS — Z0000 Encounter for general adult medical examination without abnormal findings: Secondary | ICD-10-CM

## 2023-11-15 DIAGNOSIS — Z794 Long term (current) use of insulin: Secondary | ICD-10-CM

## 2023-11-15 DIAGNOSIS — Z1159 Encounter for screening for other viral diseases: Secondary | ICD-10-CM | POA: Diagnosis not present

## 2023-11-15 DIAGNOSIS — Z125 Encounter for screening for malignant neoplasm of prostate: Secondary | ICD-10-CM

## 2023-11-15 DIAGNOSIS — I10 Essential (primary) hypertension: Secondary | ICD-10-CM

## 2023-11-15 DIAGNOSIS — Z7985 Long-term (current) use of injectable non-insulin antidiabetic drugs: Secondary | ICD-10-CM

## 2023-11-15 LAB — HM DIABETES FOOT EXAM

## 2023-11-15 MED ORDER — TADALAFIL 5 MG PO TABS
5.0000 mg | ORAL_TABLET | Freq: Every day | ORAL | 11 refills | Status: AC
Start: 2023-11-15 — End: ?

## 2023-11-15 NOTE — Progress Notes (Signed)
 Subjective:    Patient ID: Calvin Smith, male    DOB: 01-03-1961, 63 y.o.   MRN: [id number]  HPI Here for physical With wife  Done with vascular procedures Some dizzy spells with change in head position Slight word finding issues  Still has foot pain on left---since fracture of great toe Wakes him up at times  Sildenafil not working any more Cialis in the distant past--- doesn't remember how it was  Wife heard skipped beats last night--when head on his chest  No palpitations Walks about 4-5 miles a day---normal activities  Recent A1c 7.6% Bad stretch with sugar during the winter--working on this with Dr Gershon Crane Trying to get CGM  Needs to have trigger finger surgery---injections not helping  Current Outpatient Medications on File Prior to Visit  Medication Sig Dispense Refill   aspirin EC 325 MG tablet Take 1 tablet (325 mg total) by mouth daily. 30 tablet 0   atorvastatin (LIPITOR) 80 MG tablet TAKE 1 TABLET BY MOUTH DAILY 90 tablet 3   CONTOUR NEXT TEST test strip USE TO CHECK BLOOD SUGAR DAILY 100 strip 3   glipiZIDE (GLUCOTROL XL) 5 MG 24 hr tablet TAKE 1 TABLET BY MOUTH TWICE  DAILY 180 tablet 3   ibuprofen (ADVIL) 200 MG tablet Take 445 mg by mouth every 8 (eight) hours as needed for headache.     Insulin Pen Needle (BD PEN NEEDLE NANO U/F) 32G X 4 MM MISC USE ONCE DAILY 90 each 3   JARDIANCE 25 MG TABS tablet TAKE 1 TABLET BY MOUTH DAILY 90 tablet 3   LANTUS SOLOSTAR 100 UNIT/ML Solostar Pen INJECT SUBCUTANEOUSLY 90 UNITS  AT BEDTIME 90 mL 3   lisinopril (ZESTRIL) 20 MG tablet TAKE 1 TABLET BY MOUTH DAILY 90 tablet 3   metFORMIN (GLUCOPHAGE) 1000 MG tablet TAKE 1 TABLET BY MOUTH TWICE  DAILY WITH MEALS 180 tablet 3   pantoprazole (PROTONIX) 40 MG tablet TAKE ONE TABLET BY MOUTH TWICE DAILY 180 tablet 3   Semaglutide, 2 MG/DOSE, (OZEMPIC, 2 MG/DOSE,) 8 MG/3ML SOPN Inject 2 mg into the skin every Saturday.     sildenafil (VIAGRA) 100 MG tablet 1/2 to 1 tab by  mouth daily as needed. 10 tablet 11   tamsulosin (FLOMAX) 0.4 MG CAPS capsule TAKE 1 CAPSULE BY MOUTH ONCE DAILY 90 capsule 3   Ubrogepant (UBRELVY) 100 MG TABS TAKE 1 TABLET BY MOUTH AT ONSET OF HEADACHE, MAY REPEAT IN 2 HOURS IF NEEDED. MAX OF 2 TABS/24 HOURS 30 tablet 3   No current facility-administered medications on file prior to visit.    Allergies  Allergen Reactions   Bisoprolol-Hydrochlorothiazide Other (See Comments)     E.D. Unknown per Pt   Codeine Itching    Past Medical History:  Diagnosis Date   Allergic rhinitis    Arm fracture, left as a child   DeQuervain's disease (tenosynovitis)    right wrist   Diabetes mellitus    type 2   Erectile dysfunction    GERD (gastroesophageal reflux disease)    Hyperlipidemia    Hypertension    Migraines    Peripheral vascular disease (HCC)    Sleep apnea    uses nightly   Stroke (HCC) 03/21/2022    Past Surgical History:  Procedure Laterality Date   Cardiolite  12/05/2004   Negative EF 66%   CARPAL TUNNEL RELEASE  04/07/2002   left    FRACTURE SURGERY     IR ANGIO INTRA EXTRACRAN SEL  COM CAROTID INNOMINATE BILAT MOD SED  03/23/2022   IR ANGIO INTRA EXTRACRAN SEL COM CAROTID INNOMINATE BILAT MOD SED  05/24/2022   IR ANGIO INTRA EXTRACRAN SEL COM CAROTID INNOMINATE BILAT MOD SED  11/28/2022   IR ANGIO INTRA EXTRACRAN SEL COM CAROTID INNOMINATE UNI L MOD SED  04/17/2022   IR ANGIO VERTEBRAL SEL SUBCLAVIAN INNOMINATE BILAT MOD SED  03/23/2022   IR ANGIO VERTEBRAL SEL SUBCLAVIAN INNOMINATE UNI L MOD SED  04/17/2022   IR ANGIO VERTEBRAL SEL SUBCLAVIAN INNOMINATE UNI L MOD SED  05/24/2022   IR ANGIO VERTEBRAL SEL SUBCLAVIAN INNOMINATE UNI L MOD SED  01/10/2023   IR ANGIO VERTEBRAL SEL VERTEBRAL BILAT MOD SED  11/28/2022   IR ANGIO VERTEBRAL SEL VERTEBRAL UNI L MOD SED  03/29/2022   IR ANGIO VERTEBRAL SEL VERTEBRAL UNI R MOD SED  01/10/2023   IR ANGIOGRAM EXTREMITY LEFT  03/29/2022   IR ANGIOGRAM FOLLOW UP STUDY  05/24/2022    IR INTRA CRAN STENT  01/10/2023   IR INTRAVSC STENT CERV CAROTID W/EMB-PROT MOD SED INCL ANGIO  04/17/2022   IR INTRAVSC STENT CERV CAROTID W/EMB-PROT MOD SED INCL ANGIO  05/24/2022   IR TRANSCATH EXCRAN VERT OR CAR A STENT  03/24/2022   IR US GUIDE VASC ACCESS RIGHT  03/23/2022   IR US GUIDE VASC ACCESS RIGHT  03/24/2022   IR US GUIDE VASC ACCESS RIGHT  04/17/2022   IR US GUIDE VASC ACCESS RIGHT  05/24/2022   IR US GUIDE VASC ACCESS RIGHT  11/28/2022   IR US GUIDE VASC ACCESS RIGHT  01/10/2023   RADIOLOGY WITH ANESTHESIA Left 03/24/2022   Procedure: IR WITH ANESTHESIA;  Surgeon: Baldemar Lenis, MD;  Location: [facility] OR;  Service: Radiology;  Laterality: Left;   RADIOLOGY WITH ANESTHESIA N/A 04/17/2022   Procedure: LEFT CAROTID STENT;  Surgeon: Baldemar Lenis, MD;  Location: [facility] OR;  Service: Radiology;  Laterality: N/A;   RADIOLOGY WITH ANESTHESIA N/A 05/24/2022   Procedure: Right carotid angioplasty with possible stenting;  Surgeon: de Glori Luis, MD;  Location: [facility] OR;  Service: Radiology;  Laterality: N/A;   RADIOLOGY WITH ANESTHESIA N/A 01/10/2023   Procedure: Cervical right vertebral artery angioplasty/stenting;  Surgeon: Baldemar Lenis, MD;  Location: [facility] OR;  Service: Radiology;  Laterality: N/A;   WRIST SURGERY  08/07/2002   DeQuervain release   WRIST SURGERY Left 08/07/2006   ORIF l wrist DVR plate screwhead autologous    Family History  Problem Relation Age of Onset   Hypertension Father    Lymphoma Father        non-hodgkins   Uterine cancer Maternal Grandmother    Hypertension Paternal Grandfather    Lung cancer Paternal Aunt    Coronary artery disease Paternal Uncle    Colon cancer Unknown        in 1 relative (?aunt)    Social History   Socioeconomic History   Marital status: Married    Spouse name: Not on file   Number of children: Not on file   Years of education: Not on file   Highest education level:  Bachelor's degree (e.g., BA, AB, BS)  Occupational History   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  Tobacco Use   Smoking status: Never    Passive exposure: Past   Smokeless tobacco: Never  Vaping Use   Vaping status: Never Used  Substance and Sexual Activity   Alcohol  use: Not Currently    Comment: rare   Drug use: No   Sexual activity: Yes    Partners: Female  Other Topics Concern   Not on file  Social History Narrative   Regular exercise: light to moderate   Caffeine use: coffee daily   Social Drivers of Corporate investment banker Strain: Low Risk  (01/20/2023)   Overall Financial Resource Strain (CARDIA)    Difficulty of Paying Living Expenses: Not hard at all  Food Insecurity: No Food Insecurity (01/20/2023)   Hunger Vital Sign    Worried About Running Out of Food in the Last Year: Never true    Ran Out of Food in the Last Year: Never true  Transportation Needs: No Transportation Needs (01/20/2023)   PRAPARE - Administrator, Civil Service (Medical): No    Lack of Transportation (Non-Medical): No  Physical Activity: Sufficiently Active (01/20/2023)   Exercise Vital Sign    Days of Exercise per Week: 3 days    Minutes of Exercise per Session: 50 min  Stress: No Stress Concern Present (01/20/2023)   [employer] of Occupational Health - Occupational Stress Questionnaire    Feeling of Stress : Only a little  Social Connections: Socially Integrated (01/20/2023)   Social Connection and Isolation Panel [NHANES]    Frequency of Communication with Friends and Family: More than three times a week    Frequency of Social Gatherings with Friends and Family: Once a week    Attends Religious Services: More than 4 times per year    Active Member of [employer] or Organizations: Yes    Attends Engineer, structural: More than 4 times per year    Marital Status: Married  Catering manager Violence: Not At Risk (01/10/2023)    Humiliation, Afraid, Rape, and Kick questionnaire    Fear of Current or Ex-Partner: No    Emotionally Abused: No    Physically Abused: No    Sexually Abused: No    Review of Systems  Constitutional:  Negative for fatigue and unexpected weight change.       Wears seat belt  HENT:  Negative for dental problem, hearing loss, tinnitus and trouble swallowing.        Keeps up with dentist  Eyes:  Negative for visual disturbance.       No diplopia or unilateral vision loss  Respiratory:  Negative for cough, chest tightness and shortness of breath.   Cardiovascular:  Negative for chest pain, palpitations and leg swelling.  Gastrointestinal:        Brief spell of constipation after starting supplements--better now Heartburn better with bid pantoprazole---doesn't wake with reflux (still acid burps)  Endocrine: Negative for polydipsia and polyuria.  Genitourinary:  Positive for difficulty urinating and frequency. Negative for urgency.       Still slow despite the tamsulosin  Musculoskeletal:  Positive for arthralgias. Negative for back pain and joint swelling.       Still with some left hip pain  Skin:  Negative for rash.       Sees derm  Allergic/Immunologic: Positive for environmental allergies. Negative for immunocompromised state.       Mild--no meds  Neurological:  Positive for dizziness and headaches. Negative for syncope.  Hematological:  Negative for adenopathy. Bruises/bleeds easily.  Psychiatric/Behavioral:  Positive for sleep disturbance. Negative for dysphoric mood. The patient is not nervous/anxious.        Objective:   Physical Exam Constitutional:  Appearance: Normal appearance.  HENT:     Mouth/Throat:     Pharynx: No oropharyngeal exudate or posterior oropharyngeal erythema.  Eyes:     Conjunctiva/sclera: Conjunctivae normal.     Pupils: Pupils are equal, round, and reactive to light.  Cardiovascular:     Rate and Rhythm: Normal rate and regular rhythm.      Pulses: Normal pulses.     Heart sounds: No murmur heard.    No gallop.     Comments: Rare skip Pulmonary:     Effort: Pulmonary effort is normal.     Breath sounds: Normal breath sounds. No wheezing or rales.  Abdominal:     Palpations: Abdomen is soft.     Tenderness: There is no abdominal tenderness.  Musculoskeletal:     Cervical back: Neck supple.     Right lower leg: No edema.     Left lower leg: No edema.  Lymphadenopathy:     Cervical: No cervical adenopathy.  Skin:    Findings: No lesion or rash.     Comments: No foot lesions  Neurological:     General: No focal deficit present.     Mental Status: He is alert and oriented to person, place, and time.     Comments: Normal sensation in right foot--slightly decreased on left  Psychiatric:        Mood and Affect: Mood normal.        Behavior: Behavior normal.            Assessment & Plan:

## 2023-11-15 NOTE — Assessment & Plan Note (Signed)
 Doing okay but needs to increase exercise Recent cologuard negative Will check PSA Yearly flu vaccine in the fall---prefers no COVID Prevnar 20 at 65

## 2023-11-15 NOTE — Assessment & Plan Note (Signed)
 BP Readings from Last 3 Encounters:  11/15/23 110/68  05/10/23 (!) 98/57  04/19/23 130/76   Good control

## 2023-11-15 NOTE — Assessment & Plan Note (Signed)
 Stable mild symptoms On statin and ASA

## 2023-11-15 NOTE — Assessment & Plan Note (Signed)
 Last A1c up to 7.6% Working on this with endocrinologist

## 2023-11-15 NOTE — Assessment & Plan Note (Signed)
 More ED Will change to daily tadalafil to help BPH also Recheck testosterone

## 2023-11-16 ENCOUNTER — Encounter: Payer: Self-pay | Admitting: Internal Medicine

## 2023-11-17 LAB — MICROALBUMIN / CREATININE URINE RATIO
Creatinine, Urine: 174.6 mg/dL
Microalb/Creat Ratio: 90 mg/g{creat} — ABNORMAL HIGH (ref 0–29)
Microalbumin, Urine: 157.8 ug/mL

## 2023-11-17 LAB — HEPATITIS C ANTIBODY: Hep C Virus Ab: NONREACTIVE

## 2023-11-17 LAB — PSA: Prostate Specific Ag, Serum: 0.6 ng/mL (ref 0.0–4.0)

## 2023-11-17 LAB — TESTOSTERONE: Testosterone: 437 ng/dL (ref 264–916)

## 2023-11-27 ENCOUNTER — Other Ambulatory Visit: Payer: Self-pay | Admitting: Internal Medicine

## 2023-12-18 ENCOUNTER — Ambulatory Visit: Payer: 59 | Admitting: Neurology

## 2023-12-18 ENCOUNTER — Encounter: Payer: Self-pay | Admitting: Neurology

## 2023-12-18 VITALS — BP 136/64 | HR 92 | Ht 66.5 in | Wt 182.0 lb

## 2023-12-18 DIAGNOSIS — R42 Dizziness and giddiness: Secondary | ICD-10-CM | POA: Diagnosis not present

## 2023-12-18 DIAGNOSIS — Z95828 Presence of other vascular implants and grafts: Secondary | ICD-10-CM | POA: Diagnosis not present

## 2023-12-18 DIAGNOSIS — Z8673 Personal history of transient ischemic attack (TIA), and cerebral infarction without residual deficits: Secondary | ICD-10-CM | POA: Diagnosis not present

## 2023-12-18 NOTE — Patient Instructions (Signed)
 I had a long discussion with the patient and his wife regarding his symptoms of vertebrobasilar insufficiency with multi vessel extracranial stenosis subsequent elective bilateral carotid and vertebral origin stenting.  He is doing well except mild subjective dizziness prolonged looking down.  Continue aspirin  for stroke prevention and maintain aggressive risk factor modification with strict control of hypertension with blood pressure goal below 130/90, lipids with LDL cholesterol goal below 70 mg percent and diabetes with hemoglobin A1c goal below 6.5%.  He was also encouraged to eat a healthy diet with lots of fruits, vegetables, cereals and whole grains and to be active and exercise regularly.  Check follow-up carotid ultrasound and transcranial Doppler study.  Return for follow-up in the future in 1 year or call earlier if necessary.

## 2023-12-18 NOTE — Progress Notes (Signed)
 [facility] [address] [place]. [place] [address] [phone]       OFFICE FOLLOW-UP NOTE  Mr. Calvin Smith Date of Birth:  [birth year] Medical Record Number:  [id number]   HPI: Initial visit 05/09/2024 Mr. Hoggatt is a 63 year old Caucasian male seen today for office follow-up visit.  He is accompanied by his wife.  History is obtained from them and review of electronic medical records.  I personally reviewed pertinent available imaging films in PACS.  He has past medical history of diabetes, hyperlipidemia, hypertension, migraines, sleep apnea.  He initially presented on 03/22/2022 with sudden onset of unsteady gait and dizziness.  He had some vertical vertigo and nausea.  He was brought in for evaluation and CT head was unremarkable but CT angiogram of the head and neck showed severe multifocal stenosis involving bilateral extracranial carotids with string signs and moderate stenosis of left vertebral artery origin.  He underwent left vertebral artery origin stenting on 03/24/2022 by Dr. Nanine Babcock and did well.  He subsequently underwent elective left carotid artery stenting on 04/17/2022 later right ICA stenting on 05/17/2022.  He continued to have intermittent symptoms of dizziness particularly with looking down.  He finally had his right vertebral artery origin angioplasty and stenting on 01/10/2023 by Dr. Nanine Babcock.  Patient has done well since then.  He still has some dizziness but is not as bad.  This is mostly when he is looking down for more than a minute or 2.  This is transient and disappears when he puts his head back.  He denies any slurred speech, extremity weakness, gait or balance difficulties or diplopia or slurred speech.  He is on aspirin  and Plavix  and tolerating well but bruises quite easily.  Lab work on/8/24 had shown LDL cholesterol to be optimal at 65 mg percent and hemoglobin A1c at 6.4.  He was seen on follow-up on 01/31/23 by Dr. Nanine Babcock plans to see her  again in 6 months.  He has no new complaints today.  He states his tolerating Lipitor  well without oxalates and pains and sugars under good control.  His blood pressure is usually on the low side and its only 98 systolic today. Update 12/18/2023 : He returns for follow-up after last visit 6 months ago.  Patient states he is doing well.  He has had no recurrent stroke or TIA symptoms.  Remains on aspirin  she is tolerating well without bruising or bleeding.  He is tolerating Lipitor  well without muscle aches and pains.  Last lipid profile in 09/20/2023 was optimal with LDL cholesterol 48 mg percent.  Hemoglobin A1c however was elevated at 7.6.  He is on multiple medications for his sugars but they are still not under good control.  He does see an endocrinologist and plans to discuss continuous glucose monitoring to see if he can get more optimal sugar control.  She has no new complaints today. ROS:   14 system review of systems is positive for bruising, dizziness and all other systems negative  PMH:  Past Medical History:  Diagnosis Date   Allergic rhinitis    Arm fracture, left as a child   DeQuervain's disease (tenosynovitis)    right wrist   Diabetes mellitus    type 2   Erectile dysfunction    GERD (gastroesophageal reflux disease)    Hyperlipidemia    Hypertension    Migraines    Peripheral vascular disease (HCC)    Sleep apnea    uses nightly   Stroke (  HCC) 03/21/2022    Social History:  Social History   Socioeconomic History   Marital status: Married    Spouse name: Not on file   Number of children: Not on file   Years of education: Not on file   Highest education level: Bachelor's degree (e.g., BA, AB, BS)  Occupational History   Occupation: SUPERVISOR--retired 2024    Employer: [employer]    Comment: immunoassay dept  Tobacco Use   Smoking status: Never    Passive exposure: Past   Smokeless tobacco: Never  Vaping Use   Vaping status: Never Used  Substance and Sexual  Activity   Alcohol  use: Not Currently    Comment: rare   Drug use: No   Sexual activity: Yes    Partners: Female  Other Topics Concern   Not on file  Social History Narrative   Regular exercise: light to moderate   Caffeine use: coffee daily   Social Drivers of Corporate investment banker Strain: Low Risk  (01/20/2023)   Overall Financial Resource Strain (CARDIA)    Difficulty of Paying Living Expenses: Not hard at all  Food Insecurity: No Food Insecurity (01/20/2023)   Hunger Vital Sign    Worried About Running Out of Food in the Last Year: Never true    Ran Out of Food in the Last Year: Never true  Transportation Needs: No Transportation Needs (01/20/2023)   PRAPARE - Administrator, Civil Service (Medical): No    Lack of Transportation (Non-Medical): No  Physical Activity: Sufficiently Active (01/20/2023)   Exercise Vital Sign    Days of Exercise per Week: 3 days    Minutes of Exercise per Session: 50 min  Stress: No Stress Concern Present (01/20/2023)   [employer] of Occupational Health - Occupational Stress Questionnaire    Feeling of Stress : Only a little  Social Connections: Socially Integrated (01/20/2023)   Social Connection and Isolation Panel [NHANES]    Frequency of Communication with Friends and Family: More than three times a week    Frequency of Social Gatherings with Friends and Family: Once a week    Attends Religious Services: More than 4 times per year    Active Member of [employer] or Organizations: Yes    Attends Engineer, structural: More than 4 times per year    Marital Status: Married  Catering manager Violence: Not At Risk (01/10/2023)   Humiliation, Afraid, Rape, and Kick questionnaire    Fear of Current or Ex-Partner: No    Emotionally Abused: No    Physically Abused: No    Sexually Abused: No    Medications:   Current Outpatient Medications on File Prior to Visit  Medication Sig Dispense Refill   aspirin  EC 325 MG tablet  Take 1 tablet (325 mg total) by mouth daily. 30 tablet 0   atorvastatin  (LIPITOR ) 80 MG tablet TAKE 1 TABLET BY MOUTH DAILY 90 tablet 3   CONTOUR NEXT TEST test strip USE TO CHECK BLOOD SUGAR DAILY 100 strip 3   glipiZIDE  (GLUCOTROL  XL) 5 MG 24 hr tablet TAKE 1 TABLET BY MOUTH TWICE  DAILY 180 tablet 3   ibuprofen (ADVIL) 200 MG tablet Take 445 mg by mouth every 8 (eight) hours as needed for headache.     Insulin  Pen Needle (BD PEN NEEDLE NANO U/F) 32G X 4 MM MISC USE ONCE DAILY 90 each 3   JARDIANCE  25 MG TABS tablet TAKE 1 TABLET BY MOUTH DAILY 90 tablet  3   LANTUS  SOLOSTAR 100 UNIT/ML Solostar Pen INJECT SUBCUTANEOUSLY 90 UNITS  AT BEDTIME (Patient taking differently: 45 Units 2 (two) times daily. 45 units AM and PM) 90 mL 3   lisinopril  (ZESTRIL ) 20 MG tablet TAKE 1 TABLET BY MOUTH DAILY 90 tablet 3   metFORMIN  (GLUCOPHAGE ) 1000 MG tablet TAKE 1 TABLET BY MOUTH TWICE  DAILY WITH MEALS 180 tablet 3   pantoprazole  (PROTONIX ) 40 MG tablet TAKE ONE TABLET BY MOUTH TWICE DAILY 180 tablet 3   Semaglutide, 2 MG/DOSE, (OZEMPIC, 2 MG/DOSE,) 8 MG/3ML SOPN Inject 2 mg into the skin every Saturday.     tadalafil  (CIALIS ) 5 MG tablet Take 1 tablet (5 mg total) by mouth daily. 30 tablet 11   tamsulosin  (FLOMAX ) 0.4 MG CAPS capsule TAKE 1 CAPSULE BY MOUTH ONCE DAILY 90 capsule 3   Ubrogepant  (UBRELVY ) 100 MG TABS TAKE 1 TABLET BY MOUTH AT ONSET OF HEADACHE, MAY REPEAT IN 2 HOURS IF NEEDED. MAX OF 2 TABS/24 HOURS 30 tablet 3   No current facility-administered medications on file prior to visit.    Allergies:   Allergies  Allergen Reactions   Bisoprolol-Hydrochlorothiazide Other (See Comments)     E.D. Unknown per Pt   Codeine Itching    Physical Exam General: well developed, well nourished pleasant middle-age Caucasian male, seated, in no evident distress Head: head normocephalic and atraumatic.  Neck: supple with no carotid or supraclavicular bruits Cardiovascular: regular rate and rhythm, no  murmurs Musculoskeletal: no deformity Skin:  no rash but multiple scattered petechiae and forearms bilaterally. Vascular:  Normal pulses all extremities Vitals:   12/18/23 1417  BP: 136/64  Pulse: 92   Neurologic Exam Mental Status: Awake and fully alert. Oriented to place and time. Recent and remote memory intact. Attention span, concentration and fund of knowledge appropriate. Mood and affect appropriate.  Cranial Nerves: Fundoscopic exam reveals sharp disc margins. Pupils equal, briskly reactive to light. Extraocular movements full without nystagmus. Visual fields full to confrontation. Hearing intact. Facial sensation intact. Face, tongue, palate moves normally and symmetrically.  Motor: Normal bulk and tone. Normal strength in all tested extremity muscles. Sensory.: intact to touch ,pinprick .position and vibratory sensation.  Coordination: Rapid alternating movements normal in all extremities. Finger-to-nose and heel-to-shin performed accurately bilaterally. Gait and Station: Arises from chair without difficulty. Stance is normal. Gait demonstrates normal stride length and balance . Able to heel, toe and tandem walk with slight difficulty.  Reflexes: 1+ and symmetric. Toes downgoing.      ASSESSMENT: 63 year old Caucasian male with episodes of vertebrobasilar insufficiency due to severe bilateral proximal vertebral origin stenosis as well as severe high-grade bilateral carotid stenosis status post bilateral elective carotid stents as well as vertebral origin splints the last 1 being the right vertebral artery 01/10/2023.  Patient is doing well but does have some subjective dizziness complaints with prolonged bending of the neck down.  Multiple vascular risk factors of diabetes, hypertension, hyperlipidemia, sleep apnea, multivessel extracranial stenosis status post angioplasty stenting     PLAN:I had a long discussion with the patient and his wife regarding his symptoms of  vertebrobasilar insufficiency with multi vessel extracranial stenosis subsequent elective bilateral carotid and vertebral origin stenting.  He is doing well except mild subjective dizziness prolonged looking down.  Continue aspirin  for stroke prevention and maintain aggressive risk factor modification with strict control of hypertension with blood pressure goal below 130/90, lipids with LDL cholesterol goal below 70 mg percent and diabetes with hemoglobin A1c  goal below 6.5%.  He was also encouraged to eat a healthy diet with lots of fruits, vegetables, cereals and whole grains and to be active and exercise regularly.  Check follow-up carotid ultrasound and transcranial Doppler study.  Return for follow-up in the future in 1 year or call earlier if necessary. Greater than 50% of time during this 35 minute visit was spent on counseling,explanation of diagnosis of vertebrobasilar TIAs, symptomatic vertebral and carotid stenosis and stenting, planning of further management, discussion with patient and family and coordination of care Ardella Beaver, MD Note: This document was prepared with digital dictation and possible smart phrase technology. Any transcriptional errors that result from this process are unintentional

## 2024-01-14 ENCOUNTER — Telehealth: Payer: Self-pay

## 2024-01-14 ENCOUNTER — Other Ambulatory Visit (HOSPITAL_COMMUNITY): Payer: Self-pay

## 2024-01-14 ENCOUNTER — Other Ambulatory Visit: Payer: Self-pay | Admitting: Internal Medicine

## 2024-01-14 NOTE — Telephone Encounter (Signed)
 Per Dee Farber phone note looked like pt's insurance approved through til 2026 Pharmacy Patient Advocate Encounter   Received notification from [facility] that Prior Authorization for Ubrelvy  100MG  tablets  has been APPROVED from 09/14/2023 to 09/13/2024    PA #/Case ID/Reference #: [id number]     Is this different  insurance ?

## 2024-01-14 NOTE — Telephone Encounter (Signed)
 Please disregard. Will archive PA. Thanks.

## 2024-01-14 NOTE — Telephone Encounter (Signed)
   It is time to renew PA for Ubrelvy -could not find any documentation on how PT headache is doing in 2025-insurance is asking if PT has 4 or more headache days per month and if they do then I will need documentation stating so. Please advise-Thanks!

## 2024-01-16 ENCOUNTER — Ambulatory Visit (HOSPITAL_COMMUNITY)
Admission: RE | Admit: 2024-01-16 | Discharge: 2024-01-16 | Disposition: A | Discharge status: Home / Self Care | Source: Ambulatory Visit | Attending: Neurology | Admitting: Neurology

## 2024-01-16 DIAGNOSIS — I639 Cerebral infarction, unspecified: Secondary | ICD-10-CM

## 2024-01-16 DIAGNOSIS — Z8673 Personal history of transient ischemic attack (TIA), and cerebral infarction without residual deficits: Secondary | ICD-10-CM | POA: Diagnosis present

## 2024-01-17 ENCOUNTER — Ambulatory Visit: Payer: Self-pay | Admitting: Neurology

## 2024-01-25 NOTE — Progress Notes (Signed)
 Kindly inform the patient that transcranial Doppler study shows low mean flow velocities throughout likely due to thick bones.  Nothing to worry about

## 2024-02-07 ENCOUNTER — Other Ambulatory Visit: Payer: Self-pay | Admitting: Internal Medicine

## 2024-02-07 ENCOUNTER — Telehealth: Payer: Self-pay | Admitting: Internal Medicine

## 2024-02-07 NOTE — Telephone Encounter (Signed)
 Copied from CRM [id number]. Topic: Clinical - Medication Refill >> Feb 07, 2024 10:28 AM Rea C wrote: Medication: Insulin  Pen Needle (BD PEN NEEDLE NANO U/F) 32G X 4 MM MISC  Has the patient contacted their pharmacy? Yes he contacted [facility] and then [facility] called us  and stated that they needed to have this script filled today because the patient does not have any insulin  needles.   This is the patient's preferred pharmacy:  [facility] - [place], [place] - [address] [place] [address] Phone: [phone] Fax: [phone]   Is this the correct pharmacy for this prescription? Yes If no, delete pharmacy and type the correct one.   Has the prescription been filled recently? No  Is the patient out of the medication? Yes  Has the patient been seen for an appointment in the last year OR does the patient have an upcoming appointment? Yes  Can we respond through MyChart? Yes  Agent: Please be advised that Rx refills may take up to 3 business days. We ask that you follow-up with your pharmacy.

## 2024-02-28 ENCOUNTER — Other Ambulatory Visit: Payer: Self-pay | Admitting: Internal Medicine

## 2024-03-06 ENCOUNTER — Telehealth: Payer: Self-pay

## 2024-03-06 ENCOUNTER — Other Ambulatory Visit (HOSPITAL_COMMUNITY): Payer: Self-pay

## 2024-03-06 NOTE — Telephone Encounter (Signed)
 Pharmacy Patient Advocate Encounter   Received notification from Patient Pharmacy that prior authorization for Ubrelvy  100MG  tablets is required/requested.   Insurance verification completed.   The patient is insured through [facility] .   Per test claim: PA required; PA submitted to above mentioned insurance via CoverMyMeds Key/confirmation #/EOC [facility] Status is pending

## 2024-03-06 NOTE — Telephone Encounter (Signed)
 Pharmacy Patient Advocate Encounter  Received notification from [facility] that Prior Authorization for Ubrelvy  100MG  tablets  has been APPROVED from 03/06/2024 to 06/06/2024   PA #/Case ID/Reference #: PA Case ID #: [id number]

## 2024-03-12 ENCOUNTER — Encounter: Payer: Self-pay | Admitting: Gastroenterology

## 2024-03-13 ENCOUNTER — Encounter: Payer: Self-pay | Admitting: Internal Medicine

## 2024-03-13 ENCOUNTER — Ambulatory Visit: Admitting: Internal Medicine

## 2024-03-13 VITALS — BP 112/62 | HR 87 | Temp 97.9°F | Ht 66.5 in | Wt 178.0 lb

## 2024-03-13 DIAGNOSIS — G4733 Obstructive sleep apnea (adult) (pediatric): Secondary | ICD-10-CM | POA: Diagnosis not present

## 2024-03-13 DIAGNOSIS — K2100 Gastro-esophageal reflux disease with esophagitis, without bleeding: Secondary | ICD-10-CM

## 2024-03-13 MED ORDER — LANTUS SOLOSTAR 100 UNIT/ML ~~LOC~~ SOPN: 45.0000 [IU] | PEN_INJECTOR | Freq: Two times a day (BID) | SUBCUTANEOUS | 0 refills | Status: DC

## 2024-03-13 NOTE — Assessment & Plan Note (Signed)
 2 severe nocturnal spells in the past couple of weeks Despite the bid pantoprazole  Discussed that this could be his sleep apnea as well----didn't have machine on for these times Still makes sense to see GI--has burps, water brash, etc still (despite no dysphagia)

## 2024-03-13 NOTE — Progress Notes (Signed)
 Subjective:    Patient ID: Calvin Smith, male    DOB: [birth year], 63 y.o.   MRN: [id number]  HPI Here with wife due to trouble with acid reflux  Had another bad choking spell 2 nights ago Has had bad taste associated with this Second one in a month Can't breathe right for a while On protonix  twice a day---on empty stomach Ongoing bad burps Supper 6-7PM---- will have something later if sugars are low. Props sometimes--but sleeps flat other times No dysphagia  Sleeps with CPAP mostly--but hasn't used it in the past few weeks  Current Outpatient Medications on File Prior to Visit  Medication Sig Dispense Refill   aspirin  EC 325 MG tablet Take 1 tablet (325 mg total) by mouth daily. 30 tablet 0   atorvastatin  (LIPITOR ) 80 MG tablet TAKE 1 TABLET BY MOUTH DAILY 90 tablet 3   BD PEN NEEDLE NANO ULTRAFINE 32G X 4 MM MISC USE ONCE DAILY 90 each 3   CONTOUR NEXT TEST test strip USE TO CHECK BLOOD SUGAR DAILY 100 strip 3   glipiZIDE  (GLUCOTROL  XL) 5 MG 24 hr tablet TAKE 1 TABLET BY MOUTH TWICE  DAILY 180 tablet 3   ibuprofen (ADVIL) 200 MG tablet Take 445 mg by mouth every 8 (eight) hours as needed for headache.     JARDIANCE  25 MG TABS tablet TAKE 1 TABLET BY MOUTH DAILY 90 tablet 3   lisinopril  (ZESTRIL ) 20 MG tablet TAKE 1 TABLET BY MOUTH DAILY 90 tablet 3   metFORMIN  (GLUCOPHAGE ) 1000 MG tablet TAKE 1 TABLET BY MOUTH TWICE  DAILY WITH MEALS 180 tablet 3   pantoprazole  (PROTONIX ) 40 MG tablet TAKE ONE TABLET BY MOUTH TWICE DAILY 180 tablet 3   Semaglutide, 2 MG/DOSE, (OZEMPIC, 2 MG/DOSE,) 8 MG/3ML SOPN Inject 2 mg into the skin every Saturday.     tadalafil  (CIALIS ) 5 MG tablet Take 1 tablet (5 mg total) by mouth daily. 30 tablet 11   tamsulosin  (FLOMAX ) 0.4 MG CAPS capsule TAKE 1 CAPSULE BY MOUTH ONCE DAILY 90 capsule 3   Ubrogepant  (UBRELVY ) 100 MG TABS TAKE 1 TABLET BY MOUTH AT ONSET OF HEADACHE, MAY REPEAT IN 2 HOURS IF NEEDED. MAX OF 2 TABS/24 HOURS 30 tablet 3   No current  facility-administered medications on file prior to visit.    Allergies  Allergen Reactions   Bisoprolol-Hydrochlorothiazide Other (See Comments)     E.D. Unknown per Pt   Codeine Itching    Past Medical History:  Diagnosis Date   Allergic rhinitis    Arm fracture, left as a child   DeQuervain's disease (tenosynovitis)    right wrist   Diabetes mellitus    type 2   Erectile dysfunction    GERD (gastroesophageal reflux disease)    Hyperlipidemia    Hypertension    Migraines    Peripheral vascular disease (HCC)    Sleep apnea    uses nightly   Stroke (HCC) 03/21/2022    Past Surgical History:  Procedure Laterality Date   Cardiolite  12/05/2004   Negative EF 66%   CARPAL TUNNEL RELEASE  04/07/2002   left    FRACTURE SURGERY     IR ANGIO INTRA EXTRACRAN SEL COM CAROTID INNOMINATE BILAT MOD SED  03/23/2022   IR ANGIO INTRA EXTRACRAN SEL COM CAROTID INNOMINATE BILAT MOD SED  05/24/2022   IR ANGIO INTRA EXTRACRAN SEL COM CAROTID INNOMINATE BILAT MOD SED  11/28/2022   IR ANGIO INTRA EXTRACRAN SEL COM CAROTID INNOMINATE  UNI L MOD SED  04/17/2022   IR ANGIO VERTEBRAL SEL SUBCLAVIAN INNOMINATE BILAT MOD SED  03/23/2022   IR ANGIO VERTEBRAL SEL SUBCLAVIAN INNOMINATE UNI L MOD SED  04/17/2022   IR ANGIO VERTEBRAL SEL SUBCLAVIAN INNOMINATE UNI L MOD SED  05/24/2022   IR ANGIO VERTEBRAL SEL SUBCLAVIAN INNOMINATE UNI L MOD SED  01/10/2023   IR ANGIO VERTEBRAL SEL VERTEBRAL BILAT MOD SED  11/28/2022   IR ANGIO VERTEBRAL SEL VERTEBRAL UNI L MOD SED  03/29/2022   IR ANGIO VERTEBRAL SEL VERTEBRAL UNI R MOD SED  01/10/2023   IR ANGIOGRAM EXTREMITY LEFT  03/29/2022   IR ANGIOGRAM FOLLOW UP STUDY  05/24/2022   IR INTRA CRAN STENT  01/10/2023   IR INTRAVSC STENT CERV CAROTID W/EMB-PROT MOD SED INCL ANGIO  04/17/2022   IR INTRAVSC STENT CERV CAROTID W/EMB-PROT MOD SED INCL ANGIO  05/24/2022   IR TRANSCATH EXCRAN VERT OR CAR A STENT  03/24/2022   IR US  GUIDE VASC ACCESS RIGHT  03/23/2022   IR  US  GUIDE VASC ACCESS RIGHT  03/24/2022   IR US  GUIDE VASC ACCESS RIGHT  04/17/2022   IR US  GUIDE VASC ACCESS RIGHT  05/24/2022   IR US  GUIDE VASC ACCESS RIGHT  11/28/2022   IR US  GUIDE VASC ACCESS RIGHT  01/10/2023   RADIOLOGY WITH ANESTHESIA Left 03/24/2022   Procedure: IR WITH ANESTHESIA;  Surgeon: de Macedo Rodrigues, [name], MD;  Location: [facility] OR;  Service: Radiology;  Laterality: Left;   RADIOLOGY WITH ANESTHESIA N/A 04/17/2022   Procedure: LEFT CAROTID STENT;  Surgeon: de Macedo Rodrigues, [name], MD;  Location: [facility] OR;  Service: Radiology;  Laterality: N/A;   RADIOLOGY WITH ANESTHESIA N/A 05/24/2022   Procedure: Right carotid angioplasty with possible stenting;  Surgeon: de Macedo Rodrigues, [name], MD;  Location[facility] OR;  Service: Radiology;  Laterality: N/A;   RADIOLOGY WITH ANESTHESIA N/A 01/10/2023   Procedure: Cervical right vertebral artery angioplasty/stenting;  Surgeon: de Macedo Rodrigues, [name], MD;  Location: [facility] OR;  Service: Radiology;  Laterality: N/A;   WRIST SURGERY  08/07/2002   DeQuervain release   WRIST SURGERY Left 08/07/2006   ORIF l wrist DVR plate screwhead autologous    Family History  Problem Relation Age of Onset   Hypertension Father    Lymphoma Father        non-hodgkins   Uterine cancer Maternal Grandmother    Hypertension Paternal Grandfather    Lung cancer Paternal Aunt    Coronary artery disease Paternal Uncle    Colon cancer Unknown        in 1 relative (?aunt)    Social History   Socioeconomic History   Marital status: Married    Spouse name: Not on file   Number of children: Not on file   Years of education: Not on file   Highest education level: Bachelor's degree (e.g., BA, AB, BS)  Occupational History   Occupation: SUPERVISOR--retired 2024    Employer: [employer]    Comment: immunoassay dept  Tobacco Use   Smoking status: Never    Passive exposure: Past   Smokeless tobacco: Never  Vaping Use   Vaping status: Never Used   Substance and Sexual Activity   Alcohol  use: Not Currently    Comment: rare   Drug use: No   Sexual activity: Yes    Partners: Female  Other Topics Concern   Not on file  Social History Narrative   Regular exercise: light to moderate   Caffeine use:  coffee daily   Social Drivers of Corporate investment banker Strain: Low Risk  (03/12/2024)   Overall Financial Resource Strain (CARDIA)    Difficulty of Paying Living Expenses: Not hard at all  Food Insecurity: No Food Insecurity (03/12/2024)   Hunger Vital Sign    Worried About Running Out of Food in the Last Year: Never true    Ran Out of Food in the Last Year: Never true  Transportation Needs: No Transportation Needs (03/12/2024)   PRAPARE - Administrator, Civil Service (Medical): No    Lack of Transportation (Non-Medical): No  Physical Activity: Insufficiently Active (03/12/2024)   Exercise Vital Sign    Days of Exercise per Week: 2 days    Minutes of Exercise per Session: 30 min  Stress: No Stress Concern Present (03/12/2024)   [employer] of Occupational Health - Occupational Stress Questionnaire    Feeling of Stress: Only a little  Social Connections: Socially Integrated (03/12/2024)   Social Connection and Isolation Panel    Frequency of Communication with Friends and Family: More than three times a week    Frequency of Social Gatherings with Friends and Family: Once a week    Attends Religious Services: More than 4 times per year    Active Member of [employer] or Organizations: Yes    Attends Banker Meetings: 1 to 4 times per year    Marital Status: Married  Catering manager Violence: Not At Risk (01/10/2023)   Humiliation, Afraid, Rape, and Kick questionnaire    Fear of Current or Ex-Partner: No    Emotionally Abused: No    Physically Abused: No    Sexually Abused: No   Review of Systems Weight is stable Appetite is down somewhat    Objective:   Physical Exam Constitutional:      Appearance:  Normal appearance.  Cardiovascular:     Rate and Rhythm: Normal rate and regular rhythm.     Heart sounds: No murmur heard.    No gallop.  Pulmonary:     Effort: Pulmonary effort is normal.     Breath sounds: Normal breath sounds. No wheezing or rales.  Abdominal:     General: There is no distension.     Palpations: Abdomen is soft.     Tenderness: There is no abdominal tenderness. There is no guarding or rebound.  Musculoskeletal:     Cervical back: Neck supple.     Right lower leg: No edema.     Left lower leg: No edema.  Lymphadenopathy:     Cervical: No cervical adenopathy.  Neurological:     Mental Status: He is alert.            Assessment & Plan:

## 2024-03-13 NOTE — Assessment & Plan Note (Signed)
 Discussed raising the HOB and using the CPAP nightly

## 2024-04-01 ENCOUNTER — Other Ambulatory Visit: Payer: Self-pay | Admitting: Internal Medicine

## 2024-05-07 ENCOUNTER — Other Ambulatory Visit

## 2024-05-07 ENCOUNTER — Ambulatory Visit: Admitting: Gastroenterology

## 2024-05-07 ENCOUNTER — Encounter: Payer: Self-pay | Admitting: Gastroenterology

## 2024-05-07 VITALS — BP 118/70 | HR 92 | Ht 66.5 in | Wt 178.4 lb

## 2024-05-07 DIAGNOSIS — R198 Other specified symptoms and signs involving the digestive system and abdomen: Secondary | ICD-10-CM

## 2024-05-07 DIAGNOSIS — K219 Gastro-esophageal reflux disease without esophagitis: Secondary | ICD-10-CM

## 2024-05-07 DIAGNOSIS — R195 Other fecal abnormalities: Secondary | ICD-10-CM

## 2024-05-07 DIAGNOSIS — K921 Melena: Secondary | ICD-10-CM | POA: Diagnosis not present

## 2024-05-07 LAB — COMPREHENSIVE METABOLIC PANEL WITH GFR
ALT: 30 U/L (ref 0–53)
AST: 27 U/L (ref 0–37)
Albumin: 4.4 g/dL (ref 3.5–5.2)
Alkaline Phosphatase: 62 U/L (ref 39–117)
BUN: 14 mg/dL (ref 6–23)
CO2: 24 meq/L (ref 19–32)
Calcium: 9.5 mg/dL (ref 8.4–10.5)
Chloride: 101 meq/L (ref 96–112)
Creatinine, Ser: 1.19 mg/dL (ref 0.40–1.50)
GFR: 65.1 mL/min (ref >60.00)
Glucose, Bld: 139 mg/dL — ABNORMAL HIGH (ref 70–99)
Potassium: 4.5 meq/L (ref 3.5–5.1)
Sodium: 134 meq/L — ABNORMAL LOW (ref 135–145)
Total Bilirubin: 0.9 mg/dL (ref 0.2–1.2)
Total Protein: 7.5 g/dL (ref 6.0–8.3)

## 2024-05-07 LAB — CBC WITH DIFFERENTIAL/PLATELET
Basophils Absolute: 0.1 K/uL (ref 0.0–0.1)
Basophils Relative: 0.9 % (ref 0.0–3.0)
Eosinophils Absolute: 0.2 K/uL (ref 0.0–0.7)
Eosinophils Relative: 2.5 % (ref 0.0–5.0)
HCT: 43.7 % (ref 39.0–52.0)
Hemoglobin: 14.9 g/dL (ref 13.0–17.0)
Lymphocytes Relative: 21.8 % (ref 12.0–46.0)
Lymphs Abs: 1.5 K/uL (ref 0.7–4.0)
MCHC: 34.1 g/dL (ref 30.0–36.0)
MCV: 91.7 fl (ref 78.0–100.0)
Monocytes Absolute: 0.5 K/uL (ref 0.1–1.0)
Monocytes Relative: 7.9 % (ref 3.0–12.0)
Neutro Abs: 4.7 K/uL (ref 1.4–7.7)
Neutrophils Relative %: 66.9 % (ref 43.0–77.0)
Platelets: 210 K/uL (ref 150.0–400.0)
RBC: 4.76 Mil/uL (ref 4.22–5.81)
RDW: 14.4 % (ref 11.5–15.5)
WBC: 7 K/uL (ref 4.0–10.5)

## 2024-05-07 MED ORDER — FAMOTIDINE 20 MG PO TABS: 20.0000 mg | ORAL_TABLET | Freq: Two times a day (BID) | ORAL | 3 refills | Status: DC

## 2024-05-07 NOTE — Progress Notes (Signed)
 Calvin Smith [id number] 23-Jun-1961   Chief Complaint: Choking at night, heartburn, belching  Referring Provider: Jimmy Charlie FERNS, MD Primary GI MD: Sampson  HPI: Calvin Smith is a 63 y.o. male with past medical history of T2DM, GERD, HLD, HTN, migraines, peripheral vascular disease, sleep apnea, CVA 03/21/2022, vertebrobasilar insufficiency with multivessel extracranial stenosis s/p elective bilateral carotid and vertebral origin stenting, vertebrobasilar insufficiency who presents today for a complaint of GERD.    Last visit with neurology 12/18/2023, doing well at that time except mild subjective dizziness with prolonged looking down.  On aspirin  for stroke prevention and with recommended follow-up in 1 year, earlier if necessary.  Seen by PCP 03/13/2024 for complaint of acid reflux despite use of Protonix  twice daily.  Two severe nocturnal spells within the previous 2 weeks.  Denied any dysphagia.  Noted to have sleep apnea and was not on CPAP at these times.  No record of prior EGD. Negative Cologuard 05/24/2023.   Patient here today with his wife.  States that over the last couple years he has had intermittent episodes of intermittent episodes of acid reflux and choking which primarily occurs at night.  Wakes up from sleep feeling like he cannot breathe and has burning in his chest and throat, choking/gagging, difficult to catch his breath.  He does have sleep apnea but states that these episodes can occur whether or not he is on his CPAP machine.  Rare to have it happen during the day but this has occurred as well.  The choking episodes have been occurring about once a month for the last couple of years.  States he has tried sleeping on a wedge pillow but is unable to sleep this way.  Also has some intermittent heartburn, though this has improved since starting Protonix .  Has burping almost daily which has not improved on Protonix .  Has eliminated carbonated beverages but  continues to have belching which can last all day.  No clear dietary triggers as it occurs daily.  States he does not have any significant history of reflux or heartburn and only started having symptoms in the last couple years.  He denies dysphagia, abdominal pain.  Has been noticing some intermittent black stools, with the last episode occurring a couple weeks ago.  Denies diarrhea.  Denies any rectal bleeding or pain with bowel movements.  Has a bowel movement up to twice daily.  States he can have a couple days of loose stools, they go a couple days without a bowel movement and have constipation.  Takes an OTC laxative as needed which helps with constipation.  Has not tried a fiber supplement.  Change in bowel habits occurred within the last 2 years as well.  Did have a negative Cologuard test 05/2023.  Notably he did start Ozempic a couple years ago.  No prior colonoscopy or EGD.  Previous GI Procedures/Imaging      Past Medical History:  Diagnosis Date   Allergic rhinitis    Arm fracture, left as a child   DeQuervain's disease (tenosynovitis)    right wrist   Diabetes mellitus    type 2   Erectile dysfunction    GERD (gastroesophageal reflux disease)    Hyperlipidemia    Hypertension    Migraines    Peripheral vascular disease    Sleep apnea    uses nightly   Stroke (HCC) 03/21/2022    Past Surgical History:  Procedure Laterality Date   Cardiolite  12/05/2004   Negative  EF 66%   CARPAL TUNNEL RELEASE  04/07/2002   left    FRACTURE SURGERY     IR ANGIO INTRA EXTRACRAN SEL COM CAROTID INNOMINATE BILAT MOD SED  03/23/2022   IR ANGIO INTRA EXTRACRAN SEL COM CAROTID INNOMINATE BILAT MOD SED  05/24/2022   IR ANGIO INTRA EXTRACRAN SEL COM CAROTID INNOMINATE BILAT MOD SED  11/28/2022   IR ANGIO INTRA EXTRACRAN SEL COM CAROTID INNOMINATE UNI L MOD SED  04/17/2022   IR ANGIO VERTEBRAL SEL SUBCLAVIAN INNOMINATE BILAT MOD SED  03/23/2022   IR ANGIO VERTEBRAL SEL SUBCLAVIAN  INNOMINATE UNI L MOD SED  04/17/2022   IR ANGIO VERTEBRAL SEL SUBCLAVIAN INNOMINATE UNI L MOD SED  05/24/2022   IR ANGIO VERTEBRAL SEL SUBCLAVIAN INNOMINATE UNI L MOD SED  01/10/2023   IR ANGIO VERTEBRAL SEL VERTEBRAL BILAT MOD SED  11/28/2022   IR ANGIO VERTEBRAL SEL VERTEBRAL UNI L MOD SED  03/29/2022   IR ANGIO VERTEBRAL SEL VERTEBRAL UNI R MOD SED  01/10/2023   IR ANGIOGRAM EXTREMITY LEFT  03/29/2022   IR ANGIOGRAM FOLLOW UP STUDY  05/24/2022   IR INTRA CRAN STENT  01/10/2023   IR INTRAVSC STENT CERV CAROTID W/EMB-PROT MOD SED INCL ANGIO  04/17/2022   IR INTRAVSC STENT CERV CAROTID W/EMB-PROT MOD SED INCL ANGIO  05/24/2022   IR TRANSCATH EXCRAN VERT OR CAR A STENT  03/24/2022   IR US  GUIDE VASC ACCESS RIGHT  03/23/2022   IR US  GUIDE VASC ACCESS RIGHT  03/24/2022   IR US  GUIDE VASC ACCESS RIGHT  04/17/2022   IR US  GUIDE VASC ACCESS RIGHT  05/24/2022   IR US  GUIDE VASC ACCESS RIGHT  11/28/2022   IR US  GUIDE VASC ACCESS RIGHT  01/10/2023   RADIOLOGY WITH ANESTHESIA Left 03/24/2022   Procedure: IR WITH ANESTHESIA;  Surgeon: de Macedo Rodrigues, [name], MD;  Location: [facility] OR;  Service: Radiology;  Laterality: Left;   RADIOLOGY WITH ANESTHESIA N/A 04/17/2022   Procedure: LEFT CAROTID STENT;  Surgeon: de Macedo Rodrigues, [name], MD;  Location: [facility] OR;  Service: Radiology;  Laterality: N/A;   RADIOLOGY WITH ANESTHESIA N/A 05/24/2022   Procedure: Right carotid angioplasty with possible stenting;  Surgeon: de Macedo Rodrigues, [name], MD;  Location: [facility] OR;  Service: Radiology;  Laterality: N/A;   RADIOLOGY WITH ANESTHESIA N/A 01/10/2023   Procedure: Cervical right vertebral artery angioplasty/stenting;  Surgeon: de Macedo Rodrigues, [name], MD;  Location: [facility] OR;  Service: Radiology;  Laterality: N/A;   WRIST SURGERY  08/07/2002   DeQuervain release   WRIST SURGERY Left 08/07/2006   ORIF l wrist DVR plate screwhead autologous    Current Outpatient Medications  Medication Sig Dispense Refill    aspirin  EC 325 MG tablet Take 1 tablet (325 mg total) by mouth daily. 30 tablet 0   atorvastatin  (LIPITOR ) 80 MG tablet TAKE 1 TABLET BY MOUTH DAILY 90 tablet 3   BD PEN NEEDLE NANO ULTRAFINE 32G X 4 MM MISC USE ONCE DAILY 90 each 3   CONTOUR NEXT TEST test strip USE TO CHECK BLOOD SUGAR DAILY 100 strip 3   glipiZIDE  (GLUCOTROL  XL) 5 MG 24 hr tablet TAKE 1 TABLET BY MOUTH TWICE  DAILY 180 tablet 3   ibuprofen (ADVIL) 200 MG tablet Take 445 mg by mouth every 8 (eight) hours as needed for headache.     insulin  glargine (LANTUS  SOLOSTAR) 100 UNIT/ML Solostar Pen INJECT SUBCUTANEOUSLY 90 UNITS  AT BEDTIME 90 mL 2   JARDIANCE  25 MG TABS tablet  TAKE 1 TABLET BY MOUTH DAILY 90 tablet 3   lisinopril  (ZESTRIL ) 20 MG tablet TAKE 1 TABLET BY MOUTH DAILY 90 tablet 3   metFORMIN  (GLUCOPHAGE ) 1000 MG tablet TAKE 1 TABLET BY MOUTH TWICE  DAILY WITH MEALS 180 tablet 3   pantoprazole  (PROTONIX ) 40 MG tablet TAKE ONE TABLET BY MOUTH TWICE DAILY 180 tablet 3   Semaglutide, 2 MG/DOSE, (OZEMPIC, 2 MG/DOSE,) 8 MG/3ML SOPN Inject 2 mg into the skin every Saturday.     tadalafil  (CIALIS ) 5 MG tablet Take 1 tablet (5 mg total) by mouth daily. 30 tablet 11   tamsulosin  (FLOMAX ) 0.4 MG CAPS capsule TAKE 1 CAPSULE BY MOUTH ONCE DAILY 90 capsule 3   Ubrogepant  (UBRELVY ) 100 MG TABS TAKE 1 TABLET BY MOUTH AT ONSET OF HEADACHE, MAY REPEAT IN 2 HOURS IF NEEDED. MAX OF 2 TABS/24 HOURS 30 tablet 3   No current facility-administered medications for this visit.    Allergies as of 05/07/2024 - Review Complete 05/07/2024  Allergen Reaction Noted   Bisoprolol-hydrochlorothiazide Other (See Comments) 02/15/2007   Codeine Itching 09/06/2010    Family History  Problem Relation Age of Onset   Hypertension Father    Lymphoma Father        non-hodgkins   Uterine cancer Maternal Grandmother    Hypertension Paternal Grandfather    Lung cancer Paternal Aunt    Coronary artery disease Paternal Uncle    Colon cancer Unknown         in 1 relative (?aunt)    Social History   Tobacco Use   Smoking status: Never    Passive exposure: Past   Smokeless tobacco: Never  Vaping Use   Vaping status: Never Used  Substance Use Topics   Alcohol  use: Not Currently    Comment: rare   Drug use: No     Review of Systems:    Constitutional: No unexplained weight loss, fever, chills Cardiovascular: No chest pain Respiratory: No SOB  Gastrointestinal: See HPI and otherwise negative   Physical Exam:  Vital signs: BP 118/70 (BP Location: Left Arm, Patient Position: Sitting, Cuff Size: Normal)   Pulse 92   Ht 5' 6.5 (1.689 m)   Wt 178 lb 6 oz (80.9 kg)   SpO2 97%   BMI 28.36 kg/m   Constitutional: Pleasant, overweight male in NAD, alert and cooperative Head:  Normocephalic and atraumatic.  Eyes: No scleral icterus.  Respiratory: Respirations even and unlabored. Lungs clear to auscultation bilaterally.  No wheezes, crackles, or rhonchi.  Cardiovascular:  Regular rate and rhythm. No murmurs. No peripheral edema. Gastrointestinal:  Soft, obese abdomen, nontender. No rebound or guarding. Normal bowel sounds. No appreciable masses or hepatomegaly. Rectal:  Not performed.  Neurologic:  Alert and oriented x4;  grossly normal neurologically.  Skin:   Dry and intact without significant lesions or rashes. Psychiatric: Oriented to person, place and time. Demonstrates good judgement and reason without abnormal affect or behaviors.   RELEVANT LABS AND IMAGING: CBC    Component Value Date/Time   WBC 11.1 (H) 01/11/2023 0955   RBC 4.44 01/11/2023 0955   HGB 14.2 01/11/2023 0955   HGB 15.4 11/13/2022 1055   HCT 40.6 01/11/2023 0955   HCT 44.0 11/13/2022 1055   PLT 241 01/11/2023 0955   PLT 246 11/13/2022 1055   MCV 91.4 01/11/2023 0955   MCV 94 11/13/2022 1055   MCH 32.0 01/11/2023 0955   MCHC 35.0 01/11/2023 0955   RDW 12.6 01/11/2023 0955   RDW  12.7 11/13/2022 1055   LYMPHSABS 1.7 01/10/2023 0643   LYMPHSABS 2.0  03/14/2017 1119   MONOABS 0.7 01/10/2023 0643   EOSABS 0.1 01/10/2023 0643   EOSABS 0.1 03/14/2017 1119   BASOSABS 0.0 01/10/2023 0643   BASOSABS 0.0 03/14/2017 1119    CMP     Component Value Date/Time   NA 131 (L) 01/11/2023 0955   NA 136 11/13/2022 1055   K 3.8 01/11/2023 0955   CL 99 01/11/2023 0955   CO2 21 (L) 01/11/2023 0955   GLUCOSE 216 (H) 01/11/2023 0955   BUN 18 01/11/2023 0955   BUN 14 11/13/2022 1055   CREATININE 1.26 (H) 01/11/2023 0955   CALCIUM  8.5 (L) 01/11/2023 0955   PROT 6.3 (L) 01/11/2023 0955   PROT 6.8 11/13/2022 1055   ALBUMIN 3.5 01/11/2023 0955   ALBUMIN 4.3 11/13/2022 1055   AST 32 01/11/2023 0955   ALT 24 01/11/2023 0955   ALKPHOS 64 01/11/2023 0955   BILITOT 1.1 01/11/2023 0955   BILITOT 0.8 11/13/2022 1055   GFRNONAA >60 01/11/2023 0955   GFRAA 74 02/17/2020 0855     Assessment/Plan:   GERD Intermittent black stools Patient seen today for complaint of intermittent heartburn, chronic belching, and intermittent episodes of choking/gagging occurring at night.  Symptoms ongoing for the last couple years.  Wakes up feeling like he cannot breathe and has burning in his chest and throat as well as feeling of choking and gagging.  Does have sleep apnea but states that these episodes occur whether or not he is using his CPAP.  Can happen during the day as well but this is rare.  Tried sleeping on a wedge pillow but was unable to do this.  Has been taking Protonix  twice daily which has improved his heartburn but he continues to have these nocturnal episodes.  No clear dietary triggers.  Tries to avoid eating before bedtime.  Belching is constant and can last all day.  Did start Ozempic a couple years ago, consider medication side effect. No prior EGD.  His wife sees Dr. Avram and patient requests to establish care with him as well.  - Plan for EGD. Discuss with Dr. Avram, otherwise will set up with another provider. I thoroughly discussed the  procedure with the patient to include nature of the procedure, alternatives, benefits, and risks (including but not limited to bleeding, infection, perforation, anesthesia/cardiac/pulmonary complications). Patient verbalized understanding and gave verbal consent to proceed with procedure.  - Plan to request neurologic clearance for procedure based on history of carotid and vertebral origin stenting, CVA 2023 - Will need to hold Ozempic prior - Labs today: CBC, CMP - Continue Protonix  40 mg twice daily - Add famotidine  20 mg at night - Lifestyle modifications discussed   Constipation Patient reports in the last 2 years he has had alternating bowel habits.  Can go a couple days without a bowel movement, then have a couple days of looser stools.  He is taking an OTC laxative as needed which does help.  No prior colonoscopy.  Had a negative Cologuard test 2024.  Did start Ozempic a couple years ago, consider medication side effect.  - Start fiber supplement - Add MiraLAX  as needed if continued constipation.   Calvin Furbish, PA-C  Gastroenterology 05/07/2024, 9:45 AM  Patient Care Team: Calvin Charlie FERNS, MD as PCP - General

## 2024-05-07 NOTE — Patient Instructions (Addendum)
 We have sent the following medications to your pharmacy for you to pick up at your convenience: Famotidine    Your provider has requested that you go to the basement level for lab work before leaving today. Press B on the elevator. The lab is located at the first door on the left as you exit the elevator.  A high fiber diet with plenty of fluids (up to 8 glasses of water daily) is suggested to relieve these symptoms.  Metamucil, 1 tablespoon once or twice daily can be used to keep bowels regular if needed.  Due to recent changes in healthcare laws, you may see the results of your imaging and laboratory studies on MyChart before your provider has had a chance to review them.  We understand that in some cases there may be results that are confusing or concerning to you. Not all laboratory results come back in the same time frame and the provider may be waiting for multiple results in order to interpret others.  Please give us  48 hours in order for your provider to thoroughly review all the results before contacting the office for clarification of your results.   _______________________________________________________  If your blood pressure at your visit was 140/90 or greater, please contact your primary care physician to follow up on this.  _______________________________________________________  If you are age 70 or older, your body mass index should be between 23-30. Your Body mass index is 28.36 kg/m. If this is out of the aforementioned range listed, please consider follow up with your Primary Care Provider.  If you are age 60 or younger, your body mass index should be between 19-25. Your Body mass index is 28.36 kg/m. If this is out of the aformentioned range listed, please consider follow up with your Primary Care Provider.   ________________________________________________________  The [facility] providers would like to encourage you to use MYCHART to communicate with providers for  non-urgent requests or questions.  Due to long hold times on the telephone, sending your provider a message by [facility] may be a faster and more efficient way to get a response.  Please allow 48 business hours for a response.  Please remember that this is for non-urgent requests.  _______________________________________________________  [facility] is using a team-based approach to care.  Your team is made up of your doctor and two to three APPS. Our APPS (Nurse Practitioners and Physician Assistants) work with your physician to ensure care continuity for you. They are fully qualified to address your health concerns and develop a treatment plan. They communicate directly with your gastroenterologist to care for you. Seeing the Advanced Practice Practitioners on your physician's team can help you by facilitating care more promptly, often allowing for earlier appointments, access to diagnostic testing, procedures, and other specialty referrals.   Thank you for choosing me and [facility].  Camie Furbish, PA-C   GERD in Adults: Diet Changes When you have gastroesophageal reflux disease (GERD), you may need to make changes to your diet. Choosing the right foods can help with your symptoms. Think about working with an expert in healthy eating called a dietitian. They can help you make healthy food choices. What are tips for following this plan? Reading food labels Look for foods that are low in saturated fat. Foods that may help with your symptoms include: Foods with less than 5% of daily value (DV) of fat. Foods with 0 grams of trans fat. Cooking [employer] in ways that don't use a lot of  fat. These ways include: Baking. Steaming. Grilling. Broiling. To add flavor, try to use herbs that are low in spice and acidity. Avoid frying your food. Meal planning  Eat small meals often rather than eating 3 large meals each day. Eat your meals slowly in a place where you feel  relaxed. If told by your health care provider, avoid: Foods that cause symptoms. Keep a food diary to keep track of foods that cause symptoms. Alcohol . Drinking a lot of liquid with meals. General instructions For 2-3 hours after you eat, avoid: Bending over. Exercise. Lying down. Chew sugar-free gum after meals. What foods should I eat? Eat a healthy diet. Try to include: Foods with high amounts of fiber. These include: Fruits and vegetables. Whole grains and beans. Low-fat dairy products. Lean meats, fish, and poultry. Egg whites. Foods that cause symptoms in someone else may not cause symptoms for you. Work with your provider to find foods that are safe for you. The items listed above may not be all the foods and drinks you can have. Talk with a dietitian to learn more. The items listed above may not be a complete list of foods and beverages you can eat and drink. Contact a dietitian for more information. What foods should I avoid? Limiting some of these foods may help with your symptoms. Each person is different. Talk with a dietitian or your provider to help you find the exact foods to avoid. Some of the foods to avoid may include: Fruits Fruits with a lot of acid in them. These may include citrus fruits, such as oranges, grapefruit, pineapple, and lemons. Vegetables Deep-fried vegetables, such as [place] fries. Vegetables, sauces, or toppings made with added fat and vegetables with acid in them. These may include tomatoes and tomato products, chili peppers, onions, garlic, and horseradish. Grains Pastries or quick breads with added fat. Meats and other proteins High-fat meats, such as fatty beef or pork, hot dogs, ribs, ham, sausage, salami, and bacon. Fried meat or protein, such as fried fish and fried chicken. Egg yolks. Fats and oils Butter. Margarine. Shortening. Ghee. Drinks Coffee and other drinks with caffeine in them. Fizzy and sugary drinks, such as soda and energy  drinks. Fruit juice made with acidic fruits, such as orange or grapefruit. Tomato juice. Sweets and desserts Chocolate and cocoa. Donuts. Seasonings and condiments Mint, such as peppermint and spearmint. Condiments, herbs, or seasonings that cause symptoms. These may include curry, hot sauce, or vinegar-based salad dressings. The items listed above may not be all the foods and drinks you should avoid. Talk with a dietitian to learn more. Questions to ask your health care provider Changes to your diet and everyday life are often the first steps taken to manage symptoms of GERD. If these changes don't help, talk with your provider about taking medicines. Where to find more information [employer] Gastrointestinal Disorders: aboutgerd.org This information is not intended to replace advice given to you by your health care provider. Make sure you discuss any questions you have with your health care provider. Document Revised: 06/05/2023 Document Reviewed: 12/20/2022 Elsevier Patient Education  2024 [employer].

## 2024-05-08 ENCOUNTER — Ambulatory Visit: Payer: Self-pay | Admitting: Gastroenterology

## 2024-05-09 ENCOUNTER — Other Ambulatory Visit: Payer: Self-pay

## 2024-05-09 MED ORDER — LISINOPRIL 20 MG PO TABS: 20.0000 mg | ORAL_TABLET | Freq: Every day | ORAL | 1 refills | Status: AC

## 2024-05-09 MED ORDER — EMPAGLIFLOZIN 25 MG PO TABS: 25.0000 mg | ORAL_TABLET | Freq: Every day | ORAL | 1 refills | Status: AC

## 2024-05-09 NOTE — Telephone Encounter (Signed)
 Pt needs a TOC appt with Dr Bennett prior to the physical he has in April 2026. Please help him get scheduled. Thank you.

## 2024-05-12 ENCOUNTER — Telehealth: Payer: Self-pay | Admitting: Gastroenterology

## 2024-05-12 DIAGNOSIS — K921 Melena: Secondary | ICD-10-CM

## 2024-05-12 DIAGNOSIS — K219 Gastro-esophageal reflux disease without esophagitis: Secondary | ICD-10-CM

## 2024-05-12 NOTE — Telephone Encounter (Signed)
 Dr. Avram has accepted care for this patient. Please call patient to schedule next available EGD. Does not need neuro clearance.

## 2024-05-13 NOTE — Telephone Encounter (Signed)
 Called and spoke with pt. Discussed getting pt scheduled for EGD with Dr. Avram. Pt in agreement to schedule procedure. Appt scheduled for 05/30/2024 at 0900. Discussed instructions with pt including NPO after midnight. Clear liquids until 3 hours prior to arrival. Hold Ozempic x7 days prior to procedure, and that pt will need a driver on the day of the procedure. Per CG in OV note from 05/07/24 pt can stay on his ASA for EGD. Will type instructions and send them via my chart and mail.

## 2024-05-22 ENCOUNTER — Encounter: Payer: Self-pay | Admitting: Internal Medicine

## 2024-05-23 ENCOUNTER — Other Ambulatory Visit: Payer: Self-pay | Admitting: Medical Genetics

## 2024-05-26 ENCOUNTER — Other Ambulatory Visit

## 2024-05-30 ENCOUNTER — Encounter: Payer: Self-pay | Admitting: Internal Medicine

## 2024-05-30 ENCOUNTER — Ambulatory Visit: Admitting: Internal Medicine

## 2024-05-30 ENCOUNTER — Other Ambulatory Visit (INDEPENDENT_AMBULATORY_CARE_PROVIDER_SITE_OTHER)

## 2024-05-30 VITALS — BP 106/64 | HR 80 | Temp 98.3°F | Resp 14 | Ht 66.5 in | Wt 178.0 lb

## 2024-05-30 DIAGNOSIS — K921 Melena: Secondary | ICD-10-CM | POA: Diagnosis not present

## 2024-05-30 DIAGNOSIS — K2970 Gastritis, unspecified, without bleeding: Secondary | ICD-10-CM

## 2024-05-30 DIAGNOSIS — K219 Gastro-esophageal reflux disease without esophagitis: Secondary | ICD-10-CM | POA: Diagnosis not present

## 2024-05-30 DIAGNOSIS — K3189 Other diseases of stomach and duodenum: Secondary | ICD-10-CM

## 2024-05-30 DIAGNOSIS — E538 Deficiency of other specified B group vitamins: Secondary | ICD-10-CM

## 2024-05-30 LAB — VITAMIN B12: Vitamin B-12: 229 pg/mL (ref 211–911)

## 2024-05-30 MED ORDER — SODIUM CHLORIDE 0.9 % IV SOLN: 500.0000 mL | Freq: Once | INTRAVENOUS | Status: DC

## 2024-05-30 NOTE — Patient Instructions (Addendum)
 There are some signs of stomach inflammation I took biopsies to look for the cause, sometimes that can be an infection.  I will let you know those results.  As we discussed your vitamin B12 level was low in February.  I am going to repeat that today to see where it is.  I think the belching symptoms are most likely related to Ozempic which delay stomach emptying.  Ozempic could also be contributing to increasing reflux episodes.  I did not see a particular problem today that would explain it.  Esophagus looks normal is not damaged.  Continue doing what you are doing and after biopsy and lab results return I will make further recommendations.  I appreciate the opportunity to care for you. Calvin CHARLENA Commander, MD, FACG  YOU HAD AN ENDOSCOPIC PROCEDURE TODAY AT THE [facility]:   Refer to the procedure report that was given to you for any specific questions about what was found during the examination.  If the procedure report does not answer your questions, please call your gastroenterologist to clarify.  If you requested that your care partner not be given the details of your procedure findings, then the procedure report has been included in a sealed envelope for you to review at your convenience later.  YOU SHOULD EXPECT: Some feelings of bloating in the abdomen. Passage of more gas than usual.  Walking can help get rid of the air that was put into your GI tract during the procedure and reduce the bloating. If you had a lower endoscopy (such as a colonoscopy or flexible sigmoidoscopy) you may notice spotting of blood in your stool or on the toilet paper. If you underwent a bowel prep for your procedure, you may not have a normal bowel movement for a few days.  Please Note:  You might notice some irritation and congestion in your nose or some drainage.  This is from the oxygen used during your procedure.  There is no need for concern and it should clear up in a day or so.  SYMPTOMS TO REPORT  IMMEDIATELY:  Following upper endoscopy (EGD)  Vomiting of blood or coffee ground material  New chest pain or pain under the shoulder blades  Painful or persistently difficult swallowing  New shortness of breath  Fever of 100F or higher  Black, tarry-looking stools  For urgent or emergent issues, a gastroenterologist can be reached at any hour by calling [phone]. Do not use MyChart messaging for urgent concerns.    DIET:  We do recommend a small meal at first, but then you may proceed to your regular diet.  Drink plenty of fluids but you should avoid alcoholic beverages for 24 hours.  ACTIVITY:  You should plan to take it easy for the rest of today and you should NOT DRIVE or use heavy machinery until tomorrow (because of the sedation medicines used during the test).    FOLLOW UP: Our staff will call the number listed on your records the next business day following your procedure.  We will call around 7:15- 8:00 am to check on you and address any questions or concerns that you may have regarding the information given to you following your procedure. If we do not reach you, we will leave a message.     If any biopsies were taken you will be contacted by phone or by letter within the next 1-3 weeks.  Please call us  at [phone] if you have not heard about the  biopsies in 3 weeks.    SIGNATURES/CONFIDENTIALITY: You and/or your care partner have signed paperwork which will be entered into your electronic medical record.  These signatures attest to the fact that that the information above on your After Visit Summary has been reviewed and is understood.  Full responsibility of the confidentiality of this discharge information lies with you and/or your care-partner.

## 2024-05-30 NOTE — Op Note (Signed)
 [facility] Patient Name: Calvin Smith Procedure Date: 05/30/2024 10:11 AM MRN: [id number] Endoscopist: Lupita FORBES Commander , MD, [id number] Age: 63 Referring MD:  Date of Birth: Apr 05, 1961 Gender: Male Account #: [id number] Procedure:                Upper GI endoscopy Indications:              Esophageal reflux symptoms that persist despite                            appropriate therapy Medicines:                Monitored Anesthesia Care Procedure:                Pre-Anesthesia Assessment:                           - Prior to the procedure, a History and Physical                            was performed, and patient medications and                            allergies were reviewed. The patient's tolerance of                            previous anesthesia was also reviewed. The risks                            and benefits of the procedure and the sedation                            options and risks were discussed with the patient.                            All questions were answered, and informed consent                            was obtained. Prior Anticoagulants: The patient has                            taken no anticoagulant or antiplatelet agents. ASA                            Grade Assessment: III - A patient with severe                            systemic disease. After reviewing the risks and                            benefits, the patient was deemed in satisfactory                            condition to undergo the procedure.  After obtaining informed consent, the endoscope was                            passed under direct vision. Throughout the                            procedure, the patient's blood pressure, pulse, and                            oxygen saturations were monitored continuously. The                            Olympus Scope SN [id number] was introduced through the                            mouth, and advanced to the  second part of duodenum.                            The upper GI endoscopy was accomplished without                            difficulty. The patient tolerated the procedure                            well. Scope In: Scope Out: Findings:                 Diffuse moderate inflammation characterized by                            congestion (edema), erosions and erythema was found                            in the gastric antrum. Biopsies were taken with a                            cold forceps for histology. Verification of patient                            identification for the specimen was done. Estimated                            blood loss was minimal.                           Diffuse mildly erythematous mucosa without bleeding                            was found in the gastric body. Biopsies were taken                            with a cold forceps for histology. Verification of                            patient  identification for the specimen was done.                            Estimated blood loss was minimal.                           The exam was otherwise without abnormality.                           The cardia and gastric fundus were normal on                            retroflexion.                           The gastroesophageal flap valve was visualized                            endoscopically and classified as Hill Grade II                            (fold present, opens with respiration). Complications:            No immediate complications. Estimated Blood Loss:     Estimated blood loss was minimal. Impression:               - Gastritis. Biopsied. [place] used                           - Erythematous mucosa in the gastric body.                            Biopsied. [place] protocol used                           - The examination was otherwise normal.                           - Gastroesophageal flap valve classified as Hill                            Grade  II (fold present, opens with respiration). Recommendation:           - Patient has a contact number available for                            emergencies. The signs and symptoms of potential                            delayed complications were discussed with the                            patient. Return to normal activities tomorrow.                            Written discharge instructions were provided to the  patient.                           - Resume previous diet.                           - Continue present medications. Ozempic could be                            causing belching and increased reflux sx - lately                            nocturnal refluz is better.                           - check B12 level today - it was low in Feb                            [facility]) he has not been on Tx Lupita FORBES Commander, MD 05/30/2024 10:49:42 AM This report has been signed electronically.

## 2024-05-30 NOTE — Progress Notes (Signed)
 To PACU via stretcher, sedated, good respiratory effort, VSS.

## 2024-05-30 NOTE — Progress Notes (Signed)
 History and Physical Interval Note:  05/30/2024 10:20 AM  Calvin Smith  has presented today for endoscopic procedure(s), with the diagnosis of  Encounter Diagnosis  Name Primary?   Gastroesophageal reflux disease, unspecified whether esophagitis present Yes  .  The various methods of evaluation and treatment have been discussed with the patient and/or family. After consideration of risks, benefits and other options for treatment, the patient has consented to  the endoscopic procedure(s).   The patient's history has been reviewed, patient examined, no change in status, stable for endoscopic procedure(s).  I have reviewed the patient's chart and labs.  Questions were answered to the patient's satisfaction.     [name], MD, NOLIA

## 2024-05-30 NOTE — Progress Notes (Signed)
 Pt's states no medical or surgical changes since previsit or office visit.

## 2024-05-30 NOTE — Progress Notes (Signed)
 Called to room to assist during endoscopic procedure.  Patient ID and intended procedure confirmed with present staff. Received instructions for my participation in the procedure from the performing physician.

## 2024-06-02 ENCOUNTER — Ambulatory Visit: Payer: Self-pay | Admitting: Internal Medicine

## 2024-06-02 ENCOUNTER — Telehealth: Payer: Self-pay

## 2024-06-02 ENCOUNTER — Other Ambulatory Visit: Payer: Self-pay | Admitting: Internal Medicine

## 2024-06-02 NOTE — Telephone Encounter (Signed)
  Follow up Call-     05/30/2024    9:38 AM  Call back number  Post procedure Call Back phone  # [phone]  Permission to leave phone message Yes     Patient questions:  Do you have a fever, pain , or abdominal swelling? No. Pain Score  0 *  Have you tolerated food without any problems? Yes.    Have you been able to return to your normal activities? Yes.    Do you have any questions about your discharge instructions: Diet   No. Medications  No. Follow up visit  No.  Do you have questions or concerns about your Care? No.  Actions: * If pain score is 4 or above: No action needed, pain <4.

## 2024-06-03 LAB — SURGICAL PATHOLOGY

## 2024-06-09 ENCOUNTER — Other Ambulatory Visit
Admission: RE | Admit: 2024-06-09 | Discharge: 2024-06-09 | Disposition: A | Discharge status: Home / Self Care | Payer: Self-pay | Source: Ambulatory Visit | Attending: Medical Genetics | Admitting: Medical Genetics

## 2024-06-10 ENCOUNTER — Telehealth: Payer: Self-pay

## 2024-06-10 MED ORDER — PANTOPRAZOLE SODIUM 40 MG PO TBEC: 40.0000 mg | DELAYED_RELEASE_TABLET | Freq: Two times a day (BID) | ORAL | 0 refills | Status: AC

## 2024-06-10 NOTE — Telephone Encounter (Signed)
 Rx sent electronically.

## 2024-06-17 LAB — GENECONNECT MOLECULAR SCREEN: Genetic Analysis Overall Interpretation: NEGATIVE

## 2024-06-19 ENCOUNTER — Other Ambulatory Visit: Payer: Self-pay | Admitting: Gastroenterology

## 2024-06-30 ENCOUNTER — Other Ambulatory Visit (HOSPITAL_COMMUNITY): Payer: Self-pay

## 2024-07-17 ENCOUNTER — Ambulatory Visit

## 2024-07-17 VITALS — BP 94/64 | HR 92 | Temp 97.7°F | Ht 66.5 in | Wt 179.0 lb

## 2024-07-17 DIAGNOSIS — I672 Cerebral atherosclerosis: Secondary | ICD-10-CM

## 2024-07-17 DIAGNOSIS — Z125 Encounter for screening for malignant neoplasm of prostate: Secondary | ICD-10-CM | POA: Diagnosis not present

## 2024-07-17 DIAGNOSIS — I10 Essential (primary) hypertension: Secondary | ICD-10-CM

## 2024-07-17 DIAGNOSIS — K2100 Gastro-esophageal reflux disease with esophagitis, without bleeding: Secondary | ICD-10-CM | POA: Diagnosis not present

## 2024-07-17 DIAGNOSIS — E7849 Other hyperlipidemia: Secondary | ICD-10-CM | POA: Diagnosis not present

## 2024-07-17 DIAGNOSIS — Z23 Encounter for immunization: Secondary | ICD-10-CM

## 2024-07-17 DIAGNOSIS — E1159 Type 2 diabetes mellitus with other circulatory complications: Secondary | ICD-10-CM | POA: Diagnosis not present

## 2024-07-17 DIAGNOSIS — Z794 Long term (current) use of insulin: Secondary | ICD-10-CM

## 2024-07-17 NOTE — Progress Notes (Signed)
 Subjective:   This visit was conducted in person. The patient gave informed consent to the use of Abridge AI technology to record the contents of the encounter as documented below.   Patient ID: Calvin Smith, male    DOB: 12/03/1960, 63 y.o.   MRN: [id number]   Discussed the use of AI scribe software for clinical note transcription with the patient, who gave verbal consent to proceed.  History of Present Illness Calvin Smith is a 62 year old male who presents for a routine follow-up.  He has a history of diabetes and is currently on Ozempic 2 mg weekly, metformin  1000 mg twice daily, Lantus  40 units twice daily, glipizide  two tablets with breakfast, and Jardiance  25 mg daily. He occasionally experiences low blood sugar, particularly in the morning, and uses a Libre sensor to monitor glucose levels. His A1c was recently 7.  He has hypertension and is taking lisinopril  20 mg daily. He does not regularly check his blood pressure at home but has ordered a manual blood pressure monitor. He experiences occasional dizziness, especially with positional changes. He has had four stents placed in both carotid and vertebral arteries.  He has a history of sleep apnea and uses a CPAP machine intermittently. He also experiences acid reflux, for which he takes Pepcid  twice daily. The reflux is particularly bad at night, sometimes causing him to wake up choking.  He has chronic left hip pain due to a previous surgical procedure where the crest of his left hip was removed to reconstruct his radius. The pain is described as arthritic and persistent.  He experiences migraines once every two to three months, which have decreased in frequency compared to when he was younger. He uses Ubrelvy  as needed for headaches.  He takes Lipitor  80 mg daily and aspirin  325 mg daily. He also takes Flomax  daily.  He has a history of allergies and occasionally uses Motrin or Advil as needed for pain relief.  He  has not been taking vitamin B12 supplements despite having them at home. His B12 levels were normal a month ago.  He received a flu shot on May 21, 2024, at [facility] and is up to date on his pneumonia, tetanus, and shingles vaccines.   Review of Systems  All other systems reviewed and are negative.      Allergies[1]  Medications Ordered Prior to Encounter[2]  BP 94/64 (BP Location: Right Arm, Cuff Size: Normal)   Pulse 92   Temp 97.7 F (36.5 C) (Oral)   Ht 5' 6.5 (1.689 m)   Wt 179 lb (81.2 kg)   SpO2 96%   BMI 28.46 kg/m   Objective:      Physical Exam GENERAL: Alert, cooperative, well developed, no acute distress. HEAD: Normocephalic atraumatic. EYES: Extraocular movements intact BL, pupils round, equal and reactive to light BL, conjunctivae normal BL. EXTREMITIES: No cyanosis or edema. NEUROLOGICAL: Oriented to person, place and time, no gait abnormalities, moves all extremities without gross motor or sensory deficit.         Assessment & Plan:   Assessment & Plan Essential hypertension Managed with lisinopril . Low office blood pressure reading 90/60, no symptoms reported. Home monitoring planned for more readings. Will augment tx based on that.  - Continue lisinopril  20 mg daily. - Monitor blood pressure at home daily for one week and during dizziness episodes.   Atherosclerotic cerebrovascular disease History of TIA and carotid stenosis and vertebral artery narrowing with prior splint placements. Occasional  dizziness likely related to cerebrovascular disease. Follows w neurology. Plan to keep BP <130/90, LDL <70 and A1C goal <6.5.   - Continue current doses of ASA and Lipitor    Type 2 diabetes mellitus with circulatory complications Follows with endocrinology, managed with Ozempic, metformin , Lantus , and Jardiance . A1c is 7.0. No severe hypoglycemia. Kidney and liver functions stable.  - Continue Ozempic 2 mg weekly. - Continue metformin  1000 mg  twice daily. - Continue Lantus  40 units twice daily. - Continue Jardiance  25 mg daily. - Monitor blood glucose levels with Libre.   Gastroesophageal reflux disease Managed with famotidine . Recent endoscopy normal. Symptoms may be exacerbated by Ozempic.  Symptoms otherwise well-controlled at this time, no changes.  - Continue famotidine  twice daily for 4-6 weeks, then as needed.  General health maintenance Up to date on flu and shingles vaccines. Pneumonia vaccine updated. Tetanus up to date.  Declined COVID  - Administered updated pneumonia vaccine today. - Confirmed flu shot record from October 15th, 2025.    Return in about 4 months (around 11/17/2024) for CPE, fasting labs 1 wk prior.   Labresha Mellor K Kimberley Dastrup, MD  07/17/2024     Contains text generated by Abridge.        [1]  Allergies Allergen Reactions   Bisoprolol-Hydrochlorothiazide Other (See Comments)     E.D. Unknown per Pt   Codeine Itching  [2]  Current Outpatient Medications on File Prior to Visit  Medication Sig Dispense Refill   aspirin  EC 325 MG tablet Take 1 tablet (325 mg total) by mouth daily. 30 tablet 0   atorvastatin  (LIPITOR ) 80 MG tablet TAKE 1 TABLET BY MOUTH DAILY 90 tablet 3   BD PEN NEEDLE NANO ULTRAFINE 32G X 4 MM MISC USE ONCE DAILY 90 each 3   CONTOUR NEXT TEST test strip USE TO CHECK BLOOD SUGAR DAILY 100 strip 3   cyanocobalamin  1000 MCG tablet Take 1,000 mcg by mouth daily.     empagliflozin  (JARDIANCE ) 25 MG TABS tablet Take 1 tablet (25 mg total) by mouth daily. 90 tablet 1   famotidine  (PEPCID ) 20 MG tablet TAKE ONE (1) TABLET BY MOUTH TWO TIMES PER DAY 30 tablet 3   glipiZIDE  (GLUCOTROL  XL) 5 MG 24 hr tablet TAKE 1 TABLET BY MOUTH TWICE  DAILY (Patient taking differently: Take 10 mg by mouth daily with breakfast.) 180 tablet 3   ibuprofen (ADVIL) 200 MG tablet Take 445 mg by mouth every 8 (eight) hours as needed for headache.     insulin  glargine (LANTUS  SOLOSTAR) 100 UNIT/ML Solostar Pen  INJECT SUBCUTANEOUSLY 90 UNITS  AT BEDTIME 90 mL 2   lisinopril  (ZESTRIL ) 20 MG tablet Take 1 tablet (20 mg total) by mouth daily. 90 tablet 1   metFORMIN  (GLUCOPHAGE ) 1000 MG tablet TAKE 1 TABLET BY MOUTH TWICE  DAILY WITH MEALS 180 tablet 3   pantoprazole  (PROTONIX ) 40 MG tablet Take 1 tablet (40 mg total) by mouth 2 (two) times daily. 180 tablet 0   Semaglutide, 2 MG/DOSE, (OZEMPIC, 2 MG/DOSE,) 8 MG/3ML SOPN Inject 2 mg into the skin every Saturday.     tamsulosin  (FLOMAX ) 0.4 MG CAPS capsule TAKE 1 CAPSULE BY MOUTH ONCE DAILY 90 capsule 3   Ubrogepant  (UBRELVY ) 100 MG TABS TAKE 1 TABLET BY MOUTH AT ONSET OF HEADACHE, MAY REPEAT IN 2 HOURS IF NEEDED. MAX OF 2 TABS/24 HOURS 30 tablet 3   No current facility-administered medications on file prior to visit.

## 2024-07-17 NOTE — Patient Instructions (Addendum)
 Thank you for visiting [facility] today! Here's what we talked about: - START checking blood pressure daily and send readings to me, also check BP whenever dizzy - START taking B12 supplement

## 2024-07-30 ENCOUNTER — Other Ambulatory Visit: Payer: Self-pay

## 2024-08-01 MED ORDER — CONTOUR NEXT TEST VI STRP: ORAL_STRIP | 3 refills | Status: AC

## 2024-08-01 MED ORDER — METFORMIN HCL 1000 MG PO TABS: 1000.0000 mg | ORAL_TABLET | Freq: Two times a day (BID) | ORAL | 3 refills | Status: AC

## 2024-08-12 ENCOUNTER — Other Ambulatory Visit: Payer: Self-pay | Admitting: *Deleted

## 2024-08-19 ENCOUNTER — Telehealth: Payer: Self-pay | Admitting: Pharmacist

## 2024-08-19 NOTE — Telephone Encounter (Signed)
 Pharmacy Patient Advocate Encounter  Received notification from [employer] that Prior Authorization for Ubrogepant  (UBRELVY ) 100 MG TABS Medication Details & History has been APPROVED from 08/19/2024 to 08/19/2026   PA #/Case ID/Reference #: [id number]

## 2024-08-19 NOTE — Telephone Encounter (Signed)
 Pharmacy Patient Advocate Encounter   Received notification from [facility] that prior authorization for Ubrelvy  100MG  tablets is required/requested.   Insurance verification completed.   The patient is insured through [facility].   Per test claim: PA required; PA submitted to above mentioned insurance via Latent Key/confirmation #/EOC [id number] Status is pending

## 2024-08-31 MED ORDER — TAMSULOSIN HCL 0.4 MG PO CAPS: 0.4000 mg | ORAL_CAPSULE | Freq: Every day | ORAL | 3 refills | Status: AC

## 2024-09-05 ENCOUNTER — Other Ambulatory Visit: Payer: Self-pay

## 2024-09-05 NOTE — Telephone Encounter (Signed)
 SABRA

## 2024-09-06 MED ORDER — FAMOTIDINE 20 MG PO TABS: 20.0000 mg | ORAL_TABLET | Freq: Two times a day (BID) | ORAL | 0 refills | Status: AC

## 2024-09-11 LAB — OPHTHALMOLOGY REPORT-SCANNED

## 2024-11-10 ENCOUNTER — Other Ambulatory Visit

## 2024-11-17 ENCOUNTER — Encounter

## 2024-12-18 ENCOUNTER — Ambulatory Visit: Admitting: Neurology
